# Patient Record
Sex: Female | Born: 1955 | Race: White | Hispanic: No | Smoking: Never smoker
Health system: Southern US, Community
[De-identification: ages and names within clinical notes are randomized; demographics above are authoritative.]

## PROBLEM LIST (undated history)

## (undated) DIAGNOSIS — R911 Solitary pulmonary nodule: Secondary | ICD-10-CM

## (undated) DIAGNOSIS — M48061 Spinal stenosis, lumbar region without neurogenic claudication: Secondary | ICD-10-CM

## (undated) DIAGNOSIS — F419 Anxiety disorder, unspecified: Secondary | ICD-10-CM

## (undated) DIAGNOSIS — Z8489 Family history of other specified conditions: Secondary | ICD-10-CM

## (undated) DIAGNOSIS — Z7982 Long term (current) use of aspirin: Secondary | ICD-10-CM

## (undated) DIAGNOSIS — I872 Venous insufficiency (chronic) (peripheral): Secondary | ICD-10-CM

## (undated) DIAGNOSIS — E559 Vitamin D deficiency, unspecified: Secondary | ICD-10-CM

## (undated) DIAGNOSIS — I8390 Asymptomatic varicose veins of unspecified lower extremity: Secondary | ICD-10-CM

## (undated) DIAGNOSIS — I700 Atherosclerosis of aorta: Secondary | ICD-10-CM

## (undated) DIAGNOSIS — L4050 Arthropathic psoriasis, unspecified: Secondary | ICD-10-CM

## (undated) DIAGNOSIS — K635 Polyp of colon: Secondary | ICD-10-CM

## (undated) DIAGNOSIS — K219 Gastro-esophageal reflux disease without esophagitis: Secondary | ICD-10-CM

## (undated) DIAGNOSIS — K8020 Calculus of gallbladder without cholecystitis without obstruction: Secondary | ICD-10-CM

## (undated) DIAGNOSIS — Z86018 Personal history of other benign neoplasm: Secondary | ICD-10-CM

## (undated) DIAGNOSIS — R1310 Dysphagia, unspecified: Secondary | ICD-10-CM

## (undated) DIAGNOSIS — M51369 Other intervertebral disc degeneration, lumbar region without mention of lumbar back pain or lower extremity pain: Secondary | ICD-10-CM

## (undated) DIAGNOSIS — L409 Psoriasis, unspecified: Secondary | ICD-10-CM

## (undated) DIAGNOSIS — Z7961 Long term (current) use of immunomodulator: Secondary | ICD-10-CM

## (undated) DIAGNOSIS — E785 Hyperlipidemia, unspecified: Secondary | ICD-10-CM

## (undated) DIAGNOSIS — M1990 Unspecified osteoarthritis, unspecified site: Secondary | ICD-10-CM

## (undated) DIAGNOSIS — I2510 Atherosclerotic heart disease of native coronary artery without angina pectoris: Secondary | ICD-10-CM

## (undated) DIAGNOSIS — R931 Abnormal findings on diagnostic imaging of heart and coronary circulation: Secondary | ICD-10-CM

## (undated) DIAGNOSIS — M519 Unspecified thoracic, thoracolumbar and lumbosacral intervertebral disc disorder: Secondary | ICD-10-CM

## (undated) HISTORY — DX: Psoriasis, unspecified: L40.9

## (undated) HISTORY — PX: FOOT SURGERY: SHX648

## (undated) HISTORY — DX: Personal history of other benign neoplasm: Z86.018

## (undated) HISTORY — DX: Gastro-esophageal reflux disease without esophagitis: K21.9

## (undated) HISTORY — PX: VARICOSE VEIN SURGERY: SHX832

## (undated) HISTORY — PX: COLONOSCOPY WITH ESOPHAGOGASTRODUODENOSCOPY (EGD): SHX5779

## (undated) HISTORY — DX: Polyp of colon: K63.5

## (undated) HISTORY — PX: SACROILIAC JOINT INJECTION: SHX2370

## (undated) HISTORY — PX: SIGMOIDOSCOPY: SUR1295

## (undated) HISTORY — PX: ABDOMINAL HYSTERECTOMY: SHX81

---

## 1995-04-09 HISTORY — PX: UTERINE FIBROID SURGERY: SHX826

## 1997-04-08 HISTORY — PX: BREAST SURGERY: SHX581

## 1999-06-18 ENCOUNTER — Encounter: Payer: Self-pay | Admitting: Family Medicine

## 1999-06-18 ENCOUNTER — Encounter: Admission: RE | Admit: 1999-06-18 | Discharge: 1999-06-18 | Payer: Self-pay | Admitting: Family Medicine

## 2000-09-02 ENCOUNTER — Encounter: Payer: Self-pay | Admitting: Obstetrics and Gynecology

## 2000-09-02 ENCOUNTER — Encounter: Admission: RE | Admit: 2000-09-02 | Discharge: 2000-09-02 | Payer: Self-pay | Admitting: Obstetrics and Gynecology

## 2000-09-08 ENCOUNTER — Encounter: Payer: Self-pay | Admitting: Obstetrics and Gynecology

## 2000-09-08 ENCOUNTER — Encounter: Admission: RE | Admit: 2000-09-08 | Discharge: 2000-09-08 | Payer: Self-pay | Admitting: Obstetrics and Gynecology

## 2001-09-09 ENCOUNTER — Encounter: Admission: RE | Admit: 2001-09-09 | Discharge: 2001-09-09 | Payer: Self-pay | Admitting: Obstetrics and Gynecology

## 2001-09-09 ENCOUNTER — Encounter: Payer: Self-pay | Admitting: Obstetrics and Gynecology

## 2002-10-05 ENCOUNTER — Encounter: Admission: RE | Admit: 2002-10-05 | Discharge: 2002-10-05 | Payer: Self-pay | Admitting: Obstetrics and Gynecology

## 2002-10-05 ENCOUNTER — Encounter: Payer: Self-pay | Admitting: Obstetrics and Gynecology

## 2003-04-09 HISTORY — PX: OTHER SURGICAL HISTORY: SHX169

## 2003-11-04 ENCOUNTER — Encounter: Admission: RE | Admit: 2003-11-04 | Discharge: 2003-11-04 | Payer: Self-pay | Admitting: Obstetrics and Gynecology

## 2004-10-25 ENCOUNTER — Ambulatory Visit: Payer: Self-pay | Admitting: Unknown Physician Specialty

## 2005-09-27 ENCOUNTER — Ambulatory Visit: Payer: Self-pay | Admitting: Internal Medicine

## 2010-06-18 ENCOUNTER — Ambulatory Visit: Payer: Self-pay | Admitting: Internal Medicine

## 2012-01-31 ENCOUNTER — Telehealth: Payer: Self-pay | Admitting: Internal Medicine

## 2012-01-31 NOTE — Telephone Encounter (Signed)
Dr. Scott called this into the pharmacy with 1 refill. °

## 2012-01-31 NOTE — Telephone Encounter (Signed)
Refill request for alprazolam odt 0.25 mg tab Sig: place 1 tablet under tongue every day as needed Patient has an appt on 04/28/12

## 2012-01-31 NOTE — Telephone Encounter (Signed)
Ok x 1

## 2012-02-04 ENCOUNTER — Other Ambulatory Visit: Payer: Self-pay | Admitting: *Deleted

## 2012-03-11 ENCOUNTER — Telehealth: Payer: Self-pay | Admitting: Internal Medicine

## 2012-03-11 NOTE — Telephone Encounter (Signed)
Pt is needing refill on Pantoprazole and she uses [facility] on [address]

## 2012-03-11 NOTE — Telephone Encounter (Signed)
Ok to refill.  protonix 40mg  until her appt but will need to clarify how she takes (either once or twice a day)

## 2012-03-12 MED ORDER — PANTOPRAZOLE SODIUM 40 MG PO TBEC: 40.0000 mg | DELAYED_RELEASE_TABLET | Freq: Every day | ORAL | Status: DC

## 2012-03-12 NOTE — Telephone Encounter (Signed)
Called for script verification;no answer

## 2012-03-12 NOTE — Telephone Encounter (Signed)
Called prescription in to pharmacy 

## 2012-03-12 NOTE — Addendum Note (Signed)
Addended by: Marlene Lard on: 03/12/2012 10:58 AM   Modules accepted: Orders

## 2012-04-28 ENCOUNTER — Encounter: Payer: Self-pay | Admitting: *Deleted

## 2012-04-28 ENCOUNTER — Encounter: Payer: Self-pay | Admitting: Internal Medicine

## 2012-04-28 ENCOUNTER — Ambulatory Visit (INDEPENDENT_AMBULATORY_CARE_PROVIDER_SITE_OTHER): Payer: BC Managed Care – PPO | Admitting: Internal Medicine

## 2012-04-28 VITALS — BP 118/70 | HR 66 | Temp 98.1°F | Ht 62.0 in | Wt 135.0 lb

## 2012-04-28 DIAGNOSIS — Z8619 Personal history of other infectious and parasitic diseases: Secondary | ICD-10-CM

## 2012-04-28 DIAGNOSIS — Z23 Encounter for immunization: Secondary | ICD-10-CM

## 2012-04-28 DIAGNOSIS — R5383 Other fatigue: Secondary | ICD-10-CM

## 2012-04-28 DIAGNOSIS — R5381 Other malaise: Secondary | ICD-10-CM

## 2012-04-28 DIAGNOSIS — K219 Gastro-esophageal reflux disease without esophagitis: Secondary | ICD-10-CM

## 2012-04-28 DIAGNOSIS — Z139 Encounter for screening, unspecified: Secondary | ICD-10-CM

## 2012-04-28 DIAGNOSIS — Z9109 Other allergy status, other than to drugs and biological substances: Secondary | ICD-10-CM

## 2012-04-28 MED ORDER — PANTOPRAZOLE SODIUM 40 MG PO TBEC: 40.0000 mg | DELAYED_RELEASE_TABLET | Freq: Every day | ORAL | Status: DC

## 2012-05-03 ENCOUNTER — Encounter: Payer: Self-pay | Admitting: Internal Medicine

## 2012-05-03 DIAGNOSIS — Z9109 Other allergy status, other than to drugs and biological substances: Secondary | ICD-10-CM | POA: Insufficient documentation

## 2012-05-03 DIAGNOSIS — K219 Gastro-esophageal reflux disease without esophagitis: Secondary | ICD-10-CM | POA: Insufficient documentation

## 2012-05-03 DIAGNOSIS — Z8619 Personal history of other infectious and parasitic diseases: Secondary | ICD-10-CM | POA: Insufficient documentation

## 2012-05-03 NOTE — Assessment & Plan Note (Signed)
Following with Dr Knowles- Jonas.  Up to date.  See above.    

## 2012-05-03 NOTE — Assessment & Plan Note (Signed)
On protonix.  Avoid foods that aggravate.  Keep follow up appt with Dr Andrey Campanile.

## 2012-05-03 NOTE — Progress Notes (Signed)
Subjective:    Patient ID: April Santos, female    DOB: [birth year], 57 y.o.   MRN: [id number]  HPI 57 year old female with past history of fibroid tumors s/p hysterectomy followed by Dr Toya Smothers. She also has a history of reoccurring allergy and sinus issues.  Seeing Dr  Callas.  Just had repeat patch testing in 8/13 - secondary to a flare.  Using eye drops, nasal spray and an antihistamine.  She continues to have some issues with her right knee.  Seeing Dr Zachery Dauer.  Taking Etodolac daily (1x/day).  Has a muscle relaxer which she takes as needed.  Had an injection over the summer.  Has follow up with Dr Andrey Campanile in 3/14.  She is on protonix.  Still has an occasional flare if she eats the wrong thing.  Bowels stable.    Past Medical History  Diagnosis Date  . GERD (gastroesophageal reflux disease)   . Colon polyps   . History of uterine fibroid     Current Outpatient Prescriptions on File Prior to Visit  Medication Sig Dispense Refill  . Calcium Citrate (CITRACAL PO) Take by mouth 4 (four) times daily.      Marland Kitchen estradiol (ESTRACE) 1 MG tablet Take 1 mg by mouth daily.      . fluticasone (FLONASE) 50 MCG/ACT nasal spray Place 2 sprays into the nose as needed.      Marland Kitchen levocetirizine (XYZAL) 5 MG tablet Take 5 mg by mouth every evening.      . Olopatadine HCl (PATANASE) 0.6 % SOLN Place into the nose as needed.      . pantoprazole (PROTONIX) 40 MG tablet Take 1 tablet (40 mg total) by mouth daily.  30 tablet  4    Review of Systems Patient denies any headache, lightheadedness or dizziness.  No significant sinus symptoms currently.  No chest pain, tightness or palpitations.  No increased shortness of breath, cough or congestion.  No nausea or vomiting.  Acid reflux if she eats the wrong thing.  On protonix.  No abdominal pain or cramping.  No bowel change, such as diarrhea, constipation, BRBPR or melana.  No urine change.        Objective:   Physical Exam Filed Vitals:   04/28/12 1328    BP: 118/70  Pulse: 66  Temp: 98.1 F (42.6 C)   57 year old female in no acute distress.   HEENT:  Nares- clear.  Oropharynx - without lesions. NECK:  Supple.  Nontender.  No audible bruit.  HEART:  Appears to be regular. LUNGS:  No crackles or wheezing audible.  Respirations even and unlabored.  RADIAL PULSE:  Equal bilaterally.  ABDOMEN:  Soft, nontender.  Bowel sounds present and normal.  No audible abdominal bruit.  EXTREMITIES:  No increased edema present.  DP pulses palpable and equal bilaterally.           Assessment & Plan:  GYN.  Sees Dr Toya Smothers.  Last evaluated two weeks ago.  Obtain records.    INCREASED PSYCHOSOCIAL STRESSORS. Doing well.  Follow.  Takes an occasional xanax.   GI.  Last colonoscopy 07/11/05.  Previous bowel issues.  See previous [facility] notes for details.  Due to follow up with Dr Andrey Campanile in 3/14.    MSK.  Knee pain as outlined.  Seeing Dr Zachery Dauer.    HEALTH MAINTENANCE.  Breast, pelvic and pap smears through Dr Toya Smothers office.  Obtain records.  Obtain mammo results.  Colonoscopy 07/11/05 - with  diverticulosis.  Has follow up already scheduled with Dr Andrey Campanile.

## 2012-05-03 NOTE — Assessment & Plan Note (Signed)
Seeing Dr Marietta Callas.  Continue current med regimen.

## 2012-05-07 ENCOUNTER — Other Ambulatory Visit: Payer: Self-pay | Admitting: *Deleted

## 2012-05-08 NOTE — Telephone Encounter (Signed)
Medication has already been sent in. 

## 2012-05-12 ENCOUNTER — Other Ambulatory Visit: Payer: BC Managed Care – PPO

## 2012-06-12 ENCOUNTER — Other Ambulatory Visit: Payer: BC Managed Care – PPO

## 2012-06-17 ENCOUNTER — Telehealth: Payer: Self-pay | Admitting: *Deleted

## 2012-06-17 NOTE — Telephone Encounter (Signed)
Called [phone] for prior authorization for the Pantoprazole 40 mg, form is being faxed over now

## 2012-06-25 ENCOUNTER — Telehealth: Payer: Self-pay | Admitting: *Deleted

## 2012-06-25 NOTE — Telephone Encounter (Signed)
April Santos called the office and she said her insurance needs a prior auth to cover the Protonix 40 mg

## 2012-06-26 ENCOUNTER — Other Ambulatory Visit: Payer: Self-pay | Admitting: *Deleted

## 2012-07-31 ENCOUNTER — Telehealth: Payer: Self-pay | Admitting: *Deleted

## 2012-07-31 NOTE — Telephone Encounter (Signed)
[place] for [id number]

## 2012-07-31 NOTE — Telephone Encounter (Signed)
Called [phone] for prior authorization in the Pantoprazole 40 mg, form is being faxed over now

## 2012-08-03 ENCOUNTER — Telehealth: Payer: Self-pay | Admitting: *Deleted

## 2012-08-03 NOTE — Telephone Encounter (Signed)
Prior authorization criteria question have been faxed over

## 2012-08-28 ENCOUNTER — Other Ambulatory Visit: Payer: Self-pay | Admitting: *Deleted

## 2012-08-28 ENCOUNTER — Telehealth: Payer: Self-pay | Admitting: Internal Medicine

## 2012-08-28 MED ORDER — ALPRAZOLAM 0.25 MG PO TABS: 0.2500 mg | ORAL_TABLET | Freq: Every evening | ORAL | Status: DC | PRN

## 2012-08-28 NOTE — Telephone Encounter (Signed)
Okay to refill? 

## 2012-08-28 NOTE — Telephone Encounter (Signed)
Refilled Xanax x 1 per Dr. Lorin Picket

## 2012-08-28 NOTE — Telephone Encounter (Signed)
ALPRAZolam (XANAX) 0.25 MG tablet #30

## 2012-08-28 NOTE — Telephone Encounter (Signed)
Patient wanting to know if the insurance company will allow her Pantoprazole 40 mg to be filled or is there some other medication she can take in its place.

## 2012-08-28 NOTE — Telephone Encounter (Signed)
Ok x 1

## 2012-09-16 ENCOUNTER — Telehealth: Payer: Self-pay | Admitting: *Deleted

## 2012-09-16 NOTE — Telephone Encounter (Signed)
Spoke with pt re: Pantoprazole 40mg . She has been paying for the medication out of pocket. Looks like we were working on a prior authorization for her and haven't heard anything back. I mentioned to the patient that she may have to try Omeprazole 40 mg first since she states that she has never tried it before. Please Advise.  Note: Pt now uses [facility]

## 2012-09-17 ENCOUNTER — Other Ambulatory Visit: Payer: BC Managed Care – PPO

## 2012-09-17 NOTE — Telephone Encounter (Signed)
Do you know if they are going to cover her protonix.  Have they sent back a reason - if they are denying.  Does she have to try something else?   Just let me know.  Thanks.

## 2012-10-26 ENCOUNTER — Other Ambulatory Visit: Payer: BC Managed Care – PPO

## 2012-10-27 ENCOUNTER — Other Ambulatory Visit: Payer: BC Managed Care – PPO

## 2012-10-27 ENCOUNTER — Ambulatory Visit: Payer: BC Managed Care – PPO | Admitting: Internal Medicine

## 2012-11-23 ENCOUNTER — Encounter: Payer: Self-pay | Admitting: Internal Medicine

## 2012-11-24 ENCOUNTER — Ambulatory Visit (INDEPENDENT_AMBULATORY_CARE_PROVIDER_SITE_OTHER): Payer: BC Managed Care – PPO | Admitting: Internal Medicine

## 2012-11-24 ENCOUNTER — Encounter: Payer: Self-pay | Admitting: Internal Medicine

## 2012-11-24 ENCOUNTER — Other Ambulatory Visit (INDEPENDENT_AMBULATORY_CARE_PROVIDER_SITE_OTHER): Payer: BC Managed Care – PPO

## 2012-11-24 VITALS — BP 100/60 | HR 55 | Temp 98.1°F | Ht 62.0 in | Wt 132.5 lb

## 2012-11-24 DIAGNOSIS — G5600 Carpal tunnel syndrome, unspecified upper limb: Secondary | ICD-10-CM

## 2012-11-24 DIAGNOSIS — Z8619 Personal history of other infectious and parasitic diseases: Secondary | ICD-10-CM

## 2012-11-24 DIAGNOSIS — Z139 Encounter for screening, unspecified: Secondary | ICD-10-CM

## 2012-11-24 DIAGNOSIS — K219 Gastro-esophageal reflux disease without esophagitis: Secondary | ICD-10-CM

## 2012-11-24 DIAGNOSIS — R5381 Other malaise: Secondary | ICD-10-CM

## 2012-11-24 DIAGNOSIS — Z9109 Other allergy status, other than to drugs and biological substances: Secondary | ICD-10-CM

## 2012-11-24 DIAGNOSIS — R5383 Other fatigue: Secondary | ICD-10-CM

## 2012-11-24 LAB — COMPREHENSIVE METABOLIC PANEL
ALT: 13 U/L (ref 0–35)
AST: 21 U/L (ref 0–37)
CO2: 26 mEq/L (ref 19–32)
Calcium: 9 mg/dL (ref 8.4–10.5)
Chloride: 109 mEq/L (ref 96–112)
Creatinine, Ser: 0.8 mg/dL (ref 0.4–1.2)
GFR: 78.17 mL/min (ref >60.00)
Potassium: 4.1 mEq/L (ref 3.5–5.1)
Sodium: 140 mEq/L (ref 135–145)
Total Protein: 6.7 g/dL (ref 6.0–8.3)

## 2012-11-24 LAB — CBC WITH DIFFERENTIAL/PLATELET
Basophils Absolute: 0 10*3/uL (ref 0.0–0.1)
Eosinophils Absolute: 0 10*3/uL (ref 0.0–0.7)
Hemoglobin: 11.4 g/dL — ABNORMAL LOW (ref 12.0–15.0)
Lymphocytes Relative: 42.8 % (ref 12.0–46.0)
MCHC: 33.5 g/dL (ref 30.0–36.0)
Monocytes Relative: 10.5 % (ref 3.0–12.0)
Neutrophils Relative %: 45.3 % (ref 43.0–77.0)
Platelets: 279 10*3/uL (ref 150.0–400.0)
RDW: 13.4 % (ref 11.5–14.6)

## 2012-11-24 LAB — LIPID PANEL
LDL Cholesterol: 83 mg/dL (ref 0–99)
Total CHOL/HDL Ratio: 3
Triglycerides: 55 mg/dL (ref 0.0–149.0)

## 2012-11-24 MED ORDER — CYCLOBENZAPRINE HCL 5 MG PO TABS: 5.0000 mg | ORAL_TABLET | Freq: Every evening | ORAL | Status: DC | PRN

## 2012-11-24 NOTE — Progress Notes (Signed)
Subjective:    Patient ID: April Santos, female    DOB: 1956/02/12, 57 y.o.   MRN: [id number]  HPI 57 year old female with past history of fibroid tumors s/p hysterectomy followed by Dr Toya Smothers. She also has a history of reoccurring allergy and sinus issues.  Seeing Dr Riverlea Callas.  Just had repeat patch testing in 8/13 - secondary to a flare.  Uses a nasal spray and an antihistamine.  She has also noticed some itching and dryness/rash - hands/knuckles.  Has tried multiple creams.  Planning to se Dr Kerr Callas today to discuss.  She continues to have some issues with her right knee, but is better.  Was  seeing Dr Zachery Dauer.  Taking Etodolac daily, usually only needs one every other week.  Has a muscle relaxer which she takes as needed.  Had an injection over the summer.  Again, better.  She is on protonix.  Still has an occasional flare if she eats the wrong thing.  Bowels stable.  Was due to follow up with Dr Andrey Campanile this spring.  She had to change her appt (secondary to work).  Reports would be closer for her to be evaluated in [place].  She rides her bike five days per week.  Breathing stable.  She has noticed some left hand numbness.  Has a splint.  Helps.  Has required carpal tunnel surgery on her right hand.  States was told eventually would need on her left.      Past Medical History  Diagnosis Date  . GERD (gastroesophageal reflux disease)   . Colon polyps   . History of uterine fibroid     Current Outpatient Prescriptions on File Prior to Visit  Medication Sig Dispense Refill  . ALPRAZolam (XANAX) 0.25 MG tablet Take 1 tablet (0.25 mg total) by mouth at bedtime as needed.  30 tablet  0  . Bepotastine Besilate (BEPREVE) 1.5 % SOLN as needed.      . cyclobenzaprine (FLEXERIL) 5 MG tablet Take 5 mg by mouth at bedtime as needed.       Marland Kitchen estradiol (ESTRACE) 0.1 MG/GM vaginal cream Place 2 g vaginally 2 (two) times a week.      . estradiol (ESTRACE) 1 MG tablet Take 1 mg by mouth daily.      Marland Kitchen  etodolac (LODINE) 500 MG tablet Take 500 mg by mouth 2 (two) times daily as needed.      . fluticasone (FLONASE) 50 MCG/ACT nasal spray Place 2 sprays into the nose as needed.      . hydrocortisone 2.5 % cream Apply topically as needed.      . Hydrocortisone Ace-Pramoxine 2.35-1 % KIT Place rectally.      Marland Kitchen levocetirizine (XYZAL) 5 MG tablet Take 5 mg by mouth every evening.      . Multiple Vitamins-Minerals (MULTIVITAL PO) Take by mouth daily.      . Olopatadine HCl (PATANASE) 0.6 % SOLN Place into the nose as needed.      . pantoprazole (PROTONIX) 40 MG tablet Take 1 tablet (40 mg total) by mouth daily.  30 tablet  4  . Calcium Citrate (CITRACAL PO) Take by mouth 4 (four) times daily.       No current facility-administered medications on file prior to visit.    Review of Systems Patient denies any headache, lightheadedness or dizziness.  No significant sinus symptoms currently.  No chest pain, tightness or palpitations.  No increased shortness of breath, cough or congestion.  No nausea  or vomiting.  Acid reflux if she eats the wrong thing.  On protonix.  No abdominal pain or cramping.  No bowel change, such as diarrhea, constipation, BRBPR or melana.  No urine change.  Itching and rash (knuckles) as outlined.  Knee better.       Objective:   Physical Exam  Filed Vitals:   11/24/12 0824  BP: 100/60  Pulse: 55  Temp: 98.1 F (11.11 C)   57 year old female in no acute distress.   HEENT:  Nares- clear.  Oropharynx - without lesions. NECK:  Supple.  Nontender.  No audible bruit.  HEART:  Appears to be regular. LUNGS:  No crackles or wheezing audible.  Respirations even and unlabored.  RADIAL PULSE:  Equal bilaterally.  ABDOMEN:  Soft, nontender.  Bowel sounds present and normal.  No audible abdominal bruit.  EXTREMITIES:  No increased edema present.  DP pulses palpable and equal bilaterally.   MSK:  Good rom - knee.  No increased swelling.   SKIN:  Minimal erythema and dryness -  knuckles.           Assessment & Plan:  GYN.  Sees Dr Toya Smothers.  Up to date.    INCREASED PSYCHOSOCIAL STRESSORS. Doing well.  Follow.  Takes an occasional xanax.   GI.  Last colonoscopy 07/11/05.  Previous bowel issues.  See previous [facility] notes for details.  See above. Prefers to be seen in [place].  Will schedule with GI.  Referral made.    MSK.  Knee better.  See above.     HEALTH MAINTENANCE.  Breast, pelvic and pap smears through Dr Toya Smothers office.  Obtain records.  Mammogram 04/10/12 - Birads I.  Colonoscopy 07/11/05 - with diverticulosis.  Refer to GI as outlined.

## 2012-11-24 NOTE — Assessment & Plan Note (Addendum)
On protonix.  Avoid foods that aggravate.  Refer to GI in [place].  See notes.

## 2012-11-24 NOTE — Assessment & Plan Note (Addendum)
Seeing Dr Rutland Callas.  Continue current med regimen.  Having the rash and itching as outlined.  Seeing Dr [place] Callas today.  Has already tried various creams (including steroid creams) and antihistamines.

## 2012-11-24 NOTE — Assessment & Plan Note (Signed)
Following with Dr Knowles- Jonas.  Up to date.  See above.    

## 2012-11-25 LAB — VITAMIN D 25 HYDROXY (VIT D DEFICIENCY, FRACTURES): Vit D, 25-Hydroxy: 45 ng/mL (ref 30–89)

## 2012-11-26 ENCOUNTER — Other Ambulatory Visit: Payer: Self-pay | Admitting: Internal Medicine

## 2012-11-26 ENCOUNTER — Encounter: Payer: Self-pay | Admitting: Internal Medicine

## 2012-11-26 ENCOUNTER — Encounter: Payer: Self-pay | Admitting: *Deleted

## 2012-11-26 DIAGNOSIS — D649 Anemia, unspecified: Secondary | ICD-10-CM

## 2012-11-26 DIAGNOSIS — G5600 Carpal tunnel syndrome, unspecified upper limb: Secondary | ICD-10-CM | POA: Insufficient documentation

## 2012-11-26 NOTE — Assessment & Plan Note (Signed)
Is s/p surgery on right.  Has done well.  Numbness in left hand - mostly noticed when gripping (riding her bike).  Splint helps.  Will wear on a more regular schedule.  Follow.  Will notify me if desires any further intervention.

## 2012-11-26 NOTE — Progress Notes (Signed)
Orders placed for f/u las.   

## 2012-12-14 ENCOUNTER — Encounter: Payer: Self-pay | Admitting: Internal Medicine

## 2012-12-15 ENCOUNTER — Other Ambulatory Visit: Payer: BC Managed Care – PPO

## 2013-01-06 ENCOUNTER — Other Ambulatory Visit (INDEPENDENT_AMBULATORY_CARE_PROVIDER_SITE_OTHER): Payer: BC Managed Care – PPO

## 2013-01-06 DIAGNOSIS — D649 Anemia, unspecified: Secondary | ICD-10-CM

## 2013-01-06 LAB — CBC WITH DIFFERENTIAL/PLATELET
Basophils Absolute: 0 10*3/uL (ref 0.0–0.1)
Basophils Relative: 0.4 % (ref 0.0–3.0)
Eosinophils Absolute: 0 10*3/uL (ref 0.0–0.7)
MCHC: 33.6 g/dL (ref 30.0–36.0)
MCV: 83.4 fl (ref 78.0–100.0)
Monocytes Absolute: 0.4 10*3/uL (ref 0.1–1.0)
Neutrophils Relative %: 52 % (ref 43.0–77.0)
RBC: 4.48 Mil/uL (ref 3.87–5.11)
RDW: 14.3 % (ref 11.5–14.6)

## 2013-01-07 LAB — IBC PANEL: Iron: 97 ug/dL (ref 42–145)

## 2013-01-11 ENCOUNTER — Encounter: Payer: Self-pay | Admitting: *Deleted

## 2013-01-22 ENCOUNTER — Telehealth: Payer: Self-pay | Admitting: Internal Medicine

## 2013-01-22 NOTE — Telephone Encounter (Signed)
noted 

## 2013-01-22 NOTE — Telephone Encounter (Signed)
Patient has seen Dr. Loreta Ave, GI. She states she was s/u for a colonoscopy, but they are expecting 700.00 up front. They will not offer a payment plan, pt is willing to r/s this apt if they can r/s to Jan., to give her time to gather the money. Patient is wondering if she should wait for colonoscopy in Jan., or should there be something else she should do in the meantime. Pt is wondering if maybe she should change GI doctors as well. Please advise.

## 2013-01-22 NOTE — Telephone Encounter (Signed)
Patient called back and stated she has gotten some clarification on why its costing so much. They are doing a colon and EGD as well. She is r/s with their office towards the end of Jan. No need to call her back.

## 2013-02-02 ENCOUNTER — Ambulatory Visit: Payer: BC Managed Care – PPO

## 2013-02-02 DIAGNOSIS — Z23 Encounter for immunization: Secondary | ICD-10-CM

## 2013-02-10 ENCOUNTER — Other Ambulatory Visit: Payer: Self-pay | Admitting: *Deleted

## 2013-02-10 MED ORDER — ALPRAZOLAM 0.25 MG PO TABS: 0.2500 mg | ORAL_TABLET | Freq: Every evening | ORAL | Status: DC | PRN

## 2013-02-10 NOTE — Telephone Encounter (Signed)
Refilled xanax #30 with no refills.   

## 2013-02-12 ENCOUNTER — Other Ambulatory Visit: Payer: Self-pay | Admitting: *Deleted

## 2013-02-12 MED ORDER — CYCLOBENZAPRINE HCL 5 MG PO TABS: 5.0000 mg | ORAL_TABLET | Freq: Every evening | ORAL | Status: DC | PRN

## 2013-02-12 NOTE — Telephone Encounter (Signed)
Refilled flexeril #30 with no refills.   

## 2013-02-12 NOTE — Telephone Encounter (Signed)
Refill

## 2013-04-05 ENCOUNTER — Other Ambulatory Visit: Payer: Self-pay | Admitting: *Deleted

## 2013-04-05 MED ORDER — PANTOPRAZOLE SODIUM 40 MG PO TBEC: 40.0000 mg | DELAYED_RELEASE_TABLET | Freq: Every day | ORAL | Status: DC

## 2013-04-30 ENCOUNTER — Encounter: Payer: Self-pay | Admitting: Internal Medicine

## 2013-05-04 LAB — HM DEXA SCAN

## 2013-05-11 LAB — HM MAMMOGRAPHY

## 2013-05-14 ENCOUNTER — Encounter: Payer: Self-pay | Admitting: Internal Medicine

## 2013-05-14 DIAGNOSIS — M8580 Other specified disorders of bone density and structure, unspecified site: Secondary | ICD-10-CM | POA: Insufficient documentation

## 2013-05-31 ENCOUNTER — Ambulatory Visit: Payer: BC Managed Care – PPO | Admitting: Internal Medicine

## 2013-06-08 ENCOUNTER — Encounter: Payer: Self-pay | Admitting: Internal Medicine

## 2013-06-08 ENCOUNTER — Ambulatory Visit (INDEPENDENT_AMBULATORY_CARE_PROVIDER_SITE_OTHER): Payer: BC Managed Care – PPO | Admitting: Internal Medicine

## 2013-06-08 VITALS — BP 100/66 | HR 63 | Temp 98.3°F | Resp 12 | Ht 62.0 in | Wt 136.5 lb

## 2013-06-08 DIAGNOSIS — K219 Gastro-esophageal reflux disease without esophagitis: Secondary | ICD-10-CM

## 2013-06-08 DIAGNOSIS — Z8619 Personal history of other infectious and parasitic diseases: Secondary | ICD-10-CM

## 2013-06-08 DIAGNOSIS — B351 Tinea unguium: Secondary | ICD-10-CM

## 2013-06-08 DIAGNOSIS — R928 Other abnormal and inconclusive findings on diagnostic imaging of breast: Secondary | ICD-10-CM

## 2013-06-08 DIAGNOSIS — Z9109 Other allergy status, other than to drugs and biological substances: Secondary | ICD-10-CM

## 2013-06-08 NOTE — Progress Notes (Signed)
Pre visit review using our clinic review tool, if applicable. No additional management support is needed unless otherwise documented below in the visit note. 

## 2013-06-09 ENCOUNTER — Encounter: Payer: Self-pay | Admitting: Internal Medicine

## 2013-06-12 ENCOUNTER — Encounter: Payer: Self-pay | Admitting: Internal Medicine

## 2013-06-12 DIAGNOSIS — R928 Other abnormal and inconclusive findings on diagnostic imaging of breast: Secondary | ICD-10-CM | POA: Insufficient documentation

## 2013-06-12 DIAGNOSIS — B351 Tinea unguium: Secondary | ICD-10-CM | POA: Insufficient documentation

## 2013-06-12 NOTE — Progress Notes (Signed)
Subjective:    Patient ID: April Santos, female    DOB: 08/28/1955, 58 y.o.   MRN: [id number]  HPI 58 year old female with past history of fibroid tumors s/p hysterectomy followed by Dr Georga Bora. She also has a history of reoccurring allergy and sinus issues.  Seeing Dr Donneta Romberg.  Bowels stable. Needs colonoscopy.   She rides her bike five days per week.  Breathing stable.  No chest pain or tightness with increased activity or exertion.  She recently had an abnormal mammogram.  After discussion, the radiologist recommended a 6 month f/u mammogram.  With her family history, she prefers to have another opinion.  Also, had questions about her great toe nail.  Has fungus.  dscussed treatment options.     Past Medical History  Diagnosis Date  . GERD (gastroesophageal reflux disease)   . Colon polyps   . History of uterine fibroid     Current Outpatient Prescriptions on File Prior to Visit  Medication Sig Dispense Refill  . ALPRAZolam (XANAX) 0.25 MG tablet Take 1 tablet (0.25 mg total) by mouth at bedtime as needed.  30 tablet  0  . Bepotastine Besilate (BEPREVE) 1.5 % SOLN as needed.      . Calcium Citrate (CITRACAL PO) Take by mouth 4 (four) times daily.      . cyclobenzaprine (FLEXERIL) 5 MG tablet Take 1 tablet (5 mg total) by mouth at bedtime as needed.  30 tablet  0  . estradiol (ESTRACE) 0.1 MG/GM vaginal cream Place 2 g vaginally 2 (two) times a week.      . estradiol (ESTRACE) 1 MG tablet Take 1 mg by mouth daily.      Marland Kitchen etodolac (LODINE) 500 MG tablet Take 500 mg by mouth 2 (two) times daily as needed.      . fluticasone (FLONASE) 50 MCG/ACT nasal spray Place 2 sprays into the nose as needed.      . Hydrocortisone Ace-Pramoxine 2.35-1 % KIT Place rectally.      Marland Kitchen levocetirizine (XYZAL) 5 MG tablet Take 5 mg by mouth every evening.      . Multiple Vitamins-Minerals (MULTIVITAL PO) Take by mouth daily.      . pantoprazole (PROTONIX) 40 MG tablet Take 1 tablet (40 mg total) by  mouth daily.  30 tablet  2  . hydrocortisone 2.5 % cream Apply topically as needed.      . Olopatadine HCl (PATANASE) 0.6 % SOLN Place into the nose as needed.       No current facility-administered medications on file prior to visit.    Review of Systems Patient denies any headache, lightheadedness or dizziness.  No significant sinus symptoms currently.  No chest pain, tightness or palpitations.  No increased shortness of breath, cough or congestion.  No nausea or vomiting.  Acid reflux if she eats the wrong thing.  On protonix.  Overall controlled if watches her diet.  No abdominal pain or cramping.  No bowel change, such as diarrhea, constipation, BRBPR or melana.  No urine change.       Objective:   Physical Exam  Filed Vitals:   06/08/13 1153  BP: 100/66  Pulse: 63  Temp: 98.3 F (36.8 C)  Resp: 54   58 year old female in no acute distress.   HEENT:  Nares- clear.  Oropharynx - without lesions. NECK:  Supple.  Nontender.  No audible bruit.  HEART:  Appears to be regular. LUNGS:  No crackles or wheezing audible.  Respirations even and unlabored.  RADIAL PULSE:  Equal bilaterally.  ABDOMEN:  Soft, nontender.  Bowel sounds present and normal.  No audible abdominal bruit.  EXTREMITIES:  No increased edema present.  DP pulses palpable and equal bilaterally.          Assessment & Plan:  GYN.  Sees Dr Georga Bora.  Up to date.    INCREASED PSYCHOSOCIAL STRESSORS. Doing well.  Follow.  Takes an occasional xanax.   GI.  Last colonoscopy 07/11/05.  Previous bowel issues.  See previous [facility] notes for details.  See above.  Due follow up colonoscopy.  Needs to schedule.  Will let me know if agreeable.    MSK.  Knee better.  See above.     HEALTH MAINTENANCE.  Breast, pelvic and pap smears through Dr Georga Bora office.  Obtain records.  Colonoscopy 07/11/05 - with diverticulosis.  Needs colonoscopy as outlined.  Recent mammogram recommended f/u views.  She prefers a second opinion.   Will refer to Dr Bary Castilla for review and question of need for biopsy.

## 2013-06-12 NOTE — Assessment & Plan Note (Signed)
Left great toe nail fungus.  Refer to Dr Cleda Mccreedy for evaluation and treatment.

## 2013-06-12 NOTE — Assessment & Plan Note (Signed)
Following with Dr Bayard MalesFredonia Highland.  Up to date.  See above.

## 2013-06-12 NOTE — Assessment & Plan Note (Signed)
States had abnormal mammogram.  After discussion with radiology, it was decided to send her for a f/u mammogram in 6 months.  After discussion, she preferred to be referred for a second opinion.  Will refer to Dr Bary Castilla.  Obtain mammo results.

## 2013-06-12 NOTE — Assessment & Plan Note (Signed)
Sees Dr Sharma.  Stable.    

## 2013-06-12 NOTE — Assessment & Plan Note (Signed)
On protonix.  Avoid foods that aggravate.    

## 2013-06-30 ENCOUNTER — Ambulatory Visit: Payer: Self-pay | Admitting: General Surgery

## 2013-07-14 ENCOUNTER — Other Ambulatory Visit: Payer: Self-pay | Admitting: Internal Medicine

## 2013-07-14 ENCOUNTER — Telehealth: Payer: Self-pay | Admitting: Internal Medicine

## 2013-07-14 NOTE — Telephone Encounter (Signed)
Pt came into office to make an appt with Dr. Nicki Reaper for right knee pain.  States she has seen Dr. Nicki Reaper for this before and was referred to [facility].  States the doctor is no longer practicing at [facility].  Advised pt no upcoming appt available with Dr. Nicki Reaper.  Pt prefers to see Dr. Nicki Reaper only.  Please advise if pt can be seen.  States she is having a lot of trouble with her knee.  Etodolac refill needed.  Pt states she believes Dr. Drema Dallas or Dr. Jens Som (not sure which) previously prescribed this.

## 2013-07-15 ENCOUNTER — Other Ambulatory Visit: Payer: Self-pay | Admitting: Internal Medicine

## 2013-07-15 DIAGNOSIS — M25561 Pain in right knee: Secondary | ICD-10-CM

## 2013-07-15 NOTE — Telephone Encounter (Signed)
Please advise 

## 2013-07-15 NOTE — Progress Notes (Signed)
Order placed for ortho referral.   

## 2013-07-15 NOTE — Telephone Encounter (Signed)
I am ok to refill the etodolac 500mg  bid prn #30 with no refills.  Needs to monitor for GI side effects.  Regarding the appt, if she is continuing to have knee problems - I can schedule her an appt to f/u with ortho.  I do not mind seeing her, but if persistent problems, will probably need ortho referral.  (this may save her from having two appts).  Let me know if agreeable and I will place an order for ortho referral.

## 2013-07-15 NOTE — Telephone Encounter (Signed)
Is there any way to get her in to see ortho 07/16/13.  See her phone message.  I will place the order for the referral.  This will allow her to avoid two visits.  Let me know if a problem.

## 2013-07-15 NOTE — Telephone Encounter (Signed)
Pt came in to office wanting to go ahead and make appt.  States she will see anyone tomorrow.  States the pain is now radiating towards her hip.  States the etodolac is not helping at all at this point.  She is still okay with going to orthopedist but feels that this needs to be checked out asap.  Pt can come anytime 4/10 afternoon if at all possible.  No appt available at this time with any provider.  Advised pt to call to check for cancellations on 4/10.  Please advise if Dr. Nicki Reaper is able to see pt.

## 2013-07-15 NOTE — Telephone Encounter (Signed)
OK to fill

## 2013-07-15 NOTE — Telephone Encounter (Signed)
Has seen Dr. Leanor Kail for surgery on her hand.  Thinks he was in with the same group as Dr. Drema Dallas.  Pt states she will go straight to Orthopedics and would like an appt asap.  Pt states the pain is worse today than it was yesterday and she would need to be seen soon.  Pt can go anytime except for 4/10 morning.  Can do 4/10 afternoon If possible.

## 2013-07-16 ENCOUNTER — Ambulatory Visit (INDEPENDENT_AMBULATORY_CARE_PROVIDER_SITE_OTHER): Payer: BC Managed Care – PPO | Admitting: Internal Medicine

## 2013-07-16 ENCOUNTER — Encounter: Payer: Self-pay | Admitting: Internal Medicine

## 2013-07-16 ENCOUNTER — Ambulatory Visit: Payer: BC Managed Care – PPO | Admitting: Internal Medicine

## 2013-07-16 VITALS — BP 110/64 | HR 70 | Temp 98.5°F | Wt 136.8 lb

## 2013-07-16 DIAGNOSIS — M25561 Pain in right knee: Secondary | ICD-10-CM

## 2013-07-16 DIAGNOSIS — M25569 Pain in unspecified knee: Secondary | ICD-10-CM

## 2013-07-16 MED ORDER — HYDROCODONE-ACETAMINOPHEN 5-325 MG PO TABS: ORAL_TABLET | ORAL | Status: DC

## 2013-07-16 NOTE — Telephone Encounter (Signed)
[facility] with [facility] is sending a mess back to see if they can see the patient today. Dr. Marry Guan, Jenny Reichmann and Rudene Christians are all out of the office today. They will give [place] a call back

## 2013-07-16 NOTE — Telephone Encounter (Signed)
Spoke with Aldona Bar with [facility], was told that they are unable to work the pt into their schedule today and the soonest available appt is on Tuesday 07/20/13.

## 2013-07-16 NOTE — Telephone Encounter (Signed)
Noted  

## 2013-07-16 NOTE — Telephone Encounter (Signed)
Pt scheduled to be seen today with Dr. Nicki Reaper.

## 2013-07-16 NOTE — Telephone Encounter (Signed)
Noted.  Just let me know.  Thanks.

## 2013-07-16 NOTE — Progress Notes (Signed)
Pre visit review using our clinic review tool, if applicable. No additional management support is needed unless otherwise documented below in the visit note. 

## 2013-07-17 NOTE — Telephone Encounter (Signed)
Saw pt on Friday 07/16/13.  Already has been refilled.

## 2013-07-19 ENCOUNTER — Telehealth: Payer: Self-pay | Admitting: Internal Medicine

## 2013-07-19 MED ORDER — ETODOLAC 500 MG PO TABS: 500.0000 mg | ORAL_TABLET | Freq: Two times a day (BID) | ORAL | Status: DC | PRN

## 2013-07-19 NOTE — Telephone Encounter (Signed)
Please advise 

## 2013-07-19 NOTE — Telephone Encounter (Signed)
Pt states she is returning call.  Advised of referral to [facility] appt details.  Pt also states she tried the pain medication Dr. Nicki Reaper prescribed on 4/10.  States it caused a lot of stomach upset over the weekend.  Pt states she prefers to go back on etodolac until she sees orthopedist.  Asking if etodolac can be called to [place].

## 2013-07-19 NOTE — Telephone Encounter (Signed)
Refilled etodolac #40 with no refills.  Sent rx to pharmacy.    Amber, were you able to get that appt for her with Rachelle Hora.

## 2013-07-19 NOTE — Telephone Encounter (Signed)
Pt has an apt on 07/22/13 with [facility]. I have left a message for the pt to call me.

## 2013-07-19 NOTE — Telephone Encounter (Signed)
Thanks

## 2013-07-20 ENCOUNTER — Encounter: Payer: Self-pay | Admitting: Internal Medicine

## 2013-07-20 DIAGNOSIS — M25561 Pain in right knee: Secondary | ICD-10-CM | POA: Insufficient documentation

## 2013-07-20 NOTE — Assessment & Plan Note (Signed)
Describes knee pain.  Pain appears to be more localized in the right lower buttock and then extends down the leg.  No pain with straight leg raise.  No pain with abduction/adduction.  Has lodine and flexeril.  Not helping.  Will give her Norco 5/325 to take as directed.  Refer to ortho for evaluation.

## 2013-07-20 NOTE — Progress Notes (Signed)
Subjective:    Patient ID: April Santos, female    DOB: 11-21-1955, 58 y.o.   MRN: [id number]  Knee Pain   58 year old female with past history of fibroid tumors s/p hysterectomy followed by Dr Toya Smothers. She also has a history of reoccurring allergy and sinus issues.  She comes in today as a work in with concerns regarding right knee pain. States that she noticed increased knee pain six day ago.  Then progressed up her leg.  States if she propped up her leg with leg stretched out - better.  Hurts worse if she presses her foot on the gas pedal.  Pain has now moved up to her right hip.  Increased pain.  Taking lodine and flexeril.  No relief.  Has taken hydrocodone previously and tolerated.  No known injury.  The only new recent activity was pushing her mother around in a wheelchair.     Past Medical History  Diagnosis Date  . GERD (gastroesophageal reflux disease)   . Colon polyps   . History of uterine fibroid     Current Outpatient Prescriptions on File Prior to Visit  Medication Sig Dispense Refill  . ALPRAZolam (XANAX) 0.25 MG tablet Take 1 tablet (0.25 mg total) by mouth at bedtime as needed.  30 tablet  0  . Bepotastine Besilate (BEPREVE) 1.5 % SOLN as needed.      . Calcium Citrate (CITRACAL PO) Take by mouth 4 (four) times daily.      . cyclobenzaprine (FLEXERIL) 5 MG tablet Take 1 tablet (5 mg total) by mouth at bedtime as needed.  30 tablet  0  . estradiol (ESTRACE) 0.1 MG/GM vaginal cream Place 2 g vaginally 2 (two) times a week.      . estradiol (ESTRACE) 1 MG tablet Take 1 mg by mouth daily.      . hydrocortisone 2.5 % cream Apply topically as needed.      . Hydrocortisone Ace-Pramoxine 2.35-1 % KIT Place rectally.      . hydrOXYzine (ATARAX/VISTARIL) 25 MG tablet Take 25 mg by mouth 3 (three) times daily.      Marland Kitchen levocetirizine (XYZAL) 5 MG tablet Take 5 mg by mouth every evening.      . montelukast (SINGULAIR) 10 MG tablet Take 10 mg by mouth at bedtime.       .  Multiple Vitamins-Minerals (MULTIVITAL PO) Take by mouth daily.      . Olopatadine HCl (PATANASE) 0.6 % SOLN Place into the nose as needed.      . pantoprazole (PROTONIX) 40 MG tablet Take 1 tablet (40 mg total) by mouth daily.  30 tablet  2   No current facility-administered medications on file prior to visit.    Review of Systems No increased shortness of breath, cough or congestion.  On protonix.  Knee pain as outlined.  Increased pain.  Pain extends up the leg.  No known injury.        Objective:   Physical Exam  Filed Vitals:   07/16/13 1609  BP: 110/64  Pulse: 70  Temp: 98.5 F (4.24 C)   58 year old female in no acute distress.  EXTREMITIES:  No increased edema present.  DP pulses palpable and equal bilaterally.  MSK:  Pain improves with full extension of her leg.  No increased erythema or swelling.  No pain with abduction/adduction of the hip.    No pain with straight leg raise.  Some increased pain - right lower buttock and  extends down the leg.         Assessment & Plan:  HEALTH MAINTENANCE.  Breast, pelvic and pap smears through Dr Georga Bora office.  Obtain records.  Colonoscopy 07/11/05 - with diverticulosis.  Needs colonoscopy as outlined.  Recent mammogram recommended f/u views.  She prefers a second opinion.  Was referred to Dr Bary Castilla for review and question of need for biopsy.

## 2013-08-02 ENCOUNTER — Telehealth: Payer: Self-pay | Admitting: Internal Medicine

## 2013-08-02 MED ORDER — HYDROCODONE-ACETAMINOPHEN 5-325 MG PO TABS: ORAL_TABLET | ORAL | Status: DC

## 2013-08-02 NOTE — Telephone Encounter (Signed)
Refilled hydrocodone #30 with no refills.  (I assume it is the hydrocodone she needs).  If so, refill printed.  If going to continue to f/u with ortho, they will need to start refilling.  Thanks.

## 2013-08-02 NOTE — Telephone Encounter (Signed)
Pt states she was referred to Dr. Clydell Hakim office.  She has been seen there but her next appointment is not until Friday and she is out of pain medication.  State she was told Dr. Nicki Reaper would need to write her a new script and she would have to pick it up at the office.  Please advise pt if pain medication can be prescribed to get her through until appt Friday.

## 2013-08-02 NOTE — Telephone Encounter (Signed)
Please advise 

## 2013-08-02 NOTE — Telephone Encounter (Signed)
Left message, notifying pt Rx ready for pickup. Rx left up front.

## 2013-08-09 ENCOUNTER — Other Ambulatory Visit: Payer: Self-pay | Admitting: Internal Medicine

## 2013-08-10 ENCOUNTER — Encounter: Payer: Self-pay | Admitting: Internal Medicine

## 2013-08-10 LAB — HM MAMMOGRAPHY

## 2013-08-12 ENCOUNTER — Telehealth: Payer: Self-pay | Admitting: Internal Medicine

## 2013-08-12 NOTE — Telephone Encounter (Signed)
Pt states she is scheduled for back injection 5/12.  States she was referred to Rachelle Hora, who prescribed gabapentin for pain.  Pt states it is not helping at all.  States Dr. Nicki Reaper has prescribed hydrocodone/acetaminphen in the past and she is asking if she could get a refill of it.  States she has #2 pills of the hydrocodone left.  States it helped much more than the gabapentin is.

## 2013-08-13 ENCOUNTER — Other Ambulatory Visit: Payer: Self-pay | Admitting: *Deleted

## 2013-08-13 ENCOUNTER — Encounter: Payer: Self-pay | Admitting: *Deleted

## 2013-08-13 MED ORDER — HYDROCODONE-ACETAMINOPHEN 5-325 MG PO TABS: ORAL_TABLET | ORAL | Status: DC

## 2013-08-13 NOTE — Telephone Encounter (Signed)
Pt notified that a Rx was placed up front & I also apologized for not receiving this message until today-however, it wasn't routed to me until today. Pt was also advised prior to speaking with Dr. Nicki Reaper to contact Dr. Arvella Nigh office & notify him that the Gabapentin was not helping & see if he can prescribe something else instead. Pt states that she has sent him a message after our first phone conversation today.

## 2013-08-13 NOTE — Telephone Encounter (Signed)
Pt states she took her last two pain medication pills this morning.  Is concerned that she will go through the weekend with no medication.  Pt asking for a call and prescription asap.

## 2013-08-13 NOTE — Telephone Encounter (Signed)
Spoke with Dr. Nicki Reaper & she states that she would be okay prescribing #10 until pt hears back from [facility] office. After this, she will prefer that all Rx's for this particular problem be filled by their office so that only one physician is filling it.

## 2013-08-17 DIAGNOSIS — M461 Sacroiliitis, not elsewhere classified: Secondary | ICD-10-CM | POA: Insufficient documentation

## 2013-09-15 DIAGNOSIS — M1710 Unilateral primary osteoarthritis, unspecified knee: Secondary | ICD-10-CM | POA: Insufficient documentation

## 2013-09-15 DIAGNOSIS — M179 Osteoarthritis of knee, unspecified: Secondary | ICD-10-CM | POA: Insufficient documentation

## 2013-10-13 ENCOUNTER — Encounter: Payer: Self-pay | Admitting: Internal Medicine

## 2013-10-13 ENCOUNTER — Ambulatory Visit (INDEPENDENT_AMBULATORY_CARE_PROVIDER_SITE_OTHER): Payer: BC Managed Care – PPO | Admitting: Internal Medicine

## 2013-10-13 VITALS — BP 110/60 | HR 64 | Temp 98.3°F | Ht 62.0 in | Wt 133.5 lb

## 2013-10-13 DIAGNOSIS — M8580 Other specified disorders of bone density and structure, unspecified site: Secondary | ICD-10-CM

## 2013-10-13 DIAGNOSIS — F439 Reaction to severe stress, unspecified: Secondary | ICD-10-CM

## 2013-10-13 DIAGNOSIS — M949 Disorder of cartilage, unspecified: Secondary | ICD-10-CM

## 2013-10-13 DIAGNOSIS — M899 Disorder of bone, unspecified: Secondary | ICD-10-CM

## 2013-10-13 DIAGNOSIS — Z9109 Other allergy status, other than to drugs and biological substances: Secondary | ICD-10-CM

## 2013-10-13 DIAGNOSIS — M25569 Pain in unspecified knee: Secondary | ICD-10-CM

## 2013-10-13 DIAGNOSIS — Z733 Stress, not elsewhere classified: Secondary | ICD-10-CM

## 2013-10-13 DIAGNOSIS — R928 Other abnormal and inconclusive findings on diagnostic imaging of breast: Secondary | ICD-10-CM

## 2013-10-13 DIAGNOSIS — K219 Gastro-esophageal reflux disease without esophagitis: Secondary | ICD-10-CM

## 2013-10-13 DIAGNOSIS — Z8619 Personal history of other infectious and parasitic diseases: Secondary | ICD-10-CM

## 2013-10-13 DIAGNOSIS — M25561 Pain in right knee: Secondary | ICD-10-CM

## 2013-10-13 NOTE — Progress Notes (Signed)
Pre visit review using our clinic review tool, if applicable. No additional management support is needed unless otherwise documented below in the visit note. 

## 2013-10-14 ENCOUNTER — Other Ambulatory Visit: Payer: Self-pay | Admitting: Internal Medicine

## 2013-10-14 NOTE — Telephone Encounter (Signed)
Refilled protonix #30 with 2 refills and flexeril #30 with no refills.

## 2013-10-14 NOTE — Telephone Encounter (Signed)
Ok to refill flexeril

## 2013-10-17 ENCOUNTER — Encounter: Payer: Self-pay | Admitting: Internal Medicine

## 2013-10-17 DIAGNOSIS — F439 Reaction to severe stress, unspecified: Secondary | ICD-10-CM | POA: Insufficient documentation

## 2013-10-17 NOTE — Assessment & Plan Note (Signed)
Sees Dr Donneta Romberg.  Stable.

## 2013-10-17 NOTE — Progress Notes (Signed)
Subjective:    Patient ID: April Santos, female    DOB: 01-23-1956, 58 y.o.   MRN: [id number]  HPI 58 year old female with past history of fibroid tumors s/p hysterectomy followed by Dr Georga Bora. She also has a history of reoccurring allergy and sinus issues.  Seeing Dr Donneta Romberg.  She comes in today for a scheduled follow up.  she has had trouble with her knee and back.  Seeing ortho Rachelle Hora).  Is s/p injection.  Doing better.  On Etodolac and Flexeril.  Only takes the etodolac once in the am.  She is starting back riding her bike.  Breathing stable.  No chest pain or tightness with increased activity or exertion.  She recently had an abnormal mammogram.  After discussion, the radiologist recommended a 6 month f/u mammogram.  Had mammogram 08/10/13 - Birads III.  Recommend f/u bilateral mammogram in 6 months.  States already scheduled.     Past Medical History  Diagnosis Date  . GERD (gastroesophageal reflux disease)   . Colon polyps   . History of uterine fibroid     Current Outpatient Prescriptions on File Prior to Visit  Medication Sig Dispense Refill  . ALPRAZolam (XANAX) 0.25 MG tablet Take 1 tablet (0.25 mg total) by mouth at bedtime as needed.  30 tablet  0  . Bepotastine Besilate (BEPREVE) 1.5 % SOLN as needed.      . Calcium Citrate (CITRACAL PO) Take by mouth 4 (four) times daily.      Marland Kitchen estradiol (ESTRACE) 0.1 MG/GM vaginal cream Place 2 g vaginally 2 (two) times a week.      . estradiol (ESTRACE) 1 MG tablet Take 1 mg by mouth daily.      Marland Kitchen etodolac (LODINE) 500 MG tablet Take 1 tablet (500 mg total) by mouth 2 (two) times daily as needed.  40 tablet  0  . fluticasone (CUTIVATE) 0.05 % cream Apply 1 application topically as needed.      Marland Kitchen HYDROcodone-acetaminophen (NORCO) 5-325 MG per tablet 1/2 - 1 tablet q 8 hours prn  10 tablet  0  . hydrocortisone 2.5 % cream Apply topically as needed.      . Hydrocortisone Ace-Pramoxine 2.35-1 % KIT Place rectally.      .  hydrOXYzine (ATARAX/VISTARIL) 25 MG tablet Take 25 mg by mouth 3 (three) times daily.      Marland Kitchen levocetirizine (XYZAL) 5 MG tablet Take 5 mg by mouth every evening.      . montelukast (SINGULAIR) 10 MG tablet Take 10 mg by mouth at bedtime.       . Multiple Vitamins-Minerals (MULTIVITAL PO) Take by mouth daily.      . Olopatadine HCl (PATANASE) 0.6 % SOLN Place into the nose as needed.       No current facility-administered medications on file prior to visit.    Review of Systems Patient denies any headache, lightheadedness or dizziness.  No significant sinus symptoms currently.  No chest pain, tightness or palpitations.  No increased shortness of breath, cough or congestion.  No nausea or vomiting.  On protonix.  Overall acid reflux controlled if watches her diet.  No abdominal pain or cramping.  No bowel change, such as diarrhea, constipation, BRBPR or melana.  No urine change.  Knee and back better.  S/p injection.  Continue to follow up with ortho.      Objective:   Physical Exam  Filed Vitals:   10/13/13 1136  BP: 110/60  Pulse: 64  Temp: 98.3 F (35.11 C)   58 year old female in no acute distress.   HEENT:  Nares- clear.  Oropharynx - without lesions. NECK:  Supple.  Nontender.  No audible bruit.  HEART:  Appears to be regular. LUNGS:  No crackles or wheezing audible.  Respirations even and unlabored.  RADIAL PULSE:  Equal bilaterally.  ABDOMEN:  Soft, nontender.  Bowel sounds present and normal.  No audible abdominal bruit.  EXTREMITIES:  No increased edema present.  DP pulses palpable and equal bilaterally.          Assessment & Plan:  GYN.  Sees Dr Georga Bora.  Up to date.    INCREASED PSYCHOSOCIAL STRESSORS. Doing well.  Follow.  Takes an occasional xanax.   GI.  Last colonoscopy 07/11/05.  Previous bowel issues.  See previous [facility] notes for details.  See above.  Due follow up colonoscopy.  Needs to schedule.  Will let me know when agreeable.    MSK.  Knee better.  Back  better.  See above.     HEALTH MAINTENANCE.  Breast, pelvic and pap smears through Dr Georga Bora office. Colonoscopy 07/11/05 - with diverticulosis.  Needs colonoscopy as outlined.  Recent mammogram recommended f/u views.  F/u mammogram 08/10/13 - Birads III.  Recommended f/u bilateral mammogram in 6 months.    I spent 25 minutes with the patient and more then 50% of the time was spent in consultation regarding the above.

## 2013-10-17 NOTE — Assessment & Plan Note (Signed)
Following with Dr Bayard Males[place] [place].  Up to date.

## 2013-10-17 NOTE — Assessment & Plan Note (Signed)
Increased stress with her mother's health issues.  Overall she feels she is handling things relatively well.  Follow.

## 2013-10-17 NOTE — Assessment & Plan Note (Signed)
On protonix.  Avoid foods that aggravate.

## 2013-10-17 NOTE — Assessment & Plan Note (Signed)
Weight bearing exercise.  Vitamin D supplements.  Follow.

## 2013-10-17 NOTE — Assessment & Plan Note (Signed)
Seeing April Santos.  Knee and back doing better.  S/p injection.  On etodolac in the am and flexeril at night.  We discussed risk and possible side effects of etodolac therapy.  She is trying to cut back.  Follow.

## 2013-10-17 NOTE — Assessment & Plan Note (Signed)
F/u mammogram 08/10/13 - Birads III.  Recommended bilateral mammogram in 6 months.  States already scheduled.

## 2014-01-12 ENCOUNTER — Ambulatory Visit (INDEPENDENT_AMBULATORY_CARE_PROVIDER_SITE_OTHER): Payer: BC Managed Care – PPO

## 2014-01-12 DIAGNOSIS — Z23 Encounter for immunization: Secondary | ICD-10-CM

## 2014-02-14 ENCOUNTER — Ambulatory Visit: Payer: BC Managed Care – PPO | Admitting: Internal Medicine

## 2014-03-10 ENCOUNTER — Ambulatory Visit: Payer: BC Managed Care – PPO | Admitting: Internal Medicine

## 2014-04-11 ENCOUNTER — Other Ambulatory Visit: Payer: Self-pay | Admitting: Internal Medicine

## 2014-04-11 NOTE — Telephone Encounter (Signed)
Last Ov 7.8.15, last refill 7.9.15.  Please advise refill

## 2014-04-11 NOTE — Telephone Encounter (Signed)
Refilled flexeril #30 with no refills.

## 2014-05-13 ENCOUNTER — Encounter: Payer: Self-pay | Admitting: Internal Medicine

## 2014-05-13 ENCOUNTER — Ambulatory Visit (INDEPENDENT_AMBULATORY_CARE_PROVIDER_SITE_OTHER): Payer: BC Managed Care – PPO | Admitting: Internal Medicine

## 2014-05-13 VITALS — BP 123/77 | HR 69 | Temp 97.5°F | Ht 62.0 in | Wt 138.2 lb

## 2014-05-13 DIAGNOSIS — Z0000 Encounter for general adult medical examination without abnormal findings: Secondary | ICD-10-CM

## 2014-05-13 DIAGNOSIS — R928 Other abnormal and inconclusive findings on diagnostic imaging of breast: Secondary | ICD-10-CM

## 2014-05-13 DIAGNOSIS — M79605 Pain in left leg: Secondary | ICD-10-CM

## 2014-05-13 DIAGNOSIS — Z658 Other specified problems related to psychosocial circumstances: Secondary | ICD-10-CM

## 2014-05-13 DIAGNOSIS — Z13220 Encounter for screening for lipoid disorders: Secondary | ICD-10-CM

## 2014-05-13 DIAGNOSIS — F439 Reaction to severe stress, unspecified: Secondary | ICD-10-CM

## 2014-05-13 DIAGNOSIS — K219 Gastro-esophageal reflux disease without esophagitis: Secondary | ICD-10-CM

## 2014-05-13 DIAGNOSIS — Z8619 Personal history of other infectious and parasitic diseases: Secondary | ICD-10-CM

## 2014-05-13 LAB — HM MAMMOGRAPHY

## 2014-05-13 MED ORDER — MELOXICAM 15 MG PO TABS: 15.0000 mg | ORAL_TABLET | Freq: Every day | ORAL | Status: DC

## 2014-05-13 NOTE — Progress Notes (Signed)
Pre visit review using our clinic review tool, if applicable. No additional management support is needed unless otherwise documented below in the visit note. 

## 2014-05-16 ENCOUNTER — Other Ambulatory Visit: Payer: Self-pay | Admitting: Internal Medicine

## 2014-05-16 ENCOUNTER — Other Ambulatory Visit (INDEPENDENT_AMBULATORY_CARE_PROVIDER_SITE_OTHER): Payer: BC Managed Care – PPO

## 2014-05-16 ENCOUNTER — Encounter: Payer: Self-pay | Admitting: Internal Medicine

## 2014-05-16 DIAGNOSIS — M79605 Pain in left leg: Secondary | ICD-10-CM | POA: Insufficient documentation

## 2014-05-16 DIAGNOSIS — Z13220 Encounter for screening for lipoid disorders: Secondary | ICD-10-CM

## 2014-05-16 DIAGNOSIS — F439 Reaction to severe stress, unspecified: Secondary | ICD-10-CM

## 2014-05-16 DIAGNOSIS — Z658 Other specified problems related to psychosocial circumstances: Secondary | ICD-10-CM

## 2014-05-16 DIAGNOSIS — D649 Anemia, unspecified: Secondary | ICD-10-CM

## 2014-05-16 DIAGNOSIS — Z0000 Encounter for general adult medical examination without abnormal findings: Secondary | ICD-10-CM | POA: Insufficient documentation

## 2014-05-16 DIAGNOSIS — K219 Gastro-esophageal reflux disease without esophagitis: Secondary | ICD-10-CM

## 2014-05-16 LAB — LIPID PANEL
CHOL/HDL RATIO: 3
CHOLESTEROL: 198 mg/dL (ref 0–200)
HDL: 73.1 mg/dL (ref >39.00)
LDL CALC: 114 mg/dL — AB (ref 0–99)
NonHDL: 124.9
Triglycerides: 53 mg/dL (ref 0.0–149.0)
VLDL: 10.6 mg/dL (ref 0.0–40.0)

## 2014-05-16 LAB — COMPREHENSIVE METABOLIC PANEL
ALK PHOS: 77 U/L (ref 39–117)
ALT: 13 U/L (ref 0–35)
AST: 20 U/L (ref 0–37)
Albumin: 3.9 g/dL (ref 3.5–5.2)
BUN: 17 mg/dL (ref 6–23)
CO2: 27 meq/L (ref 19–32)
Calcium: 9.5 mg/dL (ref 8.4–10.5)
Chloride: 105 mEq/L (ref 96–112)
Creatinine, Ser: 0.79 mg/dL (ref 0.40–1.20)
GFR: 78.92 mL/min (ref >60.00)
Glucose, Bld: 90 mg/dL (ref 70–99)
Potassium: 4.9 mEq/L (ref 3.5–5.1)
SODIUM: 138 meq/L (ref 135–145)
Total Bilirubin: 0.4 mg/dL (ref 0.2–1.2)
Total Protein: 6.8 g/dL (ref 6.0–8.3)

## 2014-05-16 LAB — TSH: TSH: 2.27 u[IU]/mL (ref 0.35–4.50)

## 2014-05-16 LAB — CBC WITH DIFFERENTIAL/PLATELET
Basophils Absolute: 0 10*3/uL (ref 0.0–0.1)
Basophils Relative: 0.4 % (ref 0.0–3.0)
EOS PCT: 0.6 % (ref 0.0–5.0)
Eosinophils Absolute: 0 10*3/uL (ref 0.0–0.7)
HEMATOCRIT: 36.1 % (ref 36.0–46.0)
Hemoglobin: 11.9 g/dL — ABNORMAL LOW (ref 12.0–15.0)
LYMPHS ABS: 1.6 10*3/uL (ref 0.7–4.0)
Lymphocytes Relative: 36.7 % (ref 12.0–46.0)
MCHC: 33.1 g/dL (ref 30.0–36.0)
MCV: 77 fl — AB (ref 78.0–100.0)
MONO ABS: 0.5 10*3/uL (ref 0.1–1.0)
Monocytes Relative: 11.1 % (ref 3.0–12.0)
Neutro Abs: 2.2 10*3/uL (ref 1.4–7.7)
Neutrophils Relative %: 51.2 % (ref 43.0–77.0)
PLATELETS: 332 10*3/uL (ref 150.0–400.0)
RBC: 4.68 Mil/uL (ref 3.87–5.11)
RDW: 18 % — ABNORMAL HIGH (ref 11.5–15.5)
WBC: 4.4 10*3/uL (ref 4.0–10.5)

## 2014-05-16 MED ORDER — CYCLOBENZAPRINE HCL 5 MG PO TABS: 5.0000 mg | ORAL_TABLET | Freq: Every evening | ORAL | Status: DC | PRN

## 2014-05-16 NOTE — Telephone Encounter (Signed)
Refilled flexeril #30 with one refill.

## 2014-05-16 NOTE — Assessment & Plan Note (Signed)
Feels she is handling stress relatively well.  Follow.

## 2014-05-16 NOTE — Progress Notes (Signed)
Patient ID: April Santos, female   DOB: Sep 24, 1955, 59 y.o.   MRN: [id number]   Subjective:    Patient ID: April Santos, female    DOB: March 01, 1956, 59 y.o.   MRN: [id number]  HPI  Patient here for a scheduled follow up.  Has been having knee pain previously.  Now having low back pain and leg pain.  Seeing Dr Sharlet Salina.  Received injection.  Did not help as much.  Taking lodine twice a day.  On protonix.  Stomach doing ok.  Discussed treatment options for her back and leg pain.   Pain bothers her more when she is at work.  Sitting worse.  Pain extends down to the top of her foot.  If she pulls her leg up, takes pressure off.  No numbness or tingling.    Past Medical History  Diagnosis Date  . GERD (gastroesophageal reflux disease)   . Colon polyps   . History of uterine fibroid     Outpatient Encounter Prescriptions as of 05/13/2014  Medication Sig  . ALPRAZolam (XANAX) 0.25 MG tablet Take 1 tablet (0.25 mg total) by mouth at bedtime as needed.  . Bepotastine Besilate (BEPREVE) 1.5 % SOLN as needed.  . Calcium Citrate (CITRACAL PO) Take by mouth 4 (four) times daily.  . cyclobenzaprine (FLEXERIL) 5 MG tablet TAKE ONE TABLET BY MOUTH AT BEDTIME AS NEEDED  . estradiol (ESTRACE) 0.1 MG/GM vaginal cream Place 2 g vaginally 2 (two) times a week.  . estradiol (ESTRACE) 1 MG tablet Take 1 mg by mouth daily.  . fluticasone (CUTIVATE) 0.05 % cream Apply 1 application topically as needed.  Marland Kitchen HYDROcodone-acetaminophen (NORCO) 5-325 MG per tablet 1/2 - 1 tablet q 8 hours prn  . hydrocortisone 2.5 % cream Apply topically as needed.  . Hydrocortisone Ace-Pramoxine 2.35-1 % KIT Place rectally.  . hydrOXYzine (ATARAX/VISTARIL) 25 MG tablet Take 25 mg by mouth 3 (three) times daily.  Marland Kitchen levocetirizine (XYZAL) 5 MG tablet Take 5 mg by mouth every evening.  . montelukast (SINGULAIR) 10 MG tablet Take 10 mg by mouth at bedtime.   . Multiple Vitamins-Minerals (MULTIVITAL PO) Take by mouth daily.  .  Olopatadine HCl (PATANASE) 0.6 % SOLN Place into the nose as needed.  . pantoprazole (PROTONIX) 40 MG tablet TAKE ONE TABLET EVERY DAY  . [DISCONTINUED] etodolac (LODINE) 500 MG tablet Take 1 tablet (500 mg total) by mouth 2 (two) times daily as needed.  . meloxicam (MOBIC) 15 MG tablet Take 1 tablet (15 mg total) by mouth daily.    Review of Systems  Constitutional: Negative for fatigue and unexpected weight change.  HENT: Negative for congestion and sinus pressure.   Respiratory: Negative for cough, chest tightness and shortness of breath.   Cardiovascular: Negative for chest pain, palpitations and leg swelling.  Gastrointestinal: Negative for nausea, vomiting, abdominal pain, diarrhea and constipation.  Musculoskeletal: Positive for back pain (left lower back pain and left leg pain as outlined.  ).  Skin: Negative for rash.  Neurological: Negative for dizziness, light-headedness and headaches.       Objective:    Physical Exam  HENT:  Nose: Nose normal.  Mouth/Throat: Oropharynx is clear and moist.  Neck: Neck supple. No thyromegaly present.  Cardiovascular: Normal rate and regular rhythm.   Pulmonary/Chest: Breath sounds normal. No respiratory distress. She has no wheezes.  Abdominal: Soft. Bowel sounds are normal. There is no tenderness.  Musculoskeletal: She exhibits no edema or tenderness.  Negative straight leg raise.  Motor strength appears to be equal bilateral lower extremities.    Lymphadenopathy:    She has no cervical adenopathy.    BP 123/77 mmHg  Pulse 69  Temp(Src) 97.5 F (36.4 C) (Oral)  Ht $R'5\' 2"'xw$  (1.575 m)  Wt 138 lb 4 oz (62.71 kg)  BMI 25.28 kg/m2  SpO2 99%  LMP 02/26/2002 Wt Readings from Last 3 Encounters:  05/13/14 138 lb 4 oz (62.71 kg)  10/13/13 133 lb 8 oz (60.555 kg)  07/16/13 136 lb 12 oz (62.029 kg)     Lab Results  Component Value Date   WBC 4.1* 01/06/2013   HGB 12.6 01/06/2013   HCT 37.4 01/06/2013   PLT 259.0 01/06/2013    GLUCOSE 92 11/24/2012   CHOL 154 11/24/2012   TRIG 55.0 11/24/2012   HDL 60.10 11/24/2012   LDLCALC 83 11/24/2012   ALT 13 11/24/2012   AST 21 11/24/2012   NA 140 11/24/2012   K 4.1 11/24/2012   CL 109 11/24/2012   CREATININE 0.8 11/24/2012   BUN 13 11/24/2012   CO2 26 11/24/2012   TSH 2.65 11/24/2012    Mm Digital Screening Bilateral  11/06/2003   FINDINGS:  Comparison is made to previous study dated 09/09/01.  The breast tissue is heterogeneously dense.  There is no dominant mass, architectural distortion, or calcification to suggest malignancy.  IMPRESSION: No mammographic evidence of malignancy.  Suggest screening mammography in one year.  ASSESSMENT: Negative - BI-RADS 1  Screening mammogram in 1 year.  Provider: Geanie Logan      Assessment & Plan:   Problem List Items Addressed This Visit    Abnormal mammogram    05/13/14 mammogram reported as ok.  Follow.        GERD (gastroesophageal reflux disease)    Symptoms controlled.  On protonix.        Relevant Orders   CBC with Differential/Platelet   Comprehensive metabolic panel   Health care maintenance    Sees gyn for her breast and pelvic exams.  Just had mammogram 05/13/14.  Discussed the need for colonoscopy.  She declines to be scheduled at this time.        History of condyloma acuminatum    Follows with gyn.  States up to date.        Leg pain, left - Primary    Back and left leg pain as outlined.  Seeing Dr Sharlet Salina.  Will stop lodine.  Mobic as directed.   Discussed the possible GI side effects.  Is s/p injection.  Did not help.  Plans to f/u with Dr Sharlet Salina.  May need MRI and f/u with ortho.        Stress    Feels she is handling stress relatively well.  Follow.        Relevant Orders   TSH    Other Visit Diagnoses    Screening cholesterol level        Relevant Orders    Lipid panel      I spent 25 minutes with the patient and more than 50% of the time was spent in consultation regarding the above.       Einar Pheasant, MD

## 2014-05-16 NOTE — Assessment & Plan Note (Signed)
Back and left leg pain as outlined.  Seeing Dr Sharlet Salina.  Will stop lodine.  Mobic as directed.   Discussed the possible GI side effects.  Is s/p injection.  Did not help.  Plans to f/u with Dr Sharlet Salina.  May need MRI and f/u with ortho.

## 2014-05-16 NOTE — Assessment & Plan Note (Signed)
Sees gyn for her breast and pelvic exams.  Just had mammogram 05/13/14.  Discussed the need for colonoscopy.  She declines to be scheduled at this time.

## 2014-05-16 NOTE — Assessment & Plan Note (Signed)
05/13/14 mammogram reported as ok.  Follow.

## 2014-05-16 NOTE — Telephone Encounter (Signed)
Pt needs refill on Flexeril. Please advise pt/msn

## 2014-05-16 NOTE — Telephone Encounter (Signed)
Last visit 05/13/14, ok refill?

## 2014-05-16 NOTE — Assessment & Plan Note (Signed)
Symptoms controlled.  On protonix.

## 2014-05-16 NOTE — Progress Notes (Signed)
Orders placed for f/u labs.  

## 2014-05-16 NOTE — Assessment & Plan Note (Signed)
Follows with gyn.  States up to date.

## 2014-05-17 ENCOUNTER — Telehealth: Payer: Self-pay

## 2014-05-17 NOTE — Telephone Encounter (Signed)
The patient called hoping to get information regarding her lab work.

## 2014-05-17 NOTE — Telephone Encounter (Signed)
Pt was called back, see lab results. Still no answer, left voicemail again.

## 2014-05-26 ENCOUNTER — Other Ambulatory Visit (INDEPENDENT_AMBULATORY_CARE_PROVIDER_SITE_OTHER): Payer: BC Managed Care – PPO

## 2014-05-26 ENCOUNTER — Other Ambulatory Visit: Payer: BC Managed Care – PPO

## 2014-05-26 DIAGNOSIS — D649 Anemia, unspecified: Secondary | ICD-10-CM | POA: Diagnosis not present

## 2014-05-27 LAB — IBC PANEL
IRON: 28 ug/dL — AB (ref 42–145)
SATURATION RATIOS: 5.9 % — AB (ref 20.0–50.0)
TRANSFERRIN: 341 mg/dL (ref 212.0–360.0)

## 2014-05-27 LAB — CBC WITH DIFFERENTIAL/PLATELET
Basophils Absolute: 0 10*3/uL (ref 0.0–0.1)
Basophils Relative: 0.6 % (ref 0.0–3.0)
EOS PCT: 1 % (ref 0.0–5.0)
Eosinophils Absolute: 0.1 10*3/uL (ref 0.0–0.7)
HEMATOCRIT: 36.4 % (ref 36.0–46.0)
Hemoglobin: 12 g/dL (ref 12.0–15.0)
Lymphocytes Relative: 28.4 % (ref 12.0–46.0)
Lymphs Abs: 1.7 10*3/uL (ref 0.7–4.0)
MCHC: 33.1 g/dL (ref 30.0–36.0)
MCV: 78.4 fl (ref 78.0–100.0)
MONOS PCT: 10 % (ref 3.0–12.0)
Monocytes Absolute: 0.6 10*3/uL (ref 0.1–1.0)
NEUTROS PCT: 60 % (ref 43.0–77.0)
Neutro Abs: 3.7 10*3/uL (ref 1.4–7.7)
PLATELETS: 296 10*3/uL (ref 150.0–400.0)
RBC: 4.64 Mil/uL (ref 3.87–5.11)
RDW: 18.2 % — ABNORMAL HIGH (ref 11.5–15.5)
WBC: 6.1 10*3/uL (ref 4.0–10.5)

## 2014-05-27 LAB — VITAMIN B12: Vitamin B-12: 277 pg/mL (ref 211–911)

## 2014-05-27 LAB — FERRITIN: FERRITIN: 6.7 ng/mL — AB (ref 10.0–291.0)

## 2014-05-29 ENCOUNTER — Other Ambulatory Visit: Payer: Self-pay | Admitting: Internal Medicine

## 2014-05-29 DIAGNOSIS — D509 Iron deficiency anemia, unspecified: Secondary | ICD-10-CM

## 2014-05-29 NOTE — Progress Notes (Signed)
Order placed for f/u lab.   

## 2014-05-30 ENCOUNTER — Encounter: Payer: Self-pay | Admitting: *Deleted

## 2014-06-11 ENCOUNTER — Other Ambulatory Visit: Payer: Self-pay | Admitting: Internal Medicine

## 2014-08-01 ENCOUNTER — Other Ambulatory Visit: Payer: BC Managed Care – PPO

## 2014-08-15 ENCOUNTER — Other Ambulatory Visit: Payer: Self-pay | Admitting: Internal Medicine

## 2014-08-15 NOTE — Telephone Encounter (Signed)
Last OV and refill 2.5.16.  Please advise refill

## 2014-08-16 NOTE — Telephone Encounter (Signed)
Refilled meloxicam #30 with no refills.

## 2014-09-08 ENCOUNTER — Other Ambulatory Visit: Payer: Self-pay | Admitting: Orthopedic Surgery

## 2014-09-08 DIAGNOSIS — M5416 Radiculopathy, lumbar region: Secondary | ICD-10-CM

## 2014-09-14 ENCOUNTER — Ambulatory Visit
Admission: RE | Admit: 2014-09-14 | Discharge: 2014-09-14 | Disposition: A | Discharge status: Home / Self Care | Payer: BC Managed Care – PPO | Source: Ambulatory Visit | Attending: Orthopedic Surgery | Admitting: Orthopedic Surgery

## 2014-09-14 DIAGNOSIS — M5126 Other intervertebral disc displacement, lumbar region: Secondary | ICD-10-CM | POA: Insufficient documentation

## 2014-09-14 DIAGNOSIS — M5136 Other intervertebral disc degeneration, lumbar region: Secondary | ICD-10-CM | POA: Insufficient documentation

## 2014-09-14 DIAGNOSIS — M5416 Radiculopathy, lumbar region: Secondary | ICD-10-CM | POA: Diagnosis present

## 2014-09-15 ENCOUNTER — Ambulatory Visit: Payer: BC Managed Care – PPO | Admitting: Internal Medicine

## 2014-09-20 ENCOUNTER — Other Ambulatory Visit: Payer: Self-pay | Admitting: Internal Medicine

## 2014-09-20 NOTE — Telephone Encounter (Signed)
Refilled flexeril #30 with no refills.

## 2014-09-20 NOTE — Telephone Encounter (Signed)
Last OV 2.5.16.  Please advise refill

## 2014-10-20 DIAGNOSIS — G8929 Other chronic pain: Secondary | ICD-10-CM | POA: Insufficient documentation

## 2014-10-20 DIAGNOSIS — M5416 Radiculopathy, lumbar region: Secondary | ICD-10-CM | POA: Insufficient documentation

## 2014-10-20 DIAGNOSIS — M5126 Other intervertebral disc displacement, lumbar region: Secondary | ICD-10-CM | POA: Insufficient documentation

## 2014-11-24 ENCOUNTER — Encounter: Payer: Self-pay | Admitting: Internal Medicine

## 2014-11-24 ENCOUNTER — Ambulatory Visit (INDEPENDENT_AMBULATORY_CARE_PROVIDER_SITE_OTHER): Payer: BC Managed Care – PPO | Admitting: Internal Medicine

## 2014-11-24 VITALS — BP 120/80 | HR 55 | Temp 98.0°F | Ht 62.0 in | Wt 137.0 lb

## 2014-11-24 DIAGNOSIS — G5600 Carpal tunnel syndrome, unspecified upper limb: Secondary | ICD-10-CM | POA: Diagnosis not present

## 2014-11-24 DIAGNOSIS — Z79899 Other long term (current) drug therapy: Secondary | ICD-10-CM

## 2014-11-24 DIAGNOSIS — K219 Gastro-esophageal reflux disease without esophagitis: Secondary | ICD-10-CM | POA: Diagnosis not present

## 2014-11-24 DIAGNOSIS — M8580 Other specified disorders of bone density and structure, unspecified site: Secondary | ICD-10-CM | POA: Diagnosis not present

## 2014-11-24 DIAGNOSIS — Z658 Other specified problems related to psychosocial circumstances: Secondary | ICD-10-CM

## 2014-11-24 DIAGNOSIS — Z91048 Other nonmedicinal substance allergy status: Secondary | ICD-10-CM

## 2014-11-24 DIAGNOSIS — Z8619 Personal history of other infectious and parasitic diseases: Secondary | ICD-10-CM

## 2014-11-24 DIAGNOSIS — R928 Other abnormal and inconclusive findings on diagnostic imaging of breast: Secondary | ICD-10-CM | POA: Diagnosis not present

## 2014-11-24 DIAGNOSIS — F439 Reaction to severe stress, unspecified: Secondary | ICD-10-CM

## 2014-11-24 DIAGNOSIS — M79605 Pain in left leg: Secondary | ICD-10-CM

## 2014-11-24 DIAGNOSIS — Z9109 Other allergy status, other than to drugs and biological substances: Secondary | ICD-10-CM

## 2014-11-24 MED ORDER — PANTOPRAZOLE SODIUM 40 MG PO TBEC: 40.0000 mg | DELAYED_RELEASE_TABLET | Freq: Every day | ORAL | Status: DC

## 2014-11-24 MED ORDER — ESTRADIOL 1 MG PO TABS: 1.0000 mg | ORAL_TABLET | Freq: Every day | ORAL | Status: DC

## 2014-11-24 NOTE — Progress Notes (Signed)
Pre-visit discussion using our clinic review tool. No additional management support is needed unless otherwise documented below in the visit note.  

## 2014-11-24 NOTE — Progress Notes (Signed)
Patient ID: April Santos, female   DOB: 03-29-1956, 59 y.o.   MRN: [id number]   Subjective:    Patient ID: April Santos, female    DOB: [birth year], 59 y.o.   MRN: [id number]  HPI  Patient here for a scheduled follow up.   Is being followed by Dr Sharlet Salina and orthopedics for back and leg pain.  S/p injection 10/2014.  Planning to f/u soon.  On gabapentin and vicodin.  She is riding her bike for exercise.  This helps.  Has f/u with ortho 12/30/14.  She also reports some left upper extremity problems.  Feels related to gripping her bike.  On protonix for acid reflux.  Trying to avoid increased antiinflammatories.  Taking estrogen.  Unable to get rx from gyn at this time.  Is up to date on her breast, pelvic and pap smears.  Wants to remain on estrogen.  Discussed possible side effects and risk of medication.     Past Medical History  Diagnosis Date  . GERD (gastroesophageal reflux disease)   . Colon polyps   . History of uterine fibroid    Past Surgical History  Procedure Laterality Date  . Uterine fibroid surgery  1997  . Breast surgery  1999    biopsy  . Foot surgery  02/07/1003    right foot benign tumor  . Carpal  2005    Tunnel right hand  . Sacroiliac joint injection  08/17/13 & 04/13/14    Dr. Sharlet Salina   Family History  Problem Relation Age of Onset  . Breast cancer Mother     double masectomy  . Arthritis Father   . GER disease Father   . Heart disease      paternal uncles  . Colon cancer Neg Hx    Social History   Social History  . Marital Status: Divorced    Spouse Name: N/A  . Number of Children: 0  . Years of Education: N/A   Social History Main Topics  . Smoking status: Never Smoker   . Smokeless tobacco: Never Used  . Alcohol Use: 0.0 oz/week    0 Standard drinks or equivalent per week  . Drug Use: No  . Sexual Activity: Not Asked   Other Topics Concern  . None   Social History Narrative    Outpatient Encounter Prescriptions as of 11/24/2014    Medication Sig  . ALPRAZolam (XANAX) 0.25 MG tablet Take 1 tablet (0.25 mg total) by mouth at bedtime as needed.  . Bepotastine Besilate (BEPREVE) 1.5 % SOLN as needed.  . Calcium Citrate (CITRACAL PO) Take by mouth 4 (four) times daily.  . cyclobenzaprine (FLEXERIL) 5 MG tablet TAKE ONE TABLET BY MOUTH AT BEDTIME AS NEEDED  . estradiol (ESTRACE) 0.1 MG/GM vaginal cream Place 2 g vaginally 2 (two) times a week.  . estradiol (ESTRACE) 1 MG tablet Take 1 tablet (1 mg total) by mouth daily.  . fluticasone (CUTIVATE) 0.05 % cream Apply 1 application topically as needed.  . gabapentin (NEURONTIN) 300 MG capsule Take 300 mg by mouth 2 (two) times daily.  Marland Kitchen HYDROcodone-acetaminophen (NORCO) 5-325 MG per tablet 1/2 - 1 tablet q 8 hours prn  . hydrocortisone 2.5 % cream Apply topically as needed.  . Hydrocortisone Ace-Pramoxine 2.35-1 % KIT Place rectally.  . hydrOXYzine (ATARAX/VISTARIL) 25 MG tablet Take 25 mg by mouth 3 (three) times daily.  Marland Kitchen levocetirizine (XYZAL) 5 MG tablet Take 5 mg by mouth every evening.  . meloxicam (MOBIC) 15  MG tablet Take 1 tablet (15 mg total) by mouth daily as needed for pain.  . montelukast (SINGULAIR) 10 MG tablet Take 10 mg by mouth at bedtime.   . Multiple Vitamins-Minerals (MULTIVITAL PO) Take by mouth daily.  . Olopatadine HCl (PATANASE) 0.6 % SOLN Place into the nose as needed.  . pantoprazole (PROTONIX) 40 MG tablet Take 1 tablet (40 mg total) by mouth daily.  . [DISCONTINUED] estradiol (ESTRACE) 1 MG tablet Take 1 mg by mouth daily.  . [DISCONTINUED] pantoprazole (PROTONIX) 40 MG tablet TAKE ONE TABLET BY MOUTH EVERY DAY   No facility-administered encounter medications on file as of 11/24/2014.    Review of Systems  Constitutional: Negative for appetite change and unexpected weight change.  HENT: Negative for congestion and sinus pressure.   Eyes: Negative for discharge and visual disturbance.  Respiratory: Negative for cough, chest tightness and  shortness of breath.   Cardiovascular: Negative for chest pain, palpitations and leg swelling.  Gastrointestinal: Negative for nausea, vomiting, abdominal pain and diarrhea.  Genitourinary: Negative for dysuria and difficulty urinating.  Musculoskeletal: Negative for joint swelling.       Persistent leg pain (left).  Seeing Dr Sharlet Salina and ortho.    Skin: Negative for color change and rash.  Neurological: Negative for dizziness, light-headedness and headaches.  Psychiatric/Behavioral: Negative for dysphoric mood and agitation.       Objective:    Physical Exam  Constitutional: She appears well-developed and well-nourished. No distress.  HENT:  Nose: Nose normal.  Mouth/Throat: Oropharynx is clear and moist.  Eyes: Conjunctivae are normal. Right eye exhibits no discharge. Left eye exhibits no discharge.  Neck: Neck supple. No thyromegaly present.  Cardiovascular: Normal rate and regular rhythm.   Pulmonary/Chest: Breath sounds normal. No respiratory distress. She has no wheezes.  Abdominal: Soft. Bowel sounds are normal. There is no tenderness.  Musculoskeletal: She exhibits no edema or tenderness.  Lymphadenopathy:    She has no cervical adenopathy.  Skin: No rash noted. No erythema.  Psychiatric: She has a normal mood and affect. Her behavior is normal.    BP 120/80 mmHg  Pulse 55  Temp(Src) 98 F (36.7 C) (Oral)  Ht $R'5\' 2"'QB$  (1.575 m)  Wt 137 lb (62.143 kg)  BMI 25.05 kg/m2  SpO2 96%  LMP 02/26/2002 Wt Readings from Last 3 Encounters:  11/24/14 137 lb (62.143 kg)  09/14/14 138 lb (62.596 kg)  05/13/14 138 lb 4 oz (62.71 kg)     Lab Results  Component Value Date   WBC 6.1 05/26/2014   HGB 12.0 05/26/2014   HCT 36.4 05/26/2014   PLT 296.0 05/26/2014   GLUCOSE 90 05/16/2014   CHOL 198 05/16/2014   TRIG 53.0 05/16/2014   HDL 73.10 05/16/2014   LDLCALC 114* 05/16/2014   ALT 13 05/16/2014   AST 20 05/16/2014   NA 138 05/16/2014   K 4.9 05/16/2014   CL 105  05/16/2014   CREATININE 0.79 05/16/2014   BUN 17 05/16/2014   CO2 27 05/16/2014   TSH 2.27 05/16/2014    Mr Lumbar Spine Wo Contrast  09/14/2014   CLINICAL DATA:  Lumbar radiculopathy.  Severe right hip and leg pain  EXAM: MRI LUMBAR SPINE WITHOUT CONTRAST  TECHNIQUE: Multiplanar, multisequence MR imaging of the lumbar spine was performed. No intravenous contrast was administered.  COMPARISON:  None.  FINDINGS: Negative for fracture or mass lesion. Conus medullaris is normal and terminates at L1-2.  L1-2:  Negative  L2-3: Disc bulging and spondylosis.  Mild narrowing of the canal without significant spinal stenosis. Mild foraminal narrowing bilaterally  L3-4: Right-sided disc protrusion. Extruded disc fragment extending caudally behind the L3 vertebral body on the right. Disc protrusion extends into the right foramen with impingement of the right L3 nerve root in the foramen. Bilateral facet hypertrophy and mild spinal stenosis also noted. Left foramen patent.  L4-5: 5 mm anterior slip. Disc degeneration and moderate facet degeneration. Mild spinal stenosis. Right foraminal encroachment with compression of the right L4 nerve root. Left foramen patent  L5-S1: Disc degeneration left greater than right with disc bulging and spurring. Mild facet hypertrophy bilaterally. Mild left foraminal narrowing.  IMPRESSION: L3-4 right-sided disc and protrusion with extruded disc fragment extending caudally behind the L3 vertebral body on the right. Disc protrusion extends into the right foramen with impingement of the right L3 nerve root.  Grade 1 slip L4-5 . Right foraminal encroachment with compression of the right L4 nerve root.   Electronically Signed   By: Franchot Gallo M.D.   On: 09/14/2014 08:29       Assessment & Plan:   Problem List Items Addressed This Visit    Abnormal mammogram    Per report, mammogram 05/13/14 - ok.        Carpal tunnel syndrome    Is s/p surgery on right.  With some left upper  extremity issues now.  Will start wearing her splint.  Follow.  Notify me if persistent problems.        Relevant Medications   gabapentin (NEURONTIN) 300 MG capsule   Current use of estrogen therapy    Wants to continue on estrogen therapy.  Understands risks and possible side effects of medication.  Follow.        Relevant Orders   Lipid panel   Environmental allergies    Followed by Dr Donneta Romberg.       GERD (gastroesophageal reflux disease) - Primary    On protonix.  Symptoms controlled.  Trying to avoid antiinflammatories.        Relevant Medications   pantoprazole (PROTONIX) 40 MG tablet   Other Relevant Orders   Comprehensive metabolic panel   History of condyloma acuminatum    Followed by gyn.       Leg pain, left    Seeing Dr Sharlet Salina and ortho.  MRI as outlined.  S/p injection.  Keep f/u appt.  Riding her bike - helps.        Osteopenia    Bone density as outlined in overview.  Continue vitamin D.        Stress    Feels she is handling stress relatively well.  Follow.            Einar Pheasant, MD

## 2014-11-29 ENCOUNTER — Encounter: Payer: Self-pay | Admitting: Internal Medicine

## 2014-11-29 DIAGNOSIS — Z79818 Long term (current) use of other agents affecting estrogen receptors and estrogen levels: Secondary | ICD-10-CM | POA: Insufficient documentation

## 2014-11-29 DIAGNOSIS — Z79899 Other long term (current) drug therapy: Secondary | ICD-10-CM | POA: Insufficient documentation

## 2014-11-29 NOTE — Assessment & Plan Note (Signed)
Followed by gyn

## 2014-11-29 NOTE — Assessment & Plan Note (Signed)
On protonix.  Symptoms controlled.  Trying to avoid antiinflammatories.

## 2014-11-29 NOTE — Assessment & Plan Note (Signed)
Is s/p surgery on right.  With some left upper extremity issues now.  Will start wearing her splint.  Follow.  Notify me if persistent problems.

## 2014-11-29 NOTE — Assessment & Plan Note (Signed)
Per report, mammogram 05/13/14 - ok.

## 2014-11-29 NOTE — Assessment & Plan Note (Signed)
Bone density as outlined in overview.  Continue vitamin D.

## 2014-11-29 NOTE — Assessment & Plan Note (Signed)
Wants to continue on estrogen therapy.  Understands risks and possible side effects of medication.  Follow.

## 2014-11-29 NOTE — Assessment & Plan Note (Signed)
Seeing Dr Sharlet Salina and ortho.  MRI as outlined.  S/p injection.  Keep f/u appt.  Riding her bike - helps.

## 2014-11-29 NOTE — Assessment & Plan Note (Signed)
Feels she is handling stress relatively well.  Follow.

## 2014-11-29 NOTE — Assessment & Plan Note (Signed)
Followed by Dr Donneta Romberg.

## 2014-12-19 ENCOUNTER — Other Ambulatory Visit: Payer: Self-pay | Admitting: Internal Medicine

## 2014-12-20 NOTE — Telephone Encounter (Signed)
ok'd refill mobic #30 with no refills and flexeril #30 with no refills.

## 2014-12-20 NOTE — Telephone Encounter (Signed)
Last OV 8.18.16.  Please advise refill

## 2015-01-04 ENCOUNTER — Other Ambulatory Visit (INDEPENDENT_AMBULATORY_CARE_PROVIDER_SITE_OTHER): Payer: BC Managed Care – PPO

## 2015-01-04 ENCOUNTER — Encounter: Payer: Self-pay | Admitting: Internal Medicine

## 2015-01-04 DIAGNOSIS — K219 Gastro-esophageal reflux disease without esophagitis: Secondary | ICD-10-CM

## 2015-01-04 DIAGNOSIS — Z79899 Other long term (current) drug therapy: Secondary | ICD-10-CM | POA: Diagnosis not present

## 2015-01-04 DIAGNOSIS — D509 Iron deficiency anemia, unspecified: Secondary | ICD-10-CM

## 2015-01-04 DIAGNOSIS — Z23 Encounter for immunization: Secondary | ICD-10-CM

## 2015-01-04 DIAGNOSIS — Z79818 Long term (current) use of other agents affecting estrogen receptors and estrogen levels: Secondary | ICD-10-CM

## 2015-01-04 LAB — COMPREHENSIVE METABOLIC PANEL
ALT: 11 U/L (ref 0–35)
AST: 17 U/L (ref 0–37)
Albumin: 4.2 g/dL (ref 3.5–5.2)
Alkaline Phosphatase: 82 U/L (ref 39–117)
BUN: 17 mg/dL (ref 6–23)
CALCIUM: 9.7 mg/dL (ref 8.4–10.5)
CHLORIDE: 105 meq/L (ref 96–112)
CO2: 31 meq/L (ref 19–32)
Creatinine, Ser: 0.78 mg/dL (ref 0.40–1.20)
GFR: 79.91 mL/min (ref >60.00)
Glucose, Bld: 90 mg/dL (ref 70–99)
Potassium: 5.2 mEq/L — ABNORMAL HIGH (ref 3.5–5.1)
Sodium: 142 mEq/L (ref 135–145)
Total Bilirubin: 0.5 mg/dL (ref 0.2–1.2)
Total Protein: 6.7 g/dL (ref 6.0–8.3)

## 2015-01-04 LAB — CBC WITH DIFFERENTIAL/PLATELET
BASOS ABS: 0 10*3/uL (ref 0.0–0.1)
BASOS PCT: 0.3 % (ref 0.0–3.0)
Eosinophils Absolute: 0 10*3/uL (ref 0.0–0.7)
Eosinophils Relative: 0.7 % (ref 0.0–5.0)
HCT: 40.8 % (ref 36.0–46.0)
Hemoglobin: 13.6 g/dL (ref 12.0–15.0)
LYMPHS PCT: 37.6 % (ref 12.0–46.0)
Lymphs Abs: 1.6 10*3/uL (ref 0.7–4.0)
MCHC: 33.3 g/dL (ref 30.0–36.0)
MCV: 83.9 fl (ref 78.0–100.0)
Monocytes Absolute: 0.4 10*3/uL (ref 0.1–1.0)
Monocytes Relative: 10.7 % (ref 3.0–12.0)
Neutro Abs: 2.1 10*3/uL (ref 1.4–7.7)
Neutrophils Relative %: 50.7 % (ref 43.0–77.0)
Platelets: 287 10*3/uL (ref 150.0–400.0)
RBC: 4.86 Mil/uL (ref 3.87–5.11)
RDW: 15.5 % (ref 11.5–15.5)
WBC: 4.2 10*3/uL (ref 4.0–10.5)

## 2015-01-04 LAB — LIPID PANEL
Cholesterol: 181 mg/dL (ref 0–200)
HDL: 62.7 mg/dL (ref >39.00)
LDL CALC: 99 mg/dL (ref 0–99)
NonHDL: 118.27
TRIGLYCERIDES: 96 mg/dL (ref 0.0–149.0)
Total CHOL/HDL Ratio: 3
VLDL: 19.2 mg/dL (ref 0.0–40.0)

## 2015-01-04 LAB — FERRITIN: Ferritin: 12.4 ng/mL (ref 10.0–291.0)

## 2015-01-05 ENCOUNTER — Telehealth: Payer: Self-pay | Admitting: *Deleted

## 2015-01-05 DIAGNOSIS — E875 Hyperkalemia: Secondary | ICD-10-CM

## 2015-01-05 NOTE — Telephone Encounter (Signed)
Pt coming in tomorrow what labs and dx?  

## 2015-01-05 NOTE — Telephone Encounter (Signed)
Order placed for potassium.   

## 2015-01-06 ENCOUNTER — Other Ambulatory Visit: Payer: BC Managed Care – PPO

## 2015-01-09 ENCOUNTER — Other Ambulatory Visit (INDEPENDENT_AMBULATORY_CARE_PROVIDER_SITE_OTHER): Payer: BC Managed Care – PPO

## 2015-01-09 DIAGNOSIS — E875 Hyperkalemia: Secondary | ICD-10-CM

## 2015-01-09 LAB — POTASSIUM: POTASSIUM: 3.9 meq/L (ref 3.5–5.1)

## 2015-01-10 ENCOUNTER — Encounter: Payer: Self-pay | Admitting: Internal Medicine

## 2015-03-13 ENCOUNTER — Encounter: Payer: Self-pay | Admitting: Internal Medicine

## 2015-03-15 ENCOUNTER — Encounter: Payer: Self-pay | Admitting: Internal Medicine

## 2015-05-29 ENCOUNTER — Encounter: Payer: Self-pay | Admitting: Internal Medicine

## 2015-05-29 ENCOUNTER — Ambulatory Visit (INDEPENDENT_AMBULATORY_CARE_PROVIDER_SITE_OTHER): Payer: BC Managed Care – PPO | Admitting: Internal Medicine

## 2015-05-29 VITALS — BP 110/80 | HR 69 | Temp 98.3°F | Resp 18 | Ht 62.0 in | Wt 136.5 lb

## 2015-05-29 DIAGNOSIS — D509 Iron deficiency anemia, unspecified: Secondary | ICD-10-CM | POA: Diagnosis not present

## 2015-05-29 DIAGNOSIS — K219 Gastro-esophageal reflux disease without esophagitis: Secondary | ICD-10-CM

## 2015-05-29 DIAGNOSIS — Z658 Other specified problems related to psychosocial circumstances: Secondary | ICD-10-CM

## 2015-05-29 DIAGNOSIS — M79605 Pain in left leg: Secondary | ICD-10-CM

## 2015-05-29 DIAGNOSIS — F439 Reaction to severe stress, unspecified: Secondary | ICD-10-CM

## 2015-05-29 DIAGNOSIS — L989 Disorder of the skin and subcutaneous tissue, unspecified: Secondary | ICD-10-CM

## 2015-05-29 DIAGNOSIS — E611 Iron deficiency: Secondary | ICD-10-CM

## 2015-05-29 NOTE — Progress Notes (Signed)
Pre-visit discussion using our clinic review tool. No additional management support is needed unless otherwise documented below in the visit note.  

## 2015-05-29 NOTE — Progress Notes (Signed)
Patient ID: MANDALYNN SINGH, female   DOB: 1955-10-11, 60 y.o.   MRN: [id number]   Subjective:    Patient ID: [name], female    DOB: 01/11/1956, 60 y.o.   MRN: [id number]  HPI  Patient with past history of GERD, back and leg pain and increased stress.  She comes in today to follow up on these issues.  She is due to see Dr Sharlet Salina 06/21/15.  Having increased pain left leg from her foot up to her hip.  No chest pain or tightness.  Had been trying to stay active.  Not exercising now with her leg pain.  No abdominal pain or cramping.  On protonix.  No acid reflux symptoms.  Bowels stable.     Past Medical History  Diagnosis Date  . GERD (gastroesophageal reflux disease)   . Colon polyps   . History of uterine fibroid    Past Surgical History  Procedure Laterality Date  . Uterine fibroid surgery  1997  . Breast surgery  1999    biopsy  . Foot surgery  02/07/1003    right foot benign tumor  . Carpal  2005    Tunnel right hand  . Sacroiliac joint injection  08/17/13 & 04/13/14    Dr. Sharlet Salina   Family History  Problem Relation Age of Onset  . Breast cancer Mother     double masectomy  . Arthritis Father   . GER disease Father   . Heart disease      paternal uncles  . Colon cancer Neg Hx    Social History   Social History  . Marital Status: Divorced    Spouse Name: N/A  . Number of Children: 0  . Years of Education: N/A   Social History Main Topics  . Smoking status: Never Smoker   . Smokeless tobacco: Never Used  . Alcohol Use: 0.0 oz/week    0 Standard drinks or equivalent per week  . Drug Use: No  . Sexual Activity: Not Asked   Other Topics Concern  . None   Social History Narrative     Review of Systems  Constitutional: Negative for appetite change and unexpected weight change.  HENT: Negative for congestion and sinus pressure.   Respiratory: Negative for cough, chest tightness and shortness of breath.   Cardiovascular: Negative for chest pain,  palpitations and leg swelling.  Gastrointestinal: Negative for nausea, vomiting, abdominal pain and diarrhea.  Genitourinary: Negative for dysuria and difficulty urinating.  Musculoskeletal:       Left leg pain and pain extending up from her foot to her hip.    Skin: Negative for color change and rash.  Neurological: Negative for dizziness, light-headedness and headaches.  Psychiatric/Behavioral: Negative for dysphoric mood and agitation.       Objective:    Physical Exam  Constitutional: She appears well-developed and well-nourished. No distress.  HENT:  Nose: Nose normal.  Mouth/Throat: Oropharynx is clear and moist.  Eyes: Conjunctivae are normal. Right eye exhibits no discharge. Left eye exhibits no discharge.  Neck: Neck supple. No thyromegaly present.  Cardiovascular: Normal rate and regular rhythm.   Pulmonary/Chest: Breath sounds normal. No respiratory distress. She has no wheezes.  Abdominal: Soft. Bowel sounds are normal. There is no tenderness.  Musculoskeletal: She exhibits no edema or tenderness.  Lymphadenopathy:    She has no cervical adenopathy.  Skin: No rash noted. No erythema.  Psychiatric: She has a normal mood and affect. Her behavior is normal.  BP 110/80 mmHg  Pulse 69  Temp(Src) 98.3 F (36.8 C) (Oral)  Resp 18  Ht 5\' 2"  (1.575 m)  Wt 136 lb 8 oz (61.916 kg)  BMI 24.96 kg/m2  SpO2 97%  LMP 02/26/2002 Wt Readings from Last 3 Encounters:  06/01/15 138 lb 6.4 oz (62.778 kg)  05/29/15 136 lb 8 oz (61.916 kg)  11/24/14 137 lb (62.143 kg)     Lab Results  Component Value Date   WBC 4.9 05/29/2015   HGB 12.4 05/29/2015   HCT 37.5 05/29/2015   PLT 289.0 05/29/2015   GLUCOSE 90 01/04/2015   CHOL 181 01/04/2015   TRIG 96.0 01/04/2015   HDL 62.70 01/04/2015   LDLCALC 99 01/04/2015   ALT 11 01/04/2015   AST 17 01/04/2015   NA 142 01/04/2015   K 3.9 01/09/2015   CL 105 01/04/2015   CREATININE 0.78 01/04/2015   BUN 17 01/04/2015   CO2 31  01/04/2015   TSH 2.27 05/16/2014    Mr Lumbar Spine Wo Contrast  09/14/2014  CLINICAL DATA:  Lumbar radiculopathy.  Severe right hip and leg pain EXAM: MRI LUMBAR SPINE WITHOUT CONTRAST TECHNIQUE: Multiplanar, multisequence MR imaging of the lumbar spine was performed. No intravenous contrast was administered. COMPARISON:  None. FINDINGS: Negative for fracture or mass lesion. Conus medullaris is normal and terminates at L1-2. L1-2:  Negative L2-3: Disc bulging and spondylosis. Mild narrowing of the canal without significant spinal stenosis. Mild foraminal narrowing bilaterally L3-4: Right-sided disc protrusion. Extruded disc fragment extending caudally behind the L3 vertebral body on the right. Disc protrusion extends into the right foramen with impingement of the right L3 nerve root in the foramen. Bilateral facet hypertrophy and mild spinal stenosis also noted. Left foramen patent. L4-5: 5 mm anterior slip. Disc degeneration and moderate facet degeneration. Mild spinal stenosis. Right foraminal encroachment with compression of the right L4 nerve root. Left foramen patent L5-S1: Disc degeneration left greater than right with disc bulging and spurring. Mild facet hypertrophy bilaterally. Mild left foraminal narrowing. IMPRESSION: L3-4 right-sided disc and protrusion with extruded disc fragment extending caudally behind the L3 vertebral body on the right. Disc protrusion extends into the right foramen with impingement of the right L3 nerve root. Grade 1 slip L4-5 . Right foraminal encroachment with compression of the right L4 nerve root. Electronically Signed   By: Franchot Gallo M.D.   On: 09/14/2014 08:29       Assessment & Plan:   Problem List Items Addressed This Visit    GERD (gastroesophageal reflux disease) - Primary    On protonix.  Symptoms controlled.        Leg pain, left    Has seen ortho.  Seeing Dr Sharlet Salina.  Left leg pain as outlined.  Had MRI as outlined.  Previous injection helped.   Increased pain now.  Has f/u planned 06/21/15.        Low iron    States when she goes to give blood, is told iron low.  Check cbc and ferritin today.        Relevant Orders   CBC with Differential/Platelet (Completed)   Ferritin (Completed)   Skin lesion    Request referral to Dr Nehemiah Massed.        Relevant Orders   Ambulatory referral to Dermatology   Stress    Feels she is handling stress relatively well.  Follow.            Einar Pheasant, MD

## 2015-05-30 LAB — FERRITIN: Ferritin: 8.4 ng/mL — ABNORMAL LOW (ref 10.0–291.0)

## 2015-05-30 LAB — CBC WITH DIFFERENTIAL/PLATELET
BASOS ABS: 0 10*3/uL (ref 0.0–0.1)
Basophils Relative: 0.1 % (ref 0.0–3.0)
Eosinophils Absolute: 0 10*3/uL (ref 0.0–0.7)
Eosinophils Relative: 0.9 % (ref 0.0–5.0)
HEMATOCRIT: 37.5 % (ref 36.0–46.0)
Hemoglobin: 12.4 g/dL (ref 12.0–15.0)
LYMPHS PCT: 20 % (ref 12.0–46.0)
Lymphs Abs: 1 10*3/uL (ref 0.7–4.0)
MCHC: 33.2 g/dL (ref 30.0–36.0)
MCV: 79.6 fl (ref 78.0–100.0)
MONOS PCT: 13.1 % — AB (ref 3.0–12.0)
Monocytes Absolute: 0.6 10*3/uL (ref 0.1–1.0)
Neutro Abs: 3.2 10*3/uL (ref 1.4–7.7)
Neutrophils Relative %: 65.9 % (ref 43.0–77.0)
Platelets: 289 10*3/uL (ref 150.0–400.0)
RBC: 4.71 Mil/uL (ref 3.87–5.11)
RDW: 16.2 % — ABNORMAL HIGH (ref 11.5–15.5)
WBC: 4.9 10*3/uL (ref 4.0–10.5)

## 2015-05-31 ENCOUNTER — Encounter: Payer: Self-pay | Admitting: *Deleted

## 2015-06-01 ENCOUNTER — Encounter: Payer: Self-pay | Admitting: Internal Medicine

## 2015-06-01 ENCOUNTER — Encounter: Payer: Self-pay | Admitting: Family Medicine

## 2015-06-01 ENCOUNTER — Ambulatory Visit (INDEPENDENT_AMBULATORY_CARE_PROVIDER_SITE_OTHER): Payer: BC Managed Care – PPO | Admitting: Family Medicine

## 2015-06-01 VITALS — BP 108/66 | HR 78 | Temp 102.7°F | Wt 138.4 lb

## 2015-06-01 DIAGNOSIS — J029 Acute pharyngitis, unspecified: Secondary | ICD-10-CM | POA: Diagnosis not present

## 2015-06-01 DIAGNOSIS — R52 Pain, unspecified: Secondary | ICD-10-CM

## 2015-06-01 DIAGNOSIS — J069 Acute upper respiratory infection, unspecified: Secondary | ICD-10-CM

## 2015-06-01 LAB — POCT INFLUENZA A/B
INFLUENZA B, POC: NEGATIVE
Influenza A, POC: NEGATIVE

## 2015-06-01 LAB — POCT RAPID STREP A (OFFICE): Rapid Strep A Screen: NEGATIVE

## 2015-06-01 NOTE — Assessment & Plan Note (Addendum)
Patient's symptoms most consistent with viral flulike illness. She had a negative rapid flu and rapid strep. She has no focal findings on exam to indicate bacterial illness. Benign lung exam and stable oxygenation and heart rate makes pneumonia unlikely. She appears well. She is febrile, though otherwise her vital signs are stable. I discussed that this is unlikely to be a bacterial illness given lack of focal findings. Advised that it could be a viral illness or flulike illness. Discussed treating her with Tamiflu given the constellation of symptoms seems to be consistent with flu despite the rapid flu test being negative. She declined Tamiflu at this time. Discussed supportive treatment. Over the counter Claritin. Can take Tylenol or ibuprofen for discomfort. Patient will continue to monitor. If not improved in the next 1-2 days she'll follow-up. She is given return precautions.

## 2015-06-01 NOTE — Progress Notes (Signed)
Patient ID: April Santos, female   DOB: July 08, 1955, 60 y.o.   MRN: [id number]  April Rumps, MD Phone: [phone]  April Santos is a 60 y.o. female who presents today for same-day visit.  Patient notes starting on Tuesday developed sore throat and minimal cough with minimal production. She notes some mild congestion with this. She has felt feverish but did not check her temperature. Body aches started yesterday. No sinus congestion, rhinorrhea, shortness breath, or chest pain. Notes she does feel she breathes better after taking hot shower. She has a history of strep throat in the past. She was visiting her mom in a nursing home recently. Staying well-hydrated.  PMH: nonsmoker.   ROS see history of present illness   Objective  Physical Exam Filed Vitals:   06/01/15 1003  BP: 108/66  Pulse: 78  Temp: 102.7 F (39.3 C)    BP Readings from Last 3 Encounters:  06/01/15 108/66  05/29/15 110/80  11/24/14 120/80   Wt Readings from Last 3 Encounters:  06/01/15 138 lb 6.4 oz (62.778 kg)  05/29/15 136 lb 8 oz (61.916 kg)  11/24/14 137 lb (62.143 kg)    Physical Exam  Constitutional: No distress.  HENT:  Head: Normocephalic and atraumatic.  Right Ear: External ear normal.  Left Ear: External ear normal.  Mild posterior oropharyngeal erythema, no exudates, no tonsillar swelling, normal TMs bilaterally, moist mucous membranes  Eyes: Conjunctivae are normal. Pupils are equal, round, and reactive to light.  Neck: Neck supple.  Cardiovascular: Normal rate, regular rhythm and normal heart sounds.  Exam reveals no gallop and no friction rub.   No murmur heard. Pulmonary/Chest: Effort normal and breath sounds normal. No respiratory distress. She has no wheezes. She has no rales.  Lymphadenopathy:    She has no cervical adenopathy.  Neurological: She is alert. Gait normal.  Skin: Skin is warm and dry. She is not diaphoretic.     Assessment/Plan: Please see individual  problem list.  Viral upper respiratory infection Patient's symptoms most consistent with viral flulike illness. She had a negative rapid flu and rapid strep. She has no focal findings on exam to indicate bacterial illness. Benign lung exam and stable oxygenation and heart rate makes pneumonia unlikely. She appears well. She is febrile, though otherwise her vital signs are stable. I discussed that this is unlikely to be a bacterial illness given lack of focal findings. Advised that it could be a viral illness or flulike illness. Discussed treating her with Tamiflu given the constellation of symptoms seems to be consistent with flu despite the rapid flu test being negative. She declined Tamiflu at this time. Discussed supportive treatment. Over the counter Claritin. Can take Tylenol or ibuprofen for discomfort. Patient will continue to monitor. If not improved in the next 1-2 days she'll follow-up. She is given return precautions.    Orders Placed This Encounter  Procedures  . POCT Influenza A/B  . POCT rapid strep A      April Santos

## 2015-06-01 NOTE — Patient Instructions (Addendum)
Nice to meet you. Your symptoms are likely related to a virus/flu like illness.  You can take over the counter claritin to help with this. You can also take tylenol or ibuprofen for any discomfort. Please stay well hydrated.  If you develop shortness of breath, chest pain, cough productive of blood, fever >103.48F, persistent fevers, or any new or change in symptoms please seek medical attention.

## 2015-06-01 NOTE — Progress Notes (Signed)
Pre visit review using our clinic review tool, if applicable. No additional management support is needed unless otherwise documented below in the visit note. 

## 2015-06-05 ENCOUNTER — Encounter: Payer: Self-pay | Admitting: Internal Medicine

## 2015-06-05 DIAGNOSIS — E611 Iron deficiency: Secondary | ICD-10-CM | POA: Insufficient documentation

## 2015-06-05 DIAGNOSIS — R21 Rash and other nonspecific skin eruption: Secondary | ICD-10-CM | POA: Insufficient documentation

## 2015-06-05 DIAGNOSIS — L989 Disorder of the skin and subcutaneous tissue, unspecified: Secondary | ICD-10-CM | POA: Insufficient documentation

## 2015-06-05 NOTE — Assessment & Plan Note (Signed)
On protonix.  Symptoms controlled.   

## 2015-06-05 NOTE — Assessment & Plan Note (Signed)
Request referral to Dr Nehemiah Massed.

## 2015-06-05 NOTE — Assessment & Plan Note (Signed)
Has seen ortho.  Seeing Dr Sharlet Salina.  Left leg pain as outlined.  Had MRI as outlined.  Previous injection helped.  Increased pain now.  Has f/u planned 06/21/15.

## 2015-06-05 NOTE — Assessment & Plan Note (Signed)
Feels she is handling stress relatively well.  Follow.

## 2015-06-05 NOTE — Assessment & Plan Note (Signed)
States when she goes to give blood, is told iron low.  Check cbc and ferritin today.

## 2015-06-10 NOTE — Telephone Encounter (Signed)
Resent mychart message since response was never received.

## 2015-06-14 ENCOUNTER — Encounter: Payer: Self-pay | Admitting: *Deleted

## 2015-06-19 ENCOUNTER — Encounter: Payer: Self-pay | Admitting: Internal Medicine

## 2015-06-19 DIAGNOSIS — D649 Anemia, unspecified: Secondary | ICD-10-CM

## 2015-06-20 MED ORDER — INTEGRA 62.5-62.5-40-3 MG PO CAPS: 1.0000 | ORAL_CAPSULE | Freq: Every day | ORAL | Status: DC

## 2015-06-20 NOTE — Telephone Encounter (Signed)
rx sent in for integra #30 with 2 refills.

## 2015-06-20 NOTE — Telephone Encounter (Signed)
I have placed the order for the GI referral.  Regarding her mother, please call and inform if increased pain and difficulty walking, needs evaluation.  I recommend evaluation today.  Acute care - [facility].

## 2015-06-20 NOTE — Addendum Note (Signed)
Addended by: Alisa Graff on: 06/20/2015 01:09 PM   Modules accepted: Orders

## 2015-07-07 LAB — HM MAMMOGRAPHY: HM Mammogram: NORMAL (ref 0–4)

## 2015-08-26 ENCOUNTER — Other Ambulatory Visit: Payer: Self-pay | Admitting: Internal Medicine

## 2015-09-04 ENCOUNTER — Encounter: Payer: Self-pay | Admitting: Internal Medicine

## 2015-09-04 DIAGNOSIS — Z0000 Encounter for general adult medical examination without abnormal findings: Secondary | ICD-10-CM

## 2015-09-04 NOTE — Assessment & Plan Note (Signed)
Mammogram 06/09/15 [facility]) - [place] [facility].

## 2015-10-02 ENCOUNTER — Encounter: Payer: Self-pay | Admitting: Internal Medicine

## 2015-10-02 ENCOUNTER — Ambulatory Visit (INDEPENDENT_AMBULATORY_CARE_PROVIDER_SITE_OTHER): Payer: BC Managed Care – PPO | Admitting: Internal Medicine

## 2015-10-02 VITALS — BP 120/70 | HR 62 | Temp 98.1°F | Resp 18 | Ht 62.0 in | Wt 137.2 lb

## 2015-10-02 DIAGNOSIS — R799 Abnormal finding of blood chemistry, unspecified: Secondary | ICD-10-CM | POA: Diagnosis not present

## 2015-10-02 DIAGNOSIS — K219 Gastro-esophageal reflux disease without esophagitis: Secondary | ICD-10-CM

## 2015-10-02 DIAGNOSIS — R928 Other abnormal and inconclusive findings on diagnostic imaging of breast: Secondary | ICD-10-CM

## 2015-10-02 DIAGNOSIS — R790 Abnormal level of blood mineral: Secondary | ICD-10-CM

## 2015-10-02 DIAGNOSIS — F439 Reaction to severe stress, unspecified: Secondary | ICD-10-CM

## 2015-10-02 DIAGNOSIS — E611 Iron deficiency: Secondary | ICD-10-CM

## 2015-10-02 DIAGNOSIS — Z658 Other specified problems related to psychosocial circumstances: Secondary | ICD-10-CM

## 2015-10-02 DIAGNOSIS — D509 Iron deficiency anemia, unspecified: Secondary | ICD-10-CM

## 2015-10-02 NOTE — Progress Notes (Signed)
Pre-visit discussion using our clinic review tool. No additional management support is needed unless otherwise documented below in the visit note.  

## 2015-10-02 NOTE — Progress Notes (Signed)
Patient ID: April Santos, female   DOB: [birth year], 60 y.o.   MRN: [id number]   Subjective:    Patient ID: April Santos, female    DOB: 03-13-1956, 60 y.o.   MRN: [id number]  HPI  Patient here for a scheduled follow up.  She has been having problems with persistent hip pain.  She has seen Dr Sharlet Salina and orthopedics.  Is s/p 2 more injections.  Did not help.  Was on meloxicam.  Felt not helping.  Back on etodolac.  No upsetting her stomach.  She saw ortho recently.  Recommended physical therapy.  She is unable to do this until august.  No chest pain.  No sob.  No acid reflux. No abdominal pain or cramping.  Bowels stable.  She is taking iron, but only taking 2-3x/week.     Past Medical History  Diagnosis Date  . GERD (gastroesophageal reflux disease)   . Colon polyps   . History of uterine fibroid    Past Surgical History  Procedure Laterality Date  . Uterine fibroid surgery  1997  . Breast surgery  1999    biopsy  . Foot surgery  02/07/1003    right foot benign tumor  . Carpal  2005    Tunnel right hand  . Sacroiliac joint injection  08/17/13 & 04/13/14    Dr. Sharlet Salina   Family History  Problem Relation Age of Onset  . Breast cancer Mother     double masectomy  . Arthritis Father   . GER disease Father   . Heart disease      paternal uncles  . Colon cancer Neg Hx    Social History   Social History  . Marital Status: Divorced    Spouse Name: N/A  . Number of Children: 0  . Years of Education: N/A   Social History Main Topics  . Smoking status: Never Smoker   . Smokeless tobacco: Never Used  . Alcohol Use: 0.0 oz/week    0 Standard drinks or equivalent per week  . Drug Use: No  . Sexual Activity: Not Asked   Other Topics Concern  . None   Social History Narrative    Outpatient Encounter Prescriptions as of 10/02/2015  Medication Sig  . ALPRAZolam (XANAX) 0.25 MG tablet Take 1 tablet (0.25 mg total) by mouth at bedtime as needed.  Marland Kitchen BIOTIN PO Take by mouth.    . Calcium Citrate (CITRACAL PO) Take by mouth 4 (four) times daily.  . cyclobenzaprine (FLEXERIL) 5 MG tablet TAKE ONE TABLET BY MOUTH AT BEDTIME AS NEEDED  . estradiol (ESTRACE) 0.1 MG/GM vaginal cream Place 2 g vaginally 2 (two) times a week.  . estradiol (ESTRACE) 1 MG tablet Take 1 tablet (1 mg total) by mouth daily.  . fluticasone (CUTIVATE) 0.05 % cream Apply 1 application topically as needed.  Marland Kitchen HYDROcodone-acetaminophen (NORCO) 5-325 MG per tablet 1/2 - 1 tablet q 8 hours prn  . IRON PO Take by mouth.  . meloxicam (MOBIC) 15 MG tablet TAKE ONE TABLET EVERY DAY FOR PAIN  . Multiple Vitamins-Minerals (MULTIVITAL PO) Take by mouth daily.  . pantoprazole (PROTONIX) 40 MG tablet TAKE ONE TABLET BY MOUTH EVERY DAY  . [DISCONTINUED] Bepotastine Besilate (BEPREVE) 1.5 % SOLN as needed.  . [DISCONTINUED] Fe Fum-FePoly-Vit C-Vit B3 (INTEGRA) 62.5-62.5-40-3 MG CAPS Take 1 capsule by mouth daily.  . [DISCONTINUED] gabapentin (NEURONTIN) 300 MG capsule Take 300 mg by mouth 2 (two) times daily.  . [DISCONTINUED] hydrOXYzine (ATARAX/VISTARIL) 25  MG tablet Take 25 mg by mouth 3 (three) times daily.  . [DISCONTINUED] montelukast (SINGULAIR) 10 MG tablet Take 10 mg by mouth at bedtime.   . [DISCONTINUED] Olopatadine HCl (PATANASE) 0.6 % SOLN Place into the nose as needed.  . etodolac (LODINE) 500 MG tablet    No facility-administered encounter medications on file as of 10/02/2015.    Review of Systems  Constitutional: Negative for appetite change and unexpected weight change.  HENT: Negative for congestion and sinus pressure.   Respiratory: Negative for cough, chest tightness and shortness of breath.   Cardiovascular: Negative for chest pain, palpitations and leg swelling.  Gastrointestinal: Negative for nausea, vomiting, abdominal pain and diarrhea.  Genitourinary: Negative for dysuria and difficulty urinating.  Musculoskeletal: Positive for back pain. Negative for joint swelling.  Skin:  Negative for color change and rash.  Neurological: Negative for dizziness, light-headedness and headaches.  Psychiatric/Behavioral: Negative for dysphoric mood and agitation.       Objective:    Physical Exam  Constitutional: She appears well-developed and well-nourished. No distress.  HENT:  Nose: Nose normal.  Mouth/Throat: Oropharynx is clear and moist.  Neck: Neck supple. No thyromegaly present.  Cardiovascular: Normal rate and regular rhythm.   Pulmonary/Chest: Breath sounds normal. No respiratory distress. She has no wheezes.  Abdominal: Soft. Bowel sounds are normal. There is no tenderness.  Musculoskeletal: She exhibits no edema or tenderness.  Lymphadenopathy:    She has no cervical adenopathy.  Skin: No rash noted. No erythema.  Psychiatric: She has a normal mood and affect. Her behavior is normal.    BP 120/70 mmHg  Pulse 62  Temp(Src) 98.1 F (36.7 C) (Oral)  Resp 18  Ht 5\' 2"  (1.575 m)  Wt 137 lb 4 oz (62.256 kg)  BMI 25.10 kg/m2  SpO2 97%  LMP 02/26/2002 Wt Readings from Last 3 Encounters:  10/02/15 137 lb 4 oz (62.256 kg)  06/01/15 138 lb 6.4 oz (62.778 kg)  05/29/15 136 lb 8 oz (61.916 kg)     Lab Results  Component Value Date   WBC 4.6 10/02/2015   HGB 13.2 10/02/2015   HCT 39.7 10/02/2015   PLT 278.0 10/02/2015   GLUCOSE 90 01/04/2015   CHOL 181 01/04/2015   TRIG 96.0 01/04/2015   HDL 62.70 01/04/2015   LDLCALC 99 01/04/2015   ALT 11 01/04/2015   AST 17 01/04/2015   NA 142 01/04/2015   K 3.9 01/09/2015   CL 105 01/04/2015   CREATININE 0.78 01/04/2015   BUN 17 01/04/2015   CO2 31 01/04/2015   TSH 2.27 05/16/2014    Mr Lumbar Spine Wo Contrast  09/14/2014  CLINICAL DATA:  Lumbar radiculopathy.  Severe right hip and leg pain EXAM: MRI LUMBAR SPINE WITHOUT CONTRAST TECHNIQUE: Multiplanar, multisequence MR imaging of the lumbar spine was performed. No intravenous contrast was administered. COMPARISON:  None. FINDINGS: Negative for fracture  or mass lesion. Conus medullaris is normal and terminates at L1-2. L1-2:  Negative L2-3: Disc bulging and spondylosis. Mild narrowing of the canal without significant spinal stenosis. Mild foraminal narrowing bilaterally L3-4: Right-sided disc protrusion. Extruded disc fragment extending caudally behind the L3 vertebral body on the right. Disc protrusion extends into the right foramen with impingement of the right L3 nerve root in the foramen. Bilateral facet hypertrophy and mild spinal stenosis also noted. Left foramen patent. L4-5: 5 mm anterior slip. Disc degeneration and moderate facet degeneration. Mild spinal stenosis. Right foraminal encroachment with compression of the right L4 nerve  root. Left foramen patent L5-S1: Disc degeneration left greater than right with disc bulging and spurring. Mild facet hypertrophy bilaterally. Mild left foraminal narrowing. IMPRESSION: L3-4 right-sided disc and protrusion with extruded disc fragment extending caudally behind the L3 vertebral body on the right. Disc protrusion extends into the right foramen with impingement of the right L3 nerve root. Grade 1 slip L4-5 . Right foraminal encroachment with compression of the right L4 nerve root. Electronically Signed   By: Franchot Gallo M.D.   On: 09/14/2014 08:29       Assessment & Plan:   Problem List Items Addressed This Visit    Abnormal mammogram    Mammogram 07/07/15 - [facility].        GERD (gastroesophageal reflux disease)    On protonix.  Upper symptoms controlled.        Low iron    On iron supplements 2-3x/week.  Recheck cbc and iron stores.        Stress    Increased stress with her mother's health issues.  Discussed with her today.  Feels she is handling things relatively well.  Follow closely.         Other Visit Diagnoses    Low iron stores    -  Primary    Relevant Orders    CBC with Differential/Platelet (Completed)    Ferritin (Completed)      I spent 25 minutes with the patient and  more than 50% of the time was spent in consultation regarding the above.     Einar Pheasant, MD

## 2015-10-03 LAB — CBC WITH DIFFERENTIAL/PLATELET
Basophils Absolute: 0 10*3/uL (ref 0.0–0.1)
Basophils Relative: 0.5 % (ref 0.0–3.0)
Eosinophils Absolute: 0.1 10*3/uL (ref 0.0–0.7)
Eosinophils Relative: 1.2 % (ref 0.0–5.0)
HCT: 39.7 % (ref 36.0–46.0)
Hemoglobin: 13.2 g/dL (ref 12.0–15.0)
LYMPHS ABS: 2 10*3/uL (ref 0.7–4.0)
Lymphocytes Relative: 43.6 % (ref 12.0–46.0)
MCHC: 33.2 g/dL (ref 30.0–36.0)
MCV: 84.8 fl (ref 78.0–100.0)
MONO ABS: 0.5 10*3/uL (ref 0.1–1.0)
Monocytes Relative: 10.8 % (ref 3.0–12.0)
NEUTROS PCT: 43.9 % (ref 43.0–77.0)
Neutro Abs: 2 10*3/uL (ref 1.4–7.7)
Platelets: 278 10*3/uL (ref 150.0–400.0)
RBC: 4.68 Mil/uL (ref 3.87–5.11)
RDW: 15 % (ref 11.5–15.5)
WBC: 4.6 10*3/uL (ref 4.0–10.5)

## 2015-10-03 LAB — FERRITIN: FERRITIN: 12.6 ng/mL (ref 10.0–291.0)

## 2015-10-04 ENCOUNTER — Encounter: Payer: Self-pay | Admitting: Internal Medicine

## 2015-10-06 NOTE — Telephone Encounter (Signed)
Unread mychart message mailed to patient 

## 2015-10-08 ENCOUNTER — Encounter: Payer: Self-pay | Admitting: Internal Medicine

## 2015-10-08 NOTE — Assessment & Plan Note (Signed)
On protonix.  Upper symptoms controlled.  

## 2015-10-08 NOTE — Assessment & Plan Note (Signed)
Mammogram 07/07/15 - [facility] II.

## 2015-10-08 NOTE — Assessment & Plan Note (Signed)
On iron supplements 2-3x/week.  Recheck cbc and iron stores.

## 2015-10-08 NOTE — Assessment & Plan Note (Signed)
Increased stress with her mother's health issues.  Discussed with her today.  Feels she is handling things relatively well.  Follow closely.

## 2015-12-07 ENCOUNTER — Other Ambulatory Visit: Payer: Self-pay | Admitting: Internal Medicine

## 2015-12-07 MED ORDER — ALPRAZOLAM 0.25 MG PO TABS: 0.2500 mg | ORAL_TABLET | Freq: Every evening | ORAL | 0 refills | Status: DC | PRN

## 2015-12-07 NOTE — Telephone Encounter (Signed)
Faxed to [facility]. Renaldo Fiddler, CMA

## 2015-12-07 NOTE — Telephone Encounter (Signed)
ok'd rx for alprazolam #30 with no refills.   

## 2015-12-07 NOTE — Telephone Encounter (Signed)
Last fill was sent in 02/10/2013. See pt note attached with request. Last seen 10/02/15.

## 2015-12-18 ENCOUNTER — Encounter: Payer: Self-pay | Admitting: Internal Medicine

## 2015-12-18 DIAGNOSIS — R21 Rash and other nonspecific skin eruption: Secondary | ICD-10-CM

## 2015-12-18 NOTE — Telephone Encounter (Signed)
See my chart message.  She has dermatology appt.  States needs referral. Order placed.

## 2016-01-02 ENCOUNTER — Other Ambulatory Visit: Payer: Self-pay | Admitting: Internal Medicine

## 2016-01-02 ENCOUNTER — Encounter: Payer: Self-pay | Admitting: Internal Medicine

## 2016-01-02 NOTE — Telephone Encounter (Signed)
Last seen 10/02/15

## 2016-01-03 MED ORDER — ALPRAZOLAM 0.25 MG PO TABS: 0.2500 mg | ORAL_TABLET | Freq: Every evening | ORAL | 0 refills | Status: DC | PRN

## 2016-01-03 NOTE — Telephone Encounter (Signed)
faxed

## 2016-02-05 ENCOUNTER — Ambulatory Visit (INDEPENDENT_AMBULATORY_CARE_PROVIDER_SITE_OTHER): Payer: BC Managed Care – PPO

## 2016-02-05 ENCOUNTER — Encounter: Payer: Self-pay | Admitting: Internal Medicine

## 2016-02-05 ENCOUNTER — Ambulatory Visit (INDEPENDENT_AMBULATORY_CARE_PROVIDER_SITE_OTHER): Payer: BC Managed Care – PPO | Admitting: Internal Medicine

## 2016-02-05 DIAGNOSIS — M25512 Pain in left shoulder: Secondary | ICD-10-CM

## 2016-02-05 DIAGNOSIS — L409 Psoriasis, unspecified: Secondary | ICD-10-CM

## 2016-02-05 DIAGNOSIS — Z23 Encounter for immunization: Secondary | ICD-10-CM

## 2016-02-05 DIAGNOSIS — F439 Reaction to severe stress, unspecified: Secondary | ICD-10-CM

## 2016-02-05 DIAGNOSIS — K219 Gastro-esophageal reflux disease without esophagitis: Secondary | ICD-10-CM

## 2016-02-05 DIAGNOSIS — E611 Iron deficiency: Secondary | ICD-10-CM
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
## 2016-02-05 NOTE — Progress Notes (Signed)
Pre visit review using our clinic review tool, if applicable. No additional management support is needed unless otherwise documented below in the visit note. 

## 2016-02-09 NOTE — Progress Notes (Deleted)
Order placed for ortho referral.   

## 2016-02-11 ENCOUNTER — Encounter: Payer: Self-pay | Admitting: Internal Medicine

## 2016-02-11 DIAGNOSIS — L4050 Arthropathic psoriasis, unspecified: Secondary | ICD-10-CM | POA: Insufficient documentation
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
## 2016-02-11 NOTE — Assessment & Plan Note (Signed)
Saw GI.  Planning for EGD and colonoscopy.

## 2016-02-11 NOTE — Assessment & Plan Note (Signed)
On protonix.  Symptoms controlled.   

## 2016-02-11 NOTE — Assessment & Plan Note (Signed)
Increased stress with her mother's health issues.  She feels she is handling things relatively well.  Discussed with her today.  Follow.

## 2016-02-11 NOTE — Assessment & Plan Note (Signed)
Being followed by dermatology.  

## 2016-03-06 ENCOUNTER — Encounter: Payer: Self-pay | Admitting: Internal Medicine

## 2016-03-06 NOTE — Telephone Encounter (Signed)
Reviewed message.  She does need to be seen.  Can see if anyone has opening for evaluation.  Would like for her to be seen to confirm etiology.

## 2016-03-07 ENCOUNTER — Encounter: Payer: Self-pay | Admitting: Internal Medicine

## 2016-03-07 ENCOUNTER — Telehealth: Payer: Self-pay | Admitting: *Deleted

## 2016-03-07 NOTE — Telephone Encounter (Signed)
Pt stated that she much rather wait until Monday to be seen by Dr. Caryl Bis on Monday, due to her schedule being booked tomorrow.  Pt contact [phone]

## 2016-03-07 NOTE — Telephone Encounter (Signed)
noted 

## 2016-03-07 NOTE — Telephone Encounter (Signed)
If her hand is swelling and acutely just started swelling, etc, I would prefer her to be seen before next week.  If no one has opening here, then acute care or urgent care and then I can f/u with her.  If the etodolac is not bothering her stomach, she can take this medication, but I still want her seen.  Let her know I just don't have anything this week.

## 2016-03-11 ENCOUNTER — Ambulatory Visit (INDEPENDENT_AMBULATORY_CARE_PROVIDER_SITE_OTHER): Payer: BC Managed Care – PPO

## 2016-03-11 ENCOUNTER — Encounter: Payer: Self-pay | Admitting: Family Medicine

## 2016-03-11 ENCOUNTER — Ambulatory Visit (INDEPENDENT_AMBULATORY_CARE_PROVIDER_SITE_OTHER): Payer: BC Managed Care – PPO | Admitting: Family Medicine

## 2016-03-11 VITALS — BP 124/76 | HR 67 | Temp 98.4°F | Wt 139.2 lb

## 2016-03-11 DIAGNOSIS — M79641 Pain in right hand: Secondary | ICD-10-CM | POA: Diagnosis not present

## 2016-03-11 DIAGNOSIS — L409 Psoriasis, unspecified: Secondary | ICD-10-CM | POA: Diagnosis not present

## 2016-03-11 NOTE — Progress Notes (Signed)
Tommi Rumps, MD Phone: [phone]  April Santos is a 60 y.o. female who presents today for same-day visit.  Patient notes 2 weeks ago she had onset of right index finger and thumb pain and swelling. Notes it was erythematous from the PIP to the tip of her index finger and part of her thumb. Notes she had some difficulty flexing it at that time. Some warmth. She could feel it pulsating. She notes no fevers. No spreading redness with it. She took etodolac and this was beneficial. She notes no injury. No bug bite or sting. She notes no causative factor. She notes it has improved quite a bit since the first 3 days. She is able to flex it though does feel some mild tightness. She notes the swelling has gone down. The redness has improved. No numbness or weakness. Does have a history of psoriasis recently diagnosed. She's been on triamcinolone for rash on her right knee and foot. This has helped some though not completely.  ROS see history of present illness  Objective  Physical Exam Vitals:   03/11/16 1033  BP: 124/76  Pulse: 67  Temp: 98.4 F (36.9 C)    BP Readings from Last 3 Encounters:  03/11/16 124/76  02/05/16 112/80  10/02/15 120/70   Wt Readings from Last 3 Encounters:  03/11/16 139 lb 3.2 oz (63.1 kg)  02/05/16 136 lb (61.7 kg)  10/02/15 137 lb 4 oz (62.3 kg)    Physical Exam  Constitutional: No distress.  Cardiovascular: Normal rate, regular rhythm and normal heart sounds.   Pulmonary/Chest: Effort normal and breath sounds normal.  Musculoskeletal: She exhibits no edema.  Right index finger with minimal swelling, minimal erythema just distal to the DIP joint dorsally, mild tenderness of this area, no fluctuance or induration, no warmth, full active and passive range of motion right index finger and right thumb, no swelling of the thumb, good capillary refill index finger and thumb, sensation light touch intact in right hand, grip strength intact in right hand,  left hand with no swelling, warmth, or erythema, full range of motion  Neurological: She is alert. Gait normal.  Skin: Skin is warm and dry. She is not diaphoretic.  Erythematous plaque noted on anterior right knee and right dorsum of foot, nontender, no warmth     Assessment/Plan: Please see individual problem list.  Right hand pain Patient with several weeks of discomfort in her right index finger and right thumb that has been improving over the last week and a half or so. Some mild swelling and tenderness today though otherwise relatively benign exam. She is neurovascularly intact. Good range of motion on exam today. Doubt infectious cause given improvement with no intervention. She does have a history of psoriasis making me wonder whether or not this could be underlying psoriatic arthritis. We will obtain an x-ray of her right hand to evaluate further. She will continue to monitor. Given return precautions.  Psoriasis Has been on triamcinolone. She wonders if this is effective enough given persistent rash. I advised that she should contact her dermatologist to see what the next step is.   Orders Placed This Encounter  Procedures  . DG Hand Complete Right    Standing Status:   Future    Number of Occurrences:   1    Standing Expiration Date:   05/12/2017    Order Specific Question:   Reason for Exam (SYMPTOM  OR DIAGNOSIS REQUIRED)    Answer:   2 weeks of  pain and swelling in right index finger and thumb, improving, though still some discomfort    Order Specific Question:   Preferred imaging location?    Answer:   [facility]    Tommi Rumps, MD [place]

## 2016-03-11 NOTE — Assessment & Plan Note (Signed)
Has been on triamcinolone. She wonders if this is effective enough given persistent rash. I advised that she should contact her dermatologist to see what the next step is.

## 2016-03-11 NOTE — Progress Notes (Signed)
Pre visit review using our clinic review tool, if applicable. No additional management support is needed unless otherwise documented below in the visit note. 

## 2016-03-11 NOTE — Patient Instructions (Signed)
Nice to see you. We are going to obtain an x-ray of your right hand to evaluate for a cause of the swelling and pain you had. Please continue to monitor this area and if you have recurrent pain, swelling, warmth, redness, inability to flex her index finger, or any new or changing symptoms please seek medical attention immediately. Please contact your dermatologist to check on the next step for management of your psoriasis.

## 2016-03-11 NOTE — Assessment & Plan Note (Signed)
Patient with several weeks of discomfort in her right index finger and right thumb that has been improving over the last week and a half or so. Some mild swelling and tenderness today though otherwise relatively benign exam. She is neurovascularly intact. Good range of motion on exam today. Doubt infectious cause given improvement with no intervention. She does have a history of psoriasis making me wonder whether or not this could be underlying psoriatic arthritis. We will obtain an x-ray of her right hand to evaluate further. She will continue to monitor. Given return precautions.

## 2016-04-03 ENCOUNTER — Other Ambulatory Visit: Payer: Self-pay | Admitting: Internal Medicine

## 2016-04-04 NOTE — Telephone Encounter (Signed)
Please clarify with pt if she needs this medication.

## 2016-04-04 NOTE — Telephone Encounter (Signed)
Pt last refill on Etodolac was on 09/07/15 and was last seen 03/11/16 for right hand pain. Pt has scheduled appt for February 05/10/16. Ok to refill?

## 2016-04-15 ENCOUNTER — Encounter: Payer: Self-pay | Admitting: Internal Medicine

## 2016-05-10 ENCOUNTER — Encounter: Payer: BC Managed Care – PPO | Admitting: Internal Medicine

## 2016-06-03 ENCOUNTER — Ambulatory Visit (INDEPENDENT_AMBULATORY_CARE_PROVIDER_SITE_OTHER): Payer: BC Managed Care – PPO

## 2016-06-03 ENCOUNTER — Encounter: Payer: Self-pay | Admitting: Internal Medicine

## 2016-06-03 ENCOUNTER — Ambulatory Visit (INDEPENDENT_AMBULATORY_CARE_PROVIDER_SITE_OTHER): Payer: BC Managed Care – PPO | Admitting: Internal Medicine

## 2016-06-03 VITALS — BP 110/62 | HR 72 | Temp 98.6°F | Ht 62.0 in | Wt 141.8 lb

## 2016-06-03 DIAGNOSIS — K219 Gastro-esophageal reflux disease without esophagitis: Secondary | ICD-10-CM

## 2016-06-03 DIAGNOSIS — E611 Iron deficiency: Secondary | ICD-10-CM

## 2016-06-03 DIAGNOSIS — F439 Reaction to severe stress, unspecified: Secondary | ICD-10-CM

## 2016-06-03 DIAGNOSIS — M2550 Pain in unspecified joint: Secondary | ICD-10-CM

## 2016-06-03 DIAGNOSIS — L4050 Arthropathic psoriasis, unspecified: Secondary | ICD-10-CM | POA: Diagnosis not present

## 2016-06-03 DIAGNOSIS — M79605 Pain in left leg: Secondary | ICD-10-CM

## 2016-06-03 DIAGNOSIS — M25512 Pain in left shoulder: Secondary | ICD-10-CM

## 2016-06-03 NOTE — Progress Notes (Signed)
Pre-visit discussion using our clinic review tool. No additional management support is needed unless otherwise documented below in the visit note.  

## 2016-06-03 NOTE — Progress Notes (Signed)
Patient ID: April Santos, female   DOB: [birth year], 61 y.o.   MRN: [id number]   Subjective:    Patient ID: April Santos, female    DOB: Mar 27, 1956, 61 y.o.   MRN: [id number]  HPI  Patient here for a scheduled follow up.  Seeing Dr Nehemiah Massed.  Diagnosed with psoriatic arthritis.  On otezla.  Has f/u in 3 weeks.  She also reports that starting 3 weeks ago, she began having increased left shoulder pain.  Couldn't raise her shoulder.  Hurts from her shoulder to her elbow.  Having left hip flare.  Some pain - outer leg to left hip.  Aggravated by sitting at a computer for long periods.  Sees ortho.  No chest pain.  Breathing stable.  Increased stress with her mother.  Overall handling things relatively well.  Taking lodine.  No GI upset currently.     Past Medical History:  Diagnosis Date  . Arthritis   . Colon polyps   . Disc disorder   . Dysphagia   . GERD (gastroesophageal reflux disease)   . History of uterine fibroid    Past Surgical History:  Procedure Laterality Date  . ABDOMINAL HYSTERECTOMY    . BREAST SURGERY  1999   biopsy  . carpal  2005   Tunnel right hand  . COLONOSCOPY WITH ESOPHAGOGASTRODUODENOSCOPY (EGD)    . COLONOSCOPY WITH PROPOFOL N/A 06/10/2016   Procedure: COLONOSCOPY WITH PROPOFOL;  Surgeon: Manya Silvas, MD;  Location: [facility] ENDOSCOPY;  Service: Endoscopy;  Laterality: N/A;  . ESOPHAGOGASTRODUODENOSCOPY (EGD) WITH PROPOFOL N/A 06/10/2016   Procedure: ESOPHAGOGASTRODUODENOSCOPY (EGD) WITH PROPOFOL;  Surgeon: Manya Silvas, MD;  Location: [facility] ENDOSCOPY;  Service: Endoscopy;  Laterality: N/A;  . FOOT SURGERY  02/07/1003   right foot benign tumor  . SACROILIAC JOINT INJECTION  08/17/13 & 04/13/14   Dr. Sharlet Salina  . SIGMOIDOSCOPY    . UTERINE FIBROID SURGERY  1997   Family History  Problem Relation Age of Onset  . Breast cancer Mother     double masectomy  . Arthritis Father   . GER disease Father   . Heart disease      paternal uncles  . Colon cancer Neg  Hx    Social History   Social History  . Marital status: Divorced    Spouse name: N/A  . Number of children: 0  . Years of education: N/A   Social History Main Topics  . Smoking status: Never Smoker  . Smokeless tobacco: Never Used  . Alcohol use 0.6 oz/week    1 Glasses of wine per week     Comment: bimonthly  . Drug use: No  . Sexual activity: Not Asked   Other Topics Concern  . None   Social History Narrative  . None    Outpatient Encounter Prescriptions as of 06/03/2016  Medication Sig  . ALPRAZolam (XANAX) 0.25 MG tablet Take 1 tablet (0.25 mg total) by mouth at bedtime as needed.  Marland Kitchen Apremilast (OTEZLA) 30 MG TABS Take 1 tablet by mouth daily.  Marland Kitchen BIOTIN PO Take by mouth.  . Calcium Citrate (CITRACAL PO) Take by mouth 4 (four) times daily.  . cyclobenzaprine (FLEXERIL) 5 MG tablet TAKE ONE TABLET BY MOUTH AT BEDTIME AS NEEDED  . estradiol (ESTRACE) 0.1 MG/GM vaginal cream Place 2 g vaginally 2 (two) times a week.  . estradiol (ESTRACE) 1 MG tablet Take 1 tablet (1 mg total) by mouth daily.  Marland Kitchen etodolac (LODINE) 500 MG tablet   .  fluticasone (CUTIVATE) 0.05 % cream Apply 1 application topically as needed.  Marland Kitchen HYDROcodone-acetaminophen (NORCO) 5-325 MG per tablet 1/2 - 1 tablet q 8 hours prn  . IRON PO Take by mouth.  . meloxicam (MOBIC) 15 MG tablet TAKE ONE TABLET EVERY DAY FOR PAIN (Patient not taking: Reported on 06/10/2016)  . Multiple Vitamins-Minerals (MULTIVITAL PO) Take by mouth daily.  . pantoprazole (PROTONIX) 40 MG tablet TAKE ONE TABLET BY MOUTH EVERY DAY  . triamcinolone cream (KENALOG) 0.1 % Apply 1 application topically 2 (two) times daily.   No facility-administered encounter medications on file as of 06/03/2016.     Review of Systems  Constitutional: Negative for appetite change and unexpected weight change.  HENT: Negative for congestion and sinus pressure.   Respiratory: Negative for cough, chest tightness and shortness of breath.   Cardiovascular:  Negative for chest pain, palpitations and leg swelling.  Gastrointestinal: Negative for abdominal pain, diarrhea, nausea and vomiting.  Genitourinary: Negative for difficulty urinating and dysuria.  Musculoskeletal: Negative for back pain and joint swelling.       Left shoulder pain as outlined.   Skin: Negative for color change and rash.  Neurological: Negative for dizziness, light-headedness and headaches.  Psychiatric/Behavioral: Negative for agitation and dysphoric mood.       Objective:    Physical Exam  Constitutional: She appears well-developed and well-nourished. No distress.  HENT:  Nose: Nose normal.  Mouth/Throat: Oropharynx is clear and moist.  Neck: Neck supple. No thyromegaly present.  Cardiovascular: Normal rate and regular rhythm.   Pulmonary/Chest: Breath sounds normal. No respiratory distress. She has no wheezes.  Abdominal: Soft. Bowel sounds are normal. There is no tenderness.  Musculoskeletal: She exhibits no edema or tenderness.  Lymphadenopathy:    She has no cervical adenopathy.  Skin: No rash noted. No erythema.  Psychiatric: She has a normal mood and affect. Her behavior is normal.    BP 110/62 (BP Location: Left Arm, Patient Position: Sitting, Cuff Size: Large)   Pulse 72   Temp 98.6 F (37 C) (Oral)   Ht 5\' 2"  (1.575 m)   Wt 141 lb 12.8 oz (64.3 kg)   LMP 02/26/2002   SpO2 98%   BMI 25.94 kg/m  Wt Readings from Last 3 Encounters:  06/10/16 141 lb (64 kg)  06/03/16 141 lb 12.8 oz (64.3 kg)  03/11/16 139 lb 3.2 oz (63.1 kg)     Lab Results  Component Value Date   WBC 4.5 06/03/2016   HGB 11.8 (L) 06/03/2016   HCT 35.8 (L) 06/03/2016   PLT 389.0 06/03/2016   GLUCOSE 97 06/03/2016   CHOL 181 01/04/2015   TRIG 96.0 01/04/2015   HDL 62.70 01/04/2015   LDLCALC 99 01/04/2015   ALT 12 06/03/2016   AST 18 06/03/2016   NA 141 06/03/2016   K 4.6 06/03/2016   CL 105 06/03/2016   CREATININE 0.87 06/03/2016   BUN 11 06/03/2016   CO2 28  06/03/2016   TSH 2.27 05/16/2014    Mr Lumbar Spine Wo Contrast  Result Date: 09/14/2014 CLINICAL DATA:  Lumbar radiculopathy.  Severe right hip and leg pain EXAM: MRI LUMBAR SPINE WITHOUT CONTRAST TECHNIQUE: Multiplanar, multisequence MR imaging of the lumbar spine was performed. No intravenous contrast was administered. COMPARISON:  None. FINDINGS: Negative for fracture or mass lesion. Conus medullaris is normal and terminates at L1-2. L1-2:  Negative L2-3: Disc bulging and spondylosis. Mild narrowing of the canal without significant spinal stenosis. Mild foraminal narrowing bilaterally L3-4:  Right-sided disc protrusion. Extruded disc fragment extending caudally behind the L3 vertebral body on the right. Disc protrusion extends into the right foramen with impingement of the right L3 nerve root in the foramen. Bilateral facet hypertrophy and mild spinal stenosis also noted. Left foramen patent. L4-5: 5 mm anterior slip. Disc degeneration and moderate facet degeneration. Mild spinal stenosis. Right foraminal encroachment with compression of the right L4 nerve root. Left foramen patent L5-S1: Disc degeneration left greater than right with disc bulging and spurring. Mild facet hypertrophy bilaterally. Mild left foraminal narrowing. IMPRESSION: L3-4 right-sided disc and protrusion with extruded disc fragment extending caudally behind the L3 vertebral body on the right. Disc protrusion extends into the right foramen with impingement of the right L3 nerve root. Grade 1 slip L4-5 . Right foraminal encroachment with compression of the right L4 nerve root. Electronically Signed   By: Franchot Gallo M.D.   On: 09/14/2014 08:29       Assessment & Plan:   Problem List Items Addressed This Visit    GERD (gastroesophageal reflux disease)    On protonix.  Symptoms controlled.  Discussed caution with antiinflammatories.        Left shoulder pain - Primary    Previous injury.   Now with increased pain left  shoulder.  Check xray.  On lodine.  Consider referral to ortho.        Relevant Orders   DG Shoulder Left (Completed)   Comprehensive metabolic panel (Completed)   Sedimentation rate (Completed)   Rheumatoid factor (Completed)   ANA (Completed)   C-reactive protein (Completed)   Leg pain, left    Has seen ortho.  Has seen Dr Sharlet Salina.  Still having some issues as outlined.  Plans f/u with ortho.        Low iron   Relevant Orders   CBC with Differential/Platelet (Completed)   Ferritin (Completed)   Psoriatic arthritis ([place])    Being followed by Dr Nehemiah Massed.  On Otezla.        Stress    Increased stress.  Discussed with her today.  Feels handling things relatively well.  Follow.         Other Visit Diagnoses    Arthralgia, unspecified joint       Relevant Orders   Sedimentation rate (Completed)   Rheumatoid factor (Completed)   ANA (Completed)   C-reactive protein (Completed)       Einar Pheasant, MD

## 2016-06-04 LAB — CBC WITH DIFFERENTIAL/PLATELET
Basophils Absolute: 0 10*3/uL (ref 0.0–0.1)
Basophils Relative: 0.9 % (ref 0.0–3.0)
EOS PCT: 1.3 % (ref 0.0–5.0)
Eosinophils Absolute: 0.1 10*3/uL (ref 0.0–0.7)
HEMATOCRIT: 35.8 % — AB (ref 36.0–46.0)
Hemoglobin: 11.8 g/dL — ABNORMAL LOW (ref 12.0–15.0)
LYMPHS ABS: 1.9 10*3/uL (ref 0.7–4.0)
Lymphocytes Relative: 42.8 % (ref 12.0–46.0)
MCHC: 32.9 g/dL (ref 30.0–36.0)
MCV: 82.7 fl (ref 78.0–100.0)
MONOS PCT: 10 % (ref 3.0–12.0)
Monocytes Absolute: 0.5 10*3/uL (ref 0.1–1.0)
NEUTROS PCT: 45 % (ref 43.0–77.0)
Neutro Abs: 2 10*3/uL (ref 1.4–7.7)
Platelets: 389 10*3/uL (ref 150.0–400.0)
RBC: 4.33 Mil/uL (ref 3.87–5.11)
RDW: 14.8 % (ref 11.5–15.5)
WBC: 4.5 10*3/uL (ref 4.0–10.5)

## 2016-06-04 LAB — COMPREHENSIVE METABOLIC PANEL WITH GFR
ALT: 12 U/L (ref 0–35)
AST: 18 U/L (ref 0–37)
Albumin: 4.2 g/dL (ref 3.5–5.2)
Alkaline Phosphatase: 87 U/L (ref 39–117)
BUN: 11 mg/dL (ref 6–23)
CO2: 28 meq/L (ref 19–32)
Calcium: 9.3 mg/dL (ref 8.4–10.5)
Chloride: 105 meq/L (ref 96–112)
Creatinine, Ser: 0.87 mg/dL (ref 0.40–1.20)
GFR: 70.12 mL/min (ref >60.00)
Glucose, Bld: 97 mg/dL (ref 70–99)
Potassium: 4.6 meq/L (ref 3.5–5.1)
Sodium: 141 meq/L (ref 135–145)
Total Bilirubin: 0.3 mg/dL (ref 0.2–1.2)
Total Protein: 6.8 g/dL (ref 6.0–8.3)

## 2016-06-04 LAB — ANA: ANA: NEGATIVE

## 2016-06-04 LAB — C-REACTIVE PROTEIN: CRP: 0.2 mg/dL — ABNORMAL LOW (ref 0.5–20.0)

## 2016-06-04 LAB — RHEUMATOID FACTOR

## 2016-06-04 LAB — SEDIMENTATION RATE: Sed Rate: 3 mm/h (ref 0–30)

## 2016-06-04 LAB — FERRITIN: Ferritin: 7.5 ng/mL — ABNORMAL LOW (ref 10.0–291.0)

## 2016-06-05 ENCOUNTER — Telehealth: Payer: Self-pay

## 2016-06-05 DIAGNOSIS — M25512 Pain in left shoulder: Secondary | ICD-10-CM

## 2016-06-05 NOTE — Telephone Encounter (Signed)
-----   Message from Einar Pheasant, MD sent at 06/04/2016  5:03 AM EST ----- Notify pt that her xray of her shoulder reveals arthritis changes.  Given increased pain and limitation with movement, I would like to refer her to ortho for further evaluation and treatment.  If agreeable, let me know and I will place the order.

## 2016-06-05 NOTE — Telephone Encounter (Signed)
Pt would like to be sent to [facility]	[place] at [facility]

## 2016-06-05 NOTE — Telephone Encounter (Signed)
lmrtc

## 2016-06-06 ENCOUNTER — Telehealth: Payer: Self-pay

## 2016-06-06 NOTE — Telephone Encounter (Signed)
lmtrc

## 2016-06-06 NOTE — Telephone Encounter (Signed)
-----   Message from Einar Pheasant, MD sent at 06/06/2016  5:39 AM EST ----- Please call and notify pt that her hgb has decreased and her iron stores are low.  Is she taking any iron now.  If not, needs to start ferrous sulfate 325mg  q day (is otc).  She has already seen GI and is planning for EGD and colonoscopy.  Inflammatory markers are ok (negative).  Kidney function tests and liver function tests are wnl.  Will recheck hgb and iron at next f/u appt.  (please make note on appt - needs labs).  Thanks

## 2016-06-06 NOTE — Telephone Encounter (Signed)
Order placed for referral to ortho 

## 2016-06-07 ENCOUNTER — Encounter: Payer: Self-pay | Admitting: *Deleted

## 2016-06-07 NOTE — Telephone Encounter (Signed)
lmtrc

## 2016-06-07 NOTE — Telephone Encounter (Signed)
Pt called back returning your call. Thank you!  Call pt @ [phone]

## 2016-06-07 NOTE — Telephone Encounter (Signed)
Spoke to pt she I staking the otc iron most every day now pls advise if she needs to do anything further.

## 2016-06-07 NOTE — Telephone Encounter (Signed)
Have her continue the iron she is doing and make sure takes daily.  I would like to recheck her cbc and ferritin in 2 months.

## 2016-06-10 ENCOUNTER — Ambulatory Visit
Admission: RE | Admit: 2016-06-10 | Discharge: 2016-06-10 | Disposition: A | Discharge status: Home / Self Care | Payer: BC Managed Care – PPO | Source: Ambulatory Visit | Attending: Unknown Physician Specialty | Admitting: Unknown Physician Specialty

## 2016-06-10 ENCOUNTER — Ambulatory Visit: Payer: BC Managed Care – PPO | Admitting: Anesthesiology

## 2016-06-10 ENCOUNTER — Encounter
Admission: RE | Disposition: A | Discharge status: Home / Self Care | Payer: Self-pay | Source: Ambulatory Visit | Attending: Unknown Physician Specialty

## 2016-06-10 ENCOUNTER — Encounter: Payer: Self-pay | Admitting: Internal Medicine

## 2016-06-10 DIAGNOSIS — K648 Other hemorrhoids: Secondary | ICD-10-CM | POA: Diagnosis not present

## 2016-06-10 DIAGNOSIS — Z1211 Encounter for screening for malignant neoplasm of colon: Secondary | ICD-10-CM | POA: Diagnosis not present

## 2016-06-10 DIAGNOSIS — K2950 Unspecified chronic gastritis without bleeding: Secondary | ICD-10-CM | POA: Diagnosis not present

## 2016-06-10 DIAGNOSIS — K219 Gastro-esophageal reflux disease without esophagitis: Secondary | ICD-10-CM | POA: Diagnosis not present

## 2016-06-10 DIAGNOSIS — K222 Esophageal obstruction: Secondary | ICD-10-CM | POA: Insufficient documentation

## 2016-06-10 DIAGNOSIS — K5730 Diverticulosis of large intestine without perforation or abscess without bleeding: Secondary | ICD-10-CM | POA: Diagnosis not present

## 2016-06-10 DIAGNOSIS — Z86010 Personal history of colonic polyps: Secondary | ICD-10-CM | POA: Diagnosis not present

## 2016-06-10 HISTORY — DX: Unspecified thoracic, thoracolumbar and lumbosacral intervertebral disc disorder: M51.9

## 2016-06-10 HISTORY — DX: Dysphagia, unspecified: R13.10

## 2016-06-10 HISTORY — DX: Unspecified osteoarthritis, unspecified site: M19.90

## 2016-06-10 HISTORY — PX: COLONOSCOPY WITH PROPOFOL: SHX5780

## 2016-06-10 HISTORY — PX: ESOPHAGOGASTRODUODENOSCOPY (EGD) WITH PROPOFOL: SHX5813

## 2016-06-10 SURGERY — COLONOSCOPY WITH PROPOFOL
Anesthesia: General

## 2016-06-10 SURGERY — EGD (ESOPHAGOGASTRODUODENOSCOPY)
Anesthesia: General

## 2016-06-10 MED ORDER — PROPOFOL 10 MG/ML IV BOLUS
INTRAVENOUS | Status: AC
  Filled 2016-06-10: qty 20

## 2016-06-10 MED ORDER — LIDOCAINE HCL (CARDIAC) 20 MG/ML IV SOLN
INTRAVENOUS | Status: DC | PRN
  Administered 2016-06-10: 2 mL via INTRAVENOUS

## 2016-06-10 MED ORDER — LIDOCAINE HCL (PF) 2 % IJ SOLN
INTRAMUSCULAR | Status: AC
  Filled 2016-06-10: qty 2

## 2016-06-10 MED ORDER — PROPOFOL 500 MG/50ML IV EMUL
INTRAVENOUS | Status: DC | PRN
  Administered 2016-06-10: 140 ug/kg/min via INTRAVENOUS

## 2016-06-10 MED ORDER — SODIUM CHLORIDE 0.9 % IV SOLN
INTRAVENOUS | Status: DC
  Administered 2016-06-10: 11:00:00 via INTRAVENOUS

## 2016-06-10 MED ORDER — SODIUM CHLORIDE 0.9 % IV SOLN: INTRAVENOUS | Status: DC

## 2016-06-10 MED ORDER — PROPOFOL 10 MG/ML IV BOLUS
INTRAVENOUS | Status: DC | PRN
  Administered 2016-06-10 (×2): 20 mg via INTRAVENOUS
  Administered 2016-06-10: 40 mg via INTRAVENOUS

## 2016-06-10 NOTE — Anesthesia Preprocedure Evaluation (Signed)
Anesthesia Evaluation  Patient identified by MRN, date of birth, ID band Patient awake    Reviewed: Allergy & Precautions, NPO status , Patient's Chart, lab work & pertinent test results  Airway Mallampati: I       Dental  (+) Teeth Intact   Pulmonary neg pulmonary ROS,    breath sounds clear to auscultation       Cardiovascular negative cardio ROS   Rhythm:Regular     Neuro/Psych negative neurological ROS     GI/Hepatic Neg liver ROS, GERD  Medicated,  Endo/Other  negative endocrine ROS  Renal/GU negative Renal ROS     Musculoskeletal   Abdominal Normal abdominal exam  (+)   Peds negative pediatric ROS (+)  Hematology negative hematology ROS (+)   Anesthesia Other Findings   Reproductive/Obstetrics                             Anesthesia Physical Anesthesia Plan  ASA: I  Anesthesia Plan: General   Post-op Pain Management:    Induction: Intravenous  Airway Management Planned: Natural Airway and Nasal Cannula  Additional Equipment:   Intra-op Plan:   Post-operative Plan:   Informed Consent: I have reviewed the patients History and Physical, chart, labs and discussed the procedure including the risks, benefits and alternatives for the proposed anesthesia with the patient or authorized representative who has indicated his/her understanding and acceptance.     Plan Discussed with: Surgeon  Anesthesia Plan Comments:         Anesthesia Quick Evaluation

## 2016-06-10 NOTE — H&P (Signed)
Primary Care Physician:  Einar Pheasant, MD Primary Gastroenterologist:  Dr. Vira Agar  Pre-Procedure History & Physical: HPI:  April Santos is a 61 y.o. female is here for an endoscopy and colonoscopy.   Past Medical History:  Diagnosis Date  . Arthritis   . Colon polyps   . Disc disorder   . Dysphagia   . GERD (gastroesophageal reflux disease)   . History of uterine fibroid     Past Surgical History:  Procedure Laterality Date  . ABDOMINAL HYSTERECTOMY    . BREAST SURGERY  1999   biopsy  . carpal  2005   Tunnel right hand  . COLONOSCOPY WITH ESOPHAGOGASTRODUODENOSCOPY (EGD)    . FOOT SURGERY  02/07/1003   right foot benign tumor  . SACROILIAC JOINT INJECTION  08/17/13 & 04/13/14   Dr. Sharlet Salina  . SIGMOIDOSCOPY    . [place]    Prior to Admission medications   Medication Sig Start Date End Date Taking? Authorizing Provider  ALPRAZolam (XANAX) 0.25 MG tablet Take 1 tablet (0.25 mg total) by mouth at bedtime as needed. 01/03/16  Yes Einar Pheasant, MD  Apremilast (OTEZLA) 30 MG TABS Take 1 tablet by mouth daily.    Historical Provider, MD  BIOTIN PO Take by mouth.    Historical Provider, MD  Calcium Citrate (CITRACAL PO) Take by mouth 4 (four) times daily.    Historical Provider, MD  cyclobenzaprine (FLEXERIL) 5 MG tablet TAKE ONE TABLET BY MOUTH AT BEDTIME AS NEEDED 12/20/14   Einar Pheasant, MD  estradiol (ESTRACE) 0.1 MG/GM vaginal cream Place 2 g vaginally 2 (two) times a week.    Historical Provider, MD  estradiol (ESTRACE) 1 MG tablet Take 1 tablet (1 mg total) by mouth daily. 11/24/14   Einar Pheasant, MD  etodolac (LODINE) 500 MG tablet  09/07/15   Historical Provider, MD  fluticasone (CUTIVATE) 0.05 % cream Apply 1 application topically as needed.    Historical Provider, MD  HYDROcodone-acetaminophen (NORCO) 5-325 MG per tablet 1/2 - 1 tablet q 8 hours prn 08/13/13   Raquel M Rey, NP  IRON PO Take by mouth.    Historical Provider, MD  meloxicam  (MOBIC) 15 MG tablet TAKE ONE TABLET EVERY DAY FOR PAIN Patient not taking: Reported on 06/10/2016 12/20/14   Einar Pheasant, MD  Multiple Vitamins-Minerals (MULTIVITAL PO) Take by mouth daily.    Historical Provider, MD  pantoprazole (PROTONIX) 40 MG tablet TAKE ONE TABLET BY MOUTH EVERY DAY 08/28/15   Einar Pheasant, MD  triamcinolone cream (KENALOG) 0.1 % Apply 1 application topically 2 (two) times daily.    Historical Provider, MD    Allergies as of 03/20/2016 - Review Complete 03/11/2016  Allergen Reaction Noted  . Erythromycin ethylsuccinate [erythromycin]  04/28/2012  . Omnicef [cefdinir]  04/28/2012    Family History  Problem Relation Age of Onset  . Breast cancer Mother     double masectomy  . Arthritis Father   . GER disease Father   . Heart disease      paternal uncles  . Colon cancer Neg Hx     Social History   Social History  . Marital status: Divorced    Spouse name: N/A  . Number of children: 0  . Years of education: N/A   Occupational History  . Not on file.   Social History Main Topics  . Smoking status: Never Smoker  . Smokeless tobacco: Never Used  . Alcohol use 0.6 oz/week  1 Glasses of wine per week     Comment: bimonthly  . Drug use: No  . Sexual activity: Not on file   Other Topics Concern  . Not on file   Social History Narrative  . No narrative on file    Review of Systems: See HPI, otherwise negative ROS  Physical Exam: BP (!) 141/93   Pulse 80   Temp (!) 96.8 F (36 C) (Tympanic)   Resp (!) 22   Ht 5\' 2"  (1.575 m)   Wt 64 kg (141 lb)   LMP 02/26/2002   SpO2 100%   BMI 25.79 kg/m  General:   Alert,  pleasant and cooperative in NAD Head:  Normocephalic and atraumatic. Neck:  Supple; no masses or thyromegaly. Lungs:  Clear throughout to auscultation.    Heart:  Regular rate and rhythm. Abdomen:  Soft, nontender and nondistended. Normal bowel sounds, without guarding, and without rebound.   Neurologic:  Alert and  oriented  x4;  grossly normal neurologically.  Impression/Plan: April Santos is here for an endoscopy and colonoscopy to be performed for dysphagia with food for 3 years and screening for colon cancer  Risks, benefits, limitations, and alternatives regarding  endoscopy and colonoscopy have been reviewed with the patient.  Questions have been answered.  All parties agreeable.   Gaylyn Cheers, MD  06/10/2016, 12:05 PM

## 2016-06-10 NOTE — Op Note (Signed)
[facility] Gastroenterology Patient Name: Nyashia Hirschy Procedure Date: 06/10/2016 12:03 PM MRN: [id number] Account #: [id number] Date of Birth: September 21, 1955 Admit Type: Outpatient Age: 61 Room: [facility] ENDO ROOM 3 Gender: Female Note Status: Finalized Procedure:            Colonoscopy Indications:          Screening for colorectal malignant neoplasm Providers:            Manya Silvas, MD Medicines:            Propofol per Anesthesia Complications:        No immediate complications. Procedure:            Pre-Anesthesia Assessment:                       - After reviewing the risks and benefits, the patient                        was deemed in satisfactory condition to undergo the                        procedure.                       After obtaining informed consent, the colonoscope was                        passed under direct vision. Throughout the procedure,                        the patient's blood pressure, pulse, and oxygen                        saturations were monitored continuously. The                        Colonoscope was introduced through the anus and                        advanced to the the cecum, identified by appendiceal                        orifice and ileocecal valve. The colonoscopy was                        performed without difficulty. The patient tolerated the                        procedure well. The quality of the bowel preparation                        was excellent. Findings:      A single medium-mouthed diverticulum was found in the sigmoid colon.      Internal hemorrhoids were found during endoscopy. The hemorrhoids were       small and Grade I (internal hemorrhoids that do not prolapse).      The exam was otherwise without abnormality. Impression:           - Diverticulosis in the sigmoid colon.                       - Internal  hemorrhoids.                       - The examination was otherwise normal.   - No specimens collected. Recommendation:       - Repeat colonoscopy in 10 years for screening purposes. Manya Silvas, MD 06/10/2016 12:48:04 PM This report has been signed electronically. Number of Addenda: 0 Note Initiated On: 06/10/2016 12:03 PM Scope Withdrawal Time: 0 hours 6 minutes 16 seconds  Total Procedure Duration: 0 hours 21 minutes 5 seconds       [facility]

## 2016-06-10 NOTE — Transfer of Care (Signed)
Immediate Anesthesia Transfer of Care Note  Patient: April Santos  Procedure(s) Performed: Procedure(s): COLONOSCOPY WITH PROPOFOL (N/A) ESOPHAGOGASTRODUODENOSCOPY (EGD) WITH PROPOFOL (N/A)  Patient Location: PACU  Anesthesia Type:General  Level of Consciousness: awake  Airway & Oxygen Therapy: Patient Spontanous Breathing and Patient connected to nasal cannula oxygen  Post-op Assessment: Report given to RN and Post -op Vital signs reviewed and stable  Post vital signs: Reviewed  Last Vitals:  Vitals:   06/10/16 1036  BP: (!) 141/93  Pulse: 80  Resp: (!) 22  Temp: (!) 36 C    Last Pain:  Vitals:   06/10/16 1036  TempSrc: Tympanic         Complications: No apparent anesthesia complications

## 2016-06-10 NOTE — Anesthesia Post-op Follow-up Note (Signed)
Anesthesia QCDR form completed.        

## 2016-06-10 NOTE — Telephone Encounter (Signed)
lmtrc

## 2016-06-10 NOTE — Op Note (Signed)
[facility] Gastroenterology Patient Name: April Santos Procedure Date: 06/10/2016 12:03 PM MRN: [id number] Account #: [id number] Date of Birth: 1955/11/13 Admit Type: Outpatient Age: 61 Room: [facility] ENDO ROOM 3 Gender: Female Note Status: Finalized Procedure:            Upper GI endoscopy Indications:          Dysphagia Providers:            Manya Silvas, MD Referring MD:         Einar Pheasant, MD (Referring MD) Medicines:            Propofol per Anesthesia Complications:        No immediate complications. Procedure:            Pre-Anesthesia Assessment:                       - After reviewing the risks and benefits, the patient                        was deemed in satisfactory condition to undergo the                        procedure.                       After obtaining informed consent, the endoscope was                        passed under direct vision. Throughout the procedure,                        the patient's blood pressure, pulse, and oxygen                        saturations were monitored continuously. The Endoscope                        was introduced through the mouth, and advanced to the                        second part of duodenum. The upper GI endoscopy was                        accomplished without difficulty. The patient tolerated                        the procedure well. Findings:      A mild Schatzki ring (acquired) was found at the gastroesophageal       junction. A guidewire was placed and the scope was withdrawn. Dilation       was performed with a Savary dilator with mild resistance at 16 mm and 17       mm.      Localized mild inflammation characterized by erythema and granularity       was found in the gastric antrum. Biopsies were taken with a cold forceps       for histology. Biopsies were taken with a cold forceps for Helicobacter       pylori testing.      The examined duodenum was normal. Impression:           - Mild  Schatzki  ring. Dilated.                       - Gastritis. Biopsied.                       - Normal examined duodenum. Recommendation:       - Await pathology results.                       - Perform a colonoscopy today. Manya Silvas, MD 06/10/2016 12:22:10 PM This report has been signed electronically. Number of Addenda: 0 Note Initiated On: 06/10/2016 12:03 PM      [facility]

## 2016-06-11 ENCOUNTER — Encounter: Payer: Self-pay | Admitting: Unknown Physician Specialty

## 2016-06-11 LAB — SURGICAL PATHOLOGY

## 2016-06-12 NOTE — Telephone Encounter (Signed)
Number busy

## 2016-06-13 NOTE — Telephone Encounter (Signed)
April Santos, I had sent you a message earlier about this pt.  She messaged me this am and informed me that she has an appt scheduled for 06/26/16.  She is having increased pain and is going to call and see if can get an earlier appt.    Dr Nicki Reaper

## 2016-06-14 NOTE — Telephone Encounter (Signed)
See other my chart message.  Pt was called with 06/19/16 appt.

## 2016-06-15 ENCOUNTER — Encounter: Payer: Self-pay | Admitting: Internal Medicine

## 2016-06-15 NOTE — Assessment & Plan Note (Signed)
Being followed by Dr Nehemiah Massed.  On Otezla.

## 2016-06-15 NOTE — Assessment & Plan Note (Signed)
Increased stress.  Discussed with her today.  Feels handling things relatively well.  Follow.

## 2016-06-15 NOTE — Assessment & Plan Note (Signed)
Has seen ortho.  Has seen Dr Sharlet Salina.  Still having some issues as outlined.  Plans f/u with ortho.

## 2016-06-15 NOTE — Assessment & Plan Note (Signed)
Previous injury.   Now with increased pain left shoulder.  Check xray.  On lodine.  Consider referral to ortho.

## 2016-06-15 NOTE — Assessment & Plan Note (Signed)
On protonix.  Symptoms controlled.  Discussed caution with antiinflammatories.

## 2016-06-17 NOTE — Telephone Encounter (Signed)
Left message to return call to our office.  I have also mailed letter with information

## 2016-06-19 NOTE — Anesthesia Postprocedure Evaluation (Signed)
Anesthesia Post Note  Patient: April Santos  Procedure(s) Performed: Procedure(s) (LRB): COLONOSCOPY WITH PROPOFOL (N/A) ESOPHAGOGASTRODUODENOSCOPY (EGD) WITH PROPOFOL (N/A)  Patient location during evaluation: PACU Anesthesia Type: General Level of consciousness: awake Pain management: pain level controlled Vital Signs Assessment: post-procedure vital signs reviewed and stable Respiratory status: spontaneous breathing Cardiovascular status: stable Anesthetic complications: no     Last Vitals:  Vitals:   06/10/16 1258 06/10/16 1318  BP: 117/80 126/87  Pulse: 76 64  Resp: 14 12  Temp:      Last Pain:  Vitals:   06/10/16 1248  TempSrc: Tympanic                 VAN STAVEREN,[name]

## 2016-06-24 NOTE — Telephone Encounter (Signed)
Left message to call.

## 2016-07-01 ENCOUNTER — Other Ambulatory Visit: Payer: Self-pay | Admitting: Internal Medicine

## 2016-08-05 ENCOUNTER — Telehealth: Payer: Self-pay | Admitting: *Deleted

## 2016-08-05 NOTE — Telephone Encounter (Signed)
Patient will need a rescheduled appt from 05/03. She requested the lasted appt possible -thanks

## 2016-08-06 NOTE — Telephone Encounter (Signed)
Lm on vm to reschedule appt

## 2016-08-08 ENCOUNTER — Ambulatory Visit: Payer: BC Managed Care – PPO | Admitting: Internal Medicine

## 2016-08-21 ENCOUNTER — Encounter: Payer: Self-pay | Admitting: Family Medicine

## 2016-08-21 ENCOUNTER — Encounter: Payer: Self-pay | Admitting: Internal Medicine

## 2016-08-21 ENCOUNTER — Ambulatory Visit (INDEPENDENT_AMBULATORY_CARE_PROVIDER_SITE_OTHER): Payer: BC Managed Care – PPO | Admitting: Family Medicine

## 2016-08-21 DIAGNOSIS — J069 Acute upper respiratory infection, unspecified: Secondary | ICD-10-CM

## 2016-08-21 MED ORDER — FLUTICASONE PROPIONATE 50 MCG/ACT NA SUSP: 2.0000 | Freq: Every day | NASAL | 6 refills | Status: DC

## 2016-08-21 MED ORDER — HYDROCODONE-HOMATROPINE 5-1.5 MG/5ML PO SYRP: 5.0000 mL | ORAL_SOLUTION | Freq: Three times a day (TID) | ORAL | 0 refills | Status: DC | PRN

## 2016-08-21 NOTE — Assessment & Plan Note (Signed)
Symptoms most consistent with viral upper respiratory illness. No focal findings to indicate bacterial illness. Biggest issue is cough for the patient and we will treat with Hycodan. Advised this could make her drowsy. She can add Flonase. She'll continue Zyrtec. If not improving by early next week she'll follow-up. Given return precautions.

## 2016-08-21 NOTE — Patient Instructions (Signed)
Nice to see you. You likely have a viral illness. We'll treat your cough with Hycodan. You can start on Flonase as well. Please continue the Zyrtec. If you develop cough productive of blood, fevers, shortness of breath, or any new or changing symptoms please seek medical attention immediately.

## 2016-08-21 NOTE — Progress Notes (Signed)
  Tommi Rumps, MD Phone: [phone]  April Santos is a 61 y.o. female who presents today for same-day visit.  Patient notes about 5 days of symptoms. Started with sore throat and postnasal drip and then developed cough. Cough is nonproductive. No sinus or nasal congestion. Sneezing a lot. No ear pain. Felt some chills and maybe a slight fever over the weekend. No shortness of breath. Tried over-the-counter medications with Robitussin and TheraFlu which were not beneficial. Possible sick contacts.  ROS see history of present illness  Objective  Physical Exam Vitals:   08/21/16 1043  BP: 118/82  Pulse: 72  Temp: 98.4 F (36.9 C)    BP Readings from Last 3 Encounters:  08/21/16 118/82  06/10/16 126/87  06/03/16 110/62   Wt Readings from Last 3 Encounters:  08/21/16 141 lb 12.8 oz (64.3 kg)  06/10/16 141 lb (64 kg)  06/03/16 141 lb 12.8 oz (64.3 kg)    Physical Exam  Constitutional: No distress.  HENT:  Head: Normocephalic and atraumatic.  Mouth/Throat: Oropharynx is clear and moist. No oropharyngeal exudate.  Normal TMs bilaterally, no sinus tenderness to percussion  Eyes: Conjunctivae are normal. Pupils are equal, round, and reactive to light.  Neck: Neck supple.  Cardiovascular: Normal rate, regular rhythm and normal heart sounds.   Pulmonary/Chest: Effort normal and breath sounds normal.  Lymphadenopathy:    She has no cervical adenopathy.  Neurological: She is alert. Gait normal.  Skin: Skin is warm and dry. She is not diaphoretic.     Assessment/Plan: Please see individual problem list.  Viral upper respiratory infection Symptoms most consistent with viral upper respiratory illness. No focal findings to indicate bacterial illness. Biggest issue is cough for the patient and we will treat with Hycodan. Advised this could make her drowsy. She can add Flonase. She'll continue Zyrtec. If not improving by early next week she'll follow-up. Given return  precautions.   No orders of the defined types were placed in this encounter.   Meds ordered this encounter  Medications  . HYDROcodone-homatropine (HYCODAN) 5-1.5 MG/5ML syrup    Sig: Take 5 mLs by mouth every 8 (eight) hours as needed for cough.    Dispense:  120 mL    Refill:  0  . fluticasone (FLONASE) 50 MCG/ACT nasal spray    Sig: Place 2 sprays into both nostrils daily.    Dispense:  16 g    Refill:  [place], [place]

## 2016-09-24 LAB — HM MAMMOGRAPHY

## 2016-10-02 ENCOUNTER — Encounter: Payer: Self-pay | Admitting: Internal Medicine

## 2016-10-16 ENCOUNTER — Ambulatory Visit: Payer: BC Managed Care – PPO | Admitting: Internal Medicine

## 2016-11-19 ENCOUNTER — Other Ambulatory Visit: Payer: Self-pay | Admitting: Internal Medicine

## 2016-11-19 ENCOUNTER — Encounter: Payer: Self-pay | Admitting: Internal Medicine

## 2016-11-19 MED ORDER — ALPRAZOLAM 0.25 MG PO TABS: 0.2500 mg | ORAL_TABLET | Freq: Every evening | ORAL | 0 refills | Status: DC | PRN

## 2016-11-19 NOTE — Telephone Encounter (Signed)
ok'd rx for alprazolam. Keep scheduled appt.

## 2016-11-21 NOTE — Telephone Encounter (Signed)
Should be done.  Need to see if rx sent in.

## 2016-11-22 NOTE — Telephone Encounter (Signed)
Was faxed 8/14

## 2016-12-19 ENCOUNTER — Encounter: Payer: Self-pay | Admitting: Internal Medicine

## 2016-12-19 ENCOUNTER — Ambulatory Visit (INDEPENDENT_AMBULATORY_CARE_PROVIDER_SITE_OTHER): Payer: BC Managed Care – PPO | Admitting: Internal Medicine

## 2016-12-19 DIAGNOSIS — F439 Reaction to severe stress, unspecified: Secondary | ICD-10-CM | POA: Diagnosis not present

## 2016-12-19 DIAGNOSIS — L4050 Arthropathic psoriasis, unspecified: Secondary | ICD-10-CM

## 2016-12-19 DIAGNOSIS — E611 Iron deficiency: Secondary | ICD-10-CM | POA: Diagnosis not present

## 2016-12-19 MED ORDER — ZOSTER VAC RECOMB ADJUVANTED 50 MCG/0.5ML IM SUSR: 0.5000 mL | Freq: Once | INTRAMUSCULAR | 0 refills | Status: AC

## 2016-12-19 NOTE — Progress Notes (Signed)
Patient ID: April Santos, female   DOB: [birth year], 61 y.o.   MRN: [id number]   Subjective:    Patient ID: April Santos, female    DOB: [birth year], 61 y.o.   MRN: [id number]  HPI  Patient here for a scheduled follow up.  Increased stress with her mother's health issues.  Discussed with her today.  Overall she feels she is handling things relatively well.  Does not feel needs any further intervention at this time.  Takes xanax prn.  Tries to stay active.  No chest pain.  No sob.  No acid reflux.  No abdominal pain or cramping.  Bowels stable.  Taking otezla.  Upsets her stomach.     Past Medical History:  Diagnosis Date  . Arthritis   . Colon polyps   . Disc disorder   . Dysphagia   . GERD (gastroesophageal reflux disease)   . History of uterine fibroid    Past Surgical History:  Procedure Laterality Date  . ABDOMINAL HYSTERECTOMY    . BREAST SURGERY  1999   biopsy  . carpal  2005   Tunnel right hand  . COLONOSCOPY WITH ESOPHAGOGASTRODUODENOSCOPY (EGD)    . COLONOSCOPY WITH PROPOFOL N/A 06/10/2016   Procedure: COLONOSCOPY WITH PROPOFOL;  Surgeon: Manya Silvas, MD;  Location[facility] ENDOSCOPY;  Service: Endoscopy;  Laterality: N/A;  . ESOPHAGOGASTRODUODENOSCOPY (EGD) WITH PROPOFOL N/A 06/10/2016   Procedure: ESOPHAGOGASTRODUODENOSCOPY (EGD) WITH PROPOFOL;  Surgeon: Manya Silvas, MD;  Location: [facility] ENDOSCOPY;  Service: Endoscopy;  Laterality: N/A;  . FOOT SURGERY  02/07/1003   right foot benign tumor  . SACROILIAC JOINT INJECTION  08/17/13 & 04/13/14   Dr. Sharlet Salina  . SIGMOIDOSCOPY    . UTERINE FIBROID SURGERY  1997   Family History  Problem Relation Age of Onset  . Breast cancer Mother        double masectomy  . Arthritis Father   . GER disease Father   . Heart disease Unknown        paternal uncles  . Colon cancer Neg Hx    Social History   Social History  . Marital status: Divorced    Spouse name: N/A  . Number of children: 0  . Years of education: N/A    Social History Main Topics  . Smoking status: Never Smoker  . Smokeless tobacco: Never Used  . Alcohol use 0.6 oz/week    1 Glasses of wine per week     Comment: bimonthly  . Drug use: No  . Sexual activity: Not Asked   Other Topics Concern  . None   Social History Narrative  . None    Outpatient Encounter Prescriptions as of 12/19/2016  Medication Sig  . ALPRAZolam (XANAX) 0.25 MG tablet Take 1 tablet (0.25 mg total) by mouth at bedtime as needed.  Marland Kitchen Apremilast (OTEZLA) 30 MG TABS Take 1 tablet by mouth daily.  Marland Kitchen BIOTIN PO Take by mouth.  . Calcium Citrate (CITRACAL PO) Take by mouth 4 (four) times daily.  . cyclobenzaprine (FLEXERIL) 5 MG tablet TAKE ONE TABLET BY MOUTH AT BEDTIME AS NEEDED  . estradiol (ESTRACE) 0.1 MG/GM vaginal cream Place 2 g vaginally 2 (two) times a week.  . estradiol (ESTRACE) 1 MG tablet Take 1 tablet (1 mg total) by mouth daily.  Marland Kitchen etodolac (LODINE) 500 MG tablet   . fluticasone (CUTIVATE) 0.05 % cream Apply 1 application topically as needed.  . fluticasone (FLONASE) 50 MCG/ACT nasal spray Place 2 sprays into  both nostrils daily.  Marland Kitchen HYDROcodone-acetaminophen (NORCO) 5-325 MG per tablet 1/2 - 1 tablet q 8 hours prn  . HYDROcodone-homatropine (HYCODAN) 5-1.5 MG/5ML syrup Take 5 mLs by mouth every 8 (eight) hours as needed for cough.  . IRON PO Take by mouth.  . Multiple Vitamins-Minerals (MULTIVITAL PO) Take by mouth daily.  . pantoprazole (PROTONIX) 40 MG tablet TAKE ONE TABLET BY MOUTH EVERY DAY  . triamcinolone cream (KENALOG) 0.1 % Apply 1 application topically 2 (two) times daily.  . [EXPIRED] Zoster Vac Recomb Adjuvanted (SHINGRIX) injection Inject 0.5 mLs into the muscle once.   No facility-administered encounter medications on file as of 12/19/2016.     Review of Systems  Constitutional: Negative for appetite change and unexpected weight change.  HENT: Negative for congestion and sinus pressure.   Respiratory: Negative for cough, chest  tightness and shortness of breath.   Cardiovascular: Negative for chest pain, palpitations and leg swelling.  Gastrointestinal: Negative for abdominal pain, diarrhea, nausea and vomiting.  Genitourinary: Negative for difficulty urinating and dysuria.  Musculoskeletal: Negative for joint swelling and myalgias.  Skin: Negative for color change and rash.  Neurological: Negative for dizziness and headaches.  Psychiatric/Behavioral: Negative for agitation and dysphoric mood.       Increased stress as outlined.         Objective:    Physical Exam  Constitutional: She appears well-developed and well-nourished. No distress.  HENT:  Nose: Nose normal.  Mouth/Throat: Oropharynx is clear and moist.  Neck: Neck supple. No thyromegaly present.  Cardiovascular: Normal rate and regular rhythm.   Pulmonary/Chest: Breath sounds normal. No respiratory distress. She has no wheezes.  Abdominal: Soft. Bowel sounds are normal. There is no tenderness.  Musculoskeletal: She exhibits no edema or tenderness.  Lymphadenopathy:    She has no cervical adenopathy.  Skin: No rash noted. No erythema.  Psychiatric: She has a normal mood and affect. Her behavior is normal.    BP 130/80 (BP Location: Left Arm, Patient Position: Sitting, Cuff Size: Normal)   Pulse 69   Temp 98.5 F (36.9 C) (Oral)   Resp 12   Ht 5\' 2"  (1.575 m)   Wt 139 lb 6.4 oz (63.2 kg)   LMP 02/26/2002   SpO2 96%   BMI 25.50 kg/m  Wt Readings from Last 3 Encounters:  12/19/16 139 lb 6.4 oz (63.2 kg)  08/21/16 141 lb 12.8 oz (64.3 kg)  06/10/16 141 lb (64 kg)     Lab Results  Component Value Date   WBC 4.5 06/03/2016   HGB 11.8 (L) 06/03/2016   HCT 35.8 (L) 06/03/2016   PLT 389.0 06/03/2016   GLUCOSE 97 06/03/2016   CHOL 181 01/04/2015   TRIG 96.0 01/04/2015   HDL 62.70 01/04/2015   LDLCALC 99 01/04/2015   ALT 12 06/03/2016   AST 18 06/03/2016   NA 141 06/03/2016   K 4.6 06/03/2016   CL 105 06/03/2016   CREATININE 0.87  06/03/2016   BUN 11 06/03/2016   CO2 28 06/03/2016   TSH 2.27 05/16/2014       Assessment & Plan:   Problem List Items Addressed This Visit    Low iron    Had EGD and colonoscopy 06/2016.  Gives blood.  Follow cbc and iron.        Psoriatic arthritis ([place])    On otezla.  Followed by Dr Nehemiah Massed.        Stress    Increased stress as outlined.  Discussed with  her today.  Does not feel needs anything more at this time.  Takes xanax prn.  Follow.           I spent 25 minutes with the patient and more than 50% of the time was spent in consultation regarding the above.  Time spent discussing her current issues and stress.  Discussed evaluation and further treatment options.     Einar Pheasant, MD

## 2016-12-21 ENCOUNTER — Encounter: Payer: Self-pay | Admitting: Internal Medicine

## 2016-12-21 NOTE — Assessment & Plan Note (Signed)
Increased stress as outlined.  Discussed with her today.  Does not feel needs anything more at this time.  Takes xanax prn.  Follow.

## 2016-12-21 NOTE — Assessment & Plan Note (Signed)
Had EGD and colonoscopy 06/2016.  Gives blood.  Follow cbc and iron.

## 2016-12-21 NOTE — Assessment & Plan Note (Signed)
On otezla.  Followed by Dr Kowalski.  

## 2017-01-06 ENCOUNTER — Ambulatory Visit (INDEPENDENT_AMBULATORY_CARE_PROVIDER_SITE_OTHER): Payer: BC Managed Care – PPO

## 2017-01-06 VITALS — Resp 20

## 2017-01-06 DIAGNOSIS — Z23 Encounter for immunization: Secondary | ICD-10-CM | POA: Diagnosis not present

## 2017-01-06 NOTE — Progress Notes (Addendum)
Reviewed.    Dr Scott 

## 2017-03-24 ENCOUNTER — Encounter: Payer: Self-pay | Admitting: Internal Medicine

## 2017-03-28 ENCOUNTER — Ambulatory Visit (INDEPENDENT_AMBULATORY_CARE_PROVIDER_SITE_OTHER): Payer: BC Managed Care – PPO

## 2017-03-28 ENCOUNTER — Ambulatory Visit: Payer: BC Managed Care – PPO | Admitting: Internal Medicine

## 2017-03-28 ENCOUNTER — Encounter: Payer: Self-pay | Admitting: Internal Medicine

## 2017-03-28 VITALS — BP 110/78 | HR 72 | Temp 97.9°F | Wt 140.0 lb

## 2017-03-28 DIAGNOSIS — R05 Cough: Secondary | ICD-10-CM | POA: Diagnosis not present

## 2017-03-28 DIAGNOSIS — L4050 Arthropathic psoriasis, unspecified: Secondary | ICD-10-CM

## 2017-03-28 DIAGNOSIS — K219 Gastro-esophageal reflux disease without esophagitis: Secondary | ICD-10-CM

## 2017-03-28 DIAGNOSIS — F439 Reaction to severe stress, unspecified: Secondary | ICD-10-CM

## 2017-03-28 DIAGNOSIS — R059 Cough, unspecified: Secondary | ICD-10-CM

## 2017-03-28 DIAGNOSIS — J069 Acute upper respiratory infection, unspecified: Secondary | ICD-10-CM

## 2017-03-28 MED ORDER — HYDROCODONE-HOMATROPINE 5-1.5 MG/5ML PO SYRP: 5.0000 mL | ORAL_SOLUTION | Freq: Three times a day (TID) | ORAL | 0 refills | Status: DC | PRN

## 2017-03-28 MED ORDER — PREDNISONE 10 MG PO TABS: ORAL_TABLET | ORAL | 0 refills | Status: DC

## 2017-03-28 MED ORDER — PANTOPRAZOLE SODIUM 40 MG PO TBEC: 40.0000 mg | DELAYED_RELEASE_TABLET | Freq: Every day | ORAL | 1 refills | Status: DC

## 2017-03-28 NOTE — Progress Notes (Signed)
Patient ID: April Santos, female   DOB: 1956-01-02, 61 y.o.   MRN: [id number]   Subjective:    Patient ID: April Santos, female    DOB: October 22, 1955, 61 y.o.   MRN: [id number]  HPI  Patient here for a scheduled follow up.  She reports some increased stress with her mother's health issues.  Overall she feels she is handling things relatively well.  Also reports increased cough.  Persistent.  Started two weeks ago.  Initially productive of green mucus.  Now persistent cough with coughing fits.  No significant production now.  No fever.  No sinus pressure.  Some decreased appetite.  Has been taking 3 ibuprofen bid.  Also taking theraflu.  Increased acid reflux.  Feels like "stuff " in her throat.  Taking protonix.  No bowel change.     Past Medical History:  Diagnosis Date  . Arthritis   . Colon polyps   . Disc disorder   . Dysphagia   . GERD (gastroesophageal reflux disease)   . History of uterine fibroid    Past Surgical History:  Procedure Laterality Date  . ABDOMINAL HYSTERECTOMY    . BREAST SURGERY  1999   biopsy  . carpal  2005   Tunnel right hand  . COLONOSCOPY WITH ESOPHAGOGASTRODUODENOSCOPY (EGD)    . COLONOSCOPY WITH PROPOFOL N/A 06/10/2016   Procedure: COLONOSCOPY WITH PROPOFOL;  Surgeon: Manya Silvas, MD;  Location: [facility] ENDOSCOPY;  Service: Endoscopy;  Laterality: N/A;  . ESOPHAGOGASTRODUODENOSCOPY (EGD) WITH PROPOFOL N/A 06/10/2016   Procedure: ESOPHAGOGASTRODUODENOSCOPY (EGD) WITH PROPOFOL;  Surgeon: Manya Silvas, MD;  Location: [facility] ENDOSCOPY;  Service: Endoscopy;  Laterality: N/A;  . FOOT SURGERY  02/07/1003   right foot benign tumor  . SACROILIAC JOINT INJECTION  08/17/13 & 04/13/14   Dr. Sharlet Salina  . SIGMOIDOSCOPY    . UTERINE FIBROID SURGERY  1997   Family History  Problem Relation Age of Onset  . Breast cancer Mother        double masectomy  . Arthritis Father   . GER disease Father   . Heart disease Unknown        paternal uncles  . Colon cancer Neg  Hx    Social History   Socioeconomic History  . Marital status: Divorced    Spouse name: None  . Number of children: 0  . Years of education: None  . Highest education level: None  Social Needs  . Financial resource strain: None  . Food insecurity - worry: None  . Food insecurity - inability: None  . Transportation needs - medical: None  . Transportation needs - non-medical: None  Occupational History  . None  Tobacco Use  . Smoking status: Never Smoker  . Smokeless tobacco: Never Used  Substance and Sexual Activity  . Alcohol use: Yes    Alcohol/week: 0.6 oz    Types: 1 Glasses of wine per week    Comment: bimonthly  . Drug use: No  . Sexual activity: None  Other Topics Concern  . None  Social History Narrative  . None    Outpatient Encounter Medications as of 03/28/2017  Medication Sig  . ALPRAZolam (XANAX) 0.25 MG tablet Take 1 tablet (0.25 mg total) by mouth at bedtime as needed.  Marland Kitchen Apremilast (OTEZLA) 30 MG TABS Take 1 tablet by mouth daily.  Marland Kitchen BIOTIN PO Take by mouth.  . Calcium Citrate (CITRACAL PO) Take by mouth 4 (four) times daily.  . cyclobenzaprine (FLEXERIL) 5 MG tablet  TAKE ONE TABLET BY MOUTH AT BEDTIME AS NEEDED  . estradiol (ESTRACE) 0.1 MG/GM vaginal cream Place 2 g vaginally 2 (two) times a week.  . estradiol (ESTRACE) 1 MG tablet Take 1 tablet (1 mg total) by mouth daily.  Marland Kitchen etodolac (LODINE) 500 MG tablet   . fluticasone (CUTIVATE) 0.05 % cream Apply 1 application topically as needed.  . fluticasone (FLONASE) 50 MCG/ACT nasal spray Place 2 sprays into both nostrils daily.  Marland Kitchen HYDROcodone-acetaminophen (NORCO) 5-325 MG per tablet 1/2 - 1 tablet q 8 hours prn  . HYDROcodone-homatropine (HYCODAN) 5-1.5 MG/5ML syrup Take 5 mLs by mouth every 8 (eight) hours as needed for cough.  . IRON PO Take by mouth.  . Multiple Vitamins-Minerals (MULTIVITAL PO) Take by mouth daily.  . pantoprazole (PROTONIX) 40 MG tablet Take 1 tablet (40 mg total) by mouth daily.  Take one tablet twice a day (30 minutes before breakfast and 30 minutes before your evening meal)  . triamcinolone cream (KENALOG) 0.1 % Apply 1 application topically 2 (two) times daily.  . [DISCONTINUED] HYDROcodone-homatropine (HYCODAN) 5-1.5 MG/5ML syrup Take 5 mLs by mouth every 8 (eight) hours as needed for cough.  . [DISCONTINUED] pantoprazole (PROTONIX) 40 MG tablet TAKE ONE TABLET BY MOUTH EVERY DAY  . predniSONE (DELTASONE) 10 MG tablet Take 6 tablets x 1 day and then decrease by 1/2 tablet per day until down to zero mg.   No facility-administered encounter medications on file as of 03/28/2017.     Review of Systems  Constitutional: Negative for fever.       Decreased appetite.    HENT: Positive for congestion and postnasal drip. Negative for sinus pressure.   Respiratory: Positive for cough. Negative for chest tightness and shortness of breath.   Cardiovascular: Negative for chest pain, palpitations and leg swelling.  Gastrointestinal: Negative for abdominal pain, diarrhea, nausea and vomiting.  Genitourinary: Negative for difficulty urinating and dysuria.  Musculoskeletal: Negative for joint swelling and myalgias.  Skin: Negative for color change and rash.  Neurological: Negative for dizziness, light-headedness and headaches.  Psychiatric/Behavioral: Negative for agitation and dysphoric mood.       Objective:    Physical Exam  Constitutional: She appears well-developed and well-nourished. No distress.  HENT:  Nose: Nose normal.  Mouth/Throat: Oropharynx is clear and moist.  Neck: Neck supple. No thyromegaly present.  Cardiovascular: Normal rate and regular rhythm.  Pulmonary/Chest: Breath sounds normal. No respiratory distress. She has no wheezes.  Increased cough with forced expiration.  No congestion.   Abdominal: Soft. Bowel sounds are normal. There is no tenderness.  Musculoskeletal: She exhibits no edema or tenderness.  Lymphadenopathy:    She has no cervical  adenopathy.  Skin: No rash noted. No erythema.  Psychiatric: She has a normal mood and affect. Her behavior is normal.    BP 110/78 (BP Location: Left Arm, Patient Position: Sitting, Cuff Size: Normal)   Pulse 72   Temp 97.9 F (36.6 C) (Oral)   Wt 140 lb (63.5 kg)   LMP 02/26/2002   SpO2 96%   BMI 25.61 kg/m  Wt Readings from Last 3 Encounters:  03/28/17 140 lb (63.5 kg)  12/19/16 139 lb 6.4 oz (63.2 kg)  08/21/16 141 lb 12.8 oz (64.3 kg)     Lab Results  Component Value Date   WBC 4.5 06/03/2016   HGB 11.8 (L) 06/03/2016   HCT 35.8 (L) 06/03/2016   PLT 389.0 06/03/2016   GLUCOSE 97 06/03/2016   CHOL 181 01/04/2015  TRIG 96.0 01/04/2015   HDL 62.70 01/04/2015   LDLCALC 99 01/04/2015   ALT 12 06/03/2016   AST 18 06/03/2016   NA 141 06/03/2016   K 4.6 06/03/2016   CL 105 06/03/2016   CREATININE 0.87 06/03/2016   BUN 11 06/03/2016   CO2 28 06/03/2016   TSH 2.27 05/16/2014       Assessment & Plan:   Problem List Items Addressed This Visit    GERD (gastroesophageal reflux disease)    On protonix daily.  With increased acid reflux.  Stop ibuprofen.  Increase protonix to bid.  Follow.  May help cough.        Relevant Medications   pantoprazole (PROTONIX) 40 MG tablet   Psoriatic arthritis ([place])    On otezla.  Followed by Dr Nehemiah Massed.        Stress    Increased stress as outlined.  Overall she feels she is handling things relatively well. Follow.        Viral upper respiratory infection    With persistent increased cough.  Do not feel abx warranted.  Will check cxr to confirm clear.  Prednisone taper as directed.  Refilled hycodan to have if needed.  Treat acid reflux.  Follow.         Other Visit Diagnoses    Cough    -  Primary   Relevant Orders   DG Chest 2 View (Completed)       Einar Pheasant, MD

## 2017-03-30 ENCOUNTER — Encounter: Payer: Self-pay | Admitting: Internal Medicine

## 2017-03-31 ENCOUNTER — Encounter: Payer: Self-pay | Admitting: Internal Medicine

## 2017-03-31 NOTE — Assessment & Plan Note (Signed)
Increased stress as outlined.  Overall she feels she is handling things relatively well. Follow.

## 2017-03-31 NOTE — Assessment & Plan Note (Signed)
On protonix daily.  With increased acid reflux.  Stop ibuprofen.  Increase protonix to bid.  Follow.  May help cough.

## 2017-03-31 NOTE — Assessment & Plan Note (Signed)
With persistent increased cough.  Do not feel abx warranted.  Will check cxr to confirm clear.  Prednisone taper as directed.  Refilled hycodan to have if needed.  Treat acid reflux.  Follow.

## 2017-03-31 NOTE — Assessment & Plan Note (Signed)
On otezla.  Followed by Dr Kowalski.  

## 2017-04-18 ENCOUNTER — Ambulatory Visit: Payer: Self-pay

## 2017-04-18 ENCOUNTER — Telehealth: Payer: Self-pay | Admitting: *Deleted

## 2017-04-18 NOTE — Telephone Encounter (Signed)
Patient wanted earlier time to be seen patient went to [facility] walk in.

## 2017-04-18 NOTE — Telephone Encounter (Signed)
Pt with persistent cough and congestion.  Instructed, if persistent symptoms needs evaluation.  If unable to be seen, can work her in during lunch.

## 2017-04-18 NOTE — Telephone Encounter (Signed)
Attempted to call patient back to triage.  No answer.

## 2017-06-19 ENCOUNTER — Ambulatory Visit: Payer: BC Managed Care – PPO | Admitting: Internal Medicine

## 2017-06-19 ENCOUNTER — Encounter: Payer: Self-pay | Admitting: Internal Medicine

## 2017-06-19 DIAGNOSIS — E611 Iron deficiency: Secondary | ICD-10-CM

## 2017-06-19 DIAGNOSIS — G5600 Carpal tunnel syndrome, unspecified upper limb: Secondary | ICD-10-CM | POA: Diagnosis not present

## 2017-06-19 DIAGNOSIS — K219 Gastro-esophageal reflux disease without esophagitis: Secondary | ICD-10-CM | POA: Diagnosis not present

## 2017-06-19 DIAGNOSIS — L4050 Arthropathic psoriasis, unspecified: Secondary | ICD-10-CM | POA: Diagnosis not present

## 2017-06-19 DIAGNOSIS — F439 Reaction to severe stress, unspecified: Secondary | ICD-10-CM | POA: Diagnosis not present

## 2017-06-19 MED ORDER — GABAPENTIN 100 MG PO CAPS: 100.0000 mg | ORAL_CAPSULE | Freq: Three times a day (TID) | ORAL | 1 refills | Status: DC | PRN

## 2017-06-19 NOTE — Progress Notes (Signed)
Patient ID: April Santos, female   DOB: 09-30-1955, 62 y.o.   MRN: [id number]   Subjective:    Patient ID: April Santos, female    DOB: [birth year], 62 y.o.   MRN: [id number]  HPI  Patient here for a scheduled follow up.  She reports increased pain in her right elbow down to her hand.  Saw ortho.  Recommended an elbow strap.  Also reports left shoulder discomfort/numbness.  Appears to be positional.  When moves, symptoms improve.  No neck pain or stiffness.  Uses her hands a lot.  Taking protonix bid.  Acid - improved.  No chest pain.  No sob.  No acid reflux.  No abdominal pain.  Bowels moving.     Past Medical History:  Diagnosis Date  . Arthritis   . Colon polyps   . Disc disorder   . Dysphagia   . GERD (gastroesophageal reflux disease)   . History of uterine fibroid    Past Surgical History:  Procedure Laterality Date  . ABDOMINAL HYSTERECTOMY    . BREAST SURGERY  1999   biopsy  . carpal  2005   Tunnel right hand  . COLONOSCOPY WITH ESOPHAGOGASTRODUODENOSCOPY (EGD)    . COLONOSCOPY WITH PROPOFOL N/A 06/10/2016   Procedure: COLONOSCOPY WITH PROPOFOL;  Surgeon: Manya Silvas, MD;  Location: [facility] ENDOSCOPY;  Service: Endoscopy;  Laterality: N/A;  . ESOPHAGOGASTRODUODENOSCOPY (EGD) WITH PROPOFOL N/A 06/10/2016   Procedure: ESOPHAGOGASTRODUODENOSCOPY (EGD) WITH PROPOFOL;  Surgeon: Manya Silvas, MD;  Location: [facility] ENDOSCOPY;  Service: Endoscopy;  Laterality: N/A;  . FOOT SURGERY  02/07/1003   right foot benign tumor  . SACROILIAC JOINT INJECTION  08/17/13 & 04/13/14   Dr. Sharlet Salina  . SIGMOIDOSCOPY    . UTERINE FIBROID SURGERY  1997   Family History  Problem Relation Age of Onset  . Breast cancer Mother        double masectomy  . Arthritis Father   . GER disease Father   . Heart disease Unknown        paternal uncles  . Colon cancer Neg Hx    Social History   Socioeconomic History  . Marital status: Divorced    Spouse name: None  . Number of children: 0  . Years  of education: None  . Highest education level: None  Social Needs  . Financial resource strain: None  . Food insecurity - worry: None  . Food insecurity - inability: None  . Transportation needs - medical: None  . Transportation needs - non-medical: None  Occupational History  . None  Tobacco Use  . Smoking status: Never Smoker  . Smokeless tobacco: Never Used  Substance and Sexual Activity  . Alcohol use: Yes    Alcohol/week: 0.6 oz    Types: 1 Glasses of wine per week    Comment: bimonthly  . Drug use: No  . Sexual activity: None  Other Topics Concern  . None  Social History Narrative  . None    Outpatient Encounter Medications as of 06/19/2017  Medication Sig  . ALPRAZolam (XANAX) 0.25 MG tablet Take 1 tablet (0.25 mg total) by mouth at bedtime as needed.  Marland Kitchen Apremilast (OTEZLA) 30 MG TABS Take 1 tablet by mouth daily.  Marland Kitchen BIOTIN PO Take by mouth.  . Calcium Citrate (CITRACAL PO) Take by mouth 4 (four) times daily.  . cyclobenzaprine (FLEXERIL) 5 MG tablet TAKE ONE TABLET BY MOUTH AT BEDTIME AS NEEDED  . estradiol (ESTRACE) 0.1 MG/GM vaginal cream  Place 2 g vaginally 2 (two) times a week.  . estradiol (ESTRACE) 1 MG tablet Take 1 tablet (1 mg total) by mouth daily.  Marland Kitchen etodolac (LODINE) 500 MG tablet   . fluticasone (CUTIVATE) 0.05 % cream Apply 1 application topically as needed.  . fluticasone (FLONASE) 50 MCG/ACT nasal spray Place 2 sprays into both nostrils daily.  Marland Kitchen HYDROcodone-acetaminophen (NORCO) 5-325 MG per tablet 1/2 - 1 tablet q 8 hours prn  . ibuprofen (ADVIL,MOTRIN) 200 MG tablet Take 200 mg by mouth every 6 (six) hours as needed.  . IRON PO Take by mouth.  . Multiple Vitamins-Minerals (MULTIVITAL PO) Take by mouth daily.  . pantoprazole (PROTONIX) 40 MG tablet Take 1 tablet (40 mg total) by mouth daily. Take one tablet twice a day (30 minutes before breakfast and 30 minutes before your evening meal)  . triamcinolone cream (KENALOG) 0.1 % Apply 1 application  topically 2 (two) times daily.  . [DISCONTINUED] HYDROcodone-homatropine (HYCODAN) 5-1.5 MG/5ML syrup Take 5 mLs by mouth every 8 (eight) hours as needed for cough.  . [DISCONTINUED] predniSONE (DELTASONE) 10 MG tablet Take 6 tablets x 1 day and then decrease by 1/2 tablet per day until down to zero mg.  . gabapentin (NEURONTIN) 100 MG capsule Take 1 capsule (100 mg total) by mouth 3 (three) times daily as needed.   No facility-administered encounter medications on file as of 06/19/2017.     Review of Systems  Constitutional: Negative for appetite change and unexpected weight change.  HENT: Negative for congestion and sinus pressure.   Respiratory: Negative for cough, chest tightness and shortness of breath.   Cardiovascular: Negative for chest pain, palpitations and leg swelling.  Gastrointestinal: Negative for abdominal pain, diarrhea, nausea and vomiting.  Genitourinary: Negative for difficulty urinating and dysuria.  Musculoskeletal: Negative for joint swelling and myalgias.       Increased pain - right elbow.  Left arm numbness.    Skin: Negative for color change and rash.  Neurological: Negative for dizziness, light-headedness and headaches.  Psychiatric/Behavioral: Negative for agitation and dysphoric mood.       Objective:    Physical Exam  Constitutional: She appears well-developed and well-nourished. No distress.  HENT:  Nose: Nose normal.  Mouth/Throat: Oropharynx is clear and moist.  Neck: Neck supple. No thyromegaly present.  Cardiovascular: Normal rate and regular rhythm.  Pulmonary/Chest: Breath sounds normal. No respiratory distress. She has no wheezes.  Abdominal: Soft. Bowel sounds are normal. There is no tenderness.  Musculoskeletal: She exhibits no edema.  Some tenderness to palpation - right lateral elbow.  Grip strength - normal.    Lymphadenopathy:    She has no cervical adenopathy.  Skin: No rash noted. No erythema.  Psychiatric: She has a normal mood and  affect. Her behavior is normal.    BP 120/72 (BP Location: Left Arm, Cuff Size: Normal)   Pulse 67   Temp 98.5 F (36.9 C) (Oral)   Resp 20   Wt 135 lb (61.2 kg)   LMP 02/26/2002   SpO2 98%   BMI 24.69 kg/m  Wt Readings from Last 3 Encounters:  06/19/17 135 lb (61.2 kg)  03/28/17 140 lb (63.5 kg)  12/19/16 139 lb 6.4 oz (63.2 kg)     Lab Results  Component Value Date   WBC 4.5 06/03/2016   HGB 11.8 (L) 06/03/2016   HCT 35.8 (L) 06/03/2016   PLT 389.0 06/03/2016   GLUCOSE 97 06/03/2016   CHOL 181 01/04/2015   TRIG  96.0 01/04/2015   HDL 62.70 01/04/2015   LDLCALC 99 01/04/2015   ALT 12 06/03/2016   AST 18 06/03/2016   NA 141 06/03/2016   K 4.6 06/03/2016   CL 105 06/03/2016   CREATININE 0.87 06/03/2016   BUN 11 06/03/2016   CO2 28 06/03/2016   TSH 2.27 05/16/2014       Assessment & Plan:   Problem List Items Addressed This Visit    Carpal tunnel syndrome    Is s/p surgery on the right.  With increased pain - right elbow.  Concern regarding medial epicondylitis.  Discussed elbow strap.  With numbness - left arm - discussed NCS.  She wants to hold.  Follow.  Wrist splints.        Relevant Medications   gabapentin (NEURONTIN) 100 MG capsule   GERD (gastroesophageal reflux disease)    Controlled on protonix.  Taking bid for now.  Follow.        Low iron    Had EGD and colonoscopy.  Gives blood.  Follow cbc and iron.       Psoriatic arthritis ([place])    On otezla.  Followed by Dr Nehemiah Massed.        Stress    Increased stress.  Discussed with her today.  Does not feel she needs anything more at this time.  Follow.            Einar Pheasant, MD

## 2017-06-20 ENCOUNTER — Other Ambulatory Visit: Payer: Self-pay | Admitting: Internal Medicine

## 2017-06-20 ENCOUNTER — Telehealth: Payer: Self-pay | Admitting: Radiology

## 2017-06-20 DIAGNOSIS — K219 Gastro-esophageal reflux disease without esophagitis: Secondary | ICD-10-CM

## 2017-06-20 DIAGNOSIS — F439 Reaction to severe stress, unspecified: Secondary | ICD-10-CM

## 2017-06-20 DIAGNOSIS — L4050 Arthropathic psoriasis, unspecified: Secondary | ICD-10-CM

## 2017-06-20 DIAGNOSIS — Z13220 Encounter for screening for lipoid disorders: Secondary | ICD-10-CM

## 2017-06-20 DIAGNOSIS — D649 Anemia, unspecified: Secondary | ICD-10-CM

## 2017-06-20 NOTE — Telephone Encounter (Signed)
Pt coming in for labs Monday, please place future orders. Thank you 

## 2017-06-20 NOTE — Progress Notes (Signed)
Orders placed for f/u labs.  

## 2017-06-20 NOTE — Telephone Encounter (Signed)
Orders placed for f/u labs.  

## 2017-06-22 ENCOUNTER — Encounter: Payer: Self-pay | Admitting: Internal Medicine

## 2017-06-22 NOTE — Assessment & Plan Note (Signed)
Controlled on protonix.  Taking bid for now.  Follow.

## 2017-06-22 NOTE — Assessment & Plan Note (Signed)
Had EGD and colonoscopy.  Gives blood.  Follow cbc and iron.

## 2017-06-22 NOTE — Assessment & Plan Note (Signed)
On otezla.  Followed by Dr Kowalski.  

## 2017-06-22 NOTE — Assessment & Plan Note (Signed)
Increased stress.  Discussed with her today.  Does not feel she needs anything more at this time.  Follow.

## 2017-06-22 NOTE — Assessment & Plan Note (Signed)
Is s/p surgery on the right.  With increased pain - right elbow.  Concern regarding medial epicondylitis.  Discussed elbow strap.  With numbness - left arm - discussed NCS.  She wants to hold.  Follow.  Wrist splints.

## 2017-06-23 ENCOUNTER — Other Ambulatory Visit (INDEPENDENT_AMBULATORY_CARE_PROVIDER_SITE_OTHER): Payer: BC Managed Care – PPO

## 2017-06-23 DIAGNOSIS — D649 Anemia, unspecified: Secondary | ICD-10-CM | POA: Diagnosis not present

## 2017-06-23 DIAGNOSIS — L4050 Arthropathic psoriasis, unspecified: Secondary | ICD-10-CM | POA: Diagnosis not present

## 2017-06-23 DIAGNOSIS — Z13220 Encounter for screening for lipoid disorders: Secondary | ICD-10-CM

## 2017-06-23 LAB — BASIC METABOLIC PANEL
BUN: 12 mg/dL (ref 6–23)
CALCIUM: 9.4 mg/dL (ref 8.4–10.5)
CHLORIDE: 103 meq/L (ref 96–112)
CO2: 30 meq/L (ref 19–32)
CREATININE: 0.73 mg/dL (ref 0.40–1.20)
GFR: 85.57 mL/min (ref >60.00)
Glucose, Bld: 93 mg/dL (ref 70–99)
Potassium: 4.1 mEq/L (ref 3.5–5.1)
Sodium: 140 mEq/L (ref 135–145)

## 2017-06-23 LAB — CBC WITH DIFFERENTIAL/PLATELET
BASOS ABS: 0 10*3/uL (ref 0.0–0.1)
Basophils Relative: 0.4 % (ref 0.0–3.0)
EOS ABS: 0.1 10*3/uL (ref 0.0–0.7)
Eosinophils Relative: 1.1 % (ref 0.0–5.0)
HEMATOCRIT: 35.6 % — AB (ref 36.0–46.0)
Hemoglobin: 11.9 g/dL — ABNORMAL LOW (ref 12.0–15.0)
LYMPHS PCT: 41.6 % (ref 12.0–46.0)
Lymphs Abs: 1.8 10*3/uL (ref 0.7–4.0)
MCHC: 33.5 g/dL (ref 30.0–36.0)
MCV: 78.1 fl (ref 78.0–100.0)
Monocytes Absolute: 0.5 10*3/uL (ref 0.1–1.0)
Monocytes Relative: 11.2 % (ref 3.0–12.0)
Neutro Abs: 2 10*3/uL (ref 1.4–7.7)
Neutrophils Relative %: 45.7 % (ref 43.0–77.0)
Platelets: 323 10*3/uL (ref 150.0–400.0)
RBC: 4.55 Mil/uL (ref 3.87–5.11)
RDW: 16.6 % — ABNORMAL HIGH (ref 11.5–15.5)
WBC: 4.4 10*3/uL (ref 4.0–10.5)

## 2017-06-23 LAB — LIPID PANEL
CHOL/HDL RATIO: 3
Cholesterol: 156 mg/dL (ref 0–200)
HDL: 62.1 mg/dL (ref >39.00)
LDL CALC: 71 mg/dL (ref 0–99)
NONHDL: 93.96
TRIGLYCERIDES: 113 mg/dL (ref 0.0–149.0)
VLDL: 22.6 mg/dL (ref 0.0–40.0)

## 2017-06-23 LAB — HEPATIC FUNCTION PANEL
ALBUMIN: 3.9 g/dL (ref 3.5–5.2)
ALT: 10 U/L (ref 0–35)
AST: 14 U/L (ref 0–37)
Alkaline Phosphatase: 76 U/L (ref 39–117)
Bilirubin, Direct: 0.1 mg/dL (ref 0.0–0.3)
TOTAL PROTEIN: 6.6 g/dL (ref 6.0–8.3)
Total Bilirubin: 0.6 mg/dL (ref 0.2–1.2)

## 2017-06-23 LAB — TSH: TSH: 2.74 u[IU]/mL (ref 0.35–4.50)

## 2017-06-23 LAB — VITAMIN D 25 HYDROXY (VIT D DEFICIENCY, FRACTURES): VITD: 17.91 ng/mL — ABNORMAL LOW (ref 30.00–100.00)

## 2017-06-23 LAB — VITAMIN B12: VITAMIN B 12: 324 pg/mL (ref 211–911)

## 2017-06-23 LAB — FERRITIN: Ferritin: 10.3 ng/mL (ref 10.0–291.0)

## 2017-08-03 ENCOUNTER — Other Ambulatory Visit: Payer: Self-pay | Admitting: Internal Medicine

## 2017-08-05 MED ORDER — ALPRAZOLAM 0.25 MG PO TABS: 0.2500 mg | ORAL_TABLET | Freq: Every evening | ORAL | 0 refills | Status: DC | PRN

## 2017-08-05 NOTE — Telephone Encounter (Signed)
Last OV 06/19/17 Next OV 08/15/17 Last refill 11/19/16

## 2017-08-14 ENCOUNTER — Other Ambulatory Visit: Payer: Self-pay | Admitting: Internal Medicine

## 2017-08-15 ENCOUNTER — Ambulatory Visit: Payer: BC Managed Care – PPO | Admitting: Internal Medicine

## 2017-08-15 DIAGNOSIS — M79641 Pain in right hand: Secondary | ICD-10-CM

## 2017-08-15 DIAGNOSIS — Z9109 Other allergy status, other than to drugs and biological substances: Secondary | ICD-10-CM

## 2017-08-15 DIAGNOSIS — K219 Gastro-esophageal reflux disease without esophagitis: Secondary | ICD-10-CM | POA: Diagnosis not present

## 2017-08-15 DIAGNOSIS — L4050 Arthropathic psoriasis, unspecified: Secondary | ICD-10-CM | POA: Diagnosis not present

## 2017-08-15 DIAGNOSIS — F439 Reaction to severe stress, unspecified: Secondary | ICD-10-CM | POA: Diagnosis not present

## 2017-08-15 NOTE — Progress Notes (Signed)
Patient ID: April Santos, female   DOB: October 26, 1955, 62 y.o.   MRN: [id number]   Subjective:    Patient ID: April Santos, female    DOB: 03-06-1956, 62 y.o.   MRN: [id number]  HPI  Patient here for a scheduled follow up.  Still with increased stress with her mother's health issues.  Discussed with her today.  Overall handling things relatively well.  Previously have pain in her right elbow extending to her hand.  Tried splint.  Also on gabapentin.  Feels gabapentin is helping her arm.  Still with pain - right thumb/hand.  Seeing Dr Nehemiah Massed for psoriatic arthritis.  On Otezla.  Requesting referral to rheumatology.  Feel having some joint involvement.  Tries to stay active.  No chest pain. No sob.  No acid reflux.  No abdominal pain.  Bowels moving.     Past Medical History:  Diagnosis Date  . Arthritis   . Colon polyps   . Disc disorder   . Dysphagia   . GERD (gastroesophageal reflux disease)   . History of uterine fibroid    Past Surgical History:  Procedure Laterality Date  . ABDOMINAL HYSTERECTOMY    . BREAST SURGERY  1999   biopsy  . carpal  2005   Tunnel right hand  . COLONOSCOPY WITH ESOPHAGOGASTRODUODENOSCOPY (EGD)    . COLONOSCOPY WITH PROPOFOL N/A 06/10/2016   Procedure: COLONOSCOPY WITH PROPOFOL;  Surgeon: Manya Silvas, MD;  Location: [facility] ENDOSCOPY;  Service: Endoscopy;  Laterality: N/A;  . ESOPHAGOGASTRODUODENOSCOPY (EGD) WITH PROPOFOL N/A 06/10/2016   Procedure: ESOPHAGOGASTRODUODENOSCOPY (EGD) WITH PROPOFOL;  Surgeon: Manya Silvas, MD;  Location: [facility] ENDOSCOPY;  Service: Endoscopy;  Laterality: N/A;  . FOOT SURGERY  02/07/1003   right foot benign tumor  . SACROILIAC JOINT INJECTION  08/17/13 & 04/13/14   Dr. Sharlet Salina  . SIGMOIDOSCOPY    . UTERINE FIBROID SURGERY  1997   Family History  Problem Relation Age of Onset  . Breast cancer Mother        double masectomy  . Arthritis Father   . GER disease Father   . Heart disease Unknown        paternal uncles    . Colon cancer Neg Hx    Social History   Socioeconomic History  . Marital status: Divorced    Spouse name: Not on file  . Number of children: 0  . Years of education: Not on file  . Highest education level: Not on file  Occupational History  . Not on file  Social Needs  . Financial resource strain: Not on file  . Food insecurity:    Worry: Not on file    Inability: Not on file  . Transportation needs:    Medical: Not on file    Non-medical: Not on file  Tobacco Use  . Smoking status: Never Smoker  . Smokeless tobacco: Never Used  Substance and Sexual Activity  . Alcohol use: Yes    Alcohol/week: 0.6 oz    Types: 1 Glasses of wine per week    Comment: bimonthly  . Drug use: No  . Sexual activity: Not on file  Lifestyle  . Physical activity:    Days per week: Not on file    Minutes per session: Not on file  . Stress: Not on file  Relationships  . Social connections:    Talks on phone: Not on file    Gets together: Not on file    Attends religious service:  Not on file    Active member of club or organization: Not on file    Attends meetings of clubs or organizations: Not on file    Relationship status: Not on file  Other Topics Concern  . Not on file  Social History Narrative  . Not on file    Outpatient Encounter Medications as of 08/15/2017  Medication Sig  . ALPRAZolam (XANAX) 0.25 MG tablet Take 1 tablet (0.25 mg total) by mouth at bedtime as needed.  Marland Kitchen Apremilast (OTEZLA) 30 MG TABS Take 1 tablet by mouth daily.  Marland Kitchen BIOTIN PO Take by mouth.  . Calcium Citrate (CITRACAL PO) Take by mouth 4 (four) times daily.  . cyclobenzaprine (FLEXERIL) 5 MG tablet TAKE ONE TABLET BY MOUTH AT BEDTIME AS NEEDED  . diazepam (VALIUM) 10 MG tablet   . estradiol (ESTRACE) 0.1 MG/GM vaginal cream Place 2 g vaginally 2 (two) times a week.  . estradiol (ESTRACE) 1 MG tablet Take 1 tablet (1 mg total) by mouth daily.  Marland Kitchen etodolac (LODINE) 500 MG tablet   . fluticasone (CUTIVATE)  0.05 % cream Apply 1 application topically as needed.  . fluticasone (FLONASE) 50 MCG/ACT nasal spray Place 2 sprays into both nostrils daily.  Marland Kitchen gabapentin (NEURONTIN) 100 MG capsule TAKE 1 CAPSULE BY MOUTH THREE TIMES DAILY  . HYDROcodone-acetaminophen (NORCO) 5-325 MG per tablet 1/2 - 1 tablet q 8 hours prn  . ibuprofen (ADVIL,MOTRIN) 200 MG tablet Take 200 mg by mouth every 6 (six) hours as needed.  . IRON PO Take by mouth.  . Multiple Vitamins-Minerals (MULTIVITAL PO) Take by mouth daily.  . pantoprazole (PROTONIX) 40 MG tablet Take 1 tablet (40 mg total) by mouth daily. Take one tablet twice a day (30 minutes before breakfast and 30 minutes before your evening meal)  . triamcinolone cream (KENALOG) 0.1 % Apply 1 application topically 2 (two) times daily.  . [DISCONTINUED] gabapentin (NEURONTIN) 100 MG capsule Take 1 capsule (100 mg total) by mouth 3 (three) times daily as needed.   No facility-administered encounter medications on file as of 08/15/2017.     Review of Systems  Constitutional: Negative for appetite change and unexpected weight change.  HENT: Negative for congestion and sinus pressure.   Respiratory: Negative for cough, chest tightness and shortness of breath.   Cardiovascular: Negative for chest pain, palpitations and leg swelling.  Gastrointestinal: Negative for abdominal pain, diarrhea, nausea and vomiting.  Genitourinary: Negative for difficulty urinating and dysuria.  Musculoskeletal: Negative for myalgias.       Increased pain - right thumb /hand.    Skin: Negative for color change and wound.  Neurological: Negative for dizziness, light-headedness and headaches.  Psychiatric/Behavioral: Negative for agitation and dysphoric mood.       Increased stress as outlined.         Objective:    Physical Exam  Constitutional: She appears well-developed and well-nourished. No distress.  HENT:  Nose: Nose normal.  Mouth/Throat: Oropharynx is clear and moist.  Neck:  Neck supple. No thyromegaly present.  Cardiovascular: Normal rate and regular rhythm.  Pulmonary/Chest: Breath sounds normal. No respiratory distress. She has no wheezes.  Abdominal: Soft. Bowel sounds are normal. There is no tenderness.  Musculoskeletal: She exhibits no edema or tenderness.  Lymphadenopathy:    She has no cervical adenopathy.  Skin: No rash noted. No erythema.  Psychiatric: She has a normal mood and affect. Her behavior is normal.    BP 116/74 (BP Location: Left Arm, Patient Position:  Sitting, Cuff Size: Normal)   Pulse 68   Temp (!) 97.4 F (36.3 C) (Oral)   Resp 16   Wt 137 lb 3.2 oz (62.2 kg)   LMP 02/26/2002   SpO2 98%   BMI 25.09 kg/m  Wt Readings from Last 3 Encounters:  08/15/17 137 lb 3.2 oz (62.2 kg)  06/19/17 135 lb (61.2 kg)  03/28/17 140 lb (63.5 kg)     Lab Results  Component Value Date   WBC 4.4 06/23/2017   HGB 11.9 (L) 06/23/2017   HCT 35.6 (L) 06/23/2017   PLT 323.0 06/23/2017   GLUCOSE 93 06/23/2017   CHOL 156 06/23/2017   TRIG 113.0 06/23/2017   HDL 62.10 06/23/2017   LDLCALC 71 06/23/2017   ALT 10 06/23/2017   AST 14 06/23/2017   NA 140 06/23/2017   K 4.1 06/23/2017   CL 103 06/23/2017   CREATININE 0.73 06/23/2017   BUN 12 06/23/2017   CO2 30 06/23/2017   TSH 2.74 06/23/2017       Assessment & Plan:   Problem List Items Addressed This Visit    Environmental allergies    Followed by Dr Donneta Romberg.  Stable.       GERD (gastroesophageal reflux disease)    Stable on current regimen.  Follow.       Psoriatic arthritis ([place])    Followed by Dr Nehemiah Massed.  On Otezla.  Request referral to rheumatology.        Relevant Orders   Ambulatory referral to Rheumatology   Right hand pain    Persistent.  Has tried splint.  Gabapentin helping arm pain some.  Refer to rheumatology for further evaluation and question of need for injection.        Relevant Orders   Ambulatory referral to Rheumatology   Stress    Increased stress as  outlined.  Discussed with her today.  Does not feel needs any further intervention at this time.  Follow.            Einar Pheasant, MD

## 2017-08-15 NOTE — Telephone Encounter (Signed)
Patient has appointment today, last filled 06/19/17 90 [id number]

## 2017-08-18 ENCOUNTER — Encounter: Payer: Self-pay | Admitting: Internal Medicine

## 2017-08-18 NOTE — Assessment & Plan Note (Signed)
Stable on current regimen.  Follow.   

## 2017-08-18 NOTE — Assessment & Plan Note (Signed)
Followed by Dr Nehemiah Massed.  On Otezla.  Request referral to rheumatology.

## 2017-08-18 NOTE — Assessment & Plan Note (Signed)
Followed by Dr Donneta Romberg.  Stable.

## 2017-08-18 NOTE — Assessment & Plan Note (Signed)
Persistent.  Has tried splint.  Gabapentin helping arm pain some.  Refer to rheumatology for further evaluation and question of need for injection.

## 2017-08-18 NOTE — Assessment & Plan Note (Signed)
Increased stress as outlined.  Discussed with her today.  Does not feel needs any further intervention at this time.  Follow.   °

## 2017-09-04 DIAGNOSIS — M159 Polyosteoarthritis, unspecified: Secondary | ICD-10-CM | POA: Insufficient documentation

## 2017-09-26 LAB — HM MAMMOGRAPHY

## 2017-10-27 LAB — HM MAMMOGRAPHY

## 2017-12-16 ENCOUNTER — Encounter: Payer: BC Managed Care – PPO | Admitting: Internal Medicine

## 2017-12-29 ENCOUNTER — Encounter: Payer: Self-pay | Admitting: Internal Medicine

## 2018-01-06 ENCOUNTER — Other Ambulatory Visit: Payer: Self-pay | Admitting: Internal Medicine

## 2018-01-19 ENCOUNTER — Ambulatory Visit: Payer: BC Managed Care – PPO | Admitting: Internal Medicine

## 2018-01-19 ENCOUNTER — Encounter: Payer: Self-pay | Admitting: Internal Medicine

## 2018-01-19 DIAGNOSIS — F439 Reaction to severe stress, unspecified: Secondary | ICD-10-CM

## 2018-01-19 DIAGNOSIS — Z23 Encounter for immunization: Secondary | ICD-10-CM | POA: Diagnosis not present

## 2018-01-19 DIAGNOSIS — K219 Gastro-esophageal reflux disease without esophagitis: Secondary | ICD-10-CM | POA: Diagnosis not present

## 2018-01-19 DIAGNOSIS — L4050 Arthropathic psoriasis, unspecified: Secondary | ICD-10-CM | POA: Diagnosis not present

## 2018-01-19 DIAGNOSIS — E611 Iron deficiency: Secondary | ICD-10-CM | POA: Diagnosis not present

## 2018-01-19 DIAGNOSIS — Z8619 Personal history of other infectious and parasitic diseases: Secondary | ICD-10-CM | POA: Diagnosis not present

## 2018-01-19 NOTE — Progress Notes (Signed)
Pre visit review using our clinic review tool, if applicable. No additional management support is needed unless otherwise documented below in the visit note. 

## 2018-01-19 NOTE — Progress Notes (Signed)
Patient ID: April Santos, female   DOB: Jul 07, 1955, 62 y.o.   MRN: [id number]   Subjective:    Patient ID: April Santos, female    DOB: Feb 18, 1956, 62 y.o.   MRN: [id number]  HPI  Patient here for a scheduled follow up.  She reports she is doing relatively well.  Has been having left foot pain.  Saw podiatry.  Diagnosed with plantar fasciitis.  Given prednisone taper.  Helped.  Also seeing rheumatology for psoriatic arthritis.  Note reviewed.  Having some pain at the base of her thumb.  Discussed thumb spica.  States she is up to date with her gyn exams.  Obtain records from Dr Georga Bora.  No chest pain.  No sob.  No acid reflux.  No abdominal pain.  Bowels moving.    Past Medical History:  Diagnosis Date  . Arthritis   . Colon polyps   . Disc disorder   . Dysphagia   . GERD (gastroesophageal reflux disease)   . History of uterine fibroid    Past Surgical History:  Procedure Laterality Date  . ABDOMINAL HYSTERECTOMY    . BREAST SURGERY  1999   biopsy  . carpal  2005   Tunnel right hand  . COLONOSCOPY WITH ESOPHAGOGASTRODUODENOSCOPY (EGD)    . COLONOSCOPY WITH PROPOFOL N/A 06/10/2016   Procedure: COLONOSCOPY WITH PROPOFOL;  Surgeon: Manya Silvas, MD;  Location: [facility] ENDOSCOPY;  Service: Endoscopy;  Laterality: N/A;  . ESOPHAGOGASTRODUODENOSCOPY (EGD) WITH PROPOFOL N/A 06/10/2016   Procedure: ESOPHAGOGASTRODUODENOSCOPY (EGD) WITH PROPOFOL;  Surgeon: Manya Silvas, MD;  Location: [facility] ENDOSCOPY;  Service: Endoscopy;  Laterality: N/A;  . FOOT SURGERY  02/07/1003   right foot benign tumor  . SACROILIAC JOINT INJECTION  08/17/13 & 04/13/14   Dr. Sharlet Salina  . SIGMOIDOSCOPY    . UTERINE FIBROID SURGERY  1997   Family History  Problem Relation Age of Onset  . Breast cancer Mother        double masectomy  . Arthritis Father   . GER disease Father   . Heart disease Unknown        paternal uncles  . Colon cancer Neg Hx    Social History   Socioeconomic History  . Marital  status: Divorced    Spouse name: Not on file  . Number of children: 0  . Years of education: Not on file  . Highest education level: Not on file  Occupational History  . Not on file  Social Needs  . Financial resource strain: Not on file  . Food insecurity:    Worry: Not on file    Inability: Not on file  . Transportation needs:    Medical: Not on file    Non-medical: Not on file  Tobacco Use  . Smoking status: Never Smoker  . Smokeless tobacco: Never Used  Substance and Sexual Activity  . Alcohol use: Yes    Alcohol/week: 1.0 standard drinks    Types: 1 Glasses of wine per week    Comment: bimonthly  . Drug use: No  . Sexual activity: Not on file  Lifestyle  . Physical activity:    Days per week: Not on file    Minutes per session: Not on file  . Stress: Not on file  Relationships  . Social connections:    Talks on phone: Not on file    Gets together: Not on file    Attends religious service: Not on file    Active member of club  or organization: Not on file    Attends meetings of clubs or organizations: Not on file    Relationship status: Not on file  Other Topics Concern  . Not on file  Social History Narrative  . Not on file    Outpatient Encounter Medications as of 01/19/2018  Medication Sig  . ALPRAZolam (XANAX) 0.25 MG tablet Take 1 tablet (0.25 mg total) by mouth at bedtime as needed.  Marland Kitchen Apremilast (OTEZLA) 30 MG TABS Take 1 tablet by mouth daily.  Marland Kitchen BIOTIN PO Take by mouth.  . Calcium Citrate (CITRACAL PO) Take by mouth 4 (four) times daily.  . diazepam (VALIUM) 10 MG tablet   . estradiol (ESTRACE) 0.1 MG/GM vaginal cream Place 2 g vaginally 2 (two) times a week.  . estradiol (ESTRACE) 1 MG tablet Take 1 tablet (1 mg total) by mouth daily.  Marland Kitchen etodolac (LODINE) 500 MG tablet   . fluticasone (CUTIVATE) 0.05 % cream Apply 1 application topically as needed.  Marland Kitchen HYDROcodone-acetaminophen (NORCO) 5-325 MG per tablet 1/2 - 1 tablet q 8 hours prn  . ibuprofen  (ADVIL,MOTRIN) 200 MG tablet Take 200 mg by mouth every 6 (six) hours as needed.  . IRON PO Take by mouth.  . Multiple Vitamins-Minerals (MULTIVITAL PO) Take by mouth daily.  . pantoprazole (PROTONIX) 40 MG tablet Take 1 tablet (40 mg total) by mouth daily. Take one tablet twice a day (30 minutes before breakfast and 30 minutes before your evening meal)  . triamcinolone cream (KENALOG) 0.1 % Apply 1 application topically 2 (two) times daily.  . [DISCONTINUED] cyclobenzaprine (FLEXERIL) 5 MG tablet TAKE ONE TABLET BY MOUTH AT BEDTIME AS NEEDED  . [DISCONTINUED] fluticasone (FLONASE) 50 MCG/ACT nasal spray Place 2 sprays into both nostrils daily.  . [DISCONTINUED] gabapentin (NEURONTIN) 100 MG capsule TAKE 1 CAPSULE BY MOUTH 3 TIMES DAILY   No facility-administered encounter medications on file as of 01/19/2018.     Review of Systems  Constitutional: Negative for appetite change and unexpected weight change.  HENT: Negative for congestion and sinus pressure.   Respiratory: Negative for cough, chest tightness and shortness of breath.   Cardiovascular: Negative for chest pain, palpitations and leg swelling.  Gastrointestinal: Negative for abdominal pain, diarrhea, nausea and vomiting.  Genitourinary: Negative for difficulty urinating and dysuria.  Musculoskeletal:       Foot pain as outlined.  Better after prednisone taper.  Following with rheumatology.  Pain - base of thumb.    Skin: Negative for color change and rash.  Neurological: Negative for dizziness, light-headedness and headaches.  Psychiatric/Behavioral: Negative for agitation and dysphoric mood.       Objective:    Physical Exam  Constitutional: She appears well-developed and well-nourished. No distress.  HENT:  Nose: Nose normal.  Mouth/Throat: Oropharynx is clear and moist.  Neck: Neck supple. No thyromegaly present.  Cardiovascular: Normal rate and regular rhythm.  Pulmonary/Chest: Breath sounds normal. No respiratory  distress. She has no wheezes.  Abdominal: Soft. Bowel sounds are normal. There is no tenderness.  Musculoskeletal: She exhibits no edema or tenderness.  Lymphadenopathy:    She has no cervical adenopathy.  Skin: No rash noted. No erythema.  Psychiatric: She has a normal mood and affect. Her behavior is normal.    BP 110/60   Pulse 70   Temp (!) 97.5 F (36.4 C) (Oral)   Ht 5\' 2"  (1.575 m)   Wt 138 lb (62.6 kg)   LMP 02/26/2002   SpO2 96%  BMI 25.24 kg/m  Wt Readings from Last 3 Encounters:  01/19/18 138 lb (62.6 kg)  08/15/17 137 lb 3.2 oz (62.2 kg)  06/19/17 135 lb (61.2 kg)     Lab Results  Component Value Date   WBC 4.4 06/23/2017   HGB 11.9 (L) 06/23/2017   HCT 35.6 (L) 06/23/2017   PLT 323.0 06/23/2017   GLUCOSE 93 06/23/2017   CHOL 156 06/23/2017   TRIG 113.0 06/23/2017   HDL 62.10 06/23/2017   LDLCALC 71 06/23/2017   ALT 10 06/23/2017   AST 14 06/23/2017   NA 140 06/23/2017   K 4.1 06/23/2017   CL 103 06/23/2017   CREATININE 0.73 06/23/2017   BUN 12 06/23/2017   CO2 30 06/23/2017   TSH 2.74 06/23/2017       Assessment & Plan:   Problem List Items Addressed This Visit    GERD (gastroesophageal reflux disease)    Controlled on current regimen.  Follow.        History of condyloma acuminatum    Followed by gyn.  Obtain records.        Low iron    Follow cbc and ferritin.        Psoriatic arthritis ([place])    Followed by Dr Nehemiah Massed.  On Otezla.  Seeing rheumatology.        Stress    Increased stress.  Discussed with her today.  Overall she feels she is handling things relatively well.  Does not feel needs any further intervention.         Other Visit Diagnoses    Need for immunization against influenza       Relevant Orders   Flu Vaccine QUAD 36+ mos IM (Completed)       Einar Pheasant, MD

## 2018-01-26 ENCOUNTER — Encounter: Payer: Self-pay | Admitting: Internal Medicine

## 2018-01-26 NOTE — Assessment & Plan Note (Signed)
Followed by gyn.  Obtain records.

## 2018-01-26 NOTE — Assessment & Plan Note (Signed)
Controlled on current regimen.  Follow.  

## 2018-01-26 NOTE — Assessment & Plan Note (Signed)
Followed by Dr Nehemiah Massed.  On Otezla.  Seeing rheumatology.

## 2018-01-26 NOTE — Assessment & Plan Note (Signed)
Increased stress.  Discussed with her today.  Overall she feels she is handling things relatively well.  Does not feel needs any further intervention.

## 2018-01-26 NOTE — Assessment & Plan Note (Signed)
Follow cbc and ferritin.  

## 2018-03-16 ENCOUNTER — Other Ambulatory Visit: Payer: Self-pay | Admitting: Internal Medicine

## 2018-03-16 ENCOUNTER — Encounter: Payer: Self-pay | Admitting: Internal Medicine

## 2018-03-16 MED ORDER — ALPRAZOLAM 0.25 MG PO TABS: 0.2500 mg | ORAL_TABLET | Freq: Every evening | ORAL | 0 refills | Status: DC | PRN

## 2018-03-16 NOTE — Telephone Encounter (Signed)
Refill request sent to provider.

## 2018-03-16 NOTE — Telephone Encounter (Signed)
Patients mother passed away last 07-21-22 and the funeral is tomorrow.

## 2018-03-16 NOTE — Telephone Encounter (Signed)
rx ok'd for alprazolam #30 with no refills.   

## 2018-03-31 ENCOUNTER — Encounter: Payer: BC Managed Care – PPO | Admitting: Internal Medicine

## 2018-03-31 ENCOUNTER — Encounter

## 2018-04-14 ENCOUNTER — Other Ambulatory Visit: Payer: Self-pay | Admitting: Internal Medicine

## 2018-04-15 ENCOUNTER — Other Ambulatory Visit: Payer: Self-pay | Admitting: Internal Medicine

## 2018-04-15 ENCOUNTER — Other Ambulatory Visit: Payer: Self-pay

## 2018-04-15 ENCOUNTER — Encounter: Payer: Self-pay | Admitting: Internal Medicine

## 2018-04-15 MED ORDER — PANTOPRAZOLE SODIUM 40 MG PO TBEC: 40.0000 mg | DELAYED_RELEASE_TABLET | Freq: Two times a day (BID) | ORAL | 1 refills | Status: DC

## 2018-04-15 MED ORDER — ALPRAZOLAM 0.25 MG PO TABS: 0.2500 mg | ORAL_TABLET | Freq: Every evening | ORAL | 0 refills | Status: DC | PRN

## 2018-04-15 NOTE — Telephone Encounter (Signed)
This is a duplicate request

## 2018-04-15 NOTE — Telephone Encounter (Signed)
Last OV 01/19/2018  Next OV 07/30/2018  Last refill 03/16/18

## 2018-07-27 ENCOUNTER — Telehealth: Payer: Self-pay

## 2018-07-27 NOTE — Telephone Encounter (Signed)
Pt was returning your call. Is there something I need to do with this or was she just needing to be scheduled?

## 2018-07-27 NOTE — Telephone Encounter (Signed)
Copied from [place] [id number]. Topic: General - Other >> Jul 27, 2018  2:42 PM [name], NT wrote: Reason for CRM: Patient returning call to [facility]. Attempted office, no answer. Please advise.

## 2018-07-28 NOTE — Telephone Encounter (Signed)
No I spoke with pt. I just needed to ask her about Doxy.

## 2018-07-28 NOTE — Telephone Encounter (Signed)
noted 

## 2018-07-30 ENCOUNTER — Other Ambulatory Visit: Payer: Self-pay

## 2018-07-30 ENCOUNTER — Encounter: Payer: Self-pay | Admitting: Internal Medicine

## 2018-07-30 ENCOUNTER — Ambulatory Visit (INDEPENDENT_AMBULATORY_CARE_PROVIDER_SITE_OTHER): Payer: BC Managed Care – PPO | Admitting: Internal Medicine

## 2018-07-30 DIAGNOSIS — L4050 Arthropathic psoriasis, unspecified: Secondary | ICD-10-CM

## 2018-07-30 DIAGNOSIS — E559 Vitamin D deficiency, unspecified: Secondary | ICD-10-CM

## 2018-07-30 DIAGNOSIS — F439 Reaction to severe stress, unspecified: Secondary | ICD-10-CM

## 2018-07-30 DIAGNOSIS — Z9109 Other allergy status, other than to drugs and biological substances: Secondary | ICD-10-CM | POA: Diagnosis not present

## 2018-07-30 DIAGNOSIS — E611 Iron deficiency: Secondary | ICD-10-CM

## 2018-07-30 DIAGNOSIS — K219 Gastro-esophageal reflux disease without esophagitis: Secondary | ICD-10-CM | POA: Diagnosis not present

## 2018-07-30 NOTE — Progress Notes (Addendum)
Patient ID: April Santos, female   DOB: 1955/07/16, 63 y.o.   MRN: [id number] Virtual Visit via video Note  This visit type was conducted due to national recommendations for restrictions regarding the COVID-19 pandemic (e.g. social distancing).  This format is felt to be most appropriate for this patient at this time.  All issues noted in this document were discussed and addressed.  No physical exam was performed (except for noted visual exam findings with Video Visits).   I connected with Volney [employer] by a video enabled telemedicine application and verified that I am speaking with the correct person using two identifiers. Location patient: home Location provider: work  Persons participating in the virtual visit: patient, provider  I discussed the limitations, risks, security and privacy concerns of performing an evaluation and management service by video and the availability of in person appointments. The patient expressed understanding and agreed to proceed.   Reason for visit: scheduled follow up.   HPI: She reports she is doing relatively well.  Trying to stay in.  No known COVID exposure.  No fever.  No cough, congestion or sob. No chest pain.  Tries to stay active.  No acid reflux reported.  On protonix.  No abdominal pain.  Bowels moving.  Does report increased itching - palms.  Using steroid cream.  Still on otezla.  Followed by Dr Nehemiah Massed - for psoriatic arthritis.     ROS: See pertinent positives and negatives per HPI.  Past Medical History:  Diagnosis Date  . Arthritis   . Colon polyps   . Disc disorder   . Dysphagia   . GERD (gastroesophageal reflux disease)   . History of uterine fibroid     Past Surgical History:  Procedure Laterality Date  . ABDOMINAL HYSTERECTOMY    . BREAST SURGERY  1999   biopsy  . carpal  2005   Tunnel right hand  . COLONOSCOPY WITH ESOPHAGOGASTRODUODENOSCOPY (EGD)    . COLONOSCOPY WITH PROPOFOL N/A 06/10/2016   Procedure: COLONOSCOPY WITH  PROPOFOL;  Surgeon: Manya Silvas, MD;  Location: [facility] ENDOSCOPY;  Service: Endoscopy;  Laterality: N/A;  . ESOPHAGOGASTRODUODENOSCOPY (EGD) WITH PROPOFOL N/A 06/10/2016   Procedure: ESOPHAGOGASTRODUODENOSCOPY (EGD) WITH PROPOFOL;  Surgeon: Manya Silvas, MD;  Location: [facility] ENDOSCOPY;  Service: Endoscopy;  Laterality: N/A;  . FOOT SURGERY  02/07/1003   right foot benign tumor  . SACROILIAC JOINT INJECTION  08/17/13 & 04/13/14   Dr. Sharlet Salina  . SIGMOIDOSCOPY    . UTERINE FIBROID SURGERY  1997    Family History  Problem Relation Age of Onset  . Breast cancer Mother        double masectomy  . Arthritis Father   . GER disease Father   . Heart disease Other        paternal uncles  . Colon cancer Neg Hx     SOCIAL HX: reviewed.    Current Outpatient Medications:  .  ALPRAZolam (XANAX) 0.25 MG tablet, Take 1 tablet (0.25 mg total) by mouth at bedtime as needed., Disp: 30 tablet, Rfl: 0 .  Apremilast (OTEZLA) 30 MG TABS, Take 1 tablet by mouth daily., Disp: , Rfl:  .  BIOTIN PO, Take by mouth., Disp: , Rfl:  .  Calcium Citrate (CITRACAL PO), Take by mouth 4 (four) times daily., Disp: , Rfl:  .  diazepam (VALIUM) 10 MG tablet, , Disp: , Rfl:  .  estradiol (ESTRACE) 0.1 MG/GM vaginal cream, Place 2 g vaginally 2 (two) times a week.,  Disp: , Rfl:  .  estradiol (ESTRACE) 1 MG tablet, Take 1 tablet (1 mg total) by mouth daily., Disp: 30 tablet, Rfl: 2 .  etodolac (LODINE) 500 MG tablet, , Disp: , Rfl:  .  fluticasone (CUTIVATE) 0.05 % cream, Apply 1 application topically as needed., Disp: , Rfl:  .  HYDROcodone-acetaminophen (NORCO) 5-325 MG per tablet, 1/2 - 1 tablet q 8 hours prn, Disp: 10 tablet, Rfl: 0 .  ibuprofen (ADVIL,MOTRIN) 200 MG tablet, Take 200 mg by mouth every 6 (six) hours as needed., Disp: , Rfl:  .  IRON PO, Take by mouth., Disp: , Rfl:  .  Multiple Vitamins-Minerals (MULTIVITAL PO), Take by mouth daily., Disp: , Rfl:  .  pantoprazole (PROTONIX) 40 MG tablet, Take 1  tablet (40 mg total) by mouth 2 (two) times daily., Disp: 180 tablet, Rfl: 1 .  triamcinolone cream (KENALOG) 0.1 %, Apply 1 application topically 2 (two) times daily., Disp: , Rfl:   EXAM:  GENERAL: alert, oriented, appears well and in no acute distress  HEENT: atraumatic, conjunttiva clear, no obvious abnormalities on inspection of external nose and ears  NECK: normal movements of the head and neck  LUNGS: on inspection no signs of respiratory distress, breathing rate appears normal, no obvious gross SOB, gasping or wheezing  CV: no obvious cyanosis  PSYCH/NEURO: pleasant and cooperative, no obvious depression or anxiety, speech and thought processing grossly intact  ASSESSMENT AND PLAN:  Discussed the following assessment and plan:  Environmental allergies  Gastroesophageal reflux disease without esophagitis  Low iron  Psoriatic arthritis (H[facility])  Stress  Vitamin D deficiency  Environmental allergies Has been followed by Dr Donneta Romberg.    GERD (gastroesophageal reflux disease) Controlled on current regimen.  Follow.   Low iron Follow cbc and ferritin.    Psoriatic arthritis ([place]) Followed by Dr Nehemiah Massed.  Has been on otezla.  Having increased itching - palms.  Has been using steroid cream.  Discussed need to contact dermatology.    Stress Increased stress.  Discussed with her today.  Overall she feels she is handling things relatively well.  Follow.    Vitamin D deficiency Taking vitamin D 2000 units per day.  Follow vitamin D level.      I discussed the assessment and treatment plan with the patient. The patient was provided an opportunity to ask questions and all were answered. The patient agreed with the plan and demonstrated an understanding of the instructions.   The patient was advised to call back or seek an in-person evaluation if the symptoms worsen or if the condition fails to improve as anticipated.    Einar Pheasant, MD

## 2018-08-02 ENCOUNTER — Encounter: Payer: Self-pay | Admitting: Internal Medicine

## 2018-08-02 DIAGNOSIS — E559 Vitamin D deficiency, unspecified: Secondary | ICD-10-CM | POA: Insufficient documentation

## 2018-08-02 NOTE — Assessment & Plan Note (Signed)
Followed by Dr Nehemiah Massed.  Has been on otezla.  Having increased itching - palms.  Has been using steroid cream.  Discussed need to contact dermatology.

## 2018-08-02 NOTE — Assessment & Plan Note (Signed)
Follow cbc and ferritin.  

## 2018-08-02 NOTE — Assessment & Plan Note (Signed)
Increased stress.  Discussed with her today.  Overall she feels she is handling things relatively well.  Follow.   

## 2018-08-02 NOTE — Assessment & Plan Note (Signed)
Has been followed by Dr Donneta Romberg.

## 2018-08-02 NOTE — Assessment & Plan Note (Signed)
Taking vitamin D 2000 units per day.  Follow vitamin D level.

## 2018-08-02 NOTE — Assessment & Plan Note (Signed)
Controlled on current regimen.  Follow.  

## 2018-09-10 ENCOUNTER — Other Ambulatory Visit: Payer: Self-pay | Admitting: Internal Medicine

## 2018-12-01 ENCOUNTER — Encounter: Payer: Self-pay | Admitting: Internal Medicine

## 2018-12-18 ENCOUNTER — Ambulatory Visit (INDEPENDENT_AMBULATORY_CARE_PROVIDER_SITE_OTHER): Payer: BC Managed Care – PPO | Admitting: Internal Medicine

## 2018-12-18 ENCOUNTER — Other Ambulatory Visit: Payer: Self-pay

## 2018-12-18 VITALS — BP 126/70 | HR 71 | Temp 96.7°F | Resp 16 | Wt 138.0 lb

## 2018-12-18 DIAGNOSIS — Z13220 Encounter for screening for lipoid disorders: Secondary | ICD-10-CM

## 2018-12-18 DIAGNOSIS — L4050 Arthropathic psoriasis, unspecified: Secondary | ICD-10-CM

## 2018-12-18 DIAGNOSIS — Z23 Encounter for immunization: Secondary | ICD-10-CM | POA: Diagnosis not present

## 2018-12-18 DIAGNOSIS — Z9109 Other allergy status, other than to drugs and biological substances: Secondary | ICD-10-CM | POA: Diagnosis not present

## 2018-12-18 DIAGNOSIS — K219 Gastro-esophageal reflux disease without esophagitis: Secondary | ICD-10-CM | POA: Diagnosis not present

## 2018-12-18 DIAGNOSIS — Z8619 Personal history of other infectious and parasitic diseases: Secondary | ICD-10-CM

## 2018-12-18 DIAGNOSIS — Z0000 Encounter for general adult medical examination without abnormal findings: Secondary | ICD-10-CM

## 2018-12-18 DIAGNOSIS — F439 Reaction to severe stress, unspecified: Secondary | ICD-10-CM

## 2018-12-18 DIAGNOSIS — E559 Vitamin D deficiency, unspecified: Secondary | ICD-10-CM

## 2018-12-18 NOTE — Progress Notes (Signed)
Patient ID: April Santos, female   DOB: January 02, 1956, 63 y.o.   MRN: [id number]   Subjective:    Patient ID: April Santos, female    DOB: 03-12-1956, 63 y.o.   MRN: [id number]  HPI  Patient here for her physical exam.  Sees gyn.  Allergies controlled.  Sees Dr Donneta Romberg.  Trying to stay in due to covid restrictions.  No chest pain. No sob. No acid reflux.  No abdominal pain.  Bowels moving.  Handling stress.  Followed by dermatology for psoriatic arthritis.  Skin overall is better.  Hands itch. Plans to discuss with dermatology.     Past Medical History:  Diagnosis Date  . Arthritis   . Colon polyps   . Disc disorder   . Dysphagia   . GERD (gastroesophageal reflux disease)   . History of uterine fibroid    Past Surgical History:  Procedure Laterality Date  . ABDOMINAL HYSTERECTOMY    . BREAST SURGERY  1999   biopsy  . carpal  2005   Tunnel right hand  . COLONOSCOPY WITH ESOPHAGOGASTRODUODENOSCOPY (EGD)    . COLONOSCOPY WITH PROPOFOL N/A 06/10/2016   Procedure: COLONOSCOPY WITH PROPOFOL;  Surgeon: Manya Silvas, MD;  Location: [facility] ENDOSCOPY;  Service: Endoscopy;  Laterality: N/A;  . ESOPHAGOGASTRODUODENOSCOPY (EGD) WITH PROPOFOL N/A 06/10/2016   Procedure: ESOPHAGOGASTRODUODENOSCOPY (EGD) WITH PROPOFOL;  Surgeon: Manya Silvas, MD;  Location: [facility] ENDOSCOPY;  Service: Endoscopy;  Laterality: N/A;  . FOOT SURGERY  02/07/1003   right foot benign tumor  . SACROILIAC JOINT INJECTION  08/17/13 & 04/13/14   Dr. Sharlet Salina  . SIGMOIDOSCOPY    . UTERINE FIBROID SURGERY  1997   Family History  Problem Relation Age of Onset  . Breast cancer Mother        double masectomy  . Arthritis Father   . GER disease Father   . Heart disease Other        paternal uncles  . Colon cancer Neg Hx    Social History   Socioeconomic History  . Marital status: Divorced    Spouse name: Not on file  . Number of children: 0  . Years of education: Not on file  . Highest education level: Not on file   Occupational History  . Not on file  Social Needs  . Financial resource strain: Not on file  . Food insecurity    Worry: Not on file    Inability: Not on file  . Transportation needs    Medical: Not on file    Non-medical: Not on file  Tobacco Use  . Smoking status: Never Smoker  . Smokeless tobacco: Never Used  Substance and Sexual Activity  . Alcohol use: Yes    Alcohol/week: 1.0 standard drinks    Types: 1 Glasses of wine per week    Comment: bimonthly  . Drug use: No  . Sexual activity: Not on file  Lifestyle  . Physical activity    Days per week: Not on file    Minutes per session: Not on file  . Stress: Not on file  Relationships  . Social Herbalist on phone: Not on file    Gets together: Not on file    Attends religious service: Not on file    Active member of club or organization: Not on file    Attends meetings of clubs or organizations: Not on file    Relationship status: Not on file  Other Topics Concern  .  Not on file  Social History Narrative  . Not on file    Outpatient Encounter Medications as of 12/18/2018  Medication Sig  . ALPRAZolam (XANAX) 0.25 MG tablet Take 1 tablet (0.25 mg total) by mouth at bedtime as needed.  Marland Kitchen Apremilast (OTEZLA) 30 MG TABS Take 1 tablet by mouth daily.  Marland Kitchen BIOTIN PO Take by mouth.  . Calcium Citrate (CITRACAL PO) Take by mouth 4 (four) times daily.  . diazepam (VALIUM) 10 MG tablet   . estradiol (ESTRACE) 0.1 MG/GM vaginal cream Place 2 g vaginally 2 (two) times a week.  . estradiol (ESTRACE) 1 MG tablet Take 1 tablet (1 mg total) by mouth daily.  Marland Kitchen etodolac (LODINE) 500 MG tablet   . fluticasone (CUTIVATE) 0.05 % cream Apply 1 application topically as needed.  Marland Kitchen HYDROcodone-acetaminophen (NORCO) 5-325 MG per tablet 1/2 - 1 tablet q 8 hours prn  . ibuprofen (ADVIL,MOTRIN) 200 MG tablet Take 200 mg by mouth every 6 (six) hours as needed.  . IRON PO Take by mouth.  . Multiple Vitamins-Minerals (MULTIVITAL PO)  Take by mouth daily.  . pantoprazole (PROTONIX) 40 MG tablet TAKE 1 TABLET BY MOUTH TWICE DAILY  . triamcinolone cream (KENALOG) 0.1 % Apply 1 application topically 2 (two) times daily.   No facility-administered encounter medications on file as of 12/18/2018.     Review of Systems  Constitutional: Negative for appetite change and unexpected weight change.  HENT: Negative for congestion and sinus pressure.   Eyes: Negative for pain and visual disturbance.  Respiratory: Negative for cough, chest tightness and shortness of breath.   Cardiovascular: Negative for chest pain, palpitations and leg swelling.  Gastrointestinal: Negative for abdominal pain, diarrhea, nausea and vomiting.  Genitourinary: Negative for difficulty urinating and dysuria.  Musculoskeletal: Negative for joint swelling and myalgias.  Skin: Negative for color change and rash.  Neurological: Negative for dizziness, light-headedness and headaches.  Hematological: Negative for adenopathy. Does not bruise/bleed easily.  Psychiatric/Behavioral: Negative for agitation and dysphoric mood.       Objective:    Physical Exam Constitutional:      General: She is not in acute distress.    Appearance: Normal appearance. She is well-developed.  HENT:     Right Ear: External ear normal.     Left Ear: External ear normal.  Eyes:     General: No scleral icterus.       Right eye: No discharge.        Left eye: No discharge.     Conjunctiva/sclera: Conjunctivae normal.  Neck:     Musculoskeletal: Neck supple. No muscular tenderness.     Thyroid: No thyromegaly.  Cardiovascular:     Rate and Rhythm: Normal rate and regular rhythm.  Pulmonary:     Effort: No tachypnea, accessory muscle usage or respiratory distress.     Breath sounds: Normal breath sounds. No decreased breath sounds or wheezing.  Chest:     Breasts:        Right: No inverted nipple, mass, nipple discharge or tenderness (no axillary adenopathy).        Left:  No inverted nipple, mass, nipple discharge or tenderness (no axilarry adenopathy).  Abdominal:     General: Bowel sounds are normal.     Palpations: Abdomen is soft.     Tenderness: There is no abdominal tenderness.  Musculoskeletal:        General: No swelling or tenderness.  Lymphadenopathy:     Cervical: No cervical adenopathy.  Skin:    Findings: No erythema or rash.  Neurological:     Mental Status: She is alert and oriented to person, place, and time.  Psychiatric:        Mood and Affect: Mood normal.        Behavior: Behavior normal.     BP 126/70   Pulse 71   Temp (!) 96.7 F (35.9 C)   Resp 16   Wt 138 lb (62.6 kg)   LMP 02/26/2002   SpO2 98%   BMI 25.24 kg/m  Wt Readings from Last 3 Encounters:  12/18/18 138 lb (62.6 kg)  01/19/18 138 lb (62.6 kg)  08/15/17 137 lb 3.2 oz (62.2 kg)     Lab Results  Component Value Date   WBC 4.4 06/23/2017   HGB 11.9 (L) 06/23/2017   HCT 35.6 (L) 06/23/2017   PLT 323.0 06/23/2017   GLUCOSE 93 06/23/2017   CHOL 156 06/23/2017   TRIG 113.0 06/23/2017   HDL 62.10 06/23/2017   LDLCALC 71 06/23/2017   ALT 10 06/23/2017   AST 14 06/23/2017   NA 140 06/23/2017   K 4.1 06/23/2017   CL 103 06/23/2017   CREATININE 0.73 06/23/2017   BUN 12 06/23/2017   CO2 30 06/23/2017   TSH 2.74 06/23/2017       Assessment & Plan:   Problem List Items Addressed This Visit    Environmental allergies    Followed by Dr Donneta Romberg.       GERD (gastroesophageal reflux disease)    Controlled on current regimen.  Follow.        Health care maintenance    Physical today 12/18/18.  Mammogram 10/27/17 - birads I.  Schedule f/u mammogram.  06/10/16 - colonoscopy - diverticulosis, internal hemorrhoids.   Recommended f/u colonoscopy in 10 years.        History of condyloma acuminatum    Followed by gyn.        Psoriatic arthritis ([place])    On otezla.  Followed by Dr Nehemiah Massed.        Relevant Orders   CBC with Differential/Platelet    Hepatic function panel   TSH   Basic metabolic panel   Stress    Increased stress as outlined.  Discussed with her today.  Overall she feels she is doing well.        Vitamin D deficiency    Follow vitamin D level.        Relevant Orders   VITAMIN D 25 Hydroxy (Vit-D Deficiency, Fractures)    Other Visit Diagnoses    Routine general medical examination at a health care facility    -  Primary   Need for immunization against influenza       Relevant Orders   Flu Vaccine QUAD 36+ mos IM (Completed)   Screening cholesterol level       Relevant Orders   Lipid panel       Einar Pheasant, MD

## 2018-12-20 ENCOUNTER — Encounter: Payer: Self-pay | Admitting: Internal Medicine

## 2018-12-20 NOTE — Assessment & Plan Note (Signed)
Increased stress as outlined.  Discussed with her today.  Overall she feels she is doing well.

## 2018-12-20 NOTE — Assessment & Plan Note (Signed)
Follow vitamin D level.  

## 2018-12-20 NOTE — Assessment & Plan Note (Signed)
Controlled on current regimen.  Follow.  

## 2018-12-20 NOTE — Assessment & Plan Note (Signed)
On otezla.  Followed by Dr Kowalski.  

## 2018-12-20 NOTE — Assessment & Plan Note (Signed)
Followed by gyn

## 2018-12-20 NOTE — Assessment & Plan Note (Signed)
Physical today 12/18/18.  Mammogram 10/27/17 - birads I.  Schedule f/u mammogram.  06/10/16 - colonoscopy - diverticulosis, internal hemorrhoids.   Recommended f/u colonoscopy in 10 years.

## 2018-12-20 NOTE — Assessment & Plan Note (Signed)
Followed by Dr Donneta Romberg.

## 2019-01-08 ENCOUNTER — Encounter: Payer: Self-pay | Admitting: Internal Medicine

## 2019-01-08 ENCOUNTER — Other Ambulatory Visit: Payer: Self-pay

## 2019-01-08 ENCOUNTER — Other Ambulatory Visit (INDEPENDENT_AMBULATORY_CARE_PROVIDER_SITE_OTHER): Payer: BC Managed Care – PPO

## 2019-01-08 DIAGNOSIS — E559 Vitamin D deficiency, unspecified: Secondary | ICD-10-CM | POA: Diagnosis not present

## 2019-01-08 DIAGNOSIS — L4050 Arthropathic psoriasis, unspecified: Secondary | ICD-10-CM | POA: Diagnosis not present

## 2019-01-08 DIAGNOSIS — Z13220 Encounter for screening for lipoid disorders: Secondary | ICD-10-CM

## 2019-01-08 LAB — CBC WITH DIFFERENTIAL/PLATELET
Basophils Absolute: 0 10*3/uL (ref 0.0–0.1)
Basophils Relative: 0.2 % (ref 0.0–3.0)
Eosinophils Absolute: 0 10*3/uL (ref 0.0–0.7)
Eosinophils Relative: 0.8 % (ref 0.0–5.0)
HCT: 37.8 % (ref 36.0–46.0)
Hemoglobin: 12.5 g/dL (ref 12.0–15.0)
Lymphocytes Relative: 31.1 % (ref 12.0–46.0)
Lymphs Abs: 1.6 10*3/uL (ref 0.7–4.0)
MCHC: 33 g/dL (ref 30.0–36.0)
MCV: 83.6 fl (ref 78.0–100.0)
Monocytes Absolute: 0.5 10*3/uL (ref 0.1–1.0)
Monocytes Relative: 10.8 % (ref 3.0–12.0)
Neutro Abs: 2.9 10*3/uL (ref 1.4–7.7)
Neutrophils Relative %: 57.1 % (ref 43.0–77.0)
Platelets: 297 10*3/uL (ref 150.0–400.0)
RBC: 4.53 Mil/uL (ref 3.87–5.11)
RDW: 14.5 % (ref 11.5–15.5)
WBC: 5 10*3/uL (ref 4.0–10.5)

## 2019-01-08 LAB — LIPID PANEL
Cholesterol: 185 mg/dL (ref 0–200)
HDL: 70.6 mg/dL (ref >39.00)
LDL Cholesterol: 95 mg/dL (ref 0–99)
NonHDL: 114.79
Total CHOL/HDL Ratio: 3
Triglycerides: 99 mg/dL (ref 0.0–149.0)
VLDL: 19.8 mg/dL (ref 0.0–40.0)

## 2019-01-08 LAB — BASIC METABOLIC PANEL
BUN: 15 mg/dL (ref 6–23)
CO2: 30 mEq/L (ref 19–32)
Calcium: 9.2 mg/dL (ref 8.4–10.5)
Chloride: 102 mEq/L (ref 96–112)
Creatinine, Ser: 0.82 mg/dL (ref 0.40–1.20)
GFR: 70.05 mL/min (ref >60.00)
Glucose, Bld: 93 mg/dL (ref 70–99)
Potassium: 3.7 mEq/L (ref 3.5–5.1)
Sodium: 140 mEq/L (ref 135–145)

## 2019-01-08 LAB — HEPATIC FUNCTION PANEL
ALT: 13 U/L (ref 0–35)
AST: 16 U/L (ref 0–37)
Albumin: 4.1 g/dL (ref 3.5–5.2)
Alkaline Phosphatase: 78 U/L (ref 39–117)
Bilirubin, Direct: 0.1 mg/dL (ref 0.0–0.3)
Total Bilirubin: 0.5 mg/dL (ref 0.2–1.2)
Total Protein: 7.1 g/dL (ref 6.0–8.3)

## 2019-01-08 LAB — VITAMIN D 25 HYDROXY (VIT D DEFICIENCY, FRACTURES): VITD: 42.61 ng/mL (ref 30.00–100.00)

## 2019-01-08 LAB — TSH: TSH: 2.01 u[IU]/mL (ref 0.35–4.50)

## 2019-01-09 ENCOUNTER — Encounter: Payer: Self-pay | Admitting: Internal Medicine

## 2019-06-13 ENCOUNTER — Encounter: Payer: Self-pay | Admitting: Internal Medicine

## 2019-06-14 NOTE — Telephone Encounter (Signed)
I am ok with refilling x 1.  Just need a little more information.  Please confirm pt doing ok.  Does she need earlier appt?

## 2019-06-14 NOTE — Telephone Encounter (Signed)
Pt confirmed doing ok. Screened to come in office. Moved appt up per pt request. Would like to go ahead and have xanax refilled. Appt has been moved up to 3/9 at 8:00

## 2019-06-14 NOTE — Telephone Encounter (Signed)
LMTCB

## 2019-06-15 ENCOUNTER — Encounter: Payer: Self-pay | Admitting: Internal Medicine

## 2019-06-15 ENCOUNTER — Ambulatory Visit: Payer: Medicare PPO | Admitting: Internal Medicine

## 2019-06-15 ENCOUNTER — Other Ambulatory Visit: Payer: Self-pay

## 2019-06-15 DIAGNOSIS — F439 Reaction to severe stress, unspecified: Secondary | ICD-10-CM

## 2019-06-15 DIAGNOSIS — K219 Gastro-esophageal reflux disease without esophagitis: Secondary | ICD-10-CM | POA: Diagnosis not present

## 2019-06-15 DIAGNOSIS — L4050 Arthropathic psoriasis, unspecified: Secondary | ICD-10-CM

## 2019-06-15 MED ORDER — ALPRAZOLAM 0.25 MG PO TABS: 0.2500 mg | ORAL_TABLET | Freq: Every evening | ORAL | 0 refills | Status: DC | PRN

## 2019-06-15 NOTE — Progress Notes (Signed)
Patient ID: April Santos, female   DOB: 1955-10-18, 64 y.o.   MRN: [id number]   Subjective:    Patient ID: April Santos, female    DOB: 1955/10/30, 64 y.o.   MRN: [id number]  HPI  Patient here for a scheduled follow up.  Was worked in for increased stress.  Brother recently passed away.   increased stress related to this.  Question of suspicious circumstances surrounding his death.  Autopsy being performed.  He was in [place].  Discussed with her.  She is agreeable for psych referral. Has good support from her partner and her family.   Previous right thumb pain.  Saw ortho. S/p injection.  Followed by Dr Donneta Romberg for allergies.  Also followed by dermatology for psoriatic arthritis.  On otezla.  Has been taking an increased amount of ibuprofen.  Has noticed increased acid reflux.  Has noticed acid - up in her mouth.  Taking protonix.  Noticed despite taking this medication.  Discussed referral to GI.  No abdominal pain reported.  Bowels stable.  No chest pain or increased sob reported. Request refill xanax to have if needed.     Past Medical History:  Diagnosis Date  . Arthritis   . Colon polyps   . Disc disorder   . Dysphagia   . GERD (gastroesophageal reflux disease)   . History of uterine fibroid    Past Surgical History:  Procedure Laterality Date  . ABDOMINAL HYSTERECTOMY    . BREAST SURGERY  1999   biopsy  . carpal  2005   Tunnel right hand  . COLONOSCOPY WITH ESOPHAGOGASTRODUODENOSCOPY (EGD)    . COLONOSCOPY WITH PROPOFOL N/A 06/10/2016   Procedure: COLONOSCOPY WITH PROPOFOL;  Surgeon: Manya Silvas, MD;  Location: [facility] ENDOSCOPY;  Service: Endoscopy;  Laterality: N/A;  . ESOPHAGOGASTRODUODENOSCOPY (EGD) WITH PROPOFOL N/A 06/10/2016   Procedure: ESOPHAGOGASTRODUODENOSCOPY (EGD) WITH PROPOFOL;  Surgeon: Manya Silvas, MD;  Location: [facility] ENDOSCOPY;  Service: Endoscopy;  Laterality: N/A;  . FOOT SURGERY  02/07/1003   right foot benign tumor  . SACROILIAC JOINT INJECTION   08/17/13 & 04/13/14   Dr. Sharlet Salina  . SIGMOIDOSCOPY    . UTERINE FIBROID SURGERY  1997   Family History  Problem Relation Age of Onset  . Breast cancer Mother        double masectomy  . Arthritis Father   . GER disease Father   . Heart disease Other        paternal uncles  . Colon cancer Neg Hx    Social History   Socioeconomic History  . Marital status: Divorced    Spouse name: Not on file  . Number of children: 0  . Years of education: Not on file  . Highest education level: Not on file  Occupational History  . Not on file  Tobacco Use  . Smoking status: Never Smoker  . Smokeless tobacco: Never Used  Substance and Sexual Activity  . Alcohol use: Yes    Alcohol/week: 1.0 standard drinks    Types: 1 Glasses of wine per week    Comment: bimonthly  . Drug use: No  . Sexual activity: Not on file  Other Topics Concern  . Not on file  Social History Narrative  . Not on file   Social Determinants of Health   Financial Resource Strain:   . Difficulty of Paying Living Expenses:   Food Insecurity:   . Worried About Charity fundraiser in the Last Year:   .  Ran Out of Food in the Last Year:   Transportation Needs:   . Film/video editor (Medical):   Marland Kitchen Lack of Transportation (Non-Medical):   Physical Activity:   . Days of Exercise per Week:   . Minutes of Exercise per Session:   Stress:   . Feeling of Stress :   Social Connections:   . Frequency of Communication with Friends and Family:   . Frequency of Social Gatherings with Friends and Family:   . Attends Religious Services:   . Active Member of Clubs or Organizations:   . Attends Archivist Meetings:   Marland Kitchen Marital Status:     Outpatient Encounter Medications as of 06/15/2019  Medication Sig  . ALPRAZolam (XANAX) 0.25 MG tablet Take 1 tablet (0.25 mg total) by mouth at bedtime as needed.  Marland Kitchen Apremilast (OTEZLA) 30 MG TABS Take 1 tablet by mouth daily.  Marland Kitchen BIOTIN PO Take by mouth.  . Calcium Citrate  (CITRACAL PO) Take by mouth 4 (four) times daily.  . diazepam (VALIUM) 10 MG tablet   . estradiol (ESTRACE) 0.1 MG/GM vaginal cream Place 2 g vaginally 2 (two) times a week.  . estradiol (ESTRACE) 1 MG tablet Take 1 tablet (1 mg total) by mouth daily.  Marland Kitchen etodolac (LODINE) 500 MG tablet   . fluticasone (CUTIVATE) 0.05 % cream Apply 1 application topically as needed.  Marland Kitchen HYDROcodone-acetaminophen (NORCO) 5-325 MG per tablet 1/2 - 1 tablet q 8 hours prn  . ibuprofen (ADVIL,MOTRIN) 200 MG tablet Take 200 mg by mouth every 6 (six) hours as needed.  . IRON PO Take by mouth.  . Multiple Vitamins-Minerals (MULTIVITAL PO) Take by mouth daily.  . pantoprazole (PROTONIX) 40 MG tablet TAKE 1 TABLET BY MOUTH TWICE DAILY  . triamcinolone cream (KENALOG) 0.1 % Apply 1 application topically 2 (two) times daily.   No facility-administered encounter medications on file as of 06/15/2019.   Review of Systems  Constitutional: Negative for appetite change and unexpected weight change.  HENT: Negative for congestion and sinus pressure.   Respiratory: Negative for cough, chest tightness and shortness of breath.   Cardiovascular: Negative for chest pain, palpitations and leg swelling.  Gastrointestinal: Negative for abdominal pain, diarrhea, nausea and vomiting.       Acid reflux and regurgitation as outlined.    Genitourinary: Negative for difficulty urinating and dysuria.  Musculoskeletal: Negative for joint swelling and myalgias.  Skin: Negative for color change and rash.  Neurological: Negative for dizziness, light-headedness and headaches.  Psychiatric/Behavioral: Negative for agitation and dysphoric mood.       Increased stress as outlined.         Objective:    Physical Exam Constitutional:      General: She is not in acute distress.    Appearance: Normal appearance.  HENT:     Head: Normocephalic and atraumatic.     Right Ear: External ear normal.     Left Ear: External ear normal.  Eyes:      General: No scleral icterus.       Right eye: No discharge.        Left eye: No discharge.     Conjunctiva/sclera: Conjunctivae normal.  Neck:     Thyroid: No thyromegaly.  Cardiovascular:     Rate and Rhythm: Normal rate and regular rhythm.  Pulmonary:     Effort: No respiratory distress.     Breath sounds: Normal breath sounds. No wheezing.  Abdominal:     General: Bowel  sounds are normal.     Palpations: Abdomen is soft.     Tenderness: There is no abdominal tenderness.  Musculoskeletal:        General: No swelling or tenderness.     Cervical back: Neck supple. No tenderness.  Lymphadenopathy:     Cervical: No cervical adenopathy.  Skin:    Findings: No erythema or rash.  Neurological:     Mental Status: She is alert and oriented to person, place, and time.  Psychiatric:        Mood and Affect: Mood normal.        Behavior: Behavior normal.     BP 126/70   Pulse 76   Temp (!) 97 F (36.1 C)   Resp 16   Wt 145 lb 9.6 oz (66 kg)   LMP 02/26/2002   SpO2 99%   BMI 26.63 kg/m  Wt Readings from Last 3 Encounters:  06/15/19 145 lb 9.6 oz (66 kg)  12/18/18 138 lb (62.6 kg)  01/19/18 138 lb (62.6 kg)     Lab Results  Component Value Date   WBC 5.0 01/08/2019   HGB 12.5 01/08/2019   HCT 37.8 01/08/2019   PLT 297.0 01/08/2019   GLUCOSE 93 01/08/2019   CHOL 185 01/08/2019   TRIG 99.0 01/08/2019   HDL 70.60 01/08/2019   LDLCALC 95 01/08/2019   ALT 13 01/08/2019   AST 16 01/08/2019   NA 140 01/08/2019   K 3.7 01/08/2019   CL 102 01/08/2019   CREATININE 0.82 01/08/2019   BUN 15 01/08/2019   CO2 30 01/08/2019   TSH 2.01 01/08/2019       Assessment & Plan:   Problem List Items Addressed This Visit    GERD (gastroesophageal reflux disease)    On protonix.  Increased acid reflux despite taking q day.  Increased to bid.  Refer to GI for further evaluation given persistent symptoms despite being on medication.  Stop ibuprofen.        Relevant Orders    Ambulatory referral to Gastroenterology   Psoriatic arthritis ([place])    On otezla.  Followed by Dr Nehemiah Massed.  Stable.       Stress    Increased stress as outlined.  Discussed with her today.  Has good support.  Discussed psychiatry referral.  She is in agreement.  Request refill for xanax.  Rarely uses.        Relevant Orders   Ambulatory referral to Psychiatry       Einar Pheasant, MD

## 2019-06-15 NOTE — Telephone Encounter (Signed)
rx sent in for xanax #30 with no refills.   

## 2019-06-15 NOTE — Patient Instructions (Signed)
Add pepcid 20mg  - before bed

## 2019-06-20 ENCOUNTER — Encounter: Payer: Self-pay | Admitting: Internal Medicine

## 2019-06-20 NOTE — Assessment & Plan Note (Signed)
On otezla.  Followed by Dr Nehemiah Massed.  Stable.

## 2019-06-20 NOTE — Assessment & Plan Note (Signed)
Increased stress as outlined.  Discussed with her today.  Has good support.  Discussed psychiatry referral.  She is in agreement.  Request refill for xanax.  Rarely uses.

## 2019-06-20 NOTE — Assessment & Plan Note (Addendum)
On protonix.  Increased acid reflux despite taking q day.  Increased to bid.  Refer to GI for further evaluation given persistent symptoms despite being on medication.  Stop ibuprofen.

## 2019-06-24 ENCOUNTER — Telehealth: Payer: Self-pay | Admitting: Internal Medicine

## 2019-06-24 NOTE — Telephone Encounter (Signed)
I called pt and left vm to call ofc regarding referral.

## 2019-06-25 ENCOUNTER — Ambulatory Visit: Payer: BC Managed Care – PPO | Admitting: Internal Medicine

## 2019-07-07 ENCOUNTER — Telehealth: Payer: Self-pay | Admitting: Internal Medicine

## 2019-07-07 NOTE — Telephone Encounter (Signed)
I called pt and left vm to call ofc.

## 2019-07-15 ENCOUNTER — Ambulatory Visit: Payer: BC Managed Care – PPO | Admitting: Dermatology

## 2019-07-16 ENCOUNTER — Ambulatory Visit: Payer: Medicare PPO | Admitting: Internal Medicine

## 2019-07-20 ENCOUNTER — Other Ambulatory Visit: Payer: Self-pay | Admitting: Dermatology

## 2019-07-26 ENCOUNTER — Ambulatory Visit: Payer: Medicare PPO | Admitting: Internal Medicine

## 2019-07-26 ENCOUNTER — Encounter: Payer: Self-pay | Admitting: Internal Medicine

## 2019-07-26 ENCOUNTER — Other Ambulatory Visit: Payer: Self-pay

## 2019-07-26 DIAGNOSIS — Z9109 Other allergy status, other than to drugs and biological substances: Secondary | ICD-10-CM

## 2019-07-26 DIAGNOSIS — K219 Gastro-esophageal reflux disease without esophagitis: Secondary | ICD-10-CM

## 2019-07-26 DIAGNOSIS — L4050 Arthropathic psoriasis, unspecified: Secondary | ICD-10-CM

## 2019-07-26 DIAGNOSIS — F439 Reaction to severe stress, unspecified: Secondary | ICD-10-CM

## 2019-07-26 DIAGNOSIS — E559 Vitamin D deficiency, unspecified: Secondary | ICD-10-CM

## 2019-07-26 DIAGNOSIS — R1010 Upper abdominal pain, unspecified: Secondary | ICD-10-CM

## 2019-07-26 MED ORDER — FAMOTIDINE 40 MG PO TABS: ORAL_TABLET | ORAL | 2 refills | Status: DC

## 2019-07-26 MED ORDER — PANTOPRAZOLE SODIUM 40 MG PO TBEC: 40.0000 mg | DELAYED_RELEASE_TABLET | Freq: Every day | ORAL | 0 refills | Status: DC

## 2019-07-26 NOTE — Progress Notes (Signed)
Patient ID: April Santos, female   DOB: Aug 06, 1955, 64 y.o.   MRN: [id number]   Subjective:    Patient ID: April Santos, female    DOB: [birth year], 64 y.o.   MRN: [id number]  HPI This visit occurred during the SARS-CoV-2 public health emergency.  Safety protocols were in place, including screening questions prior to the visit, additional usage of staff PPE, and extensive cleaning of exam room while observing appropriate contact time as indicated for disinfecting solutions.  Patient here for a scheduled follow up.  Increased stress recently as outlined in last note.  Brother recently passed.  Increased stress regarding his death.  Tries to stay active.  Has noticed some episodes of abdominal/chest discomfort.  Feels like upper abdomen/chest - constricting.  May last 30 minutes.  Will notice some burning/liquid - 1x/week.  Taking protonix.  Last visit discussed increasing to bid.  Has appt planned with GI - Dr Marius Ditch in May.  Trying to stick with a bland diet.  Avoiding tomatoes.  No chest pain with increased activity or exertion.  No sob reported.    Past Medical History:  Diagnosis Date  . Arthritis   . Colon polyps   . Disc disorder   . Dysphagia   . GERD (gastroesophageal reflux disease)   . History of uterine fibroid   . Psoriasis    Past Surgical History:  Procedure Laterality Date  . ABDOMINAL HYSTERECTOMY    . BREAST SURGERY  1999   biopsy  . carpal  2005   Tunnel right hand  . COLONOSCOPY WITH ESOPHAGOGASTRODUODENOSCOPY (EGD)    . COLONOSCOPY WITH PROPOFOL N/A 06/10/2016   Procedure: COLONOSCOPY WITH PROPOFOL;  Surgeon: Manya Silvas, MD;  Location: [facility] ENDOSCOPY;  Service: Endoscopy;  Laterality: N/A;  . ESOPHAGOGASTRODUODENOSCOPY (EGD) WITH PROPOFOL N/A 06/10/2016   Procedure: ESOPHAGOGASTRODUODENOSCOPY (EGD) WITH PROPOFOL;  Surgeon: Manya Silvas, MD;  Location: [facility] ENDOSCOPY;  Service: Endoscopy;  Laterality: N/A;  . FOOT SURGERY  02/07/1003   right foot benign tumor   . SACROILIAC JOINT INJECTION  08/17/13 & 04/13/14   Dr. Sharlet Salina  . SIGMOIDOSCOPY    . UTERINE FIBROID SURGERY  1997   Family History  Problem Relation Age of Onset  . Breast cancer Mother        double masectomy  . Arthritis Father   . GER disease Father   . Heart disease Other        paternal uncles  . Colon cancer Neg Hx    Social History   Socioeconomic History  . Marital status: Divorced    Spouse name: Not on file  . Number of children: 0  . Years of education: Not on file  . Highest education level: Not on file  Occupational History  . Not on file  Tobacco Use  . Smoking status: Never Smoker  . Smokeless tobacco: Never Used  Substance and Sexual Activity  . Alcohol use: Yes    Alcohol/week: 1.0 standard drinks    Types: 1 Glasses of wine per week    Comment: bimonthly  . Drug use: No  . Sexual activity: Not on file  Other Topics Concern  . Not on file  Social History Narrative  . Not on file   Social Determinants of Health   Financial Resource Strain:   . Difficulty of Paying Living Expenses:   Food Insecurity:   . Worried About Charity fundraiser in the Last Year:   . [employer]  in the Last Year:   Transportation Needs:   . Film/video editor (Medical):   Marland Kitchen Lack of Transportation (Non-Medical):   Physical Activity:   . Days of Exercise per Week:   . Minutes of Exercise per Session:   Stress:   . Feeling of Stress :   Social Connections:   . Frequency of Communication with Friends and Family:   . Frequency of Social Gatherings with Friends and Family:   . Attends Religious Services:   . Active Member of Clubs or Organizations:   . Attends Archivist Meetings:   Marland Kitchen Marital Status:     Outpatient Encounter Medications as of 07/26/2019  Medication Sig  . ALPRAZolam (XANAX) 0.25 MG tablet Take 1 tablet (0.25 mg total) by mouth at bedtime as needed.  Marland Kitchen Apremilast (OTEZLA) 30 MG TABS Take 1 tablet by mouth daily.  Marland Kitchen BIOTIN PO Take  by mouth.  . Calcium Citrate (CITRACAL PO) Take by mouth 4 (four) times daily.  [name] (EUCRISA) 2 % OINT   . diazepam (VALIUM) 10 MG tablet USED FOR DENTAL PROCEDURES  . estradiol (ESTRACE) 0.1 MG/GM vaginal cream Place 2 g vaginally 2 (two) times a week.  . estradiol (ESTRACE) 1 MG tablet Take 1 tablet (1 mg total) by mouth daily.  Marland Kitchen etodolac (LODINE) 500 MG tablet   . famotidine (PEPCID) 40 MG tablet Take one tablet 30 minutes before evening meal  . fluticasone (CUTIVATE) 0.05 % cream Apply 1 application topically as needed.  Marland Kitchen HYDROcodone-acetaminophen (NORCO) 5-325 MG per tablet 1/2 - 1 tablet q 8 hours prn  . ibuprofen (ADVIL,MOTRIN) 200 MG tablet Take 200 mg by mouth every 6 (six) hours as needed.  . IRON PO Take by mouth.  . montelukast (SINGULAIR) 10 MG tablet TAKE ONE TABLET BY MOUTH EVERY DAY  . Multiple Vitamins-Minerals (MULTIVITAL PO) Take by mouth daily.  . pantoprazole (PROTONIX) 40 MG tablet Take 1 tablet (40 mg total) by mouth daily.  Marland Kitchen triamcinolone cream (KENALOG) 0.1 % Apply 1 application topically 2 (two) times daily.  . [DISCONTINUED] pantoprazole (PROTONIX) 40 MG tablet TAKE 1 TABLET BY MOUTH TWICE DAILY   No facility-administered encounter medications on file as of 07/26/2019.    Review of Systems  Constitutional: Negative for appetite change and unexpected weight change.  HENT: Negative for congestion and sinus pressure.   Respiratory: Negative for cough, chest tightness and shortness of breath.   Cardiovascular: Negative for chest pain, palpitations and leg swelling.  Gastrointestinal: Negative for diarrhea and vomiting.       Increased acid reflux as outlined.  Chest/abdominal discomfort as outlined.    Genitourinary: Negative for difficulty urinating and dysuria.  Musculoskeletal: Negative for joint swelling and myalgias.  Skin: Negative for color change and rash.  Neurological: Negative for dizziness, light-headedness and headaches.   Psychiatric/Behavioral: Negative for agitation and dysphoric mood.       Increased stress as outlined.         Objective:    Physical Exam Constitutional:      General: She is not in acute distress.    Appearance: Normal appearance.  HENT:     Head: Normocephalic and atraumatic.     Right Ear: External ear normal.     Left Ear: External ear normal.  Eyes:     General:        Right eye: No discharge.        Left eye: No discharge.     Conjunctiva/sclera: Conjunctivae  normal.  Neck:     Thyroid: No thyromegaly.  Cardiovascular:     Rate and Rhythm: Normal rate and regular rhythm.  Pulmonary:     Effort: No respiratory distress.     Breath sounds: Normal breath sounds. No wheezing.  Abdominal:     General: Bowel sounds are normal.     Palpations: Abdomen is soft.     Tenderness: There is no abdominal tenderness.  Musculoskeletal:        General: No swelling or tenderness.     Cervical back: Neck supple. No tenderness.  Lymphadenopathy:     Cervical: No cervical adenopathy.  Skin:    Findings: No erythema or rash.  Neurological:     Mental Status: She is alert.  Psychiatric:        Mood and Affect: Mood normal.        Behavior: Behavior normal.     BP 116/70   Pulse 74   Temp (!) 97.1 F (36.2 C)   Resp 16   Wt 144 lb 9.6 oz (65.6 kg)   LMP 02/26/2002   SpO2 98%   BMI 26.45 kg/m  Wt Readings from Last 3 Encounters:  07/26/19 144 lb 9.6 oz (65.6 kg)  06/15/19 145 lb 9.6 oz (66 kg)  12/18/18 138 lb (62.6 kg)     Lab Results  Component Value Date   WBC 5.0 01/08/2019   HGB 12.5 01/08/2019   HCT 37.8 01/08/2019   PLT 297.0 01/08/2019   GLUCOSE 93 01/08/2019   CHOL 185 01/08/2019   TRIG 99.0 01/08/2019   HDL 70.60 01/08/2019   LDLCALC 95 01/08/2019   ALT 13 01/08/2019   AST 16 01/08/2019   NA 140 01/08/2019   K 3.7 01/08/2019   CL 102 01/08/2019   CREATININE 0.82 01/08/2019   BUN 15 01/08/2019   CO2 30 01/08/2019   TSH 2.01 01/08/2019     No results found.     Assessment & Plan:   Problem List Items Addressed This Visit    Abdominal pain    Chest/abdominal discomfort as outlined.  Acid reflux as outlined.  Check abdominal ultrasound.  Continue protonix. Keep f/u planned with GI.       Relevant Orders   US Abdomen Complete   Environmental allergies    Followed by Dr Donneta Romberg.       GERD (gastroesophageal reflux disease)    Last visit protonix was increased to bid secondary to increased acid reflux.  Still has some issues - maybe one time per week. Discomfort as outlined.  Check abdominal ultrasound.  Has appt with GI in May.  Trying to eat bland diet and avoid tomatoes, etc.        Relevant Medications   pantoprazole (PROTONIX) 40 MG tablet   famotidine (PEPCID) 40 MG tablet   Psoriatic arthritis ([place])    On otezla.  Followed by Dr Nehemiah Massed.        Stress    Increased stress as outlined.  Has good support.  Follow.        Vitamin D deficiency    Follow vitamin D.            Einar Pheasant, MD

## 2019-08-01 ENCOUNTER — Encounter: Payer: Self-pay | Admitting: Internal Medicine

## 2019-08-01 DIAGNOSIS — R109 Unspecified abdominal pain: Secondary | ICD-10-CM | POA: Insufficient documentation

## 2019-08-01 NOTE — Assessment & Plan Note (Signed)
Follow vitamin D.

## 2019-08-01 NOTE — Assessment & Plan Note (Signed)
On otezla.  Followed by Dr Kowalski.  

## 2019-08-01 NOTE — Assessment & Plan Note (Signed)
Last visit protonix was increased to bid secondary to increased acid reflux.  Still has some issues - maybe one time per week. Discomfort as outlined.  Check abdominal ultrasound.  Has appt with GI in May.  Trying to eat bland diet and avoid tomatoes, etc.

## 2019-08-01 NOTE — Assessment & Plan Note (Signed)
Chest/abdominal discomfort as outlined.  Acid reflux as outlined.  Check abdominal ultrasound.  Continue protonix. Keep f/u planned with GI.

## 2019-08-01 NOTE — Assessment & Plan Note (Signed)
Followed by Dr Donneta Romberg.

## 2019-08-01 NOTE — Assessment & Plan Note (Signed)
Increased stress as outlined.  Has good support.  Follow.

## 2019-08-03 ENCOUNTER — Telehealth: Payer: Self-pay | Admitting: Internal Medicine

## 2019-08-03 NOTE — Telephone Encounter (Signed)
I left pt a vm to call ofc to sch US abdomen.

## 2019-08-04 ENCOUNTER — Telehealth: Payer: Self-pay | Admitting: Internal Medicine

## 2019-08-04 NOTE — Telephone Encounter (Signed)
Pt returned your call.  

## 2019-08-04 NOTE — Telephone Encounter (Signed)
I left pt vm to call ofc to sch [place].

## 2019-08-04 NOTE — Telephone Encounter (Signed)
k

## 2019-08-11 ENCOUNTER — Other Ambulatory Visit: Payer: Self-pay

## 2019-08-11 ENCOUNTER — Ambulatory Visit
Admission: RE | Admit: 2019-08-11 | Discharge: 2019-08-11 | Disposition: A | Discharge status: Home / Self Care | Payer: Medicare PPO | Source: Ambulatory Visit | Attending: Internal Medicine | Admitting: Internal Medicine

## 2019-08-11 DIAGNOSIS — K8020 Calculus of gallbladder without cholecystitis without obstruction: Secondary | ICD-10-CM | POA: Diagnosis not present

## 2019-08-11 DIAGNOSIS — R1010 Upper abdominal pain, unspecified: Secondary | ICD-10-CM | POA: Diagnosis not present

## 2019-08-23 ENCOUNTER — Other Ambulatory Visit: Payer: Self-pay

## 2019-08-23 ENCOUNTER — Encounter: Payer: Self-pay | Admitting: Gastroenterology

## 2019-08-23 ENCOUNTER — Telehealth: Payer: Self-pay

## 2019-08-23 ENCOUNTER — Ambulatory Visit: Payer: Medicare PPO | Admitting: Gastroenterology

## 2019-08-23 VITALS — BP 139/86 | HR 76 | Temp 97.9°F | Ht 62.0 in | Wt 144.1 lb

## 2019-08-23 DIAGNOSIS — R1013 Epigastric pain: Secondary | ICD-10-CM

## 2019-08-23 DIAGNOSIS — K219 Gastro-esophageal reflux disease without esophagitis: Secondary | ICD-10-CM

## 2019-08-23 MED ORDER — OMEPRAZOLE 40 MG PO CPDR: 40.0000 mg | DELAYED_RELEASE_CAPSULE | Freq: Every day | ORAL | 2 refills | Status: DC

## 2019-08-23 NOTE — Progress Notes (Signed)
April Darby, MD [address]  [place]  [place], [place] [address]  Main: [phone]  Fax: [phone]    Gastroenterology Consultation  Referring Provider:     Einar Pheasant, MD Primary Care Physician:  Einar Pheasant, MD Primary Gastroenterologist:  Dr. Cephas Santos Reason for Consultation:     Indigestion        HPI:   April Santos is a 64 y.o. female referred by Dr. Einar Pheasant, MD  for consultation & management of indigestion.  Patient reports that the last 6 months, she has been experiencing waterbrash, nausea associated with nonbloody emesis, chest discomfort which she describes it as indigestion.  She is on pantoprazole 40 mg daily prescribed by Dr. Nicki Reaper.  She was on pantoprazole in the past which helped.  Currently, it is not providing any relief of her symptoms.  She has gained about 20 pounds in last 2 years.  She also reports that she lost her family members within last 1 year and she has been stressful.  She denies any nocturnal symptoms.  She underwent right upper ultrasound which revealed cholelithiasis.  She used to drink soft drinks regularly, which she cut back and noticed some improvement in her symptoms.  She does eat chocolate about 2-3 times a week.  She does not eat any red meat.  She does eat outside about 1-2 times a week.  Due to the GI symptoms, she is not able to eat out.  She denies any constipation  She does not smoke or drink alcohol  NSAIDs: None  Antiplts/Anticoagulants/Anti thrombotics: None  GI Procedures: EGD and colonoscopy by Dr. Vira Agar 06/10/2016 - Diverticulosis in the sigmoid colon. - Internal hemorrhoids. - The examination was otherwise normal. - No specimens collected.  - Mild Schatzki ring. Dilated to 16 mm. - Gastritis. Biopsied. - Normal examined duodenum.  DIAGNOSIS:  A. 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  - NEGATIVE FOR H. PYLORI, DYSPLASIA, AND MALIGNANCY.     Past Medical History:  Diagnosis Date  . Arthritis   . Colon polyps   . Disc disorder   . Dysphagia   . GERD (gastroesophageal reflux disease)   . History of uterine fibroid   . Psoriasis     Past Surgical History:  Procedure Laterality Date  . ABDOMINAL HYSTERECTOMY    . BREAST SURGERY  1999   biopsy  . carpal  2005   Tunnel right hand  . COLONOSCOPY WITH ESOPHAGOGASTRODUODENOSCOPY (EGD)    . COLONOSCOPY WITH PROPOFOL N/A 06/10/2016   Procedure: COLONOSCOPY WITH PROPOFOL;  Surgeon: Manya Silvas, MD;  Location: [facility] ENDOSCOPY;  Service: Endoscopy;  Laterality: N/A;  . ESOPHAGOGASTRODUODENOSCOPY (EGD) WITH PROPOFOL N/A 06/10/2016   Procedure: ESOPHAGOGASTRODUODENOSCOPY (EGD) WITH PROPOFOL;  Surgeon: Manya Silvas, MD;  Location: [facility] ENDOSCOPY;  Service: Endoscopy;  Laterality: N/A;  . FOOT SURGERY  02/07/1003   right foot benign tumor  . SACROILIAC JOINT INJECTION  08/17/13 & 04/13/14   Dr. Sharlet Salina  . SIGMOIDOSCOPY    . UTERINE FIBROID SURGERY  1997   Current Outpatient Medications:  .  ALPRAZolam (XANAX) 0.25 MG tablet, Take 1 tablet (0.25 mg total) by mouth at bedtime as needed., Disp: 30 tablet, Rfl: 0 .  Apremilast (OTEZLA) 30 MG TABS, Take 1 tablet by mouth daily., Disp: , Rfl:  .  Calcium Citrate (CITRACAL PO), Take by mouth 4 (four) times daily., Disp: , Rfl:  .  Crisaborole (EUCRISA) 2 %  OINT, , Disp: , Rfl:  .  estradiol (ESTRACE) 0.1 MG/GM vaginal cream, Place 2 g vaginally 2 (two) times a week., Disp: , Rfl:  .  estradiol (ESTRACE) 1 MG tablet, Take 1 tablet (1 mg total) by mouth daily., Disp: 30 tablet, Rfl: 2 .  famotidine (PEPCID) 40 MG tablet, Take one tablet 30 minutes before evening meal, Disp: 30 tablet, Rfl: 2 .  fluticasone (CUTIVATE) 0.05 % cream, Apply 1 application topically as needed., Disp: , Rfl:  .  HYDROcodone-acetaminophen (NORCO) 5-325 MG per tablet, 1/2 - 1 tablet q 8 hours prn, Disp: 10 tablet, Rfl: 0 .  ibuprofen (ADVIL,MOTRIN) 200 MG  tablet, Take 200 mg by mouth every 6 (six) hours as needed., Disp: , Rfl:  .  IRON PO, Take by mouth., Disp: , Rfl:  .  montelukast (SINGULAIR) 10 MG tablet, TAKE ONE TABLET BY MOUTH EVERY DAY, Disp: 30 tablet, Rfl: 0 .  Multiple Vitamins-Minerals (MULTIVITAL PO), Take by mouth daily., Disp: , Rfl:  .  omeprazole (PRILOSEC) 40 MG capsule, Take 1 capsule (40 mg total) by mouth daily before breakfast., Disp: 30 capsule, Rfl: 2    Family History  Problem Relation Age of Onset  . Breast cancer Mother        double masectomy  . Arthritis Father   . GER disease Father   . Heart disease Other        paternal uncles  . Colon cancer Neg Hx      Social History   Tobacco Use  . Smoking status: Never Smoker  . Smokeless tobacco: Never Used  Substance Use Topics  . Alcohol use: Yes    Alcohol/week: 1.0 standard drinks    Types: 1 Glasses of wine per week    Comment: bimonthly  . Drug use: No    Allergies as of 08/23/2019 - Review Complete 08/23/2019  Allergen Reaction Noted  . Erythromycin ethylsuccinate [erythromycin]  04/28/2012  . Omnicef [cefdinir]  04/28/2012    Review of Systems:    All systems reviewed and negative except where noted in HPI.   Physical Exam:  BP 139/86 (BP Location: Left Arm, Patient Position: Sitting, Cuff Size: Normal)   Pulse 76   Temp 97.9 F (36.6 C) (Oral)   Ht 5\' 2"  (1.575 m)   Wt 144 lb 2 oz (65.4 kg)   LMP 02/26/2002   BMI 26.36 kg/m  Patient's last menstrual period was 02/26/2002.  General:   Alert,  Well-developed, well-nourished, pleasant and cooperative in NAD Head:  Normocephalic and atraumatic. Eyes:  Sclera clear, no icterus.   Conjunctiva pink. Ears:  Normal auditory acuity. Nose:  No deformity, discharge, or lesions. Mouth:  No deformity or lesions,oropharynx pink & moist. Neck:  Supple; no masses or thyromegaly. Lungs:  Respirations even and unlabored.  Clear throughout to auscultation.   No wheezes, crackles, or rhonchi. No  acute distress. Heart:  Regular rate and rhythm; no murmurs, clicks, rubs, or gallops. Abdomen:  Normal bowel sounds. Soft, mild epigastric tenderness and non-distended without masses, hepatosplenomegaly or hernias noted.  No guarding or rebound tenderness.   Rectal: Not performed Msk:  Symmetrical without gross deformities. Good, equal movement & strength bilaterally. Pulses:  Normal pulses noted. Extremities:  No clubbing or edema.  No cyanosis. Neurologic:  Alert and oriented x3;  grossly normal neurologically. Skin:  Intact without significant lesions or rashes. No jaundice. Psych:  Alert and cooperative. Normal mood and affect.  Imaging Studies: Reviewed  Assessment and Plan:  KACHE SMYER is a 64 y.o. female with history of psoriatic arthritis is seen in consultation for 6 months history of dyspepsia/GERD  Chronic dyspepsia/GERD Switch from Protonix to omeprazole 40 mg once a day before meals Discussed about antireflux lifestyle Recommend H. pylori breath test  If above work-up negative and symptoms not relieved in next 1-2 months, will refer to general surgery to evaluate for cholecystectomy  Follow up in 2 months   April Darby, MD

## 2019-08-23 NOTE — Telephone Encounter (Signed)
Pt called requesting a refill of Otezla, I left 1 sample of Otezla for pt to come by and pick up discussed with pt,   Pt also would like to know if April Santos has an update on her [facility]

## 2019-08-23 NOTE — Patient Instructions (Signed)

## 2019-08-24 ENCOUNTER — Ambulatory Visit: Payer: Medicare PPO | Admitting: Dermatology

## 2019-08-24 LAB — H. PYLORI BREATH TEST: H pylori Breath Test: NEGATIVE

## 2019-08-24 NOTE — Telephone Encounter (Signed)
I called [facility] this morning and they needed patients updated [employer] card. Faxed to [facility].

## 2019-08-27 ENCOUNTER — Telehealth: Payer: Self-pay | Admitting: Internal Medicine

## 2019-08-27 DIAGNOSIS — R1010 Upper abdominal pain, unspecified: Secondary | ICD-10-CM

## 2019-08-27 NOTE — Telephone Encounter (Signed)
Patient called about her Ultra sound results.

## 2019-08-30 NOTE — Telephone Encounter (Signed)
Lars Masson, LPN  [id number] PM EDT    Letter mailed.   Lars Masson, LPN  [id number] AM EDT    LMTCB   Lars Masson, LPN  [id number] AM EDT    April Santos    Lars Masson, LPN  [id number] PM EDT    April Santos   Einar Pheasant, MD  08/12/2019 11:17 PM EDT    Notify pt that her ultrasound reveals a gallstone. No acute inflammation. Some changes c/w minimal fatty liver. Given her intermittent episodes of abdominal/chest pain and given presence of gallstone, I would like to refer her to surgery for evaluation and question of need for any further intervention . If agreeable, let me know and I will place the order for the referral. Also, let me know if has preference of which surgeon she wants to see.

## 2019-08-30 NOTE — Telephone Encounter (Signed)
Patient informed and verbalized understanding.   States she saw Cephas Darby, MD and that provider was not going to refer her and changed medications. She was taken off Protonix and Pepcid then put on Omeprazole. Patient states this did not help and made her constipated. She has since switched back to her old medication. Med list has been updated.   Patient states she does not have a preference of who to see. She also no longer wants to see Dr Marius Ditch and will look into getting another GI referral after seeing surgery.

## 2019-08-31 NOTE — Telephone Encounter (Signed)
Order placed for referral to surgery.  

## 2019-09-02 ENCOUNTER — Other Ambulatory Visit: Payer: Self-pay | Admitting: Dermatology

## 2019-09-07 DIAGNOSIS — K8020 Calculus of gallbladder without cholecystitis without obstruction: Secondary | ICD-10-CM | POA: Diagnosis not present

## 2019-09-08 ENCOUNTER — Telehealth: Payer: Self-pay

## 2019-09-08 DIAGNOSIS — L409 Psoriasis, unspecified: Secondary | ICD-10-CM

## 2019-09-08 DIAGNOSIS — L4050 Arthropathic psoriasis, unspecified: Secondary | ICD-10-CM

## 2019-09-08 MED ORDER — EUCRISA 2 % EX OINT: TOPICAL_OINTMENT | CUTANEOUS | 3 refills | Status: DC

## 2019-09-08 NOTE — Telephone Encounter (Signed)
Received faxed refill request from [facility] ([facility]) for patient's Eucrisa 2% ointment #[id number]

## 2019-10-05 ENCOUNTER — Other Ambulatory Visit: Payer: Self-pay

## 2019-10-05 ENCOUNTER — Ambulatory Visit (INDEPENDENT_AMBULATORY_CARE_PROVIDER_SITE_OTHER): Payer: Self-pay | Admitting: Dermatology

## 2019-10-05 DIAGNOSIS — L988 Other specified disorders of the skin and subcutaneous tissue: Secondary | ICD-10-CM

## 2019-10-05 NOTE — Progress Notes (Signed)
   Follow-Up Visit   Subjective  April Santos is a 64 y.o. female who presents for the following: Facial Elastosis.  The following portions of the chart were reviewed this encounter and updated as appropriate:  Tobacco  Allergies  Meds  Problems  Med Hx  Surg Hx  Fam Hx      Review of Systems:  No other skin or systemic complaints except as noted in HPI or Assessment and Plan.  Objective  Well appearing patient in no apparent distress; mood and affect are within normal limits.  A focused examination was performed including face. Relevant physical exam findings are noted in the Assessment and Plan.  Objective  Face: Rhytides and volume loss.   Images     Assessment & Plan    Elastosis of skin Face  Botox Injection - Face Location: See attached image  Informed consent: Discussed risks (infection, pain, bleeding, bruising, swelling, allergic reaction, paralysis of nearby muscles, eyelid droop, double vision, neck weakness, difficulty breathing, headache, undesirable cosmetic result, and need for additional treatment) and benefits of the procedure, as well as the alternatives.  Informed consent was obtained.  Preparation: The area was cleansed with alcohol.  Procedure Details:  Botox was injected into the dermis with a 30-gauge needle. Pressure applied to any bleeding. Ice packs offered for swelling.  Lot Number:  [id number] Expiration:  12/2021  Total Units Injected:  37.5  Plan: Patient was instructed to remain upright for 4 hours. Patient was instructed to avoid massaging the face and avoid vigorous exercise for the rest of the day. Tylenol may be used for headache.  Allow 2 weeks before returning to clinic for additional dosing as needed. Patient will call for any problems.   Return in about 4 weeks (around 11/02/2019) for Botox follow up and discuss fillers.  I, Ashok Cordia, CMA, am acting as scribe for Sarina Ser, MD .  Documentation: I have reviewed  the above documentation for accuracy and completeness, and I agree with the above.  Sarina Ser, MD

## 2019-10-18 ENCOUNTER — Encounter: Payer: Self-pay | Admitting: Dermatology

## 2019-10-18 ENCOUNTER — Encounter: Payer: Self-pay | Admitting: Gastroenterology

## 2019-10-20 ENCOUNTER — Ambulatory Visit: Payer: Medicare PPO | Admitting: Gastroenterology

## 2019-10-20 DIAGNOSIS — M1811 Unilateral primary osteoarthritis of first carpometacarpal joint, right hand: Secondary | ICD-10-CM | POA: Diagnosis not present

## 2019-11-08 ENCOUNTER — Encounter: Payer: Self-pay | Admitting: Dermatology

## 2019-11-09 ENCOUNTER — Ambulatory Visit: Payer: Medicare PPO | Admitting: Dermatology

## 2019-11-30 ENCOUNTER — Ambulatory Visit (INDEPENDENT_AMBULATORY_CARE_PROVIDER_SITE_OTHER): Payer: Self-pay | Admitting: Dermatology

## 2019-11-30 ENCOUNTER — Other Ambulatory Visit: Payer: Self-pay

## 2019-11-30 DIAGNOSIS — L988 Other specified disorders of the skin and subcutaneous tissue: Secondary | ICD-10-CM

## 2019-11-30 DIAGNOSIS — D692 Other nonthrombocytopenic purpura: Secondary | ICD-10-CM

## 2019-11-30 NOTE — Progress Notes (Signed)
   Follow-Up Visit   Subjective  April Santos is a 64 y.o. female who presents for the following: Facial Elastosis (Botox recheck, last treatment 10/05/2019) and check spot (L lower leg, hx of injury ~7m ago).  The following portions of the chart were reviewed this encounter and updated as appropriate:  Tobacco  Allergies  Meds  Problems  Med Hx  Surg Hx  Fam Hx     Review of Systems:  No other skin or systemic complaints except as noted in HPI or Assessment and Plan.  Objective  Well appearing patient in no apparent distress; mood and affect are within normal limits.  A focused examination was performed including face, L lower leg. Relevant physical exam findings are noted in the Assessment and Plan.  Objective  face: Rhytides and volume loss.   Images           Assessment & Plan    Purpura - Violaceous macules and patches - Benign - Related to age, sun damage and/or use of blood thinners - Observe - Can use OTC arnica containing moisturizer such as Dermend Bruise Formula if desired - Call for worsening or other concerns  Elastosis of skin face  Follow up/recheck from Botox treatmen 10/05/2019 Recommend adding 5 units to frown complex, and 2.5 units for L and R brow lift  Botox today 10 units as marked below: - Frown complex 5 units - Brow lift 2.5 units each side, total of 5 units  Intralesional injection - face Location: Frown complex, brow lift  Informed consent: Discussed risks (infection, pain, bleeding, bruising, swelling, allergic reaction, paralysis of nearby muscles, eyelid droop, double vision, neck weakness, difficulty breathing, headache, undesirable cosmetic result, and need for additional treatment) and benefits of the procedure, as well as the alternatives.  Informed consent was obtained.  Preparation: The area was cleansed with alcohol.  Procedure Details:  Botox was injected into the dermis with a 30-gauge needle. Pressure applied  to any bleeding. Ice packs offered for swelling.  Lot Number:  [id number] Expiration:  12/2021  Total Units Injected:  10  Plan: Patient was instructed to remain upright for 4 hours. Patient was instructed to avoid massaging the face and avoid vigorous exercise for the rest of the day. Tylenol may be used for headache.  Allow 2 weeks before returning to clinic for additional dosing as needed. Patient will call for any problems.   Return in about 2 months (around 01/30/2020) for Botox.   I, Othelia Pulling, RMA, am acting as scribe for Sarina Ser, MD .  Documentation: I have reviewed the above documentation for accuracy and completeness, and I agree with the above.  Sarina Ser, MD

## 2019-12-07 ENCOUNTER — Encounter: Payer: Self-pay | Admitting: Dermatology

## 2019-12-08 ENCOUNTER — Other Ambulatory Visit: Payer: Self-pay | Admitting: Dermatology

## 2019-12-10 DIAGNOSIS — Z1231 Encounter for screening mammogram for malignant neoplasm of breast: Secondary | ICD-10-CM | POA: Diagnosis not present

## 2019-12-10 LAB — HM MAMMOGRAPHY

## 2019-12-15 ENCOUNTER — Ambulatory Visit: Payer: BC Managed Care – PPO | Admitting: Dermatology

## 2020-01-06 ENCOUNTER — Encounter: Payer: Self-pay | Admitting: Internal Medicine

## 2020-01-06 ENCOUNTER — Other Ambulatory Visit: Payer: Self-pay | Admitting: Internal Medicine

## 2020-01-06 ENCOUNTER — Other Ambulatory Visit: Payer: Self-pay

## 2020-01-06 MED ORDER — PANTOPRAZOLE SODIUM 40 MG PO TBEC: 40.0000 mg | DELAYED_RELEASE_TABLET | Freq: Two times a day (BID) | ORAL | 1 refills | Status: DC

## 2020-01-07 ENCOUNTER — Other Ambulatory Visit: Payer: Self-pay

## 2020-01-07 ENCOUNTER — Ambulatory Visit (INDEPENDENT_AMBULATORY_CARE_PROVIDER_SITE_OTHER): Payer: Medicare PPO

## 2020-01-07 DIAGNOSIS — Z23 Encounter for immunization: Secondary | ICD-10-CM

## 2020-01-25 ENCOUNTER — Other Ambulatory Visit: Payer: Self-pay

## 2020-01-25 ENCOUNTER — Ambulatory Visit (INDEPENDENT_AMBULATORY_CARE_PROVIDER_SITE_OTHER): Payer: Self-pay | Admitting: Dermatology

## 2020-01-25 ENCOUNTER — Encounter: Payer: Self-pay | Admitting: Dermatology

## 2020-01-25 DIAGNOSIS — L988 Other specified disorders of the skin and subcutaneous tissue: Secondary | ICD-10-CM

## 2020-01-25 DIAGNOSIS — D229 Melanocytic nevi, unspecified: Secondary | ICD-10-CM

## 2020-01-25 NOTE — Progress Notes (Signed)
   Follow-Up Visit   Subjective  April Santos is a 64 y.o. female who presents for the following: Facial Elastosis (Patient is here today for Botox injections ).  The following portions of the chart were reviewed this encounter and updated as appropriate:  Tobacco  Allergies  Meds  Problems  Med Hx  Surg Hx  Fam Hx     Review of Systems:  No other skin or systemic complaints except as noted in HPI or Assessment and Plan.  Objective  Well appearing patient in no apparent distress; mood and affect are within normal limits.  A focused examination was performed including the face and chest. Relevant physical exam findings are noted in the Assessment and Plan.  Objective  R chest: 3 papules on the right chest   Objective  Face: Rhytides and volume loss.   Images     Assessment & Plan  Nevus R chest  Benign appearing, observe. May biopsy at follow up appointment if irritating.   Elastosis of skin Face  Botox Injection - Face Location: See attached image  Informed consent: Discussed risks (infection, pain, bleeding, bruising, swelling, allergic reaction, paralysis of nearby muscles, eyelid droop, double vision, neck weakness, difficulty breathing, headache, undesirable cosmetic result, and need for additional treatment) and benefits of the procedure, as well as the alternatives.  Informed consent was obtained.  Preparation: The area was cleansed with alcohol.  Procedure Details:  Botox was injected into the dermis with a 30-gauge needle. Pressure applied to any bleeding. Ice packs offered for swelling.  Lot Number:  [id number] Expiration:  04/2022  Total Units Injected:  47.5  Plan: Patient was instructed to remain upright for 4 hours. Patient was instructed to avoid massaging the face and avoid vigorous exercise for the rest of the day. Tylenol may be used for headache.  Allow 2 weeks before returning to clinic for additional dosing as needed. Patient will call  for any problems.   Return in about 3 months (around 04/26/2020) for cosmetic - Botox; follow up as scheduled will also perform shave biopsies at that time.  Luther Redo, CMA, am acting as scribe for Sarina Ser, MD .  Documentation: I have reviewed the above documentation for accuracy and completeness, and I agree with the above.  Sarina Ser, MD

## 2020-01-27 DIAGNOSIS — M5432 Sciatica, left side: Secondary | ICD-10-CM | POA: Diagnosis not present

## 2020-01-27 DIAGNOSIS — M65332 Trigger finger, left middle finger: Secondary | ICD-10-CM | POA: Diagnosis not present

## 2020-01-28 ENCOUNTER — Encounter: Payer: Self-pay | Admitting: Internal Medicine

## 2020-01-28 ENCOUNTER — Ambulatory Visit: Payer: Medicare PPO | Admitting: Internal Medicine

## 2020-01-28 ENCOUNTER — Other Ambulatory Visit: Payer: Self-pay

## 2020-01-28 VITALS — BP 110/76 | HR 67 | Temp 98.2°F | Resp 16 | Ht 62.01 in | Wt 137.2 lb

## 2020-01-28 DIAGNOSIS — L4050 Arthropathic psoriasis, unspecified: Secondary | ICD-10-CM

## 2020-01-28 DIAGNOSIS — E559 Vitamin D deficiency, unspecified: Secondary | ICD-10-CM | POA: Diagnosis not present

## 2020-01-28 DIAGNOSIS — Z13220 Encounter for screening for lipoid disorders: Secondary | ICD-10-CM

## 2020-01-28 DIAGNOSIS — M79605 Pain in left leg: Secondary | ICD-10-CM | POA: Diagnosis not present

## 2020-01-28 DIAGNOSIS — Z1231 Encounter for screening mammogram for malignant neoplasm of breast: Secondary | ICD-10-CM

## 2020-01-28 DIAGNOSIS — F439 Reaction to severe stress, unspecified: Secondary | ICD-10-CM

## 2020-01-28 DIAGNOSIS — K219 Gastro-esophageal reflux disease without esophagitis: Secondary | ICD-10-CM

## 2020-01-28 LAB — LIPID PANEL
Cholesterol: 195 mg/dL (ref 0–200)
HDL: 80.6 mg/dL (ref >39.00)
LDL Cholesterol: 104 mg/dL — ABNORMAL HIGH (ref 0–99)
NonHDL: 114.62
Total CHOL/HDL Ratio: 2
Triglycerides: 54 mg/dL (ref 0.0–149.0)
VLDL: 10.8 mg/dL (ref 0.0–40.0)

## 2020-01-28 LAB — CBC WITH DIFFERENTIAL/PLATELET
Basophils Absolute: 0 10*3/uL (ref 0.0–0.1)
Basophils Relative: 0.3 % (ref 0.0–3.0)
Eosinophils Absolute: 0 10*3/uL (ref 0.0–0.7)
Eosinophils Relative: 0 % (ref 0.0–5.0)
HCT: 38.9 % (ref 36.0–46.0)
Hemoglobin: 13 g/dL (ref 12.0–15.0)
Lymphocytes Relative: 11.4 % — ABNORMAL LOW (ref 12.0–46.0)
Lymphs Abs: 0.8 10*3/uL (ref 0.7–4.0)
MCHC: 33.5 g/dL (ref 30.0–36.0)
MCV: 83.3 fl (ref 78.0–100.0)
Monocytes Absolute: 0.2 10*3/uL (ref 0.1–1.0)
Monocytes Relative: 2.2 % — ABNORMAL LOW (ref 3.0–12.0)
Neutro Abs: 6.4 10*3/uL (ref 1.4–7.7)
Neutrophils Relative %: 86.1 % — ABNORMAL HIGH (ref 43.0–77.0)
Platelets: 321 10*3/uL (ref 150.0–400.0)
RBC: 4.67 Mil/uL (ref 3.87–5.11)
RDW: 14.5 % (ref 11.5–15.5)
WBC: 7.4 10*3/uL (ref 4.0–10.5)

## 2020-01-28 LAB — COMPREHENSIVE METABOLIC PANEL
ALT: 16 U/L (ref 0–35)
AST: 18 U/L (ref 0–37)
Albumin: 4.2 g/dL (ref 3.5–5.2)
Alkaline Phosphatase: 73 U/L (ref 39–117)
BUN: 22 mg/dL (ref 6–23)
CO2: 26 mEq/L (ref 19–32)
Calcium: 9.6 mg/dL (ref 8.4–10.5)
Chloride: 105 mEq/L (ref 96–112)
Creatinine, Ser: 0.73 mg/dL (ref 0.40–1.20)
GFR: 86.16 mL/min (ref >60.00)
Glucose, Bld: 124 mg/dL — ABNORMAL HIGH (ref 70–99)
Potassium: 4.3 mEq/L (ref 3.5–5.1)
Sodium: 139 mEq/L (ref 135–145)
Total Bilirubin: 0.4 mg/dL (ref 0.2–1.2)
Total Protein: 6.6 g/dL (ref 6.0–8.3)

## 2020-01-28 LAB — TSH: TSH: 0.39 u[IU]/mL (ref 0.35–4.50)

## 2020-01-28 LAB — VITAMIN D 25 HYDROXY (VIT D DEFICIENCY, FRACTURES): VITD: 47.48 ng/mL (ref 30.00–100.00)

## 2020-01-28 NOTE — Progress Notes (Signed)
Patient ID: KEMIAH BOOZ, female   DOB: [birth year], 64 y.o.   MRN: [id number]   Subjective:    Patient ID: [name], female    DOB: October 31, 1955, 64 y.o.   MRN: [id number]  HPI This visit occurred during the SARS-CoV-2 public health emergency.  Safety protocols were in place, including screening questions prior to the visit, additional usage of staff PPE, and extensive cleaning of exam room while observing appropriate contact time as indicated for disinfecting solutions.  Patient here for a scheduled follow up. She reports she is doing relatively well.  She reports increased stress with cleaning out her mother's house.  Just developed some left leg pain.  Also left hand/finger discomfort.  Saw ortho yesterday. Placed on prednisone.  S/p injection for trigger finger.  Already feeling better.  Prednisone did keep her awake last night.  Noticed some dizziness after taking, but this has resolved.  No chest pain or sob.  Acid reflux improved with decreasing portion sizes and stopping soft drinks.  She has lost weight.  No abdominal pain.  Had constipation with omeprazole.  Doing better on protonix.     Past Medical History:  Diagnosis Date  . Arthritis   . Colon polyps   . Disc disorder   . Dysphagia   . GERD (gastroesophageal reflux disease)   . History of uterine fibroid   . Psoriasis    Past Surgical History:  Procedure Laterality Date  . ABDOMINAL HYSTERECTOMY    . BREAST SURGERY  1999   biopsy  . carpal  2005   Tunnel right hand  . COLONOSCOPY WITH ESOPHAGOGASTRODUODENOSCOPY (EGD)    . COLONOSCOPY WITH PROPOFOL N/A 06/10/2016   Procedure: COLONOSCOPY WITH PROPOFOL;  Surgeon: Manya Silvas, MD;  Location: [facility] ENDOSCOPY;  Service: Endoscopy;  Laterality: N/A;  . ESOPHAGOGASTRODUODENOSCOPY (EGD) WITH PROPOFOL N/A 06/10/2016   Procedure: ESOPHAGOGASTRODUODENOSCOPY (EGD) WITH PROPOFOL;  Surgeon: Manya Silvas, MD;  Location: [facility] ENDOSCOPY;  Service: Endoscopy;  Laterality: N/A;   . FOOT SURGERY  02/07/1003   right foot benign tumor  . SACROILIAC JOINT INJECTION  08/17/13 & 04/13/14   Dr. Sharlet Salina  . SIGMOIDOSCOPY    . UTERINE FIBROID SURGERY  1997   Family History  Problem Relation Age of Onset  . Breast cancer Mother        double masectomy  . Arthritis Father   . GER disease Father   . Heart disease Other        paternal uncles  . Colon cancer Neg Hx    Social History   Socioeconomic History  . Marital status: Divorced    Spouse name: Not on file  . Number of children: 0  . Years of education: Not on file  . Highest education level: Not on file  Occupational History  . Not on file  Tobacco Use  . Smoking status: Never Smoker  . Smokeless tobacco: Never Used  Substance and Sexual Activity  . Alcohol use: Yes    Alcohol/week: 1.0 standard drink    Types: 1 Glasses of wine per week    Comment: bimonthly  . Drug use: No  . Sexual activity: Not on file  Other Topics Concern  . Not on file  Social History Narrative  . Not on file   Social Determinants of Health   Financial Resource Strain:   . Difficulty of Paying Living Expenses: Not on file  Food Insecurity:   . Worried About Charity fundraiser in the  Last Year: Not on file  . Ran Out of Food in the Last Year: Not on file  Transportation Needs:   . Lack of Transportation (Medical): Not on file  . Lack of Transportation (Non-Medical): Not on file  Physical Activity:   . Days of Exercise per Week: Not on file  . Minutes of Exercise per Session: Not on file  Stress:   . Feeling of Stress : Not on file  Social Connections:   . Frequency of Communication with Friends and Family: Not on file  . Frequency of Social Gatherings with Friends and Family: Not on file  . Attends Religious Services: Not on file  . Active Member of Clubs or Organizations: Not on file  . Attends Archivist Meetings: Not on file  . Marital Status: Not on file    Outpatient Encounter Medications as of  01/28/2020  Medication Sig  . ALPRAZolam (XANAX) 0.25 MG tablet Take 1 tablet (0.25 mg total) by mouth at bedtime as needed.  Marland Kitchen Apremilast (OTEZLA) 30 MG TABS Take 1 tablet by mouth daily.  . Calcium Citrate (CITRACAL PO) Take by mouth 4 (four) times daily.  [name] (EUCRISA) 2 % OINT Apply once to twice daily as needed  . estradiol (ESTRACE) 0.1 MG/GM vaginal cream Place 2 g vaginally 2 (two) times a week.  . estradiol (ESTRACE) 1 MG tablet Take 1 tablet (1 mg total) by mouth daily.  . famotidine (PEPCID) 40 MG tablet Take one tablet 30 minutes before evening meal  . fluticasone (CUTIVATE) 0.05 % cream Apply 1 application topically as needed.  Marland Kitchen HYDROcodone-acetaminophen (NORCO) 5-325 MG per tablet 1/2 - 1 tablet q 8 hours prn  . ibuprofen (ADVIL,MOTRIN) 200 MG tablet Take 200 mg by mouth every 6 (six) hours as needed.  . IRON PO Take by mouth.  . montelukast (SINGULAIR) 10 MG tablet TAKE ONE TABLET BY MOUTH EVERY DAY  . Multiple Vitamins-Minerals (MULTIVITAL PO) Take by mouth daily.  . pantoprazole (PROTONIX) 40 MG tablet Take 1 tablet (40 mg total) by mouth 2 (two) times daily.   No facility-administered encounter medications on file as of 01/28/2020.    Review of Systems  Constitutional: Negative for appetite change and unexpected weight change.  HENT: Negative for congestion and sinus pressure.   Respiratory: Negative for cough, chest tightness and shortness of breath.   Cardiovascular: Negative for chest pain, palpitations and leg swelling.  Gastrointestinal: Negative for abdominal pain, diarrhea, nausea and vomiting.  Genitourinary: Negative for difficulty urinating and dysuria.  Musculoskeletal: Negative for joint swelling and myalgias.       Leg pain. Pain from trigger finger as outlined.   Skin: Negative for color change and rash.  Neurological: Negative for dizziness, light-headedness and headaches.  Psychiatric/Behavioral: Negative for agitation and dysphoric mood.        Objective:    Physical Exam Constitutional:      General: She is not in acute distress.    Appearance: Normal appearance.  HENT:     Head: Normocephalic and atraumatic.     Right Ear: External ear normal.     Left Ear: External ear normal.  Eyes:     General: No scleral icterus.       Right eye: No discharge.        Left eye: No discharge.     Conjunctiva/sclera: Conjunctivae normal.  Neck:     Thyroid: No thyromegaly.  Cardiovascular:     Rate and Rhythm: Normal rate and  regular rhythm.  Pulmonary:     Effort: No respiratory distress.     Breath sounds: Normal breath sounds. No wheezing.  Abdominal:     General: Bowel sounds are normal.     Palpations: Abdomen is soft.     Tenderness: There is no abdominal tenderness.  Musculoskeletal:        General: No swelling or tenderness.     Cervical back: Neck supple. No tenderness.  Lymphadenopathy:     Cervical: No cervical adenopathy.  Skin:    Findings: No erythema or rash.  Neurological:     Mental Status: She is alert.  Psychiatric:        Mood and Affect: Mood normal.        Behavior: Behavior normal.     BP 110/76 (BP Location: Left Arm, Patient Position: Sitting)   Pulse 67   Temp 98.2 F (36.8 C)   Resp 16   Ht 5' 2.01" (1.575 m)   Wt 137 lb 3.2 oz (62.2 kg)   LMP 02/26/2002   SpO2 97%   BMI 25.09 kg/m  Wt Readings from Last 3 Encounters:  01/28/20 137 lb 3.2 oz (62.2 kg)  08/23/19 144 lb 2 oz (65.4 kg)  07/26/19 144 lb 9.6 oz (65.6 kg)     Lab Results  Component Value Date   WBC 7.4 01/28/2020   HGB 13.0 01/28/2020   HCT 38.9 01/28/2020   PLT 321.0 01/28/2020   GLUCOSE 124 (H) 01/28/2020   CHOL 195 01/28/2020   TRIG 54.0 01/28/2020   HDL 80.60 01/28/2020   LDLCALC 104 (H) 01/28/2020   ALT 16 01/28/2020   AST 18 01/28/2020   NA 139 01/28/2020   K 4.3 01/28/2020   CL 105 01/28/2020   CREATININE 0.73 01/28/2020   BUN 22 01/28/2020   CO2 26 01/28/2020   TSH 0.39 01/28/2020     US Abdomen Complete  Result Date: 08/11/2019 CLINICAL DATA:  Chest and abdominal pain. EXAM: ABDOMEN ULTRASOUND COMPLETE COMPARISON:  None. FINDINGS: Gallbladder: Cholelithiasis with 2.4 cm stone present over the fundus. Negative sonographic Murphy sign. No significant gallbladder wall thickening or adjacent free fluid. Common bile duct: Diameter: 3.3 mm. Liver: Minimal increased parenchymal echogenicity compatible with mild steatosis without focal mass. Portal vein is patent on color Doppler imaging with normal direction of blood flow towards the liver. IVC: No abnormality visualized. Pancreas: Visualized portion unremarkable. Spleen: Size and appearance within normal limits. Right Kidney: Length: 10.3 cm. Echogenicity within normal limits. No mass or hydronephrosis visualized. Left Kidney: Length: 10.4 cm. Echogenicity within normal limits. No mass or hydronephrosis visualized. Abdominal aorta: No aneurysm visualized. Other findings: None. IMPRESSION: 1. Mild cholelithiasis without additional sonographic evidence to suggest cholecystitis. 2.  Minimal hepatic steatosis without focal mass. Electronically Signed   By: Marin Olp M.D.   On: 08/11/2019 13:59       Assessment & Plan:   Problem List Items Addressed This Visit    Vitamin D deficiency - Primary    Check vitamin D level.       Relevant Orders   VITAMIN D 25 Hydroxy (Vit-D Deficiency, Fractures) (Completed)   Stress    Increased stress.  Discussed with her today.  Does not feel she needs any further intervention.  Follow.       Psoriatic arthritis ([place])    Has been on otezla.  Followed by Dr Nehemiah Massed.       Leg pain, left    Just saw ortho.  On prednisone.  Already feeling better.  Follow.       GERD (gastroesophageal reflux disease)    Saw GI. Did not tolerated omeprazole.  On protonix.  Doing better.  Adjusted portion sizes.  Has lost weight.  Follow.       Relevant Orders   Comprehensive metabolic panel (Completed)    CBC with Differential/Platelet (Completed)   TSH (Completed)   Breast cancer screening    Order placed for mammogram.  Needs to schedule.       Relevant Orders   MM 3D SCREEN BREAST BILATERAL    Other Visit Diagnoses    Screening cholesterol level       Relevant Orders   Lipid panel (Completed)       Einar Pheasant, MD

## 2020-01-30 ENCOUNTER — Encounter: Payer: Self-pay | Admitting: Internal Medicine

## 2020-01-30 ENCOUNTER — Telehealth: Payer: Self-pay | Admitting: Internal Medicine

## 2020-01-30 DIAGNOSIS — R739 Hyperglycemia, unspecified: Secondary | ICD-10-CM

## 2020-01-30 DIAGNOSIS — Z1239 Encounter for other screening for malignant neoplasm of breast: Secondary | ICD-10-CM | POA: Insufficient documentation

## 2020-01-30 NOTE — Assessment & Plan Note (Signed)
Check vitamin D level 

## 2020-01-30 NOTE — Assessment & Plan Note (Signed)
Order placed for mammogram.  Needs to schedule.

## 2020-01-30 NOTE — Assessment & Plan Note (Signed)
Increased stress.  Discussed with her today.  Does not feel she needs any further intervention.  Follow.

## 2020-01-30 NOTE — Assessment & Plan Note (Signed)
Has been on otezla.  Followed by Dr Nehemiah Massed.

## 2020-01-30 NOTE — Telephone Encounter (Signed)
Please schedule pt for a fasting lab appt in 6 weeks and contact her with appt date and time.   Orders placed for labs.    Thanks.

## 2020-01-30 NOTE — Assessment & Plan Note (Signed)
Saw GI. Did not tolerated omeprazole.  On protonix.  Doing better.  Adjusted portion sizes.  Has lost weight.  Follow.

## 2020-01-30 NOTE — Assessment & Plan Note (Signed)
Just saw ortho.  On prednisone.  Already feeling better.  Follow.

## 2020-01-31 NOTE — Telephone Encounter (Signed)
Scheduled

## 2020-02-09 ENCOUNTER — Ambulatory Visit: Payer: Medicare PPO | Admitting: Dermatology

## 2020-02-23 ENCOUNTER — Encounter: Payer: Self-pay | Admitting: Internal Medicine

## 2020-02-23 MED ORDER — ESTRADIOL 1 MG PO TABS
1.0000 mg | ORAL_TABLET | Freq: Every day | ORAL | 1 refills | Status: DC
Start: 2020-02-23 — End: 2020-03-20

## 2020-02-23 NOTE — Telephone Encounter (Signed)
rx sent in for estradiol.

## 2020-02-28 ENCOUNTER — Ambulatory Visit: Payer: Medicare PPO | Admitting: Dermatology

## 2020-03-08 ENCOUNTER — Telehealth: Payer: Self-pay | Admitting: Internal Medicine

## 2020-03-08 NOTE — Telephone Encounter (Signed)
lft vm for pt to call ofc regarding mammo at [place].

## 2020-03-15 ENCOUNTER — Other Ambulatory Visit: Payer: Self-pay

## 2020-03-15 ENCOUNTER — Other Ambulatory Visit (INDEPENDENT_AMBULATORY_CARE_PROVIDER_SITE_OTHER): Payer: Medicare PPO

## 2020-03-15 DIAGNOSIS — R739 Hyperglycemia, unspecified: Secondary | ICD-10-CM

## 2020-03-15 LAB — HEMOGLOBIN A1C: Hgb A1c MFr Bld: 5.7 % (ref 4.6–6.5)

## 2020-03-16 LAB — GLUCOSE, FASTING: Glucose, Bld: 88 mg/dL (ref 65–99)

## 2020-03-16 LAB — EXTRA SPECIMEN

## 2020-03-20 ENCOUNTER — Other Ambulatory Visit (HOSPITAL_COMMUNITY)
Admission: RE | Admit: 2020-03-20 | Discharge: 2020-03-20 | Disposition: A | Discharge status: Home / Self Care | Payer: Medicare PPO | Source: Ambulatory Visit | Attending: Advanced Practice Midwife | Admitting: Advanced Practice Midwife

## 2020-03-20 ENCOUNTER — Other Ambulatory Visit: Payer: Self-pay

## 2020-03-20 ENCOUNTER — Ambulatory Visit: Payer: Medicare PPO | Admitting: Advanced Practice Midwife

## 2020-03-20 VITALS — BP 114/70 | Ht 61.0 in | Wt 139.8 lb

## 2020-03-20 DIAGNOSIS — Z9189 Other specified personal risk factors, not elsewhere classified: Secondary | ICD-10-CM | POA: Diagnosis not present

## 2020-03-20 DIAGNOSIS — N952 Postmenopausal atrophic vaginitis: Secondary | ICD-10-CM

## 2020-03-20 DIAGNOSIS — Z1272 Encounter for screening for malignant neoplasm of vagina: Secondary | ICD-10-CM

## 2020-03-20 DIAGNOSIS — Z01419 Encounter for gynecological examination (general) (routine) without abnormal findings: Secondary | ICD-10-CM | POA: Insufficient documentation

## 2020-03-20 MED ORDER — ESTRADIOL 0.1 MG/GM VA CREA: 1.0000 | TOPICAL_CREAM | VAGINAL | 2 refills | Status: DC

## 2020-03-20 MED ORDER — ESTRADIOL 1 MG PO TABS: 1.0000 mg | ORAL_TABLET | Freq: Every day | ORAL | 11 refills | Status: DC

## 2020-03-20 NOTE — Progress Notes (Signed)
Annual Exam

## 2020-03-20 NOTE — Patient Instructions (Signed)
Menopause and Hormone Replacement Therapy Menopause is a normal time of life when menstrual periods stop completely and the ovaries stop producing the female hormones estrogen and progesterone. This lack of hormones can affect your health and cause undesirable symptoms. Hormone replacement therapy (HRT) can relieve some of those symptoms. What is hormone replacement therapy? HRT is the use of artificial (synthetic) hormones to replace hormones that your body has stopped producing because you have reached menopause. What are my options for HRT?  HRT may consist of the synthetic hormones estrogen and progestin, or it may consist of only estrogen (estrogen-only therapy). You and your health care provider will decide which form of HRT is best for you. If you choose to be on HRT and you have a uterus, estrogen and progestin are usually prescribed. Estrogen-only therapy is used for women who do not have a uterus. Possible options for taking HRT include:  Pills.  Patches.  Gels.  Sprays.  Vaginal cream.  Vaginal rings.  Vaginal inserts. The amount of hormone(s) that you take and how long you take the hormone(s) varies according to your health. It is important to:  Begin HRT with the lowest possible dosage.  Stop HRT as soon as your health care provider tells you to stop.  Work with your health care provider so that you feel informed and comfortable with your decisions. What are the benefits of HRT? HRT can reduce the frequency and severity of menopausal symptoms. Benefits of HRT vary according to the kind of symptoms that you have, how severe they are, and your overall health. HRT may help to improve the following symptoms of menopause:  Hot flashes and night sweats. These are sudden feelings of heat that spread over the face and body. The skin may turn red, like a blush. Night sweats are hot flashes that happen while you are sleeping or trying to sleep.  Bone loss (osteoporosis). The  body loses calcium more quickly after menopause, causing the bones to become weaker. This can increase the risk for bone breaks (fractures).  Vaginal dryness. The lining of the vagina can become thin and dry, which can cause pain during sex or cause infection, burning, or itching.  Urinary tract infections.  Urinary incontinence. This is the inability to control when you pass urine.  Irritability.  Short-term memory problems. What are the risks of HRT? Risks of HRT vary depending on your individual health and medical history. Risks of HRT also depend on whether you receive both estrogen and progestin or you receive estrogen only. HRT may increase the risk of:  Spotting. This is when a small amount of blood leaks from the vagina unexpectedly.  Endometrial cancer. This cancer is in the lining of the uterus (endometrium).  Breast cancer.  Increased density of breast tissue. This can make it harder to find breast cancer on a breast X-ray (mammogram).  Stroke.  Heart disease.  Blood clots.  Gallbladder disease.  Liver disease. Risks of HRT can increase if you have any of the following conditions:  Endometrial cancer.  Liver disease.  Heart disease.  Breast cancer.  History of blood clots.  History of stroke. Follow these instructions at home:  Take over-the-counter and prescription medicines only as told by your health care provider.  Get mammograms, pelvic exams, and medical checkups as often as told by your health care provider.  Have Pap tests done as often as told by your health care provider. A Pap test is sometimes called a Pap smear. It   is a screening test that is used to check for signs of cancer of the cervix and vagina. A Pap test can also identify the presence of infection or precancerous changes. Pap tests may be done: ? Every 3 years, starting at age 65. ? Every 5 years, starting after age 65, in combination with testing for human papillomavirus  (HPV). ? More often or less often depending on other medical conditions you have, your age, and other risk factors.  It is up to you to get the results of your Pap test. Ask your health care provider, or the department that is doing the test, when your results will be ready.  Keep all follow-up visits as told by your health care provider. This is important. Contact a health care provider if you have:  Pain or swelling in your legs.  Shortness of breath.  Chest pain.  Lumps or changes in your breasts or armpits.  Slurred speech.  Pain, burning, or bleeding when you urinate.  Unusual vaginal bleeding.  Dizziness or headaches.  Weakness or numbness in any part of your arms or legs.  Pain in your abdomen. Summary  Menopause is a normal time of life when menstrual periods stop completely and the ovaries stop producing the female hormones estrogen and progesterone.  Hormone replacement therapy (HRT) can relieve some of the symptoms of menopause.  HRT can reduce the frequency and severity of menopausal symptoms.  Risks of HRT vary depending on your individual health and medical history. This information is not intended to replace advice given to you by your health care provider. Make sure you discuss any questions you have with your health care provider. Document Revised: 11/25/2017 Document Reviewed: 11/25/2017 Elsevier Patient Education  2020 [place] Maintenance After Age 70 After age 32, you are at a higher risk for certain long-term diseases and infections as well as injuries from falls. Falls are a major cause of broken bones and head injuries in people who are older than age 60. Getting regular preventive care can help to keep you healthy and well. Preventive care includes getting regular testing and making lifestyle changes as recommended by your health care provider. Talk with your health care provider about:  Which screenings and tests you should have. A  screening is a test that checks for a disease when you have no symptoms.  A diet and exercise plan that is right for you. What should I know about screenings and tests to prevent falls? Screening and testing are the best ways to find a health problem early. Early diagnosis and treatment give you the best chance of managing medical conditions that are common after age 11. Certain conditions and lifestyle choices may make you more likely to have a fall. Your health care provider may recommend:  Regular vision checks. Poor vision and conditions such as cataracts can make you more likely to have a fall. If you wear glasses, make sure to get your prescription updated if your vision changes.  Medicine review. Work with your health care provider to regularly review all of the medicines you are taking, including over-the-counter medicines. Ask your health care provider about any side effects that may make you more likely to have a fall. Tell your health care provider if any medicines that you take make you feel dizzy or sleepy.  Osteoporosis screening. Osteoporosis is a condition that causes the bones to get weaker. This can make the bones weak and cause them to break more easily.  Blood  pressure screening. Blood pressure changes and medicines to control blood pressure can make you feel dizzy.  Strength and balance checks. Your health care provider may recommend certain tests to check your strength and balance while standing, walking, or changing positions.  Foot health exam. Foot pain and numbness, as well as not wearing proper footwear, can make you more likely to have a fall.  Depression screening. You may be more likely to have a fall if you have a fear of falling, feel emotionally low, or feel unable to do activities that you used to do.  Alcohol use screening. Using too much alcohol can affect your balance and may make you more likely to have a fall. What actions can I take to lower my risk of  falls? General instructions  Talk with your health care provider about your risks for falling. Tell your health care provider if: ? You fall. Be sure to tell your health care provider about all falls, even ones that seem minor. ? You feel dizzy, sleepy, or off-balance.  Take over-the-counter and prescription medicines only as told by your health care provider. These include any supplements.  Eat a healthy diet and maintain a healthy weight. A healthy diet includes low-fat dairy products, low-fat (lean) meats, and fiber from whole grains, beans, and lots of fruits and vegetables. Home safety  Remove any tripping hazards, such as rugs, cords, and clutter.  Install safety equipment such as grab bars in bathrooms and safety rails on stairs.  Keep rooms and walkways well-lit. Activity   Follow a regular exercise program to stay fit. This will help you maintain your balance. Ask your health care provider what types of exercise are appropriate for you.  If you need a cane or walker, use it as recommended by your health care provider.  Wear supportive shoes that have nonskid soles. Lifestyle  Do not drink alcohol if your health care provider tells you not to drink.  If you drink alcohol, limit how much you have: ? 0-1 drink a day for women. ? 0-2 drinks a day for men.  Be aware of how much alcohol is in your drink. In the [place] one drink equals one typical bottle of beer (12 oz), one-half glass of wine (5 oz), or one shot of hard liquor (1 oz).  Do not use any products that contain nicotine or tobacco, such as cigarettes and e-cigarettes. If you need help quitting, ask your health care provider. Summary  Having a healthy lifestyle and getting preventive care can help to protect your health and wellness after age 59.  Screening and testing are the best way to find a health problem early and help you avoid having a fall. Early diagnosis and treatment give you the best chance for  managing medical conditions that are more common for people who are older than age 77.  Falls are a major cause of broken bones and head injuries in people who are older than age 34. Take precautions to prevent a fall at home.  Work with your health care provider to learn what changes you can make to improve your health and wellness and to prevent falls. This information is not intended to replace advice given to you by your health care provider. Make sure you discuss any questions you have with your health care provider. Document Revised: 07/16/2018 Document Reviewed: 02/05/2017 Elsevier Patient Education  2020 [employer].

## 2020-03-21 DIAGNOSIS — R87612 Low grade squamous intraepithelial lesion on cytologic smear of cervix (LGSIL): Secondary | ICD-10-CM | POA: Diagnosis not present

## 2020-03-22 ENCOUNTER — Encounter: Payer: Self-pay | Admitting: Advanced Practice Midwife

## 2020-03-22 NOTE — Progress Notes (Addendum)
Gynecology Annual Exam  Date of Service: 03/20/2020  PCP: Einar Pheasant, MD  Chief Complaint:  Chief Complaint  Patient presents with  . Gynecologic Exam    Annual exam    History of Present Illness:Patient is a 64 y.o. No obstetric history on file. presents for annual exam. The patient has no complaints today. She is here for PAP smear and refills of her HRT. Her mammograms are ordered through her PCP. She does request a referral for a Dexa scan. Her colonoscopy is up to date. She had a total hysterectomy due to fibroids approximately 20 years ago and thinks her ovaries were removed at that time. That is when she started using HRT. She is currently using both Estrace cream weekly and Estrace 1 mg tablets daily. She had worsening vaginal dryness with an attempted weaning off HRT some time ago. She denies hot flashes. We discussed usual recommendation of lowest dose for shortest time necessary. Also discussed just using cream for more localized treatment.  She is not ready to wean off HRT at this time.  LMP: Patient's last menstrual period was 02/26/2002.  Postcoital Bleeding: no Dysmenorrhea: not applicable  The patient is sexually active. She denies dyspareunia.  The patient does perform self breast exams.  There is notable family history of breast or ovarian cancer in her family. Her mother was diagnosed with breast cancer at age 64. She is interested in [facility], however, she would like to contact [facility] to check on insurance coverage.   The patient wears seatbelts: yes.   The patient has regular exercise: she walks and bikes when her hips are feeling ok. She admits healthy diet and admits she could drink more water. She has adequate sleep.    The patient denies current symptoms of depression.     Review of Systems: Review of Systems  Constitutional: Negative for chills and fever.  HENT: Negative for congestion, ear discharge, ear pain, hearing loss, sinus pain and sore  throat.   Eyes: Negative for blurred vision and double vision.  Respiratory: Negative for cough, shortness of breath and wheezing.   Cardiovascular: Negative for chest pain, palpitations and leg swelling.  Gastrointestinal: Negative for abdominal pain, blood in stool, constipation, diarrhea, heartburn, melena, nausea and vomiting.  Genitourinary: Negative for dysuria, flank pain, frequency, hematuria and urgency.  Musculoskeletal: Positive for joint pain. Negative for back pain and myalgias.  Skin: Negative for itching and rash.  Neurological: Negative for dizziness, tingling, tremors, sensory change, speech change, focal weakness, seizures, loss of consciousness, weakness and headaches.  Endo/Heme/Allergies: Negative for environmental allergies. Does not bruise/bleed easily.  Psychiatric/Behavioral: Negative for depression, hallucinations, memory loss, substance abuse and suicidal ideas. The patient is not nervous/anxious and does not have insomnia.     Past Medical History:  Patient Active Problem List   Diagnosis Date Noted  . Breast cancer screening 01/30/2020  . Vitamin D deficiency 08/02/2018  . Generalized osteoarthritis of hand 09/04/2017  . Right hand pain 03/11/2016  . Psoriatic arthritis ([place]) 02/11/2016  . Left shoulder pain 02/05/2016  . Low iron 06/05/2015  . Skin lesion 06/05/2015  . Current use of estrogen therapy 11/29/2014  . HNP (herniated nucleus pulposus), lumbar 10/20/2014  . Lumbar radiculitis 10/20/2014  . Leg pain, left 05/16/2014  . Health care maintenance 05/16/2014    Mammogram 10/02/17 - Birads 0.  F/u left breast mammogram 10/27/17 - Birads 1.  Recommended mammogram in one year.   Mammogram 12/10/19 - Birads II.     Marland Kitchen  Stress 10/17/2013  . Osteoarthritis of knee 09/15/2013  . Sacroiliitis ([place]) 08/17/2013  . Right knee pain 07/20/2013  . Toenail fungus 06/12/2013  . Osteopenia 05/14/2013    Bone density (Dr Georga Bora) - T score (-1.3), femoral neck  (-1.6).    Marland Kitchen Carpal tunnel syndrome 11/26/2012  . Environmental allergies 05/03/2012  . History of condyloma acuminatum 05/03/2012  . GERD (gastroesophageal reflux disease) 05/03/2012    Past Surgical History:  Past Surgical History:  Procedure Laterality Date  . ABDOMINAL HYSTERECTOMY    . BREAST SURGERY  1999   biopsy  . carpal  2005   Tunnel right hand  . COLONOSCOPY WITH ESOPHAGOGASTRODUODENOSCOPY (EGD)    . COLONOSCOPY WITH PROPOFOL N/A 06/10/2016   Procedure: COLONOSCOPY WITH PROPOFOL;  Surgeon: Manya Silvas, MD;  Location[facility] END[facility]COPY;  Service: Endoscopy;  Laterality: N/A;  . ESOPHAGOGASTRODUODENOSCOPY (EGD) WITH PROPOFOL N/A 06/10/2016   Procedure: ESOPHAGOGASTRODUODENOSCOPY (EGD) WITH PROPOFOL;  Surgeon: Manya Silvas, MD;  Location[facility] END[facility]COPY;  Service: Endoscopy;  Laterality: N/A;  . FOOT SURGERY  02/07/1003   right foot benign tumor  . SACROILIAC JOINT INJECTION  08/17/13 & 04/13/14   Dr. Sharlet Salina  . SIGMOIDOSCOPY    . UTERINE FIBROID SURGERY  1997    Gynecologic History:  Patient's last menstrual period was 02/26/2002. Last Pap: 2012 Results were:  no abnormalities  Last mammogram: 3 months ago Results were: BI-RAD II  Obstetric History: No obstetric history on file.  Family History:  Family History  Problem Relation Age of Onset  . Breast cancer Mother        double masectomy  . Arthritis Father   . GER disease Father   . Heart disease Other        paternal uncles  . Colon cancer Neg Hx     Social History:  Social History   Socioeconomic History  . Marital status: Divorced    Spouse name: Not on file  . Number of children: 0  . Years of education: Not on file  . Highest education level: Not on file  Occupational History  . Not on file  Tobacco Use  . Smoking status: Never Smoker  . Smokeless tobacco: Never Used  Substance and Sexual Activity  . Alcohol use: Yes    Alcohol/week: 1.0 standard drink    Types: 1 Glasses of wine per  week    Comment: bimonthly  . Drug use: No  . Sexual activity: Not on file  Other Topics Concern  . Not on file  Social History Narrative  . Not on file   Social Determinants of Health   Financial Resource Strain: Not on file  Food Insecurity: Not on file  Transportation Needs: Not on file  Physical Activity: Not on file  Stress: Not on file  Social Connections: Not on file  Intimate Partner Violence: Not on file    Allergies:  Allergies  Allergen Reactions  . Erythromycin Ethylsuccinate [Erythromycin]   . Omnicef [Cefdinir]     Medications: Prior to Admission medications   Medication Sig Start Date End Date Taking? Authorizing Provider  ALPRAZolam (XANAX) 0.25 MG tablet Take 1 tablet (0.25 mg total) by mouth at bedtime as needed. 06/15/19  Yes Einar Pheasant, MD  Apremilast 30 MG TABS Take 1 tablet by mouth daily.   Yes [provider]  Calcium Citrate (CITRACAL PO) Take by mouth 4 (four) times daily.   Yes [provider]  Crisaborole (EUCRISA) 2 % OINT Apply once  to twice daily as needed 09/08/19  Yes Ralene Bathe, MD  famotidine (PEPCID) 40 MG tablet Take one tablet 30 minutes before evening meal 07/26/19  Yes Einar Pheasant, MD  fluticasone (CUTIVATE) 0.05 % cream Apply 1 application topically as needed.   Yes [provider]  HYDROcodone-acetaminophen (NORCO) 5-325 MG per tablet 1/2 - 1 tablet q 8 hours prn 08/13/13  Yes Rey, Raquel M, NP  ibuprofen (ADVIL,MOTRIN) 200 MG tablet Take 200 mg by mouth every 6 (six) hours as needed.   Yes [provider]  IRON PO Take by mouth.   Yes [provider]  montelukast (SINGULAIR) 10 MG tablet TAKE ONE TABLET BY MOUTH EVERY DAY 12/08/19  Yes Ralene Bathe, MD  Multiple Vitamins-Minerals (MULTIVITAL PO) Take by mouth daily.   Yes [provider]  pantoprazole (PROTONIX) 40 MG tablet Take 1 tablet (40 mg total) by mouth 2 (two) times daily. 01/06/20  Yes Einar Pheasant, MD   estradiol (ESTRACE) 0.1 MG/GM vaginal cream Place 1 Applicatorful vaginally once a week. 03/20/20   Rod Can, CNM  estradiol (ESTRACE) 1 MG tablet Take 1 tablet (1 mg total) by mouth daily. 03/20/20   Rod Can, CNM    Physical Exam Vitals: Blood pressure 114/70, height _0  (1.549 m), weight 139 lb 12.8 oz (63.4 kg), last menstrual period 02/26/2002.  General: NAD HEENT: normocephalic, anicteric Thyroid: no enlargement, no palpable nodules Pulmonary: No increased work of breathing, CTAB Cardiovascular: RRR, distal pulses 2+ Breast: Breast symmetrical, no tenderness, no palpable nodules or masses, no skin or nipple retraction present, no nipple discharge.  No axillary or supraclavicular lymphadenopathy. Abdomen: NABS, soft, non-tender, non-distended.  Umbilicus without lesions.  No hepatomegaly, splenomegaly or masses palpable. No evidence of hernia  Genitourinary:  External: Normal external female genitalia.  Normal urethral meatus, normal Bartholin's and Skene's glands.    Vagina: Normal vaginal mucosa, no evidence of prolapse.    Cervix: Grossly normal in appearance, no bleeding  Uterus: Non-enlarged, mobile, normal contour.  No CMT  Adnexa: ovaries non-enlarged, no adnexal masses  Rectal: deferred  Lymphatic: no evidence of inguinal lymphadenopathy Extremities: no edema, erythema, or tenderness Neurologic: Grossly intact Psychiatric: mood appropriate, affect full     Assessment: 64 y.o. No obstetric history on file. routine annual exam  Plan: Problem List Items Addressed This Visit   None   Visit Diagnoses    Well woman exam with routine gynecological exam    -  Primary   Relevant Medications   estradiol (ESTRACE) 1 MG tablet   Other Relevant Orders   DG Bone Density   Cytology - PAP   At risk for decreased bone density       Relevant Medications   estradiol (ESTRACE) 0.1 MG/GM vaginal cream   Other Relevant Orders   DG Bone Density   Screening for  vaginal cancer       Relevant Orders   Cytology - PAP   Post-menopausal atrophic vaginitis       Relevant Medications   estradiol (ESTRACE) 1 MG tablet   estradiol (ESTRACE) 0.1 MG/GM vaginal cream      1) Mammogram - recommend yearly screening mammogram.  Mammogram Is up to date  2) STI screening  was offered and declined  3) ASCCP guidelines and rationale discussed.  Patient has some hesitation regarding discontinuing PAP smears as she is more comfortable with previous recommendation for annual screening.   4) Osteoporosis: referral for screening sent - per USPTF routine  screening DEXA at age 46  Consider FDA-approved medical therapies in postmenopausal women and men aged 67 years and older, based on the following: a) A hip or vertebral (clinical or morphometric) fracture b) T-score ? -2.5 at the femoral neck or spine after appropriate evaluation to exclude secondary causes C) Low bone mass (T-score between -1.0 and -2.5 at the femoral neck or spine) and a 10-year probability of a hip fracture ? 3% or a 10-year probability of a major osteoporosis-related fracture ? 20% based on the [place]-adapted WHO algorithm   5) Routine healthcare maintenance including cholesterol, diabetes screening discussed managed by PCP  6) Colonoscopy is up to date.  Screening recommended starting at age 63 for average risk individuals, age 68 for individuals deemed at increased risk (including African Americans) and recommended to continue until age 58.  For patient age 52-85 individualized approach is recommended.  Gold standard screening is via colonoscopy, Cologuard screening is an acceptable alternative for patient unwilling or unable to undergo colonoscopy.  "Colorectal cancer screening for average?risk adults: 2018 guideline update from the American Cancer Society"CA: A Cancer Journal for Clinicians: Sep 04, 2016   7) HRT: consider weaning off PO estrace and continue using estrace cream as needed  8)  Return in about 1 year (around 03/20/2021) for annual established gyn.   Christean Leaf, CNM [facility] 03/22/20, 4:13 PM

## 2020-03-24 ENCOUNTER — Encounter: Payer: Self-pay | Admitting: Advanced Practice Midwife

## 2020-03-24 LAB — CYTOLOGY - PAP
Comment: NEGATIVE
Comment: NEGATIVE
HPV 16: NEGATIVE
HPV 18 / 45: NEGATIVE
High risk HPV: POSITIVE — AB

## 2020-04-06 DIAGNOSIS — M1811 Unilateral primary osteoarthritis of first carpometacarpal joint, right hand: Secondary | ICD-10-CM | POA: Diagnosis not present

## 2020-04-08 DIAGNOSIS — U071 COVID-19: Secondary | ICD-10-CM

## 2020-04-08 HISTORY — DX: COVID-19: U07.1

## 2020-04-17 ENCOUNTER — Other Ambulatory Visit: Payer: Self-pay

## 2020-04-17 ENCOUNTER — Ambulatory Visit (INDEPENDENT_AMBULATORY_CARE_PROVIDER_SITE_OTHER): Payer: Medicare PPO | Admitting: Obstetrics and Gynecology

## 2020-04-17 ENCOUNTER — Encounter: Payer: Self-pay | Admitting: Obstetrics and Gynecology

## 2020-04-17 VITALS — BP 128/70 | Ht 61.0 in | Wt 139.4 lb

## 2020-04-17 DIAGNOSIS — R87612 Low grade squamous intraepithelial lesion on cytologic smear of cervix (LGSIL): Secondary | ICD-10-CM | POA: Diagnosis not present

## 2020-04-17 DIAGNOSIS — Z79890 Hormone replacement therapy (postmenopausal): Secondary | ICD-10-CM

## 2020-04-17 DIAGNOSIS — N951 Menopausal and female climacteric states: Secondary | ICD-10-CM

## 2020-04-17 MED ORDER — ESTRADIOL 0.05 MG/24HR TD PTTW: 1.0000 | MEDICATED_PATCH | TRANSDERMAL | 3 refills | Status: DC

## 2020-04-17 NOTE — Patient Instructions (Signed)
Colposcopy, Care After This sheet gives you information about how to care for yourself after your procedure. Your doctor may also give you more specific instructions. If you have problems or questions, contact your doctor. What can I expect after the procedure? If you did not have a sample of your tissue taken out (did not have a biopsy), you may only have some spotting of blood for a few days. You can go back to your normal activities. If you had a sample of your tissue taken out, it is common to have:  Soreness and mild pain. These may last for a few days.  A light-headed feeling.  Mild bleeding or fluid (discharge) coming from your vagina. The fluid will look dark and grainy. You may have this for a few days. The fluid may be caused by a liquid that was used during your procedure. You may need to wear a sanitary pad.  Spotting of blood for at least 48 hours after the procedure. Follow these instructions at home: Medicines  Take over-the-counter and prescription medicines only as told by your doctor.  Ask your doctor what medicines you can start taking again. This is very important if you take blood thinners. Activity  Limit your activity for the first day after your procedure as told by your doctor.  For at least 3 days, or for as long as told by your doctor, avoid: ? Douching. ? Using tampons. ? Having sex.  Return to your normal activities as told by your doctor. Ask your doctor what activities are safe for you. General instructions  Drink enough fluid to keep your pee (urine) pale yellow.  Ask your doctor if you may take baths, swim, or use a hot tub. You may take showers.  If you use birth control (contraception), keep using it.  Keep all follow-up visits as told by your doctor. This is important.   Contact a doctor if:  You get a skin rash. Get help right away if:  You bleed a lot from your vagina. A lot of bleeding means you use more than one pad an hour for 2  hours in a row.  You have clumps of blood (blood clots) coming from your vagina.  You have a fever or chills.  You have signs of infection. This may be fluid coming from your vagina that is: ? Different than normal. ? Yellow. ? Bad-smelling.  You have very bad pain or cramps in your lower belly that do not get better with medicine.  You faint. Summary  If you did not have a sample of your tissue taken out, you may only have some spotting of blood for a few days. You can go back to your normal activities.  If you had a sample of your tissue taken out, it is common to have mild pain for a few days and spotting for 48 hours.  Avoid douching, using tampons, and having sex for at least 3 days after the procedure or for as long as told.  Get help right away if you have a lot of bleeding, very bad pain, or signs of infection. This information is not intended to replace advice given to you by your health care provider. Make sure you discuss any questions you have with your health care provider. Document Revised: 01/25/2020 Document Reviewed: 03/24/2019 Elsevier Patient Education  2021 [employer].

## 2020-04-17 NOTE — Progress Notes (Signed)
Colposcopy

## 2020-04-17 NOTE — Progress Notes (Signed)
   GYNECOLOGY CLINIC COLPOSCOPY PROCEDURE NOTE  65 y.o. No obstetric history on file. here for colposcopy for low-grade squamous intraepithelial neoplasia (LGSIL - encompassing HPV,mild dysplasia,CIN I)  pap smear on 03/20/2020. Discussed underlying role for HPV infection in the development of cervical dysplasia, its natural history and progression/regression, need for surveillance.  Is the patient  pregnant: No LMP: Patient's last menstrual period was 02/26/2002. Smoking status:  reports that she has never smoked. She has never used smokeless tobacco. Contraception: none Future fertility desired:  No  Patient given informed consent, signed copy in the chart, time out was performed.  The patient was position in dorsal lithotomy position. Speculum was placed the cervix was visualized.   After application of acetic acid colposcopic inspection of the cervix was undertaken.   Colposcopy adequate, cervix is surgically absent, although sides of the vaginal cuff do have a cervix-like appearance.  no visible lesions; No biopsies obtained.   ECC specimen obtained:  No  All specimens were labeled and sent to pathology.   Patient was given post procedure instructions.  Will follow up pathology and manage accordingly.  Routine preventative health maintenance measures emphasized.  Repeat pap smear of vaginal cuff in 1-3 years   Discussed HRT. Patient on oral but desires to transition to patch. Rx sent. Will follow up in 4 weeks to see how she is doing with new medication.    Adrian Prows MD [facility] 04/17/2020 1:38 PM

## 2020-04-20 DIAGNOSIS — H52223 Regular astigmatism, bilateral: Secondary | ICD-10-CM | POA: Diagnosis not present

## 2020-04-20 DIAGNOSIS — Z135 Encounter for screening for eye and ear disorders: Secondary | ICD-10-CM | POA: Diagnosis not present

## 2020-04-20 DIAGNOSIS — H2513 Age-related nuclear cataract, bilateral: Secondary | ICD-10-CM | POA: Diagnosis not present

## 2020-04-20 DIAGNOSIS — H5213 Myopia, bilateral: Secondary | ICD-10-CM | POA: Diagnosis not present

## 2020-05-02 ENCOUNTER — Ambulatory Visit: Payer: Medicare PPO | Admitting: Dermatology

## 2020-05-15 ENCOUNTER — Encounter: Payer: Self-pay | Admitting: Obstetrics and Gynecology

## 2020-05-15 ENCOUNTER — Other Ambulatory Visit: Payer: Self-pay

## 2020-05-15 ENCOUNTER — Ambulatory Visit: Payer: Medicare PPO | Admitting: Obstetrics and Gynecology

## 2020-05-15 VITALS — BP 116/70 | Ht 61.0 in | Wt 142.4 lb

## 2020-05-15 DIAGNOSIS — R875 Abnormal microbiological findings in specimens from female genital organs: Secondary | ICD-10-CM

## 2020-05-15 DIAGNOSIS — N898 Other specified noninflammatory disorders of vagina: Secondary | ICD-10-CM | POA: Diagnosis not present

## 2020-05-15 DIAGNOSIS — N760 Acute vaginitis: Secondary | ICD-10-CM

## 2020-05-15 MED ORDER — DESONIDE 0.05 % EX CREA: TOPICAL_CREAM | Freq: Two times a day (BID) | CUTANEOUS | 0 refills | Status: DC

## 2020-05-15 NOTE — Patient Instructions (Addendum)
Hormone replacement therapy is for healthy, peri/postmenopausal women with moderate to severe vasomotor symptoms impacting sleep, quality of life, or ability to function, and who are within 10 years of menopause (or <66 years of age).  Exceptions include women with a history of breast cancer, coronary heart disease (CHD), a previous venous thromboembolic (VTE) event or stroke, active liver disease, or those at high risk for these complications.  Estimated VTE Incidence   54/100,000 per year in women in their 72s  62-122/100,000 per year in women in their 64s  300-400/100,000 per year in women aged 61-80 years.   700/100,000 per year in women in their 44s   Combination estrogen plus progestin hormone therapy (HT) or estrogen therapy (ET) for the management of menopausal symptoms and related disorders is associated with an increased risk of venous thromboembolism. Commonly, a relative increase in risk of twofold to fivefold is cited for HT users.   The use of estrogen alone has been associated with a 1.2-1.5-fold relative risk compared with that of nonusers  Standard recommendations for duration of use are three to five years. However, extended use is sometimes necessary for women with persistent, severe hot flashes.  We no longer use MHT for the prevention of chronic disease (osteoporosis, CHD, or dementia). However, there are some data to suggest that use of estrogen within the first 10 years after clinical menopause may reduce the risks of CHD and mortality.       It seems to [place] that you have your JavaScript disabled on your browser. JavaScript is required in order for our site to behave correctly. Please enable JavaScript to use our site.   This site uses cookies. By continuing to browse this site you are agreeing to our use of cookies. Accept or find out more     Skip to Content Skip to Help        April Santos  ? My Account ? History ? Most Viewed ? Bookmarks ? CME /  MOC / State ? Language ? Help ? Log Out  CME / MOC / State  Log Out     Contents   ? What's New  Practice Changing UpDates  Drug Information  Patient Education  Topics by Specialty  Authors and Editors   Calculators  Drug Interactions  UpToDate Pathways    ? ? ? Menu     Patient education: Menopausal hormone therapy (Beyond the Basics)    Official reprint from UpToDate  www.uptodate.com  2022 UpToDate, Inc. and/or its affiliates. All Rights Reserved.    The content on the UpToDate website is not intended nor recommended as a substitute for medical advice, diagnosis, or treatment. Always seek the advice of your own physician or other qualified health care professional regarding any medical questions or conditions. The use of UpToDate content is governed by the UpToDate Terms of Use. 2022 UpToDate, [place] rights reserved. Patient education: Menopausal hormone therapy (Beyond the Basics) Author: Bonnita Levan, MD Section Editors: Carroll Sage, MD Malva Cogan, MD Deputy Editor: Jarrett Ables, MD All topics are updated as new evidence becomes available and our peer review process is complete. Literature review current through:Jan 2022.This topic last updated:Aug 16, 2019.  INTRODUCTION--Menopause is defined as the time in a woman's life, usually between age 30 and 52 years, when the ovaries stop producing eggs (ovulating) and menstrual periods end. After menopause, a woman can no longer get pregnant. Menopause does not happen suddenly; most women experience several  years of changes in their menstrual periods before they stop completely. During this time (also called the menopausal transition or "perimenopause"), many women also start to have menopausal symptoms. These result from declining levels of estrogen in the body and can include hot flashes, night sweats, mood changes, sleep problems, and vaginal dryness. A woman is said to have  completed menopause once she has gone a full year without having a period. The average age for a woman to stop having periods is 51 years. (See "Patient education: Menopause (Beyond the Basics)".) During the transition to menopause, the ovarian production of estrogen decreases by more than 90 percent. The decrease in ovarian estrogen production is what leads to the typical symptoms of hot flashes, night sweats, and eventually vaginal dryness. Some women also notice dry eyes. Some women experience an increase in anxiety and depression during this transition, especially those who have previously experienced these symptoms. Sleep problems are also common at this time. There are a number of options available to ease the symptoms of menopause, including estrogen and nonhormonal options. This article explains how estrogen works and discusses the risks and benefits of menopausal hormone therapy. Information about non-estrogen treatment options is available separately as well. (See "Patient education: Non-estrogen treatments for menopausal symptoms (Beyond the Basics)".) WHAT IS MENOPAUSAL HORMONE THERAPY?--Menopausal hormone therapy is the term used to describe the two hormones, estrogen and progestin, that are given to relieve bothersome symptoms of menopause. Estrogen is the hormone that relieves the symptoms. Women with a uterus must also take progestin (a progesterone-like hormone) to prevent uterine cancer. This is because estrogen alone can cause the lining of the uterus to overgrow (potentially leading to uterine cancer). Women who have had a hysterectomy do not have a uterus and cannot develop uterine cancer. These women are treated with estrogen alone. Types of estrogen--Estrogen is available in many different forms. For hot flashes, it can be taken as a transdermal patch (worn on the skin), an oral pill, or a "ring" or tablet that is inserted into the vagina. There are also creams and sprays that can be  put on the skin. The preferred estrogen is estradiol. The standard dose of oral estradiol is 0.5 or 1 mg daily by mouth. Lower doses seem to have fewer side effects. Estradiol is the estrogen that is identical to the one the ovary makes throughout reproductive life. Estradiol can be given by mouth, skin patch, or vaginal ring. Estrogen patch--Many experts now prefer treating women with the estradiol patch rather than estrogen pills (because it is associated with a lower risk of blood clots than estrogen pills). A combination estrogen and progestin patch is also available. Some patches need to be replaced every few days, while others are only replaced once a week. Estrogen patches work as well as estrogen pills to increase bone density and treat menopausal symptoms. Women with a uterus who use an estrogen patch must also take a progestin to decrease the risk of uterine cancer. (See 'Types of progestin' below.) Estrogen pill--There are many types of estrogen pills. Estradiol derived from plant sources and is the same estrogen that the ovary makes before menopause. All types of estrogen can help to relieve menopausal symptoms. Combination pills that include both estrogen and progestin are available. (See 'Types of progestin' below.) Low-dose birth control pill--Very low-dose birth control pills are a good option for women in their 35s who have bothersome hot flashes, irregular bleeding, and who still need a reliable form of birth control. Caution  should be used for women over 40 years who are also obese because of the higher risk of blood clots. Birth control pills are generally not recommended for postmenopausal women, because the dose of estrogen is higher than needed to relieve hot flashes. (See "Patient education: Hormonal methods of birth control (Beyond the Basics)" and "Patient education: Menopause (Beyond the Basics)", section on 'Menopause and birth control'.) Vaginal estrogen--Women with  vaginal dryness can also be treated with very low doses of estrogen that treat the dryness but not hot flashes (because the dose is too low to get into the bloodstream). Vaginal estrogen comes in a cream, vaginal ring, or vaginal estrogen tablets. The low-dose vaginal estrogens do not usually require the use of a progestin pill. Vaginal estrogen used to treat dryness is discussed in a separate article. (See "Patient education: Vaginal dryness (Beyond the Basics)".) Types of progestin--Postmenopausal women with a uterus who are treated with estrogen alone have an increased risk of developing uterine cancer and hyperplasia (a precursor to uterine cancer). Taking a second hormone, progestin, minimizes this risk. (See "Patient education: Endometrial cancer diagnosis and staging (Beyond the Basics)".) ?Oral progestins - One commonly prescribed progestin pill is micronized natural progesterone. Other types of synthetic progestin pills (medroxyprogesterone acetate norethindrone, norgestrel) are also available. Natural progesterone has no negative effect on lipids and is a good choice for women with high cholesterol levels. In addition, natural progesterone might have other advantages when compared with medroxyprogesterone acetate. ?Intrauterine progestin - An intrauterine device (IUD) is a form of birth control; one type, the levonorgestrel IUD (brand names: Mirena, Jasper Riling), releases progestin to prevent pregnancy. In some countries, these types of IUDs (using a lower dose of levonorgestrel) are used in menopausal women taking estrogen to minimize the risk of developing uterine cancer. The IUD is not currently approved in the [place] for use in menopausal women; however, if you already have one when you enter perimenopause, your doctor may suggest that you keep it in until after menopause is complete. "Bioidentical" products--Many women have turned to compounded "bioidentical" hormone  therapy as an alternative to conventional hormones for treating symptoms of menopause. "Bioidentical" means that the hormones used for therapy are identical in molecular structure to the hormones produced by the ovaries. "Compounded" means the preparation is mixed in a special compounding pharmacy in order to create a customized dose of hormones in the form of pills, creams, or vaginal suppositories. The quality of these custom compounded products is not regulated by the [place] Food and Drug Administration (FDA), and the dose of hormones can vary from batch to batch. For these reasons, expert groups caution against using them. However, in 2019, an estrogen-progestin pill that is also bioidentical became available; this preparation is not compounded and is approved by the FDA, meaning that it has documented safety and efficacy. This might be a good option for women who prefer not to use more conventional hormone therapies. It has also not been found to cause undesirable side effects such as weight gain or high blood pressure. RISKS AND BENEFITS OF HORMONE THERAPY--The [facility]) was a large study designed to find out if hormone therapy would reduce the risk of heart attacks (coronary heart disease [CHD]) after menopause. The study found that taking estrogen-progestin in combination actually increases the risk of heart attacks, breast cancer, blood clots, and strokes in older postmenopausal women but not in younger postmenopausal women. The results of the estrogen-only study were different. Women who took  estrogen alone had a small increase in the risk of stroke and blood clots, but there was no increased risk of heart attacks. There was a decreased risk of breast cancer with estrogen alone. Heart attacks--The risk of having a heart attack related to use of hormone therapy appears to depend on your age. There is no increased risk of heart attacks related to hormone therapy in women  who: ?Became menopausal less than 10 years before starting hormones or ?Were age 28 to 49 years when they took hormone therapy Other studies since the [facility] also report that hormone therapy does not increase heart attack risk in younger women; some suggest it might even lower the risk slightly. In the [facility], women who become menopausal more than 10 years ago or over age 3 years were at increased risk of having a heart attack related to hormone therapy. Breast cancer--There is a small increased risk of breast cancer in women who took combined estrogen-progestin therapy but not in women who took estrogen alone. The risk of breast cancer in women taking menopause hormone therapy is discussed in detail separately. (See "Menopausal hormone therapy and the risk of breast cancer".) Osteoporotic fracture--The risk of breaking a bone at the hip or spine because of osteoporosis is lower in women who take estrogen-progestin or estrogen alone. However, hormone therapy is not recommended to prevent or treat osteoporosis, because there are bone medicines (called bisphosphonates or denosumab) that are very effective and have fewer serious risks. (See "Patient education: Osteoporosis prevention and treatment (Beyond the Basics)".) Dementia--Among the older women studied in the [facility], there was no improvement in memory or thinking with either estrogen alone or with combined estrogen-progestin but there was an increase in the risk of developing dementia. No increase in dementia risk was seen in the younger menopausal women in the [facility] or in other studies. Some experts think that estrogen treatment might be helpful for preventing dementia if you take it in the earliest years after menopause (although this is not proven); taking it many years after menopause seems to be harmful. Depression--Many women experience anxiety and/or depression during the transition to the menopause. Some studies show that estrogen treatment helps  improve mood and decrease depression. However, some women need to be treated with both estrogen and an antidepressant to feel completely better. Once women reach their postmenopausal years and their hormones are stable, they usually begin to feel better. (See "Patient education: Depression in adults (Beyond the Basics)".) Sleep problems--Many perimenopausal and postmenopausal women have sleep problems. Sometimes this is because they have hot flashes at night that interfere with sleep (night sweats). However, women can have trouble sleeping even if they don't have hot flashes. This can be due to disorders like restless leg syndrome and sleep apnea. Estrogen treatment is very effective for improving sleep in women with night sweats. WHO SHOULD TAKE HORMONE THERAPY?--The most common reason for taking hormone therapy is to treat bothersome menopausal symptoms, such as hot flashes or vaginal dryness. Most experts agree that hormone therapy is safe for healthy women who have menopausal symptoms and are within the first 10 years of the onset of menopause. Some experts recommend that you taper and stop your hormone therapy after four or five years to avoid any increased risk of breast cancer. However, this can be a challenge for many women because the average duration of hot flashes is approximately seven to eight years. If you are using a patch, your doctor or nurse can give you a lower-dose  patch to help you taper the dose. If you are taking pills, one way to do this is to skip one pill per week at first, then continue to gradually decrease the number of pills per week until you are no longer taking any. If menopausal symptoms return as you lower your dose of hormones, you can try hormone therapy alternatives. Some women have to go back on hormone therapy for a while. (See "Patient education: Non-estrogen treatments for menopausal symptoms (Beyond the Basics)".) Who should avoid hormones?--Hormone therapy is  not recommended for women with the following: ?Current or past history of breast cancer ?Coronary heart disease ?A previous blood clot, heart attack, or stroke ?Women at high risk for these complications Women whose symptoms can be treated by nonmedication methods (cool bedroom, light and layered clothing, removing layers if hot flashes occur) or with mild symptoms may not need hormone therapy. Women with breast cancer--Women with breast cancer often experience early menopause due to breast cancer treatments. In these women, estrogen or hormone therapy (by mouth or patch) is not recommended. The hormones could increase the chance of the cancer coming back. Alternatives to hormone therapy are available and are often effective in relieving bothersome menopausal symptoms. These alternatives are discussed in detail in a separate article. (See "Patient education: Non-estrogen treatments for menopausal symptoms (Beyond the Basics)".) WHERE TO GET MORE INFORMATION--Your health care provider is the best source of information for questions and concerns related to your medical problem. This article will be updated as needed on our website ([email or web address]). Related topics for patients, as well as selected articles written for health care professionals, are also available. Some of the most relevant are listed below. Patient level information--UpToDate offers two types of patient education materials. The Basics--The Basics patient education pieces answer the four or five key questions a patient might have about a given condition. These articles are best for patients who want a general overview and who prefer short, easy-to-read materials. Patient education: Menopause (The Basics) Patient education: Sex problems in women (The Basics) Patient education: Atrophic vaginitis (The Basics) Beyond the [facility] the Basics patient education pieces are longer, more sophisticated, and more detailed.  These articles are best for patients who want in-depth information and are comfortable with some medical jargon. Patient education: Menopause (Beyond the Basics) Patient education: Non-estrogen treatments for menopausal symptoms (Beyond the Basics) Patient education: Hormonal methods of birth control (Beyond the Basics) Patient education: Vaginal dryness (Beyond the Basics) Patient education: Endometrial cancer diagnosis and staging (Beyond the Basics) Patient education: Gallstones (Beyond the Basics) Patient education: Osteoporosis prevention and treatment (Beyond the Basics) Patient education: Screening for colorectal cancer (Beyond the Basics) Patient education: Depression in adults (Beyond the Basics)  Professional level information--Professional level articles are designed to keep doctors and other health professionals up-to-date on the latest medical findings. These articles are thorough, long, and complex, and they contain multiple references to the research on which they are based. Professional level articles are best for people who are comfortable with a lot of medical terminology and who want to read the same materials their doctors are reading. Overview of androgen deficiency and therapy in women Genitourinary syndrome of menopause (vulvovaginal atrophy): Clinical manifestations and diagnosis Estrogen and cognitive function Menopausal hot flashes Menopausal hormone therapy and cardiovascular risk Menopausal hormone therapy and the risk of breast cancer Menopausal hormone therapy in the prevention and treatment of osteoporosis Menopausal hormone therapy: Benefits and risks Preparations for menopausal hormone therapy Treatment of menopausal symptoms with hormone  therapy Genitourinary syndrome of menopause (vulvovaginal atrophy): Treatment  The following organization also provides reliable health information. ?[employer]) Meno[employer] mobile  app ([email or web address]) Use of UpToDate is subject to the Subscription and License Agreement. Topic 2158 Version 23.0 Contributor Disclosures Bonnita Levan, MDNo relevant financial relationship(s) with ineligible companies to disclose.Carroll Sage, MDGrant/Research/Clinical Trial Support: [employer] [Hypogonadism]; [employer] [Cushing's]; [employer] [Cushing's]; [employer] [Acromegaly]. Consultant/Advisory Boards: [employer] [Hypogonadism]; [employer] [Cushing's]; [employer] [Acromegaly]; [employer] [Cushing's]. All of the relevant financial relationships listed have been mitigated.Malva Cogan, MDEquity Ownership/Stock Options: Janeal Holmes [Endocrinology]. Consultant/Advisory Boards: Civil engineer, contracting [Endocrinology]. All of the relevant financial relationships listed have been mitigated.Jarrett Ables, MDNo relevant financial relationship(s) with ineligible companies to disclose. Contributor disclosures are reviewed for conflicts of interest by the editorial group. When found, these are addressed by vetting through a multi-level review process, and through requirements for references to be provided to support the content. Appropriately referenced content is required of all authors and must conform to UpToDate standards of evidence. Conflict of interest policy Print Options Print   Back []  Text []  Contributor Disclosures  Close      2022 UpToDate, Inc. and/or its affiliates. All Rights Reserved.  Language  Haematologist and License Agreement  Policies  Support Tag   Contact us  About [place]  UpToDate News  Mobile Access  Help & Training  Hurt  Emmi  Facts & Comparisons  Lexicomp  Medi-Span   Loading Please wait  Please wait                 Fast Features

## 2020-05-15 NOTE — Progress Notes (Signed)
Patient ID: April Santos, female   DOB: 1955/08/14, 65 y.o.   MRN: [id number]  Reason for Consult: Gynecologic Exam   Referred by Einar Pheasant, MD  Subjective:     HPI:  April Santos is a 65 y.o. female.  She presents today for a follow-up visit regarding hormone replacement therapy.  She had previously spoken to me during her colposcopy regarding her hormone replacement therapy.  At that time the patient was taking an oral estrogen medication.  And I sent a prescription to transition to her to a transdermal patch.  We discussed at that time risks associated with prolonged estrogen administration including risks of dementia and thrombosis which are thought to increase as the as patient's age.  Patient reports that when she went home she read online more concerning facts regarding extended use of hormone replacement therapy and decided that it would best to stop the medication without tapering.  She reports that she did this and began having immediate issues with sleep disturbances and hot flashes.  Patient reports one hot flash which lasted nearly 2 days.  She reports that after 2 weeks of suffering with sleep disturbances and barely sleeping she decided to restart the Vivelle dot transdermal patch.  She reports that since starting this she has been feeling better.  Although she has noticed increased labial sensitivity and reports that her labia hurt even with touching and rash rubbing against her underwear.  She has been using a topical estrogen ointment.  It was recommended to use this once a week but she has been using it 3 times a week.  She also reports applying vagasil to the labia and that this might of irritated her vulva.  She currently feels undecided if she desires to continue hormone replacement therapy or try a taper.  Past Medical History:  Diagnosis Date  . Arthritis   . Colon polyps   . Disc disorder   . Dysphagia   . GERD (gastroesophageal reflux disease)   . History  of uterine fibroid   . Psoriasis    Family History  Problem Relation Age of Onset  . Breast cancer Mother        double masectomy  . Arthritis Father   . GER disease Father   . Heart disease Other        paternal uncles  . Colon cancer Neg Hx    Past Surgical History:  Procedure Laterality Date  . ABDOMINAL HYSTERECTOMY    . BREAST SURGERY  1999   biopsy  . carpal  2005   Tunnel right hand  . COLONOSCOPY WITH ESOPHAGOGASTRODUODENOSCOPY (EGD)    . COLONOSCOPY WITH PROPOFOL N/A 06/10/2016   Procedure: COLONOSCOPY WITH PROPOFOL;  Surgeon: Manya Silvas, MD;  Location: [facility] ENDOSCOPY;  Service: Endoscopy;  Laterality: N/A;  . ESOPHAGOGASTRODUODENOSCOPY (EGD) WITH PROPOFOL N/A 06/10/2016   Procedure: ESOPHAGOGASTRODUODENOSCOPY (EGD) WITH PROPOFOL;  Surgeon: Manya Silvas, MD;  Location: [facility] ENDOSCOPY;  Service: Endoscopy;  Laterality: N/A;  . FOOT SURGERY  02/07/1003   right foot benign tumor  . SACROILIAC JOINT INJECTION  08/17/13 & 04/13/14   Dr. Sharlet Salina  . SIGMOIDOSCOPY    . UTERINE FIBROID SURGERY  1997    Short Social History:  Social History   Tobacco Use  . Smoking status: Never Smoker  . Smokeless tobacco: Never Used  Substance Use Topics  . Alcohol use: Yes    Alcohol/week: 1.0 standard drink    Types: 1 Glasses of wine per  week    Comment: bimonthly    Allergies  Allergen Reactions  . Erythromycin Ethylsuccinate [Erythromycin]   . Omnicef [Cefdinir]     Current Outpatient Medications  Medication Sig Dispense Refill  . ALPRAZolam (XANAX) 0.25 MG tablet Take 1 tablet (0.25 mg total) by mouth at bedtime as needed. 30 tablet 0  . Apremilast 30 MG TABS Take 1 tablet by mouth daily.    . Calcium Citrate (CITRACAL PO) Take by mouth 4 (four) times daily.    [name] (EUCRISA) 2 % OINT Apply once to twice daily as needed 60 g 3  . estradiol (ESTRACE) 0.1 MG/GM vaginal cream Place 1 Applicatorful vaginally once a week. 42.5 g 2  . HYDROcodone-acetaminophen  (NORCO) 5-325 MG per tablet 1/2 - 1 tablet q 8 hours prn 10 tablet 0  . ibuprofen (ADVIL,MOTRIN) 200 MG tablet Take 200 mg by mouth every 6 (six) hours as needed.    . meloxicam (MOBIC) 15 MG tablet Take by mouth.    . Multiple Vitamins-Minerals (MULTIVITAL PO) Take by mouth daily.    . pantoprazole (PROTONIX) 40 MG tablet Take 1 tablet (40 mg total) by mouth 2 (two) times daily. 180 tablet 1  . estradiol (VIVELLE-DOT) 0.05 MG/24HR patch Place 1 patch (0.05 mg total) onto the skin 2 (two) times a week. (Patient not taking: Reported on 05/15/2020) 12 patch 3  . famotidine (PEPCID) 40 MG tablet Take one tablet 30 minutes before evening meal (Patient not taking: Reported on 05/15/2020) 30 tablet 2  . fluticasone (CUTIVATE) 0.05 % cream Apply 1 application topically as needed. (Patient not taking: Reported on 05/15/2020)    . IRON PO Take by mouth. (Patient not taking: Reported on 05/15/2020)    . montelukast (SINGULAIR) 10 MG tablet TAKE ONE TABLET BY MOUTH EVERY DAY (Patient not taking: Reported on 05/15/2020) 30 tablet 1   No current facility-administered medications for this visit.    Review of Systems  Constitutional: Negative for chills, fatigue, fever and unexpected weight change.  HENT: Negative for trouble swallowing.  Eyes: Negative for loss of vision.  Respiratory: Negative for cough, shortness of breath and wheezing.  Cardiovascular: Negative for chest pain, leg swelling, palpitations and syncope.  GI: Negative for abdominal pain, blood in stool, diarrhea, nausea and vomiting.  GU: Negative for difficulty urinating, dysuria, frequency and hematuria.  Musculoskeletal: Negative for back pain, leg pain and joint pain.  Skin: Negative for rash.  Neurological: Negative for dizziness, headaches, light-headedness, numbness and seizures.  Psychiatric: Negative for behavioral problem, confusion, depressed mood and sleep disturbance.        Objective:  Objective   Vitals:   05/15/20 1505  BP:  116/70  Weight: 142 lb 6.4 oz (64.6 kg)  Height: 5\' 1"  (1.549 m)   Body mass index is 26.91 kg/m.  Physical Exam Vitals and nursing note reviewed.  Constitutional:      Appearance: She is well-developed and well-nourished.  HENT:     Head: Normocephalic and atraumatic.  Eyes:     Extraocular Movements: EOM normal.     Pupils: Pupils are equal, round, and reactive to light.  Cardiovascular:     Rate and Rhythm: Normal rate and regular rhythm.  Pulmonary:     Effort: Pulmonary effort is normal. No respiratory distress.  Genitourinary:    Comments: External: Normal appearing vulva. No lesions noted.  Speculum examination: Normal appearing cervix. No blood in the vaginal vault. Scant white discharge.   Skin:  General: Skin is warm and dry.  Neurological:     Mental Status: She is alert and oriented to person, place, and time.  Psychiatric:        Mood and Affect: Mood and affect normal.        Behavior: Behavior normal.        Thought Content: Thought content normal.        Judgment: Judgment normal.    Wet Prep: Clue Cells: Negative Fungal elements: Negative Trichomonas: Negative   Assessment/Plan:     65 year old on hormone replacement therapy  We had a discussion regarding the risk benefits of hormone replacement therapy and decisions regarding tapering.  Would recommend the patient taper her transdermal patch over a 3 to 71-month time.  And discuss changing the dose of the transdermal patch every 3 months if she does desire to take with this medication.  Provide the patient with handouts from a cog and up-to-date regarding hormone replacement therapy is present times.  Patient will review and consider.  Acute vaginitis and labial sensitivity.  Microscopic examination of vaginal discharge is normal.  Will send bacterial vaginosis and yeast new swab for confirmation of lack of infection.  Would start patient on topical desonide ointment 1-2 times a day for 1 to 2 weeks.   Could taper to once a week if her symptoms improve.  Recommended following up if symptoms of vulvar pain have not resolved within 2 to 4 weeks. Discussed soaking in warm water for 10-15 minutes to help with vulvar pain.   More than 30 minutes were spent face to face with the patient in the room, reviewing the medical record, labs and images, and coordinating care for the patient. The plan of management was discussed in detail and counseling was provided.     Adrian Prows MD [facility], [facility] 05/15/2020 3:14 PM

## 2020-05-17 LAB — NUSWAB BV AND CANDIDA, NAA
Candida albicans, NAA: NEGATIVE
Candida glabrata, NAA: NEGATIVE

## 2020-05-18 IMAGING — US US ABDOMEN COMPLETE
1 series · 14 of 25 positions shown · non-contrast
Comparison: None.

CLINICAL DATA: Chest and abdominal pain.

EXAM:
ABDOMEN ULTRASOUND COMPLETE

[Series 1: us abdomen complete · 0.22mm/px · 14 of 86 slices shown]
[im 1/86]
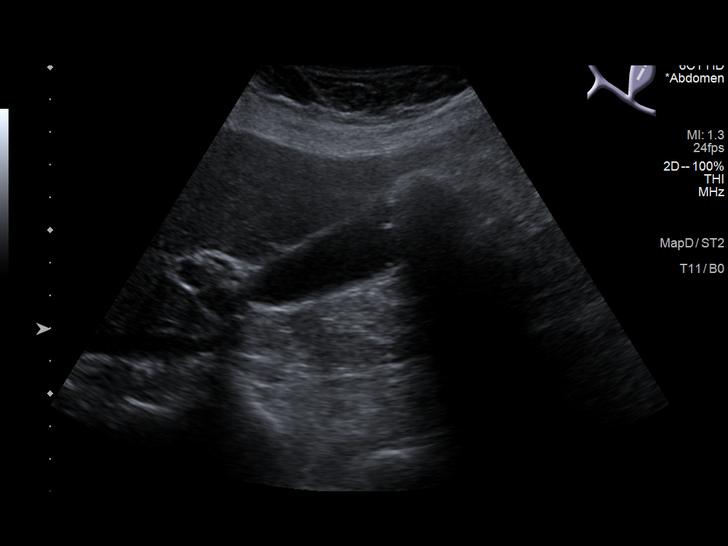
[im 8/86]
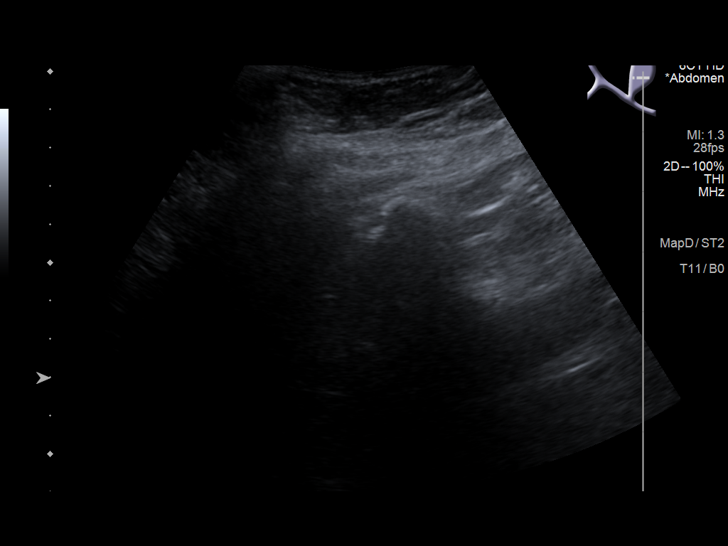
[im 15/86]
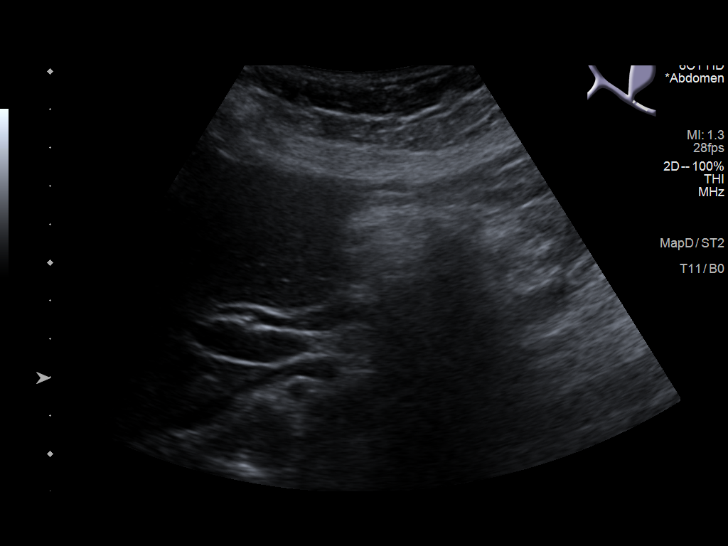
[im 22/86]
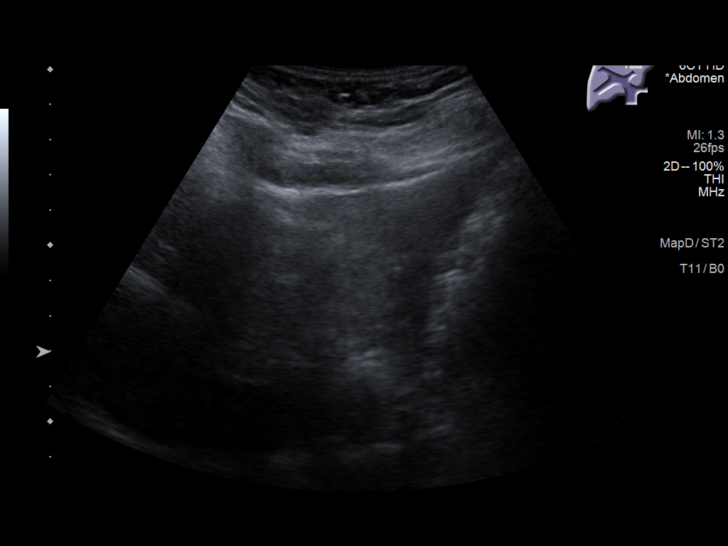
[im 29/86]
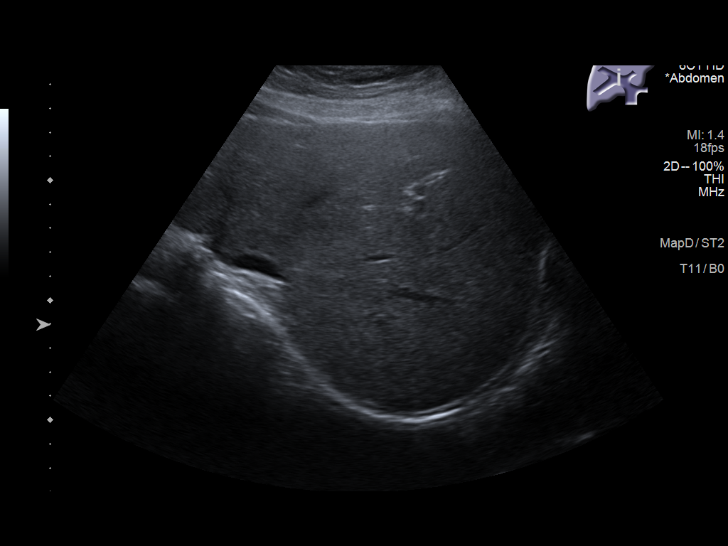
[im 32/86]
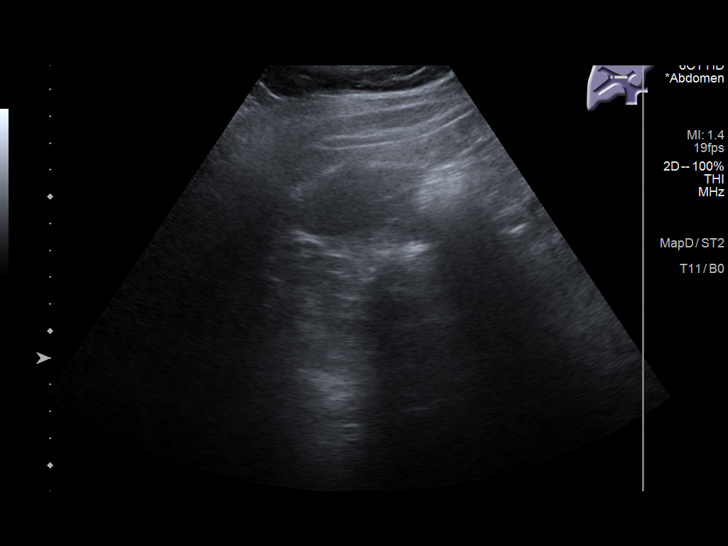
[im 39/86]
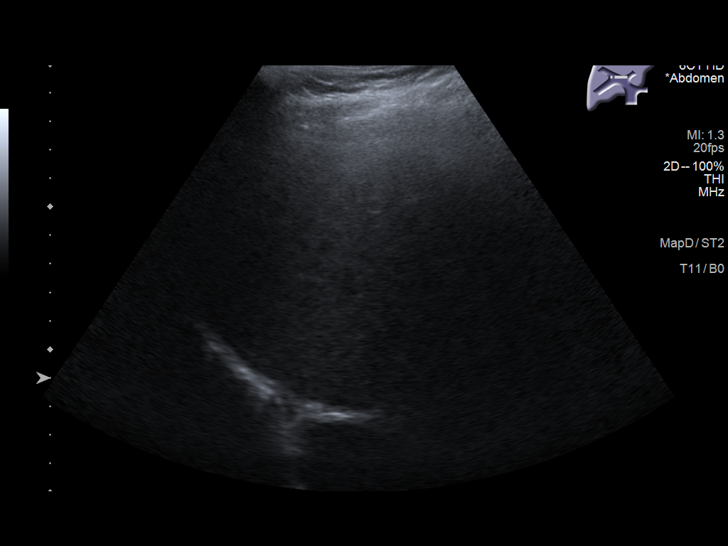
[im 47/86]
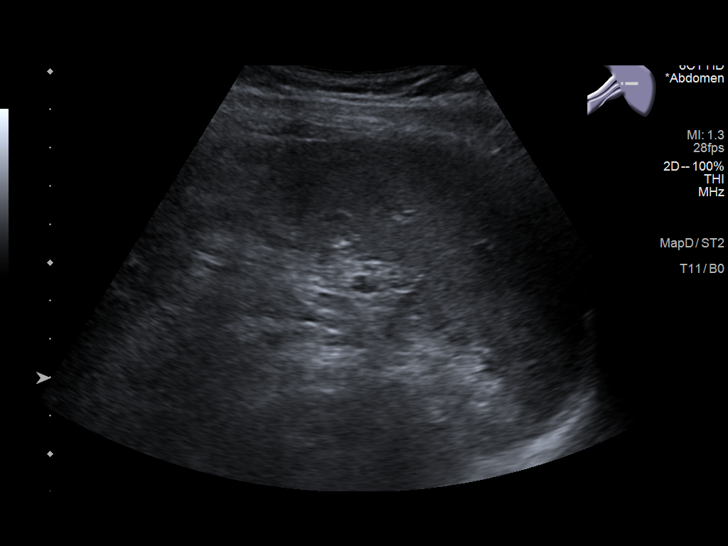
[im 54/86]
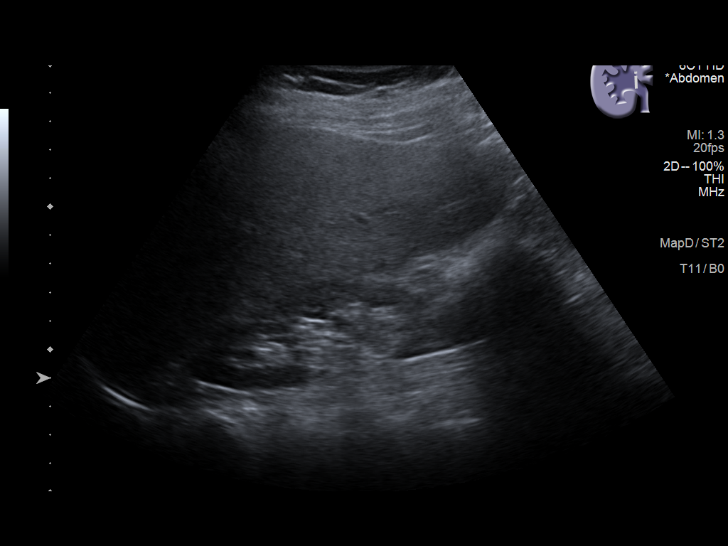
[im 57/86]
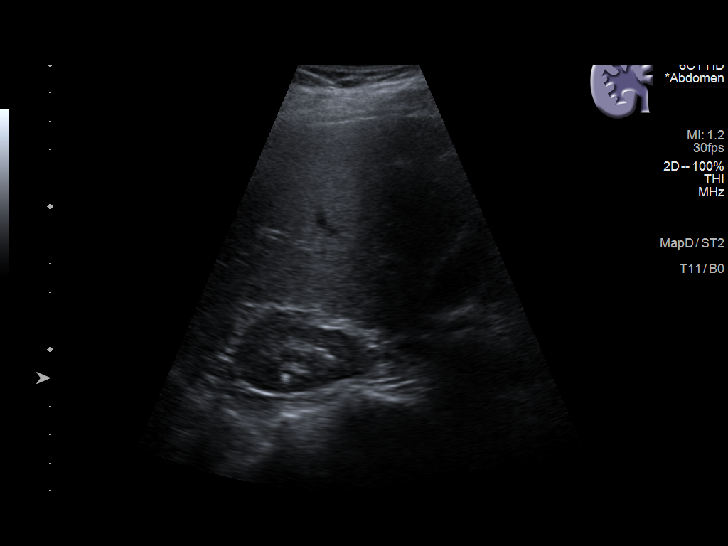
[im 64/86]
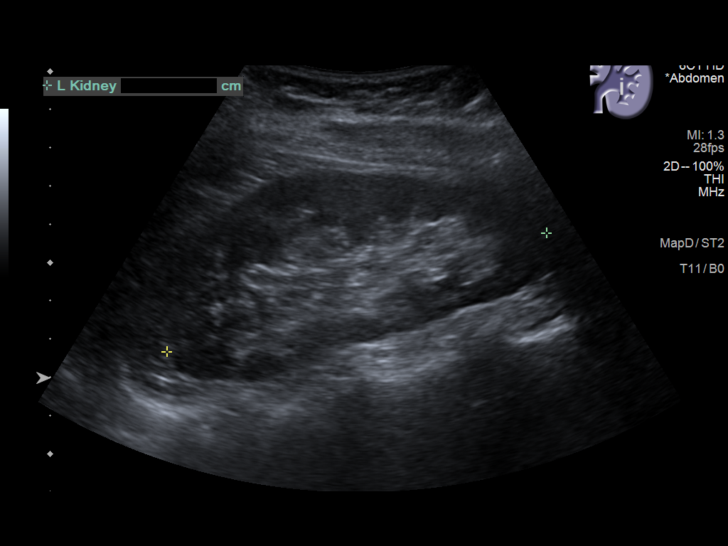
[im 71/86]
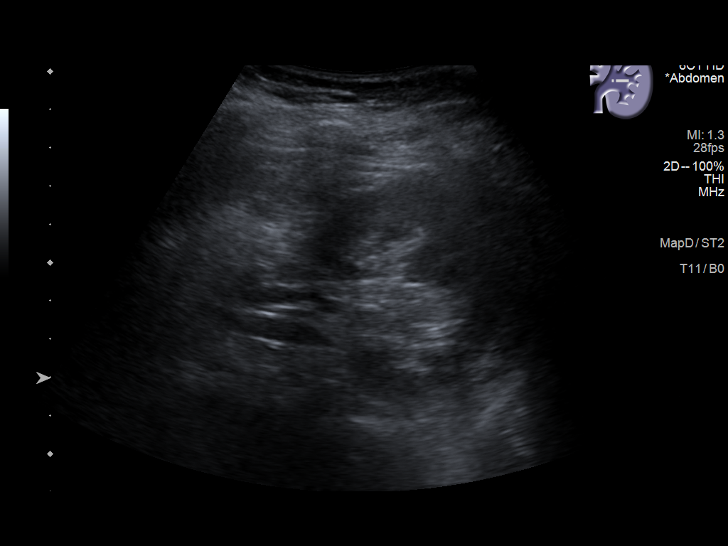
[im 78/86]
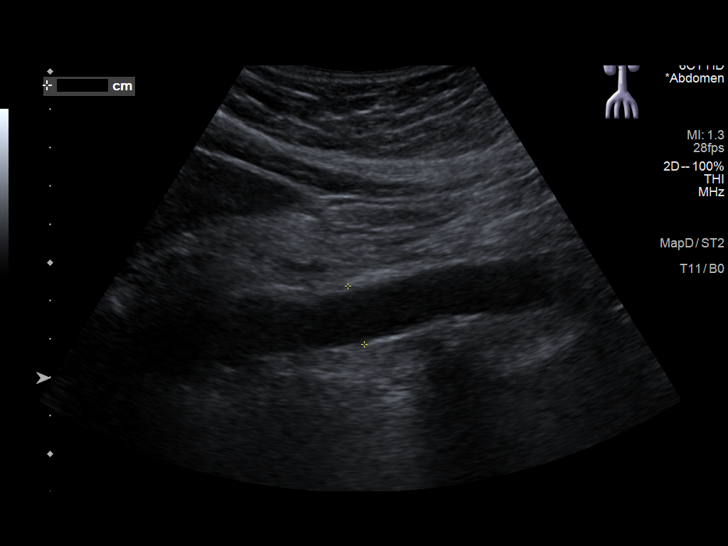
[im 86/86]
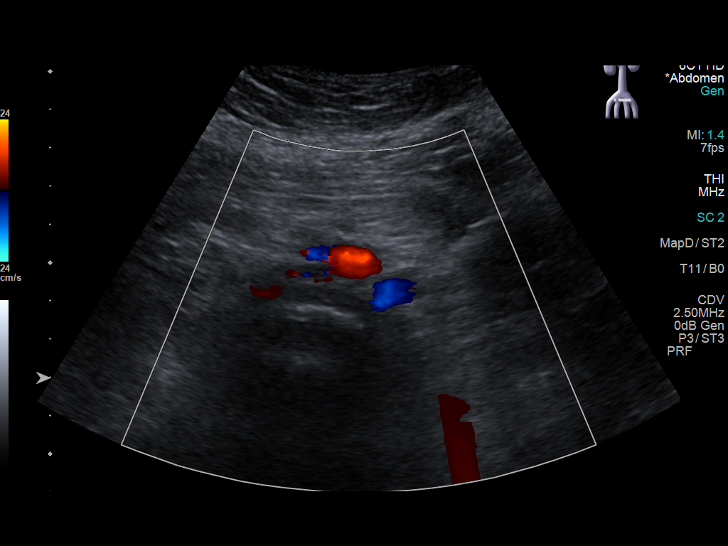

[14 of 25 positions shown; findings below may reference images not displayed]

FINDINGS: Gallbladder: Cholelithiasis with 2.4 cm stone present over the
fundus. Negative sonographic Murphy sign. No significant gallbladder
wall thickening or adjacent free fluid.

Common bile duct: Diameter: 3.3 mm.

Liver: Minimal increased parenchymal echogenicity compatible with
mild steatosis without focal mass. Portal vein is patent on color
Doppler imaging with normal direction of blood flow towards the
liver.

IVC: No abnormality visualized.

Pancreas: Visualized portion unremarkable.

Spleen: Size and appearance within normal limits.

Right Kidney: Length: 10.3 cm. Echogenicity within normal limits. No
mass or hydronephrosis visualized.

Left Kidney: Length: 10.4 cm. Echogenicity within normal limits. No
mass or hydronephrosis visualized.

Abdominal aorta: No aneurysm visualized.

Other findings: None.
IMPRESSION: 1. Mild cholelithiasis without additional sonographic evidence to
suggest cholecystitis.

2.  Minimal hepatic steatosis without focal mass.

## 2020-05-22 ENCOUNTER — Other Ambulatory Visit: Payer: Self-pay

## 2020-05-22 ENCOUNTER — Ambulatory Visit: Payer: Medicare PPO | Admitting: Dermatology

## 2020-05-22 DIAGNOSIS — L821 Other seborrheic keratosis: Secondary | ICD-10-CM | POA: Diagnosis not present

## 2020-05-22 DIAGNOSIS — D489 Neoplasm of uncertain behavior, unspecified: Secondary | ICD-10-CM

## 2020-05-22 DIAGNOSIS — D485 Neoplasm of uncertain behavior of skin: Secondary | ICD-10-CM

## 2020-05-22 DIAGNOSIS — L409 Psoriasis, unspecified: Secondary | ICD-10-CM | POA: Diagnosis not present

## 2020-05-22 DIAGNOSIS — D225 Melanocytic nevi of trunk: Secondary | ICD-10-CM

## 2020-05-22 DIAGNOSIS — D3617 Benign neoplasm of peripheral nerves and autonomic nervous system of trunk, unspecified: Secondary | ICD-10-CM | POA: Diagnosis not present

## 2020-05-22 NOTE — Progress Notes (Signed)
Follow-Up Visit   Subjective  April Santos is a 65 y.o. female who presents for the following: 6 month follow up  (Patient states she is here to have area on right side of chest. She states she would like to have removed. ) and Psoriasis (Patient with psoriasis at arms and legs. She is taking otezla 30 mg twice daily and using eucrisa ointment . Patient tolerating otezla well. ).  The following portions of the chart were reviewed this encounter and updated as appropriate:  Tobacco   Allergies   Meds   Problems   Med Hx   Surg Hx   Fam Hx       Objective  Well appearing patient in no apparent distress; mood and affect are within normal limits.  A focused examination was performed including right chest, bilateral legs, bilateral arms . Relevant physical exam findings are noted in the Assessment and Plan.  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  Epidermal / dermal shaving  Lesion diameter (cm):  0.4 Informed consent: discussed and consent obtained   Timeout: patient name, date of birth, surgical site, and procedure verified   Procedure prep:  Patient was prepped and draped in usual sterile fashion Prep type:  Isopropyl alcohol Anesthesia: the lesion was anesthetized in a standard fashion   Anesthetic:  1% lidocaine w/ epinephrine 1-100,000 buffered w/ 8.4% NaHCO3 Instrument used: flexible razor blade   Hemostasis achieved with: pressure, aluminum chloride and electrodesiccation   Outcome: patient tolerated procedure well   Post-procedure details: sterile dressing applied and wound care instructions given   Dressing type: bandage and petrolatum    Specimen 1 - Surgical pathology Differential  Diagnosis: Irritated vs dysplastic nevus    Check Margins: No 0.4 cm brown papule  right lateral breast inferior  Epidermal / dermal shaving  Lesion diameter (cm):  0.6 Informed consent: discussed and consent obtained   Timeout: patient name, date of birth, surgical site, and procedure verified   Procedure prep:  Patient was prepped and draped in usual sterile fashion Prep type:  Isopropyl alcohol Anesthesia: the lesion was anesthetized in a standard fashion   Anesthetic:  1% lidocaine w/ epinephrine 1-100,000 buffered w/ 8.4% NaHCO3 Instrument used: flexible razor blade   Hemostasis achieved with: pressure, aluminum chloride and electrodesiccation   Outcome: patient tolerated procedure well   Post-procedure details: sterile dressing applied and wound care instructions given   Dressing type: bandage and petrolatum    Specimen 2 - Surgical pathology Differential Diagnosis: Irritated vs dysplastic nevus   Check Margins: No 0.6 cm brown papules  Irritated vs dysplastic nevus  Irritated vs dysplastic nevus   Psoriasis bilateral feet, bilateral elbows Patient is doing well on Otezla 30 mg. Without side effects. Continue using Crisaborole 2 % ointment as needed to affected areas as needed.  Psoriasis is a chronic non-curable, but treatable genetic/hereditary disease that may have other systemic features affecting other organ systems such as joints (Psoriatic Arthritis). It is associated with an increased risk of inflammatory bowel disease, heart disease, non-alcoholic fatty liver disease, and depression.   Other Related Medications Crisaborole (EUCRISA) 2 % OINT   Seborrheic Keratoses Left thigh,  left hand  Benign-appearing.  Observation.  Call clinic for new or changing lesions.  Recommend daily use of broad spectrum spf 30+ sunscreen to sun-exposed areas.  - Stuck-on, waxy, tan-brown papules and plaques  - Discussed benign etiology and prognosis. - Observe - Call for any  changes  Return in about 6 months (around 11/19/2020) for keep follow up as scheduled 3/16 plan to do isk of left leg and left hand, 6 month follow psoriasis.  IRuthell Rummage, CMA, am acting as scribe for Sarina Ser, MD.  Documentation: I have reviewed the above documentation for accuracy and completeness, and I agree with the above.  Sarina Ser, MD

## 2020-05-22 NOTE — Patient Instructions (Addendum)
Biopsy Wound Care Instructions  1. Leave the original bandage on for 24 hours if possible.  If the bandage becomes soaked or soiled before that time, it is OK to remove it and examine the wound.  A small amount of post-operative bleeding is normal.  If excessive bleeding occurs, remove the bandage, place gauze over the site and apply continuous pressure (no peeking) over the area for 30 minutes. If this does not work, please call our clinic as soon as possible or page your doctor if it is after hours.   2. Once a day, cleanse the wound with soap and water. It is fine to shower. If a thick crust develops you may use a Q-tip dipped into dilute hydrogen peroxide (mix 1:1 with water) to dissolve it.  Hydrogen peroxide can slow the healing process, so use it only as needed.    3. After washing, apply petroleum jelly (Vaseline) or an antibiotic ointment if your doctor prescribed one for you, followed by a bandage.    4. For best healing, the wound should be covered with a layer of ointment at all times. If you are not able to keep the area covered with a bandage to hold the ointment in place, this may mean re-applying the ointment several times a day.  Continue this wound care until the wound has healed and is no longer open.   Itching and mild discomfort is normal during the healing process. However, if you develop pain or severe itching, please call our office.   If you have any discomfort, you can take Tylenol (acetaminophen) or ibuprofen as directed on the bottle. (Please do not take these if you have an allergy to them or cannot take them for another reason).  Some redness, tenderness and white or yellow material in the wound is normal healing.  If the area becomes very sore and red, or develops a thick yellow-green material (pus), it may be infected; please notify us.    If you have stitches, return to clinic as directed to have the stitches removed. You will continue wound care for 2-3 days after  the stitches are removed.   Wound healing continues for up to one year following surgery. It is not unusual to experience pain in the scar from time to time during the interval.  If the pain becomes severe or the scar thickens, you should notify the office.    A slight amount of redness in a scar is expected for the first six months.  After six months, the redness will fade and the scar will soften and fade.  The color difference becomes less noticeable with time.  If there are any problems, return for a post-op surgery check at your earliest convenience.  To improve the appearance of the scar, you can use silicone scar gel, cream, or sheets (such as Mederma or Serica) every night for up to one year. These are available over the counter (without a prescription).  Please call our office at [phone] for any questions or concerns.   Side effects of Otezla (apremilast) include diarrhea, nausea, headache, upper respiratory infection, depression, and weight decrease (5-10%). It should only be taken by pregnant women after a discussion regarding risks and benefits with their doctor. Goal is control of skin condition, not cure.  The use of [name] requires long term medication management, including periodic office visits.

## 2020-05-23 ENCOUNTER — Encounter: Payer: Self-pay | Admitting: Dermatology

## 2020-05-29 ENCOUNTER — Telehealth: Payer: Self-pay

## 2020-05-29 NOTE — Telephone Encounter (Signed)
LM on VM please return my call  

## 2020-05-29 NOTE — Telephone Encounter (Signed)
-----   Message from Ralene Bathe, MD sent at 05/25/2020  2:12 PM EST ----- Diagnosis 1. Skin , right lateral breast superior MELANOCYTIC NEVUS, INTRADERMAL TYPE, IRRITATED 2. Skin , right lateral breast inferior NEUROFIBROMA, IRRITATED  1- benign irritated mole 2- benign irritated neurofibroma (similar to "mole")  No further treatment needed

## 2020-06-08 ENCOUNTER — Telehealth: Payer: Self-pay | Admitting: Internal Medicine

## 2020-06-08 NOTE — Telephone Encounter (Signed)
Left message for patient to call back and schedule Medicare Annual Wellness Visit (AWV)   This should be a virtual visit only=30 minutes.  No hx of AWV; please schedule at anytime with Denisa O'Brien-Blaney at [facility]  AWV-I PER PALMETTO AS OF 05/09/2020

## 2020-06-09 ENCOUNTER — Telehealth: Payer: Self-pay

## 2020-06-09 NOTE — Telephone Encounter (Signed)
Left message to advise patient of results/hd

## 2020-06-09 NOTE — Telephone Encounter (Signed)
-----   Message from Ralene Bathe, MD sent at 05/25/2020  2:12 PM EST ----- Diagnosis 1. Skin , right lateral breast superior MELANOCYTIC NEVUS, INTRADERMAL TYPE, IRRITATED 2. Skin , right lateral breast inferior NEUROFIBROMA, IRRITATED  1- benign irritated mole 2- benign irritated neurofibroma (similar to "mole")  No further treatment needed

## 2020-06-21 ENCOUNTER — Ambulatory Visit: Payer: Medicare PPO | Admitting: Dermatology

## 2020-06-29 ENCOUNTER — Encounter: Payer: Self-pay | Admitting: Internal Medicine

## 2020-06-30 ENCOUNTER — Ambulatory Visit: Payer: Medicare PPO

## 2020-07-04 ENCOUNTER — Ambulatory Visit (INDEPENDENT_AMBULATORY_CARE_PROVIDER_SITE_OTHER): Payer: Medicare PPO

## 2020-07-04 VITALS — Ht 61.0 in | Wt 142.0 lb

## 2020-07-04 DIAGNOSIS — Z0000 Encounter for general adult medical examination without abnormal findings: Secondary | ICD-10-CM | POA: Diagnosis not present

## 2020-07-04 NOTE — Progress Notes (Signed)
Subjective:   April Santos is a 65 y.o. female who presents for an Initial Medicare Annual Wellness Visit.  Review of Systems    No ROS.  Medicare Wellness Virtual Visit.    Cardiac Risk Factors include: advanced age (>96men, >62 women)     Objective:    Today's Vitals   07/04/20 1237  Weight: 142 lb (64.4 kg)  Height: 5\' 1"  (1.549 m)   Body mass index is 26.83 kg/m.  Advanced Directives 07/04/2020 06/10/2016  Does Patient Have a Medical Advance Directive? Yes No  Type of Paramedic of [name];Living will -  Does patient want to make changes to medical advance directive? No - Patient declined -  Copy of April Santos in Chart? No - copy requested -    Current Medications (verified) Outpatient Encounter Medications as of 07/04/2020  Medication Sig  . ALPRAZolam (XANAX) 0.25 MG tablet Take 1 tablet (0.25 mg total) by mouth at bedtime as needed.  Marland Kitchen Apremilast 30 MG TABS Take 1 tablet by mouth daily.  . Calcium Citrate (CITRACAL PO) Take by mouth 4 (four) times daily.  [name] (EUCRISA) 2 % OINT Apply once to twice daily as needed  . desonide (DESOWEN) 0.05 % cream Apply topically 2 (two) times daily.  Marland Kitchen estradiol (ESTRACE) 0.1 MG/GM vaginal cream Place 1 Applicatorful vaginally once a week.  . estradiol (VIVELLE-DOT) 0.05 MG/24HR patch Place 1 patch (0.05 mg total) onto the skin 2 (two) times a week.  . famotidine (PEPCID) 40 MG tablet Take one tablet 30 minutes before evening meal  . HYDROcodone-acetaminophen (NORCO) 5-325 MG per tablet 1/2 - 1 tablet q 8 hours prn  . ibuprofen (ADVIL,MOTRIN) 200 MG tablet Take 200 mg by mouth every 6 (six) hours as needed.  . meloxicam (MOBIC) 15 MG tablet Take by mouth.  . Multiple Vitamins-Minerals (MULTIVITAL PO) Take by mouth daily.  . pantoprazole (PROTONIX) 40 MG tablet Take 1 tablet (40 mg total) by mouth 2 (two) times daily.   No facility-administered encounter medications on file as  of 07/04/2020.    Allergies (verified) Erythromycin ethylsuccinate [erythromycin] and Omnicef [cefdinir]   History: Past Medical History:  Diagnosis Date  . Arthritis   . Colon polyps   . Disc disorder   . Dysphagia   . GERD (gastroesophageal reflux disease)   . History of uterine fibroid   . Psoriasis    Past Surgical History:  Procedure Laterality Date  . ABDOMINAL HYSTERECTOMY    . BREAST SURGERY  1999   biopsy  . carpal  2005   Tunnel right hand  . COLONOSCOPY WITH ESOPHAGOGASTRODUODENOSCOPY (EGD)    . COLONOSCOPY WITH PROPOFOL N/A 06/10/2016   Procedure: COLONOSCOPY WITH PROPOFOL;  Surgeon: Manya Silvas, MD;  Location: [facility] ENDOSCOPY;  Service: Endoscopy;  Laterality: N/A;  . ESOPHAGOGASTRODUODENOSCOPY (EGD) WITH PROPOFOL N/A 06/10/2016   Procedure: ESOPHAGOGASTRODUODENOSCOPY (EGD) WITH PROPOFOL;  Surgeon: Manya Silvas, MD;  Location: [facility] ENDOSCOPY;  Service: Endoscopy;  Laterality: N/A;  . FOOT SURGERY  02/07/1003   right foot benign tumor  . SACROILIAC JOINT INJECTION  08/17/13 & 04/13/14   Dr. Sharlet Salina  . SIGMOIDOSCOPY    . UTERINE FIBROID SURGERY  1997   Family History  Problem Relation Age of Onset  . Breast cancer Mother        double masectomy  . Arthritis Father   . GER disease Father   . Heart disease Other  paternal uncles  . Colon cancer Neg Hx    Social History   Socioeconomic History  . Marital status: Divorced    Spouse name: Not on file  . Number of children: 0  . Years of education: Not on file  . Highest education level: Not on file  Occupational History  . Not on file  Tobacco Use  . Smoking status: Never Smoker  . Smokeless tobacco: Never Used  Substance and Sexual Activity  . Alcohol use: Yes    Alcohol/week: 1.0 standard drink    Types: 1 Glasses of wine per week    Comment: bimonthly  . Drug use: No  . Sexual activity: Yes  Other Topics Concern  . Not on file  Social History Narrative  . Not on file   Social  Determinants of Health   Financial Resource Strain: Low Risk   . Difficulty of Paying Living Expenses: Not hard at all  Food Insecurity: No Food Insecurity  . Worried About Charity fundraiser in the Last Year: Never true  . Ran Out of Food in the Last Year: Never true  Transportation Needs: No Transportation Needs  . Lack of Transportation (Medical): No  . Lack of Transportation (Non-Medical): No  Physical Activity: Sufficiently Active  . Days of Exercise per Week: 5 days  . Minutes of Exercise per Session: 30 min  Stress: No Stress Concern Present  . Feeling of Stress : Not at all  Social Connections: Unknown  . Frequency of Communication with Friends and Family: Not on file  . Frequency of Social Gatherings with Friends and Family: Not on file  . Attends Religious Services: Not on file  . Active Member of Clubs or Organizations: Not on file  . Attends Archivist Meetings: Not on file  . Marital Status: Married    Tobacco Counseling Counseling given: Not Answered   Clinical Intake:  Pre-visit preparation completed: Yes        Diabetes: No  How often do you need to have someone help you when you read instructions, pamphlets, or other written materials from your doctor or pharmacy?: 1 - Never   Interpreter Needed?: No      Activities of Daily Living In your present state of health, do you have any difficulty performing the following activities: 07/04/2020  Hearing? N  Vision? N  Difficulty concentrating or making decisions? N  Walking or climbing stairs? N  Dressing or bathing? N  Doing errands, shopping? N  Preparing Food and eating ? N  Using the Toilet? N  In the past six months, have you accidently leaked urine? N  Do you have problems with loss of bowel control? N  Managing your Medications? N  Managing your Finances? N  Housekeeping or managing your Housekeeping? N  Some recent data might be hidden    Patient Care Team: Einar Pheasant,  MD as PCP - General (Internal Medicine)  Indicate any recent Medical Services you may have received from other than [facility] providers in the past year (date may be approximate).     Assessment:   This is a routine wellness examination for April Santos.  I connected with April Santos today by telephone and verified that I am speaking with the correct person using two identifiers. Location patient: home Location provider: work Persons participating in the virtual visit: patient, Marine scientist.    I discussed the limitations, risks, security and privacy concerns of performing an evaluation and management service by telephone and the availability  of in person appointments. The patient expressed understanding and verbally consented to this telephonic visit.    Interactive audio and video telecommunications were attempted between this provider and patient, however failed, due to patient having technical difficulties OR patient did not have access to video capability.  We continued and completed visit with audio only.  Some vital signs may be absent or patient reported.   Hearing/Vision screen  Hearing Screening   125Hz  250Hz  500Hz  1000Hz  2000Hz  3000Hz  4000Hz  6000Hz  8000Hz   Right ear:           Left ear:           Comments: Patient is able to hear conversational tones without difficulty.  No issues reported.  Vision Screening Comments: Wears corrective lenses Visual acuity not assessed, virtual visit.      Dietary issues and exercise activities discussed: Current Exercise Habits: Home exercise routine, Type of exercise: walking, Intensity: Mild  Goals      Patient Stated   .  Increase physical activity (pt-stated)      As tolerated      Depression Screen PHQ 2/9 Scores 07/04/2020 01/28/2020 12/18/2018 12/19/2016 06/08/2013  PHQ - 2 Score 0 0 0 0 0    Fall Risk Fall Risk  07/04/2020 01/28/2020 06/15/2019 12/19/2016 02/05/2016  Falls in the past year? 0 0 0 No Yes  Number falls in past yr: 0 0 - - 2 or more   Injury with Fall? 0 0 - - No  Follow up Falls evaluation completed Falls evaluation completed Falls evaluation completed - -    FALL RISK PREVENTION PERTAINING TO THE HOME: Handrails in use when climbing stairs? Yes Home free of loose throw rugs in walkways, pet beds, electrical cords, etc? Yes  Adequate lighting in your home to reduce risk of falls? Yes   ASSISTIVE DEVICES UTILIZED TO PREVENT FALLS: Use of a cane, walker or w/c? No   TIMED UP AND GO: Was the test performed? No . Virtual visit.   Cognitive Function:  Patient is alert and oriented x3.  Denies difficulty focusing, making decisions, memory loss.  Enjoys brain health activities.  MMSE/6CIT deferred. Normal by direct observation/commuication.      Immunizations Immunization History  Administered Date(s) Administered  . Fluad Quad(high Dose 65+) 01/07/2020  . Influenza Split 04/28/2012  . Influenza,inj,Quad PF,6+ Mos 02/02/2013, 01/12/2014, 01/04/2015, 02/05/2016, 01/06/2017, 01/19/2018, 12/18/2018  . Moderna Sars-Covid-2 Vaccination 06/02/2019, 06/30/2019, 02/18/2020  . Zoster Recombinat (Shingrix) 12/28/2016, 05/31/2017    TDAP status: Due, Education has been provided regarding the importance of this vaccine. Advised may receive this vaccine at local pharmacy or Health Dept. Aware to provide a copy of the vaccination record if obtained from local pharmacy or Health Dept. Verbalized acceptance and understanding.  Deferred.   PNA vaccine- Advised may receive this vaccine at local pharmacy or Health Dept. Aware to provide a copy of the vaccination record if obtained from local pharmacy or Health Dept. Verbalized acceptance and understanding.  Deferred.   Health Maintenance Health Maintenance  Topic Date Due  . PNA vac Low Risk Adult (1 of 2 - PCV13) Never done  . Hepatitis C Screening  03/22/2021 (Originally [birth year])  . TETANUS/TDAP  07/04/2021 (Originally 06/05/1974)  . COVID-19 Vaccine (4 - Booster for Moderna  series) 08/17/2020  . MAMMOGRAM  12/09/2020  . COLONOSCOPY (Pts 45-7yrs Insurance coverage will need to be confirmed)  06/11/2026  . INFLUENZA VACCINE  Completed  . DEXA SCAN  Completed  . HPV VACCINES  Aged Out   Colorectal cancer screening: Type of screening: Colonoscopy. Completed 2018. Repeat every 10 years  Mammogram status: Completed 12/2019. Repeat every year. Ordered 01/2020.    Bone Density- ordered 03/20/20. Patient aware to schedule with mammogram if not complete at that time.   Lung Cancer Screening: (Low Dose CT Chest recommended if Age 64-80 years, 30 pack-year currently smoking OR have quit w/in 15years.) does not qualify.   Hepatitis C Screening: previously postponed/deferred.   Vision Screening: Recommended annual ophthalmology exams for early detection of glaucoma and other disorders of the eye. Is the patient up to date with their annual eye exam?  Yes  Who is the provider or what is the name of the office in which the patient attends annual eye exams? Dr. Matilde Sprang  Dental Screening: Recommended annual dental exams for proper oral hygiene. Visits every 6 months.   Community Resource Referral / Chronic Care Management: CRR required this visit?  No   CCM required this visit?  No      Plan:   Keep all routine maintenance appointments.   Cpe 07/28/20 @ 9:00  Reports protonix does not seem to be working as well. Taking pepcid after the meal if no relief. Plans to discuss at cpe.   Notes hysterectomy completed years ago. She has been taking Estradiol 1mg  daily since then. No longer taking this medication via physician recommendation. Now using hormonal patch (changing every 4 days). Patient believes this has resulted in breast tenderness and hair changes. Patient wants to discuss these changes with pcp at upcoming cpe.   I have personally reviewed and noted the following in the patient's chart:   . Medical and social history . Use of alcohol, tobacco or illicit drugs   . Current medications and supplements . Functional ability and status . Nutritional status . Physical activity . Advanced directives . List of other physicians . Hospitalizations, surgeries, and ER visits in previous 12 months . Vitals . Screenings to include cognitive, depression, and falls . Referrals and appointments  In addition, I have reviewed and discussed with patient certain preventive protocols, quality metrics, and best practice recommendations. A written personalized care plan for preventive services as well as general preventive health recommendations were provided to patient via mychart.     Varney Biles, LPN   12/01/4156

## 2020-07-04 NOTE — Patient Instructions (Addendum)
April Santos , Thank you for taking time to come for your Medicare Wellness Visit. I appreciate your ongoing commitment to your health goals. Please review the following plan we discussed and let me know if I can assist you in the future.   These are the goals we discussed: Goals      Patient Stated   .  Increase physical activity (pt-stated)      As tolerated       This is a list of the screening recommended for you and due dates:  Health Maintenance  Topic Date Due  . Pneumonia vaccines (1 of 2 - PCV13) Never done  .  Hepatitis C: One time screening is recommended by Center for Disease Control  (CDC) for  adults born from [birth year] through 1966.   03/22/2021*  . Tetanus Vaccine  07/04/2021*  . COVID-19 Vaccine (4 - Booster for Moderna series) 08/17/2020  . Mammogram  12/09/2020  . Colon Cancer Screening  06/11/2026  . Flu Shot  Completed  . DEXA scan (bone density measurement)  Completed  . HPV Vaccine  Aged Out  *Topic was postponed. The date shown is not the original due date.   Immunizations Immunization History  Administered Date(s) Administered  . Fluad Quad(high Dose 65+) 01/07/2020  . Influenza Split 04/28/2012  . Influenza,inj,Quad PF,6+ Mos 02/02/2013, 01/12/2014, 01/04/2015, 02/05/2016, 01/06/2017, 01/19/2018, 12/18/2018  . Moderna Sars-Covid-2 Vaccination 06/02/2019, 06/30/2019, 02/18/2020  . Zoster Recombinat (Shingrix) 12/28/2016, 05/31/2017   Keep all routine maintenance appointments.   Cpe 07/28/20 @ 9:00  Advanced directives: End of life planning; Advance aging; Advanced directives discussed.  Copy of current HCPOA/Living Will requested.    Conditions/risks identified: none new  Follow up in one year for your annual wellness visit    Preventive Care 65 Years and Older, Female Preventive care refers to lifestyle choices and visits with your health care provider that can promote health and wellness. What does preventive care include?  A yearly physical  exam. This is also called an annual well check.  Dental exams once or twice a year.  Routine eye exams. Ask your health care provider how often you should have your eyes checked.  Personal lifestyle choices, including:  Daily care of your teeth and gums.  Regular physical activity.  Eating a healthy diet.  Avoiding tobacco and drug use.  Limiting alcohol use.  Practicing safe sex.  Taking low-dose aspirin every day.  Taking vitamin and mineral supplements as recommended by your health care provider. What happens during an annual well check? The services and screenings done by your health care provider during your annual well check will depend on your age, overall health, lifestyle risk factors, and family history of disease. Counseling  Your health care provider may ask you questions about your:  Alcohol use.  Tobacco use.  Drug use.  Emotional well-being.  Home and relationship well-being.  Sexual activity.  Eating habits.  History of falls.  Memory and ability to understand (cognition).  Work and work Statistician.  Reproductive health. Screening  You may have the following tests or measurements:  Height, weight, and BMI.  Blood pressure.  Lipid and cholesterol levels. These may be checked every 5 years, or more frequently if you are over 85 years old.  Skin check.  Lung cancer screening. You may have this screening every year starting at age 45 if you have a 30-pack-year history of smoking and currently smoke or have quit within the past 15 years.  Fecal occult  blood test (FOBT) of the stool. You may have this test every year starting at age 44.  Flexible sigmoidoscopy or colonoscopy. You may have a sigmoidoscopy every 5 years or a colonoscopy every 10 years starting at age 7.  Hepatitis C blood test.  Hepatitis B blood test.  Sexually transmitted disease (STD) testing.  Diabetes screening. This is done by checking your blood sugar (glucose)  after you have not eaten for a while (fasting). You may have this done every 1-3 years.  Bone density scan. This is done to screen for osteoporosis. You may have this done starting at age 60.  Mammogram. This may be done every 1-2 years. Talk to your health care provider about how often you should have regular mammograms. Talk with your health care provider about your test results, treatment options, and if necessary, the need for more tests. Vaccines  Your health care provider may recommend certain vaccines, such as:  Influenza vaccine. This is recommended every year.  Tetanus, diphtheria, and acellular pertussis (Tdap, Td) vaccine. You may need a Td booster every 10 years.  Zoster vaccine. You may need this after age 67.  Pneumococcal 13-valent conjugate (PCV13) vaccine. One dose is recommended after age 41.  Pneumococcal polysaccharide (PPSV23) vaccine. One dose is recommended after age 52. Talk to your health care provider about which screenings and vaccines you need and how often you need them. This information is not intended to replace advice given to you by your health care provider. Make sure you discuss any questions you have with your health care provider. Document Released: 04/21/2015 Document Revised: 12/13/2015 Document Reviewed: 01/24/2015 Elsevier Interactive Patient Education  2017 Trail Prevention in the Home Falls can cause injuries. They can happen to people of all ages. There are many things you can do to make your home safe and to help prevent falls. What can I do on the outside of my home?  Regularly fix the edges of walkways and driveways and fix any cracks.  Remove anything that might make you trip as you walk through a door, such as a raised step or threshold.  Trim any bushes or trees on the path to your home.  Use bright outdoor lighting.  Clear any walking paths of anything that might make someone trip, such as rocks or  tools.  Regularly check to see if handrails are loose or broken. Make sure that both sides of any steps have handrails.  Any raised decks and porches should have guardrails on the edges.  Have any leaves, snow, or ice cleared regularly.  Use sand or salt on walking paths during winter.  Clean up any spills in your garage right away. This includes oil or grease spills. What can I do in the bathroom?  Use night lights.  Install grab bars by the toilet and in the tub and shower. Do not use towel bars as grab bars.  Use non-skid mats or decals in the tub or shower.  If you need to sit down in the shower, use a plastic, non-slip stool.  Keep the floor dry. Clean up any water that spills on the floor as soon as it happens.  Remove soap buildup in the tub or shower regularly.  Attach bath mats securely with double-sided non-slip rug tape.  Do not have throw rugs and other things on the floor that can make you trip. What can I do in the bedroom?  Use night lights.  Make sure that you have  a light by your bed that is easy to reach.  Do not use any sheets or blankets that are too big for your bed. They should not hang down onto the floor.  Have a firm chair that has side arms. You can use this for support while you get dressed.  Do not have throw rugs and other things on the floor that can make you trip. What can I do in the kitchen?  Clean up any spills right away.  Avoid walking on wet floors.  Keep items that you use a lot in easy-to-reach places.  If you need to reach something above you, use a strong step stool that has a grab bar.  Keep electrical cords out of the way.  Do not use floor polish or wax that makes floors slippery. If you must use wax, use non-skid floor wax.  Do not have throw rugs and other things on the floor that can make you trip. What can I do with my stairs?  Do not leave any items on the stairs.  Make sure that there are handrails on both  sides of the stairs and use them. Fix handrails that are broken or loose. Make sure that handrails are as long as the stairways.  Check any carpeting to make sure that it is firmly attached to the stairs. Fix any carpet that is loose or worn.  Avoid having throw rugs at the top or bottom of the stairs. If you do have throw rugs, attach them to the floor with carpet tape.  Make sure that you have a light switch at the top of the stairs and the bottom of the stairs. If you do not have them, ask someone to add them for you. What else can I do to help prevent falls?  Wear shoes that:  Do not have high heels.  Have rubber bottoms.  Are comfortable and fit you well.  Are closed at the toe. Do not wear sandals.  If you use a stepladder:  Make sure that it is fully opened. Do not climb a closed stepladder.  Make sure that both sides of the stepladder are locked into place.  Ask someone to hold it for you, if possible.  Clearly mark and make sure that you can see:  Any grab bars or handrails.  First and last steps.  Where the edge of each step is.  Use tools that help you move around (mobility aids) if they are needed. These include:  Canes.  Walkers.  Scooters.  Crutches.  Turn on the lights when you go into a dark area. Replace any light bulbs as soon as they burn out.  Set up your furniture so you have a clear path. Avoid moving your furniture around.  If any of your floors are uneven, fix them.  If there are any pets around you, be aware of where they are.  Review your medicines with your doctor. Some medicines can make you feel dizzy. This can increase your chance of falling. Ask your doctor what other things that you can do to help prevent falls. This information is not intended to replace advice given to you by your health care provider. Make sure you discuss any questions you have with your health care provider. Document Released: 01/19/2009 Document Revised:  08/31/2015 Document Reviewed: 04/29/2014 Elsevier Interactive Patient Education  2017 [employer].

## 2020-07-14 DIAGNOSIS — M4316 Spondylolisthesis, lumbar region: Secondary | ICD-10-CM | POA: Diagnosis not present

## 2020-07-14 DIAGNOSIS — M47816 Spondylosis without myelopathy or radiculopathy, lumbar region: Secondary | ICD-10-CM | POA: Diagnosis not present

## 2020-07-14 DIAGNOSIS — M79604 Pain in right leg: Secondary | ICD-10-CM | POA: Diagnosis not present

## 2020-07-14 DIAGNOSIS — M5416 Radiculopathy, lumbar region: Secondary | ICD-10-CM | POA: Diagnosis not present

## 2020-07-14 DIAGNOSIS — M5136 Other intervertebral disc degeneration, lumbar region: Secondary | ICD-10-CM | POA: Diagnosis not present

## 2020-07-14 DIAGNOSIS — M1811 Unilateral primary osteoarthritis of first carpometacarpal joint, right hand: Secondary | ICD-10-CM | POA: Diagnosis not present

## 2020-07-25 ENCOUNTER — Ambulatory Visit: Payer: Medicare PPO | Admitting: Dermatology

## 2020-07-28 ENCOUNTER — Encounter: Payer: Medicare PPO | Admitting: Internal Medicine

## 2020-08-14 ENCOUNTER — Other Ambulatory Visit: Payer: Self-pay

## 2020-08-14 ENCOUNTER — Ambulatory Visit: Payer: Medicare PPO | Admitting: Obstetrics and Gynecology

## 2020-08-14 ENCOUNTER — Encounter: Payer: Self-pay | Admitting: Obstetrics and Gynecology

## 2020-08-14 DIAGNOSIS — Z79890 Hormone replacement therapy (postmenopausal): Secondary | ICD-10-CM | POA: Diagnosis not present

## 2020-08-14 DIAGNOSIS — N951 Menopausal and female climacteric states: Secondary | ICD-10-CM | POA: Diagnosis not present

## 2020-08-14 MED ORDER — ESTRADIOL 0.05 MG/24HR TD PTTW
1.0000 | MEDICATED_PATCH | TRANSDERMAL | 7 refills | Status: DC
Start: 2020-08-14 — End: 2021-04-19

## 2020-08-14 NOTE — Progress Notes (Signed)
Patient ID: April Santos, female   DOB: [birth year], 64 y.o.   MRN: [id number]  Reason for Consult: Gynecologic Exam   Referred by Einar Pheasant, MD  Subjective:     HPI:  April Santos is a 65 y.o. female. She is following up today regarding hormone replacement therapy. She initially was having significant hot flashes, but she reports her symptoms have improved over the last month. She has noted some increased hair loss since changing her HRT dose. She reports her symptoms of vaginal and vulvar discomfort have resolved. She would like to continue her current dose of HRT at this time.   Gynecological History  Patient's last menstrual period was 02/26/2002.   Past Medical History:  Diagnosis Date  . Arthritis   . Colon polyps   . Disc disorder   . Dysphagia   . GERD (gastroesophageal reflux disease)   . History of uterine fibroid   . Psoriasis    Family History  Problem Relation Age of Onset  . Breast cancer Mother        double masectomy  . Arthritis Father   . GER disease Father   . Heart disease Other        paternal uncles  . Colon cancer Neg Hx    Past Surgical History:  Procedure Laterality Date  . ABDOMINAL HYSTERECTOMY    . BREAST SURGERY  1999   biopsy  . carpal  2005   Tunnel right hand  . COLONOSCOPY WITH ESOPHAGOGASTRODUODENOSCOPY (EGD)    . COLONOSCOPY WITH PROPOFOL N/A 06/10/2016   Procedure: COLONOSCOPY WITH PROPOFOL;  Surgeon: Manya Silvas, MD;  Location: [facility] ENDOSCOPY;  Service: Endoscopy;  Laterality: N/A;  . ESOPHAGOGASTRODUODENOSCOPY (EGD) WITH PROPOFOL N/A 06/10/2016   Procedure: ESOPHAGOGASTRODUODENOSCOPY (EGD) WITH PROPOFOL;  Surgeon: Manya Silvas, MD;  Location: [facility] ENDOSCOPY;  Service: Endoscopy;  Laterality: N/A;  . FOOT SURGERY  02/07/1003   right foot benign tumor  . SACROILIAC JOINT INJECTION  08/17/13 & 04/13/14   Dr. Sharlet Salina  . SIGMOIDOSCOPY    . UTERINE FIBROID SURGERY  1997    Short Social History:  Social History    Tobacco Use  . Smoking status: Never Smoker  . Smokeless tobacco: Never Used  Substance Use Topics  . Alcohol use: Yes    Alcohol/week: 1.0 standard drink    Types: 1 Glasses of wine per week    Comment: bimonthly    Allergies  Allergen Reactions  . Erythromycin Ethylsuccinate [Erythromycin]   . Omnicef [Cefdinir]     Current Outpatient Medications  Medication Sig Dispense Refill  . ALPRAZolam (XANAX) 0.25 MG tablet Take 1 tablet (0.25 mg total) by mouth at bedtime as needed. 30 tablet 0  . Apremilast 30 MG TABS Take 1 tablet by mouth daily.    [name] (EUCRISA) 2 % OINT Apply once to twice daily as needed 60 g 3  . desonide (DESOWEN) 0.05 % cream Apply topically 2 (two) times daily. 30 g 0  . estradiol (ESTRACE) 0.1 MG/GM vaginal cream Place 1 Applicatorful vaginally once a week. 42.5 g 2  . famotidine (PEPCID) 40 MG tablet Take one tablet 30 minutes before evening meal 30 tablet 2  . HYDROcodone-acetaminophen (NORCO) 5-325 MG per tablet 1/2 - 1 tablet q 8 hours prn 10 tablet 0  . ibuprofen (ADVIL,MOTRIN) 200 MG tablet Take 200 mg by mouth every 6 (six) hours as needed.    . meloxicam (MOBIC) 15 MG tablet Take by mouth.    Marland Kitchen  Multiple Vitamins-Minerals (MULTIVITAL PO) Take by mouth daily.    . Calcium Citrate (CITRACAL PO) Take by mouth 4 (four) times daily.    Marland Kitchen estradiol (VIVELLE-DOT) 0.05 MG/24HR patch Place 1 patch (0.05 mg total) onto the skin 2 (two) times a week. 12 patch 7  . pantoprazole (PROTONIX) 40 MG tablet Take 1 tablet (40 mg total) by mouth 2 (two) times daily. (Patient not taking: Reported on 08/14/2020) 180 tablet 1   No current facility-administered medications for this visit.    Review of Systems  Constitutional: Negative for chills, fatigue, fever and unexpected weight change.  HENT: Negative for trouble swallowing.  Eyes: Negative for loss of vision.  Respiratory: Negative for cough, shortness of breath and wheezing.  Cardiovascular: Negative for  chest pain, leg swelling, palpitations and syncope.  GI: Negative for abdominal pain, blood in stool, diarrhea, nausea and vomiting.  GU: Negative for difficulty urinating, dysuria, frequency and hematuria.  Musculoskeletal: Negative for back pain, leg pain and joint pain.  Skin: Negative for rash.  Neurological: Negative for dizziness, headaches, light-headedness, numbness and seizures.  Psychiatric: Negative for behavioral problem, confusion, depressed mood and sleep disturbance.        Objective:  Objective   Vitals:   08/14/20 1616  BP: 120/70  Weight: 142 lb 12.8 oz (64.8 kg)  Height: 5\' 1"  (1.549 m)   Body mass index is 26.98 kg/m.  Physical Exam Vitals and nursing note reviewed. Exam conducted with a chaperone present.  Constitutional:      Appearance: Normal appearance.  HENT:     Head: Normocephalic and atraumatic.  Eyes:     Extraocular Movements: Extraocular movements intact.     Pupils: Pupils are equal, round, and reactive to light.  Cardiovascular:     Rate and Rhythm: Normal rate and regular rhythm.  Pulmonary:     Effort: Pulmonary effort is normal.     Breath sounds: Normal breath sounds.  Abdominal:     General: Abdomen is flat.     Palpations: Abdomen is soft.  Musculoskeletal:     Cervical back: Normal range of motion.  Skin:    General: Skin is warm and dry.  Neurological:     General: No focal deficit present.     Mental Status: She is alert and oriented to person, place, and time.  Psychiatric:        Behavior: Behavior normal.        Thought Content: Thought content normal.        Judgment: Judgment normal.     Assessment/Plan:     65 yo following up regarding HRT Continue vivelle dot dosage at this time. Will plan on reassessing in December for her annual.  We discussed that she did not have a cervix seen at the time of her colposcopy. Unless she has a history of CIN 2 in the last 20 years her pap smears can be discontinued, but if  desired the vaginal cytology can be repeated at the time of her annual  More than 20 minutes were spent face to face with the patient in the room, reviewing the medical record, labs and images, and coordinating care for the patient. The plan of management was discussed in detail and counseling was provided.    Adrian Prows MD, [facility], [facility] 08/14/2020 5:07 PM

## 2020-08-21 ENCOUNTER — Telehealth: Payer: Self-pay | Admitting: Internal Medicine

## 2020-08-21 ENCOUNTER — Encounter: Payer: Self-pay | Admitting: Internal Medicine

## 2020-08-21 ENCOUNTER — Ambulatory Visit: Payer: Medicare PPO | Admitting: Internal Medicine

## 2020-08-21 ENCOUNTER — Other Ambulatory Visit: Payer: Self-pay

## 2020-08-21 VITALS — BP 132/78 | HR 86 | Temp 97.0°F | Resp 16 | Ht 61.0 in | Wt 142.0 lb

## 2020-08-21 DIAGNOSIS — F439 Reaction to severe stress, unspecified: Secondary | ICD-10-CM

## 2020-08-21 DIAGNOSIS — Z23 Encounter for immunization: Secondary | ICD-10-CM | POA: Diagnosis not present

## 2020-08-21 DIAGNOSIS — Z79899 Other long term (current) drug therapy: Secondary | ICD-10-CM

## 2020-08-21 DIAGNOSIS — L4050 Arthropathic psoriasis, unspecified: Secondary | ICD-10-CM

## 2020-08-21 DIAGNOSIS — Z13220 Encounter for screening for lipoid disorders: Secondary | ICD-10-CM | POA: Diagnosis not present

## 2020-08-21 DIAGNOSIS — R739 Hyperglycemia, unspecified: Secondary | ICD-10-CM | POA: Diagnosis not present

## 2020-08-21 DIAGNOSIS — K219 Gastro-esophageal reflux disease without esophagitis: Secondary | ICD-10-CM

## 2020-08-21 DIAGNOSIS — Z0000 Encounter for general adult medical examination without abnormal findings: Secondary | ICD-10-CM

## 2020-08-21 MED ORDER — RABEPRAZOLE SODIUM 20 MG PO TBEC: 20.0000 mg | DELAYED_RELEASE_TABLET | Freq: Every day | ORAL | 2 refills | Status: DC

## 2020-08-21 NOTE — Progress Notes (Signed)
Patient ID: April Santos, female   DOB: [birth year], 65 y.o.   MRN: [id number]   Subjective:    Patient ID: April Santos, female    DOB: 09-Mar-1956, 65 y.o.   MRN: [id number]  HPI This visit occurred during the SARS-CoV-2 public health emergency.  Safety protocols were in place, including screening questions prior to the visit, additional usage of staff PPE, and extensive cleaning of exam room while observing appropriate contact time as indicated for disinfecting solutions.  Patient here for a scheduled physical. Seeing gyn.  Up to date with her pelvic/pap smears.  She is concerned about having to come off estrogen.  Was changed to vivelle dot.  With the change, noticed increased "menopausal" symptoms.  These are leveling off some with the patch  - "getting into her system".  Was concerned regarding hair loss.  Tries to stay active.  No chest pain.  Breathing stable.  No increased cough or congestion.  Off protonix.  Aw GI.  Trouble with - density of food and bite size.  Discussed EGD.  No abdominal pain.  Bowels moving.  Appears to be handling stress relatively well.   Past Medical History:  Diagnosis Date  . Arthritis   . Colon polyps   . Disc disorder   . Dysphagia   . GERD (gastroesophageal reflux disease)   . History of uterine fibroid   . Psoriasis    Past Surgical History:  Procedure Laterality Date  . ABDOMINAL HYSTERECTOMY    . BREAST SURGERY  1999   biopsy  . carpal  2005   Tunnel right hand  . COLONOSCOPY WITH ESOPHAGOGASTRODUODENOSCOPY (EGD)    . COLONOSCOPY WITH PROPOFOL N/A 06/10/2016   Procedure: COLONOSCOPY WITH PROPOFOL;  Surgeon: Manya Silvas, MD;  Location: [facility] ENDOSCOPY;  Service: Endoscopy;  Laterality: N/A;  . ESOPHAGOGASTRODUODENOSCOPY (EGD) WITH PROPOFOL N/A 06/10/2016   Procedure: ESOPHAGOGASTRODUODENOSCOPY (EGD) WITH PROPOFOL;  Surgeon: Manya Silvas, MD;  Location: [facility] ENDOSCOPY;  Service: Endoscopy;  Laterality: N/A;  . FOOT SURGERY  02/07/1003    right foot benign tumor  . SACROILIAC JOINT INJECTION  08/17/13 & 04/13/14   Dr. Sharlet Salina  . SIGMOIDOSCOPY    . UTERINE FIBROID SURGERY  1997   Family History  Problem Relation Age of Onset  . Breast cancer Mother        double masectomy  . Arthritis Father   . GER disease Father   . Heart disease Other        paternal uncles  . Colon cancer Neg Hx    Social History   Socioeconomic History  . Marital status: Divorced    Spouse name: Not on file  . Number of children: 0  . Years of education: Not on file  . Highest education level: Not on file  Occupational History  . Not on file  Tobacco Use  . Smoking status: Never Smoker  . Smokeless tobacco: Never Used  Substance and Sexual Activity  . Alcohol use: Yes    Alcohol/week: 1.0 standard drink    Types: 1 Glasses of wine per week    Comment: bimonthly  . Drug use: No  . Sexual activity: Yes  Other Topics Concern  . Not on file  Social History Narrative  . Not on file   Social Determinants of Health   Financial Resource Strain: Low Risk   . Difficulty of Paying Living Expenses: Not hard at all  Food Insecurity: No Food Insecurity  . Worried About Running  Out of Food in the Last Year: Never true  . Ran Out of Food in the Last Year: Never true  Transportation Needs: No Transportation Needs  . Lack of Transportation (Medical): No  . Lack of Transportation (Non-Medical): No  Physical Activity: Sufficiently Active  . Days of Exercise per Week: 5 days  . Minutes of Exercise per Session: 30 min  Stress: No Stress Concern Present  . Feeling of Stress : Not at all  Social Connections: Unknown  . Frequency of Communication with Friends and Family: Not on file  . Frequency of Social Gatherings with Friends and Family: Not on file  . Attends Religious Services: Not on file  . Active Member of Clubs or Organizations: Not on file  . Attends Archivist Meetings: Not on file  . Marital Status: Married    Outpatient  Encounter Medications as of 08/21/2020  Medication Sig  . RABEprazole (ACIPHEX) 20 MG tablet Take 1 tablet (20 mg total) by mouth daily.  Marland Kitchen ALPRAZolam (XANAX) 0.25 MG tablet Take 1 tablet (0.25 mg total) by mouth at bedtime as needed.  Marland Kitchen Apremilast 30 MG TABS Take 1 tablet by mouth daily.  . Calcium Citrate (CITRACAL PO) Take by mouth 4 (four) times daily.  [name] (EUCRISA) 2 % OINT Apply once to twice daily as needed  . desonide (DESOWEN) 0.05 % cream Apply topically 2 (two) times daily.  Marland Kitchen estradiol (ESTRACE) 0.1 MG/GM vaginal cream Place 1 Applicatorful vaginally once a week.  . estradiol (VIVELLE-DOT) 0.05 MG/24HR patch Place 1 patch (0.05 mg total) onto the skin 2 (two) times a week.  . famotidine (PEPCID) 40 MG tablet Take one tablet 30 minutes before evening meal  . HYDROcodone-acetaminophen (NORCO) 5-325 MG per tablet 1/2 - 1 tablet q 8 hours prn  . ibuprofen (ADVIL,MOTRIN) 200 MG tablet Take 200 mg by mouth every 6 (six) hours as needed.  . Multiple Vitamins-Minerals (MULTIVITAL PO) Take by mouth daily.  . [DISCONTINUED] meloxicam (MOBIC) 15 MG tablet Take by mouth. (Patient not taking: Reported on 08/21/2020)  . [DISCONTINUED] pantoprazole (PROTONIX) 40 MG tablet Take 1 tablet (40 mg total) by mouth 2 (two) times daily. (Patient not taking: No sig reported)   No facility-administered encounter medications on file as of 08/21/2020.    Review of Systems  Constitutional: Negative for appetite change and unexpected weight change.  HENT: Negative for congestion, sinus pressure and sore throat.   Eyes: Negative for pain and visual disturbance.  Respiratory: Negative for cough, chest tightness and shortness of breath.   Cardiovascular: Negative for chest pain, palpitations and leg swelling.  Gastrointestinal: Negative for abdominal pain, diarrhea, nausea and vomiting.  Genitourinary: Negative for difficulty urinating and dysuria.  Musculoskeletal: Negative for joint swelling and  myalgias.  Skin: Negative for color change and rash.  Neurological: Negative for dizziness, light-headedness and headaches.  Hematological: Negative for adenopathy. Does not bruise/bleed easily.  Psychiatric/Behavioral: Negative for agitation and dysphoric mood.       Objective:    Physical Exam Vitals reviewed.  Constitutional:      General: She is not in acute distress.    Appearance: Normal appearance.  HENT:     Head: Normocephalic and atraumatic.     Right Ear: External ear normal.     Left Ear: External ear normal.  Eyes:     General: No scleral icterus.       Right eye: No discharge.        Left eye: No  discharge.     Conjunctiva/sclera: Conjunctivae normal.  Neck:     Thyroid: No thyromegaly.  Cardiovascular:     Rate and Rhythm: Normal rate and regular rhythm.  Pulmonary:     Effort: No respiratory distress.     Breath sounds: Normal breath sounds. No wheezing.  Abdominal:     General: Bowel sounds are normal.     Palpations: Abdomen is soft.     Tenderness: There is no abdominal tenderness.  Genitourinary:    Comments: Performed by gyn.  Musculoskeletal:        General: No swelling or tenderness.     Cervical back: Neck supple. No tenderness.  Lymphadenopathy:     Cervical: No cervical adenopathy.  Skin:    Findings: No erythema or rash.  Neurological:     Mental Status: She is alert.  Psychiatric:        Mood and Affect: Mood normal.        Behavior: Behavior normal.     BP 132/78   Pulse 86   Temp (!) 97 F (36.1 C)   Resp 16   Ht _0  (1.549 m)   Wt 142 lb (64.4 kg)   LMP 02/26/2002   SpO2 99%   BMI 26.83 kg/m  Wt Readings from Last 3 Encounters:  08/21/20 142 lb (64.4 kg)  08/14/20 142 lb 12.8 oz (64.8 kg)  07/04/20 142 lb (64.4 kg)     Lab Results  Component Value Date   WBC 7.4 01/28/2020   HGB 13.0 01/28/2020   HCT 38.9 01/28/2020   PLT 321.0 01/28/2020   GLUCOSE 88 03/15/2020   CHOL 195 01/28/2020   TRIG 54.0 01/28/2020    HDL 80.60 01/28/2020   LDLCALC 104 (H) 01/28/2020   ALT 16 01/28/2020   AST 18 01/28/2020   NA 139 01/28/2020   K 4.3 01/28/2020   CL 105 01/28/2020   CREATININE 0.73 01/28/2020   BUN 22 01/28/2020   CO2 26 01/28/2020   TSH 0.39 01/28/2020   HGBA1C 5.7 03/15/2020       Assessment & Plan:   Problem List Items Addressed This Visit    Current use of estrogen therapy    On vivelle dot now.  Increased symptoms with transition of oral estrogen to the patch.  Leveling off some now.  Continue f/u with gyn.       GERD (gastroesophageal reflux disease)    Previously saw GI.  Was on protonix.  Off now.  Monitoring food intake and bite size.  Questions regarding EGD.  Previously saw Dr Marius Ditch.       Relevant Medications   RABEprazole (ACIPHEX) 20 MG tablet   Health care maintenance    Physical today 08/21/20.  Mammogram 12/10/19 - Birads II.  Colonoscopy 06/2016 - diverticulosis and internal hemorrhoids.  Recommended f/u colonoscopy in 10 years.  PAP through gyn.       Hyperglycemia    Low carb diet and exercise.  Follow met b and a1c.        Relevant Orders   CBC with Differential/Platelet   Comprehensive metabolic panel   Hemoglobin A1c   Psoriatic arthritis ([place])    Followed by Dr Nehemiah Massed.        Stress    Appears to be handling things relatively well.  Follow.        Other Visit Diagnoses    Routine general medical examination at a health care facility    -  Primary  Need for pneumococcal vaccine       Relevant Orders   Pneumococcal conjugate vaccine 20-valent (Prevnar 20) (Completed)   Screening cholesterol level       Relevant Orders   Lipid panel       Einar Pheasant, MD

## 2020-08-21 NOTE — Telephone Encounter (Signed)
Patient ask about faxing labs

## 2020-08-22 ENCOUNTER — Ambulatory Visit (INDEPENDENT_AMBULATORY_CARE_PROVIDER_SITE_OTHER): Payer: Medicare PPO | Admitting: Dermatology

## 2020-08-22 DIAGNOSIS — L988 Other specified disorders of the skin and subcutaneous tissue: Secondary | ICD-10-CM

## 2020-08-22 DIAGNOSIS — R739 Hyperglycemia, unspecified: Secondary | ICD-10-CM | POA: Insufficient documentation

## 2020-08-22 NOTE — Progress Notes (Signed)
   Follow-Up Visit   Subjective  April Santos is a 65 y.o. female who presents for the following: Facial Elastosis (Face, pt presents for Botox today).  The following portions of the chart were reviewed this encounter and updated as appropriate:   Tobacco  Allergies  Meds  Problems  Med Hx  Surg Hx  Fam Hx     Review of Systems:  No other skin or systemic complaints except as noted in HPI or Assessment and Plan.  Objective  Well appearing patient in no apparent distress; mood and affect are within normal limits.  A focused examination was performed including face. Relevant physical exam findings are noted in the Assessment and Plan.  Objective  Head - Anterior (Face): Rhytides and volume loss.   Images     Assessment & Plan  Elastosis of skin Head - Anterior (Face)  Botox 52.5 units injected today to: - Frown complex 32.5 units - Brow lift 5 units x 2 - Crow's feet 5 units x 2  Botox Injection - Head - Anterior (Face) Location: Frown complex, brow lift bil, crow's feet bil  Informed consent: Discussed risks (infection, pain, bleeding, bruising, swelling, allergic reaction, paralysis of nearby muscles, eyelid droop, double vision, neck weakness, difficulty breathing, headache, undesirable cosmetic result, and need for additional treatment) and benefits of the procedure, as well as the alternatives.  Informed consent was obtained.  Preparation: The area was cleansed with alcohol.  Procedure Details:  Botox was injected into the dermis with a 30-gauge needle. Pressure applied to any bleeding. Ice packs offered for swelling.  Lot Number:  [id number] Expiration:  09/2019  Total Units Injected:  52.5  Plan: Patient was instructed to remain upright for 4 hours. Patient was instructed to avoid massaging the face and avoid vigorous exercise for the rest of the day. Tylenol may be used for headache.  Allow 2 weeks before returning to clinic for additional dosing as  needed. Patient will call for any problems.   Return for as scheduled for psoriasis f/u.  I, Othelia Pulling, RMA, am acting as scribe for Sarina Ser, MD .  Documentation: I have reviewed the above documentation for accuracy and completeness, and I agree with the above.  Sarina Ser, MD

## 2020-08-22 NOTE — Telephone Encounter (Signed)
Labs ordered.  Please schedule labs in 2-3 weeks.

## 2020-08-22 NOTE — Patient Instructions (Signed)

## 2020-08-22 NOTE — Telephone Encounter (Signed)
See my chart message

## 2020-08-24 NOTE — Telephone Encounter (Signed)
Labs are already scheduled.

## 2020-08-27 ENCOUNTER — Encounter: Payer: Self-pay | Admitting: Internal Medicine

## 2020-08-27 ENCOUNTER — Telehealth: Payer: Self-pay | Admitting: Internal Medicine

## 2020-08-27 NOTE — Telephone Encounter (Signed)
See me before calling pt.  Previously saw Dr Marius Ditch  Please call pt to clarify GI appt.

## 2020-08-27 NOTE — Assessment & Plan Note (Signed)
Low carb diet and exercise.  Follow met b and a1c.   

## 2020-08-27 NOTE — Assessment & Plan Note (Addendum)
Previously saw GI.  Was on protonix.  Off now.  Monitoring food intake and bite size.  Questions regarding EGD.  Previously saw Dr Marius Ditch.

## 2020-08-27 NOTE — Assessment & Plan Note (Signed)
Followed by Dr Kowalski.  

## 2020-08-27 NOTE — Assessment & Plan Note (Signed)
Physical today 08/21/20.  Mammogram 12/10/19 - Birads II.  Colonoscopy 06/2016 - diverticulosis and internal hemorrhoids.  Recommended f/u colonoscopy in 10 years.  PAP through gyn.

## 2020-08-27 NOTE — Assessment & Plan Note (Signed)
Appears to be handling things relatively well.  Follow.  

## 2020-08-27 NOTE — Assessment & Plan Note (Signed)
On vivelle dot now.  Increased symptoms with transition of oral estrogen to the patch.  Leveling off some now.  Continue f/u with gyn.

## 2020-08-29 NOTE — Telephone Encounter (Signed)
LMTCB

## 2020-08-30 NOTE — Telephone Encounter (Signed)
LMTCB

## 2020-09-01 ENCOUNTER — Encounter: Payer: Self-pay | Admitting: Dermatology

## 2020-09-06 ENCOUNTER — Other Ambulatory Visit: Payer: Self-pay

## 2020-09-06 ENCOUNTER — Other Ambulatory Visit (INDEPENDENT_AMBULATORY_CARE_PROVIDER_SITE_OTHER): Payer: Medicare PPO

## 2020-09-06 DIAGNOSIS — R739 Hyperglycemia, unspecified: Secondary | ICD-10-CM

## 2020-09-06 DIAGNOSIS — Z13220 Encounter for screening for lipoid disorders: Secondary | ICD-10-CM

## 2020-09-06 LAB — CBC WITH DIFFERENTIAL/PLATELET
Basophils Absolute: 0 10*3/uL (ref 0.0–0.1)
Basophils Relative: 0.3 % (ref 0.0–3.0)
Eosinophils Absolute: 0.1 10*3/uL (ref 0.0–0.7)
Eosinophils Relative: 1.3 % (ref 0.0–5.0)
HCT: 37.1 % (ref 36.0–46.0)
Hemoglobin: 12.6 g/dL (ref 12.0–15.0)
Lymphocytes Relative: 29 % (ref 12.0–46.0)
Lymphs Abs: 1.3 10*3/uL (ref 0.7–4.0)
MCHC: 33.9 g/dL (ref 30.0–36.0)
MCV: 84.9 fl (ref 78.0–100.0)
Monocytes Absolute: 0.4 10*3/uL (ref 0.1–1.0)
Monocytes Relative: 10.4 % (ref 3.0–12.0)
Neutro Abs: 2.6 10*3/uL (ref 1.4–7.7)
Neutrophils Relative %: 59 % (ref 43.0–77.0)
Platelets: 282 10*3/uL (ref 150.0–400.0)
RBC: 4.37 Mil/uL (ref 3.87–5.11)
RDW: 14.4 % (ref 11.5–15.5)
WBC: 4.3 10*3/uL (ref 4.0–10.5)

## 2020-09-06 LAB — LIPID PANEL
Cholesterol: 181 mg/dL (ref 0–200)
HDL: 60.8 mg/dL (ref >39.00)
LDL Cholesterol: 106 mg/dL — ABNORMAL HIGH (ref 0–99)
NonHDL: 120.64
Total CHOL/HDL Ratio: 3
Triglycerides: 71 mg/dL (ref 0.0–149.0)
VLDL: 14.2 mg/dL (ref 0.0–40.0)

## 2020-09-06 LAB — COMPREHENSIVE METABOLIC PANEL
ALT: 17 U/L (ref 0–35)
AST: 17 U/L (ref 0–37)
Albumin: 4 g/dL (ref 3.5–5.2)
Alkaline Phosphatase: 64 U/L (ref 39–117)
BUN: 16 mg/dL (ref 6–23)
CO2: 27 mEq/L (ref 19–32)
Calcium: 9.2 mg/dL (ref 8.4–10.5)
Chloride: 105 mEq/L (ref 96–112)
Creatinine, Ser: 0.79 mg/dL (ref 0.40–1.20)
GFR: 78.04 mL/min (ref >60.00)
Glucose, Bld: 86 mg/dL (ref 70–99)
Potassium: 4 mEq/L (ref 3.5–5.1)
Sodium: 139 mEq/L (ref 135–145)
Total Bilirubin: 0.6 mg/dL (ref 0.2–1.2)
Total Protein: 6.3 g/dL (ref 6.0–8.3)

## 2020-09-06 LAB — HEMOGLOBIN A1C: Hgb A1c MFr Bld: 5.7 % (ref 4.6–6.5)

## 2020-09-06 NOTE — Telephone Encounter (Signed)
LMTCB

## 2020-09-22 DIAGNOSIS — M1811 Unilateral primary osteoarthritis of first carpometacarpal joint, right hand: Secondary | ICD-10-CM | POA: Diagnosis not present

## 2020-10-16 ENCOUNTER — Other Ambulatory Visit: Payer: Self-pay | Admitting: Orthopedic Surgery

## 2020-10-16 DIAGNOSIS — M5136 Other intervertebral disc degeneration, lumbar region: Secondary | ICD-10-CM

## 2020-10-16 DIAGNOSIS — M5416 Radiculopathy, lumbar region: Secondary | ICD-10-CM

## 2020-10-16 DIAGNOSIS — M4316 Spondylolisthesis, lumbar region: Secondary | ICD-10-CM

## 2020-10-18 DIAGNOSIS — M1811 Unilateral primary osteoarthritis of first carpometacarpal joint, right hand: Secondary | ICD-10-CM | POA: Diagnosis not present

## 2020-10-25 ENCOUNTER — Other Ambulatory Visit: Payer: Self-pay | Admitting: Orthopedic Surgery

## 2020-10-27 ENCOUNTER — Ambulatory Visit
Admission: RE | Admit: 2020-10-27 | Discharge: 2020-10-27 | Disposition: A | Discharge status: Home / Self Care | Payer: Medicare PPO | Source: Ambulatory Visit | Attending: Orthopedic Surgery | Admitting: Orthopedic Surgery

## 2020-10-27 ENCOUNTER — Other Ambulatory Visit: Payer: Self-pay

## 2020-10-27 DIAGNOSIS — M5416 Radiculopathy, lumbar region: Secondary | ICD-10-CM | POA: Insufficient documentation

## 2020-10-27 DIAGNOSIS — M545 Low back pain, unspecified: Secondary | ICD-10-CM | POA: Diagnosis not present

## 2020-10-27 DIAGNOSIS — M4316 Spondylolisthesis, lumbar region: Secondary | ICD-10-CM | POA: Insufficient documentation

## 2020-10-27 DIAGNOSIS — M5136 Other intervertebral disc degeneration, lumbar region: Secondary | ICD-10-CM

## 2020-10-30 ENCOUNTER — Encounter: Payer: Self-pay | Admitting: Internal Medicine

## 2020-10-30 NOTE — Telephone Encounter (Signed)
Called patient to confirm doing ok. Wants to discuss antiviral treatment. Appt scheduled with Dr Caryl Bis tomorrow morning.

## 2020-10-31 ENCOUNTER — Telehealth (INDEPENDENT_AMBULATORY_CARE_PROVIDER_SITE_OTHER): Payer: Medicare PPO | Admitting: Family Medicine

## 2020-10-31 ENCOUNTER — Telehealth: Payer: Self-pay | Admitting: Internal Medicine

## 2020-10-31 ENCOUNTER — Other Ambulatory Visit: Payer: Self-pay

## 2020-10-31 VITALS — Ht 61.0 in | Wt 142.0 lb

## 2020-10-31 DIAGNOSIS — Z8616 Personal history of COVID-19: Secondary | ICD-10-CM | POA: Insufficient documentation

## 2020-10-31 DIAGNOSIS — U071 COVID-19: Secondary | ICD-10-CM | POA: Diagnosis not present

## 2020-10-31 MED ORDER — HYDROCOD POLST-CPM POLST ER 10-8 MG/5ML PO SUER: 5.0000 mL | Freq: Two times a day (BID) | ORAL | 0 refills | Status: DC | PRN

## 2020-10-31 MED ORDER — HYDROCODONE BIT-HOMATROP MBR 5-1.5 MG/5ML PO SOLN: 5.0000 mL | Freq: Three times a day (TID) | ORAL | 0 refills | Status: DC | PRN

## 2020-10-31 MED ORDER — NIRMATRELVIR/RITONAVIR (PAXLOVID)TABLET: 3.0000 | ORAL_TABLET | Freq: Two times a day (BID) | ORAL | 0 refills | Status: AC

## 2020-10-31 NOTE — Assessment & Plan Note (Signed)
Patient with positive home COVID testing COVID symptoms.  Discussed quarantine lasting through 11/07/2020.  Advised she can come off of quarantine if she had improved symptoms with no fever over the past 24 hours prior to the end of quarantine.  We will treat with paxlovid.  This was sent to her local pharmacy though she will let us know if they are not able to get this for her.  I did discuss the potential side effects of altered taste, diarrhea, hypertension, and myalgias.  I also discussed the potential for worsening symptoms after coming off of paxlovid.  We will treat her cough with Hycodan.  Controlled substance database reviewed.  She will seek medical attention for shortness of breath, cough productive of blood, or fevers greater than 103 F.  If she worsens she will let us know.  She noted her husband had allergy-like symptoms yesterday and had a negative home test yesterday and today.  I encouraged her to have him quarantine as well and retest on Thursday.

## 2020-10-31 NOTE — Progress Notes (Signed)
Virtual Visit via telephone Note  This visit type was conducted due to national recommendations for restrictions regarding the COVID-19 pandemic (e.g. social distancing).  This format is felt to be most appropriate for this patient at this time.  All issues noted in this document were discussed and addressed.  No physical exam was performed (except for noted visual exam findings with Video Visits).   I connected with April Santos today at  9:30 AM EDT by telephone and verified that I am speaking with the correct person using two identifiers. Location patient: home Location provider: work  Persons participating in the virtual visit: patient, provider  I discussed the limitations, risks, security and privacy concerns of performing an evaluation and management service by telephone and the availability of in person appointments. I also discussed with the patient that there may be a patient responsible charge related to this service. The patient expressed understanding and agreed to proceed.  Interactive audio and video telecommunications were attempted between this provider and patient, however failed, due to patient having technical difficulties OR patient did not have access to video capability.  We continued and completed visit with audio only.   Reason for visit: COVID19  HPI: Respiratory illness: onset of symptoms 10/28/20. Worsened over 2 days. Not much appetite.  Cough- yes, nonproductive  Congestion- no   Sinus- no   Chest- no  Post nasal drip- no  Sore throat- yes  Shortness of breath- no  Fever-yes  Taste disturbance- no  Smell disturbance- no  Covid exposure- no  Covid vaccination- yes  Covid booster- one  Medications- ibuprofen, hycodan that was expired    ROS: See pertinent positives and negatives per HPI.  Past Medical History:  Diagnosis Date   Arthritis    Colon polyps    Disc disorder    Dysphagia    GERD (gastroesophageal reflux disease)    History of  uterine fibroid    Psoriasis     Past Surgical History:  Procedure Laterality Date   ABDOMINAL HYSTERECTOMY     BREAST SURGERY  1999   biopsy   carpal  2005   Tunnel right hand   COLONOSCOPY WITH ESOPHAGOGASTRODUODENOSCOPY (EGD)     COLONOSCOPY WITH PROPOFOL N/A 06/10/2016   Procedure: COLONOSCOPY WITH PROPOFOL;  Surgeon: Manya Silvas, MD;  Location: [place];  Service: Endoscopy;  Laterality: N/A;   ESOPHAGOGASTRODUODENOSCOPY (EGD) WITH PROPOFOL N/A 06/10/2016   Procedure: ESOPHAGOGASTRODUODENOSCOPY (EGD) WITH PROPOFOL;  Surgeon: Manya Silvas, MD;  Location: [facility] ENDOSCOPY;  Service: Endoscopy;  Laterality: N/A;   FOOT SURGERY  02/07/1003   right foot benign tumor   SACROILIAC JOINT INJECTION  08/17/13 & 04/13/14   Dr. Sharlet Salina   SIGMOIDOSCOPY     UTERINE FIBROID SURGERY  1997    Family History  Problem Relation Age of Onset   Breast cancer Mother        double masectomy   Arthritis Father    GER disease Father    Heart disease Other        paternal uncles   Colon cancer Neg Hx     SOCIAL HX: non-smoker   Current Outpatient Medications:    ALPRAZolam (XANAX) 0.25 MG tablet, Take 1 tablet (0.25 mg total) by mouth at bedtime as needed., Disp: 30 tablet, Rfl: 0   Apremilast 30 MG TABS, Take 1 tablet by mouth daily., Disp: , Rfl:    Calcium Citrate (CITRACAL PO), Take by mouth 4 (four) times daily., Disp: , Rfl:  Crisaborole (EUCRISA) 2 % OINT, Apply once to twice daily as needed, Disp: 60 g, Rfl: 3   desonide (DESOWEN) 0.05 % cream, Apply topically 2 (two) times daily., Disp: 30 g, Rfl: 0   estradiol (ESTRACE) 0.1 MG/GM vaginal cream, Place 1 Applicatorful vaginally once a week., Disp: 42.5 g, Rfl: 2   estradiol (VIVELLE-DOT) 0.05 MG/24HR patch, Place 1 patch (0.05 mg total) onto the skin 2 (two) times a week., Disp: 12 patch, Rfl: 7   famotidine (PEPCID) 40 MG tablet, Take one tablet 30 minutes before evening meal, Disp: 30 tablet, Rfl: 2   HYDROcodone  bit-homatropine (HYCODAN) 5-1.5 MG/5ML syrup, Take 5 mLs by mouth every 8 (eight) hours as needed for cough., Disp: 120 mL, Rfl: 0   HYDROcodone-acetaminophen (NORCO) 5-325 MG per tablet, 1/2 - 1 tablet q 8 hours prn, Disp: 10 tablet, Rfl: 0   ibuprofen (ADVIL,MOTRIN) 200 MG tablet, Take 200 mg by mouth every 6 (six) hours as needed., Disp: , Rfl:    Multiple Vitamins-Minerals (MULTIVITAL PO), Take by mouth daily., Disp: , Rfl:    nirmatrelvir/ritonavir EUA (PAXLOVID) TABS, Take 3 tablets by mouth 2 (two) times daily for 5 days. (Take nirmatrelvir 150 mg two tablets twice daily for 5 days and ritonavir 100 mg one tablet twice daily for 5 days) Patient GFR is 78, Disp: 30 tablet, Rfl: 0   RABEprazole (ACIPHEX) 20 MG tablet, Take 1 tablet (20 mg total) by mouth daily., Disp: 30 tablet, Rfl: 2  EXAM: This was a telephone visit and no physical exam was completed.   ASSESSMENT AND PLAN:  Discussed the following assessment and plan:  Problem List Items Addressed This Visit     COVID-19 - Primary    Patient with positive home COVID testing COVID symptoms.  Discussed quarantine lasting through 11/07/2020.  Advised she can come off of quarantine if she had improved symptoms with no fever over the past 24 hours prior to the end of quarantine.  We will treat with paxlovid.  This was sent to her local pharmacy though she will let us know if they are not able to get this for her.  I did discuss the potential side effects of altered taste, diarrhea, hypertension, and myalgias.  I also discussed the potential for worsening symptoms after coming off of paxlovid.  We will treat her cough with Hycodan.  Controlled substance database reviewed.  She will seek medical attention for shortness of breath, cough productive of blood, or fevers greater than 103 F.  If she worsens she will let us know.  She noted her husband had allergy-like symptoms yesterday and had a negative home test yesterday and today.  I encouraged her  to have him quarantine as well and retest on Thursday.       Relevant Medications   nirmatrelvir/ritonavir EUA (PAXLOVID) TABS   HYDROcodone bit-homatropine (HYCODAN) 5-1.5 MG/5ML syrup    No follow-ups on file.   I discussed the assessment and treatment plan with the patient. The patient was provided an opportunity to ask questions and all were answered. The patient agreed with the plan and demonstrated an understanding of the instructions.   The patient was advised to call back or seek an in-person evaluation if the symptoms worsen or if the condition fails to improve as anticipated.  I provided 11 minutes of non-face-to-face time during this encounter.   Tommi Rumps, MD

## 2020-10-31 NOTE — Telephone Encounter (Signed)
Tussionex sent to pharmacy 

## 2020-10-31 NOTE — Telephone Encounter (Signed)
[facility] called to let Dr Nicki Reaper know they can not refill HYDROcodone bit-homatropine (HYCODAN) 5-1.5 MG/5ML syrup, because there is national out of stock, long term back order. Please send a new prescription for a different medication.

## 2020-11-04 ENCOUNTER — Other Ambulatory Visit: Payer: Self-pay | Admitting: Internal Medicine

## 2020-11-09 ENCOUNTER — Other Ambulatory Visit
Admission: RE | Admit: 2020-11-09 | Discharge: 2020-11-09 | Disposition: A | Discharge status: Home / Self Care | Payer: Medicare PPO | Source: Ambulatory Visit | Attending: Orthopedic Surgery | Admitting: Orthopedic Surgery

## 2020-11-09 ENCOUNTER — Other Ambulatory Visit: Payer: Self-pay

## 2020-11-09 HISTORY — DX: Family history of other specified conditions: Z84.89

## 2020-11-09 NOTE — Patient Instructions (Addendum)
Your procedure is scheduled on: 11/16/20 Thursday Report to the Registration Desk on the 1st floor of the [facility]. To find out your arrival time, please call [phone] between 1PM - 3PM on: 11/15/20 - Wednesday Report to [facility] for EKG on 11/10/20 at 1030 am.  REMEMBER: Instructions that are not followed completely may result in serious medical risk, up to and including death; or upon the discretion of your surgeon and anesthesiologist your surgery may need to be rescheduled.  Do not eat food after midnight the night before surgery.  No gum chewing, lozengers or hard candies.  You may however, drink CLEAR liquids up to 2 hours before you are scheduled to arrive for your surgery. Do not drink anything within 2 hours of your scheduled arrival time.  Clear liquids include: - water  - apple juice without pulp - gatorade (not RED, PURPLE, OR BLUE) - black coffee or tea (Do NOT add milk or creamers to the coffee or tea) Do NOT drink anything that is not on this list.  Type 1 and Type 2 diabetics should only drink water.  In addition, your doctor has ordered for you to drink the provided  Ensure Pre-Surgery Clear Carbohydrate Drink  Drinking this carbohydrate drink up to two hours before surgery helps to reduce insulin resistance and improve patient outcomes. Please complete drinking 2 hours prior to scheduled arrival time.  TAKE THESE MEDICATIONS THE MORNING OF SURGERY WITH A SIP OF WATER:  - RABEprazole (ACIPHEX) 20 MG tablet   One week prior to surgery: Stop Anti-inflammatories (NSAIDS) such as Advil, Aleve, Ibuprofen, Motrin, Naproxen, Naprosyn and Aspirin based products such as Excedrin, Goodys Powder, BC Powder.  Stop ANY OVER THE COUNTER supplements until after surgery.  You may take Tylenol if needed for pain up until the day of surgery.  No Alcohol for 24 hours before or after surgery.  No Smoking including e-cigarettes for 24 hours prior to surgery.  No  chewable tobacco products for at least 6 hours prior to surgery.  No nicotine patches on the day of surgery.  Do not use any "recreational" drugs for at least a week prior to your surgery.  Please be advised that the combination of cocaine and anesthesia may have negative outcomes, up to and including death. If you test positive for cocaine, your surgery will be cancelled.  On the morning of surgery brush your teeth with toothpaste and water, you may rinse your mouth with mouthwash if you wish. Do not swallow any toothpaste or mouthwash.  Do not wear jewelry, make-up, hairpins, clips or nail polish.  Do not wear lotions, powders, or perfumes.   Do not shave body from the neck down 48 hours prior to surgery just in case you cut yourself which could leave a site for infection.  Also, freshly shaved skin may become irritated if using the CHG soap.  Contact lenses, hearing aids and dentures may not be worn into surgery.  Do not bring valuables to the hospital. [facility] is not responsible for any missing/lost belongings or valuables.   Use CHG Soap or wipes as directed on instruction sheet.  Notify your doctor if there is any change in your medical condition (cold, fever, infection).  Wear comfortable clothing (specific to your surgery type) to the hospital.  After surgery, you can help prevent lung complications by doing breathing exercises.  Take deep breaths and cough every 1-2 hours. Your doctor may order a device called an Chiropodist to  help you take deep breaths. When coughing or sneezing, hold a pillow firmly against your incision with both hands. This is called "splinting." Doing this helps protect your incision. It also decreases belly discomfort.  If you are being admitted to the hospital overnight, leave your suitcase in the car. After surgery it may be brought to your room.  If you are being discharged the day of surgery, you will not be allowed to drive  home. You will need a responsible adult (18 years or older) to drive you home and stay with you that night.   If you are taking public transportation, you will need to have a responsible adult (18 years or older) with you. Please confirm with your physician that it is acceptable to use public transportation.   Please call the [place] Dept. at [phone] if you have any questions about these instructions.  Surgery Visitation Policy:  Patients undergoing a surgery or procedure may have one family member or support person with them as long as that person is not COVID-19 positive or experiencing its symptoms.  That person may remain in the waiting area during the procedure.  Inpatient Visitation:    Visiting hours are 7 a.m. to 8 p.m. Inpatients will be allowed two visitors daily. The visitors may change each day during the patient's stay. No visitors under the age of 39. Any visitor under the age of 29 must be accompanied by an adult. The visitor must pass COVID-19 screenings, use hand sanitizer when entering and exiting the patient's room and wear a mask at all times, including in the patient's room. Patients must also wear a mask when staff or their visitor are in the room. Masking is required regardless of vaccination status.

## 2020-11-10 ENCOUNTER — Other Ambulatory Visit
Admission: RE | Admit: 2020-11-10 | Discharge: 2020-11-10 | Disposition: A | Discharge status: Home / Self Care | Payer: Medicare PPO | Source: Ambulatory Visit | Attending: Orthopedic Surgery | Admitting: Orthopedic Surgery

## 2020-11-10 DIAGNOSIS — Z01818 Encounter for other preprocedural examination: Secondary | ICD-10-CM | POA: Diagnosis not present

## 2020-11-16 ENCOUNTER — Ambulatory Visit
Admission: RE | Admit: 2020-11-16 | Discharge: 2020-11-16 | Disposition: A | Discharge status: Home / Self Care | Payer: Medicare PPO | Attending: Orthopedic Surgery | Admitting: Orthopedic Surgery

## 2020-11-16 ENCOUNTER — Other Ambulatory Visit: Payer: Self-pay

## 2020-11-16 ENCOUNTER — Encounter
Admission: RE | Disposition: A | Discharge status: Home / Self Care | Payer: Self-pay | Source: Home / Self Care | Attending: Orthopedic Surgery

## 2020-11-16 ENCOUNTER — Encounter: Payer: Self-pay | Admitting: Orthopedic Surgery

## 2020-11-16 ENCOUNTER — Ambulatory Visit: Payer: Medicare PPO | Admitting: Anesthesiology

## 2020-11-16 ENCOUNTER — Ambulatory Visit: Payer: Medicare PPO

## 2020-11-16 DIAGNOSIS — Z881 Allergy status to other antibiotic agents status: Secondary | ICD-10-CM | POA: Diagnosis not present

## 2020-11-16 DIAGNOSIS — M1811 Unilateral primary osteoarthritis of first carpometacarpal joint, right hand: Secondary | ICD-10-CM | POA: Diagnosis not present

## 2020-11-16 DIAGNOSIS — Z79899 Other long term (current) drug therapy: Secondary | ICD-10-CM | POA: Diagnosis not present

## 2020-11-16 DIAGNOSIS — N760 Acute vaginitis: Secondary | ICD-10-CM

## 2020-11-16 DIAGNOSIS — Z791 Long term (current) use of non-steroidal anti-inflammatories (NSAID): Secondary | ICD-10-CM | POA: Insufficient documentation

## 2020-11-16 DIAGNOSIS — Z471 Aftercare following joint replacement surgery: Secondary | ICD-10-CM | POA: Diagnosis not present

## 2020-11-16 DIAGNOSIS — Z96691 Finger-joint replacement of right hand: Secondary | ICD-10-CM

## 2020-11-16 DIAGNOSIS — L409 Psoriasis, unspecified: Secondary | ICD-10-CM

## 2020-11-16 DIAGNOSIS — Z79890 Hormone replacement therapy (postmenopausal): Secondary | ICD-10-CM | POA: Insufficient documentation

## 2020-11-16 DIAGNOSIS — Z96631 Presence of right artificial wrist joint: Secondary | ICD-10-CM | POA: Diagnosis not present

## 2020-11-16 DIAGNOSIS — N898 Other specified noninflammatory disorders of vagina: Secondary | ICD-10-CM

## 2020-11-16 HISTORY — PX: CARPOMETACARPAL (CMC) FUSION OF THUMB: SHX6290

## 2020-11-16 SURGERY — CARPOMETACARPAL (CMC) FUSION OF THUMB
Anesthesia: General | Site: Thumb | Laterality: Right

## 2020-11-16 MED ORDER — PHENYLEPHRINE HCL (PRESSORS) 10 MG/ML IV SOLN
INTRAVENOUS | Status: AC
  Filled 2020-11-16: qty 1

## 2020-11-16 MED ORDER — NEOMYCIN-POLYMYXIN B GU 40-200000 IR SOLN
Status: AC
  Filled 2020-11-16: qty 2

## 2020-11-16 MED ORDER — FENTANYL CITRATE (PF) 100 MCG/2ML IJ SOLN
25.0000 ug | INTRAMUSCULAR | Status: AC | PRN
  Administered 2020-11-16 (×3): 25 ug via INTRAVENOUS
  Administered 2020-11-16: 50 ug via INTRAVENOUS

## 2020-11-16 MED ORDER — LIDOCAINE HCL (PF) 2 % IJ SOLN
INTRAMUSCULAR | Status: AC
  Filled 2020-11-16: qty 5

## 2020-11-16 MED ORDER — FENTANYL CITRATE (PF) 100 MCG/2ML IJ SOLN
INTRAMUSCULAR | Status: AC
  Filled 2020-11-16: qty 2

## 2020-11-16 MED ORDER — EPHEDRINE SULFATE 50 MG/ML IJ SOLN
INTRAMUSCULAR | Status: DC | PRN
  Administered 2020-11-16: 10 mg via INTRAVENOUS

## 2020-11-16 MED ORDER — DESONIDE 0.05 % EX CREA: 1.0000 "application " | TOPICAL_CREAM | Freq: Two times a day (BID) | CUTANEOUS | Status: DC | PRN

## 2020-11-16 MED ORDER — PROPOFOL 10 MG/ML IV BOLUS
INTRAVENOUS | Status: AC
  Filled 2020-11-16: qty 40

## 2020-11-16 MED ORDER — DEXAMETHASONE SODIUM PHOSPHATE 10 MG/ML IJ SOLN
INTRAMUSCULAR | Status: AC
  Filled 2020-11-16: qty 1

## 2020-11-16 MED ORDER — PROMETHAZINE HCL 25 MG/ML IJ SOLN: 6.2500 mg | INTRAMUSCULAR | Status: DC | PRN

## 2020-11-16 MED ORDER — BUPIVACAINE HCL (PF) 0.5 % IJ SOLN
INTRAMUSCULAR | Status: AC
  Filled 2020-11-16: qty 30

## 2020-11-16 MED ORDER — PROPOFOL 10 MG/ML IV BOLUS
INTRAVENOUS | Status: DC | PRN
  Administered 2020-11-16: 100 mg via INTRAVENOUS

## 2020-11-16 MED ORDER — OXYCODONE HCL 5 MG PO TABS
ORAL_TABLET | ORAL | Status: AC
  Filled 2020-11-16: qty 1

## 2020-11-16 MED ORDER — CHLORHEXIDINE GLUCONATE 0.12 % MT SOLN
OROMUCOSAL | Status: AC
  Administered 2020-11-16: 15 mL via OROMUCOSAL
  Filled 2020-11-16: qty 15

## 2020-11-16 MED ORDER — OXYCODONE HCL 5 MG PO TABS
5.0000 mg | ORAL_TABLET | Freq: Once | ORAL | Status: AC | PRN
Start: 2020-11-16 — End: 2020-11-16
  Administered 2020-11-16: 5 mg via ORAL

## 2020-11-16 MED ORDER — DEXAMETHASONE SODIUM PHOSPHATE 10 MG/ML IJ SOLN
INTRAMUSCULAR | Status: DC | PRN
  Administered 2020-11-16: 10 mg via INTRAVENOUS

## 2020-11-16 MED ORDER — HYDROCODONE-ACETAMINOPHEN 5-325 MG PO TABS: 1.0000 | ORAL_TABLET | Freq: Every day | ORAL | 0 refills | Status: DC | PRN

## 2020-11-16 MED ORDER — CHLORHEXIDINE GLUCONATE 0.12 % MT SOLN: 15.0000 mL | Freq: Once | OROMUCOSAL | Status: AC

## 2020-11-16 MED ORDER — EPHEDRINE 5 MG/ML INJ
INTRAVENOUS | Status: AC
  Filled 2020-11-16: qty 5

## 2020-11-16 MED ORDER — ONDANSETRON HCL 4 MG/2ML IJ SOLN
INTRAMUSCULAR | Status: DC | PRN
  Administered 2020-11-16: 4 mg via INTRAVENOUS

## 2020-11-16 MED ORDER — MIDAZOLAM HCL 2 MG/2ML IJ SOLN
INTRAMUSCULAR | Status: DC | PRN
  Administered 2020-11-16: 2 mg via INTRAVENOUS

## 2020-11-16 MED ORDER — ACETAMINOPHEN 10 MG/ML IV SOLN: 1000.0000 mg | Freq: Once | INTRAVENOUS | Status: DC | PRN

## 2020-11-16 MED ORDER — METOCLOPRAMIDE HCL 5 MG/ML IJ SOLN: 5.0000 mg | Freq: Three times a day (TID) | INTRAMUSCULAR | Status: DC | PRN

## 2020-11-16 MED ORDER — 0.9 % SODIUM CHLORIDE (POUR BTL) OPTIME
TOPICAL | Status: DC | PRN
  Administered 2020-11-16: 500 mL

## 2020-11-16 MED ORDER — OXYCODONE HCL 5 MG/5ML PO SOLN
5.0000 mg | Freq: Once | ORAL | Status: AC | PRN
Start: 2020-11-16 — End: 2020-11-16

## 2020-11-16 MED ORDER — ORAL CARE MOUTH RINSE: 15.0000 mL | Freq: Once | OROMUCOSAL | Status: AC

## 2020-11-16 MED ORDER — BUPIVACAINE HCL (PF) 0.5 % IJ SOLN
INTRAMUSCULAR | Status: DC | PRN
  Administered 2020-11-16: 10 mL

## 2020-11-16 MED ORDER — HYDROCODONE-ACETAMINOPHEN 5-325 MG PO TABS: 1.0000 | ORAL_TABLET | ORAL | 0 refills | Status: DC | PRN

## 2020-11-16 MED ORDER — ONDANSETRON HCL 4 MG/2ML IJ SOLN: 4.0000 mg | Freq: Four times a day (QID) | INTRAMUSCULAR | Status: DC | PRN

## 2020-11-16 MED ORDER — CEFAZOLIN SODIUM-DEXTROSE 2-4 GM/100ML-% IV SOLN
2.0000 g | INTRAVENOUS | Status: AC
Start: 2020-11-16 — End: 2020-11-16
  Administered 2020-11-16: 2 g via INTRAVENOUS

## 2020-11-16 MED ORDER — FENTANYL CITRATE (PF) 100 MCG/2ML IJ SOLN
INTRAMUSCULAR | Status: AC
  Administered 2020-11-16: 50 ug via INTRAVENOUS
  Filled 2020-11-16: qty 2

## 2020-11-16 MED ORDER — MIDAZOLAM HCL 2 MG/2ML IJ SOLN
INTRAMUSCULAR | Status: AC
  Filled 2020-11-16: qty 2

## 2020-11-16 MED ORDER — METOCLOPRAMIDE HCL 10 MG PO TABS: 5.0000 mg | ORAL_TABLET | Freq: Three times a day (TID) | ORAL | Status: DC | PRN

## 2020-11-16 MED ORDER — FENTANYL CITRATE (PF) 100 MCG/2ML IJ SOLN
INTRAMUSCULAR | Status: AC
  Administered 2020-11-16: 25 ug via INTRAVENOUS
  Filled 2020-11-16: qty 2

## 2020-11-16 MED ORDER — LIDOCAINE HCL (CARDIAC) PF 100 MG/5ML IV SOSY
PREFILLED_SYRINGE | INTRAVENOUS | Status: DC | PRN
  Administered 2020-11-16: 60 mg via INTRAVENOUS

## 2020-11-16 MED ORDER — EUCRISA 2 % EX OINT: 1.0000 "application " | TOPICAL_OINTMENT | Freq: Two times a day (BID) | CUTANEOUS | Status: DC | PRN

## 2020-11-16 MED ORDER — ONDANSETRON HCL 4 MG/2ML IJ SOLN
INTRAMUSCULAR | Status: AC
  Filled 2020-11-16: qty 2

## 2020-11-16 MED ORDER — CEFAZOLIN SODIUM-DEXTROSE 2-4 GM/100ML-% IV SOLN
INTRAVENOUS | Status: AC
  Filled 2020-11-16: qty 100

## 2020-11-16 MED ORDER — FENTANYL CITRATE (PF) 100 MCG/2ML IJ SOLN
INTRAMUSCULAR | Status: DC | PRN
  Administered 2020-11-16 (×2): 50 ug via INTRAVENOUS

## 2020-11-16 MED ORDER — NEOMYCIN-POLYMYXIN B GU 40-200000 IR SOLN
Status: DC | PRN
  Administered 2020-11-16: 2 mL

## 2020-11-16 MED ORDER — DROPERIDOL 2.5 MG/ML IJ SOLN
0.6250 mg | Freq: Once | INTRAMUSCULAR | Status: DC | PRN
  Filled 2020-11-16: qty 0.25

## 2020-11-16 MED ORDER — GELATIN ABSORBABLE 12-7 MM EX MISC
CUTANEOUS | Status: DC | PRN
  Administered 2020-11-16: 1

## 2020-11-16 MED ORDER — LACTATED RINGERS IV SOLN: INTRAVENOUS | Status: DC

## 2020-11-16 MED ORDER — ONDANSETRON HCL 4 MG PO TABS: 4.0000 mg | ORAL_TABLET | Freq: Four times a day (QID) | ORAL | Status: DC | PRN

## 2020-11-16 MED ORDER — MEPERIDINE HCL 25 MG/ML IJ SOLN: 6.2500 mg | INTRAMUSCULAR | Status: DC | PRN

## 2020-11-16 MED ORDER — SODIUM CHLORIDE 0.9 % IV SOLN: INTRAVENOUS | Status: DC

## 2020-11-16 MED ORDER — GELATIN ABSORBABLE 12-7 MM EX MISC
CUTANEOUS | Status: AC
  Filled 2020-11-16: qty 1

## 2020-11-16 SURGICAL SUPPLY — 36 items
APL PRP STRL LF DISP 70% ISPRP (MISCELLANEOUS) ×2
BLADE OSC/SAGITTAL 5.5X25 (BLADE) ×2 IMPLANT
BNDG CMPR STD VLCR NS LF 5.8X3 (GAUZE/BANDAGES/DRESSINGS) ×1
BNDG CONFORM 3 STRL LF (GAUZE/BANDAGES/DRESSINGS) ×2 IMPLANT
BNDG ELASTIC 3X5.8 VLCR NS LF (GAUZE/BANDAGES/DRESSINGS) ×2 IMPLANT
CAST PADDING 3X4FT ST 30246 (SOFTGOODS) ×1
CHLORAPREP W/TINT 26 (MISCELLANEOUS) ×3 IMPLANT
CUFF TOURN SGL QUICK 18X4 (TOURNIQUET CUFF) ×1 IMPLANT
DRAPE FLUOR MINI C-ARM 54X84 (DRAPES) ×2 IMPLANT
ELECT CAUTERY BLADE 6.4 (BLADE) ×2 IMPLANT
GAUZE 4X4 16PLY ~~LOC~~+RFID DBL (SPONGE) ×2 IMPLANT
GAUZE SPONGE 4X4 12PLY STRL (GAUZE/BANDAGES/DRESSINGS) ×2 IMPLANT
GAUZE XEROFORM 1X8 LF (GAUZE/BANDAGES/DRESSINGS) ×2 IMPLANT
GLOVE SURG SYN 9.0  PF PI (GLOVE) ×2
GLOVE SURG SYN 9.0 PF PI (GLOVE) ×1 IMPLANT
GOWN SRG 2XL LVL 4 RGLN SLV (GOWNS) ×1 IMPLANT
GOWN STRL NON-REIN 2XL LVL4 (GOWNS) ×2
GOWN STRL REUS W/ TWL LRG LVL3 (GOWN DISPOSABLE) ×1 IMPLANT
GOWN STRL REUS W/TWL LRG LVL3 (GOWN DISPOSABLE) ×2
KIT TURNOVER KIT A (KITS) ×2 IMPLANT
MANIFOLD NEPTUNE II (INSTRUMENTS) ×2 IMPLANT
NS IRRIG 500ML POUR BTL (IV SOLUTION) ×2 IMPLANT
PACK EXTREMITY ARMC (MISCELLANEOUS) ×2 IMPLANT
PAD CAST CTTN 3X4 STRL (SOFTGOODS) ×1 IMPLANT
PADDING CAST COTTON 3X4 STRL (SOFTGOODS) ×1
SCALPEL PROTECTED #15 DISP (BLADE) ×4 IMPLANT
SPLINT CAST 1 STEP 3X12 (MISCELLANEOUS) ×2 IMPLANT
SPLINT WRIST M LT TX990308 (SOFTGOODS) ×2 IMPLANT
SUT ETHILON 4-0 (SUTURE) ×2
SUT ETHILON 4-0 FS2 18XMFL BLK (SUTURE) ×1
SUT VIC AB 0 CT2 27 (SUTURE) ×4 IMPLANT
SUT VIC AB 3-0 SH 27 (SUTURE) ×2
SUT VIC AB 3-0 SH 27X BRD (SUTURE) ×1 IMPLANT
SUTURE ETHLN 4-0 FS2 18XMF BLK (SUTURE) ×1 IMPLANT
SYSTEM IMPLANT TIGHTROPE MINI (Anchor) ×2 IMPLANT
WIRE Z .062 C-WIRE SPADE TIP (WIRE) ×2 IMPLANT

## 2020-11-16 NOTE — Op Note (Signed)
11/16/2020  2:53 PM  PATIENT:  April Santos  65 y.o. female  PRE-OPERATIVE DIAGNOSIS:  Primary osteoarthritis of first carpometacarpal joint of right hand M18.11  POST-OPERATIVE DIAGNOSIS:  Primary osteoarthritis of first carpometacarpal joint of right hand M18.11  PROCEDURE:  Procedure(s): Right CARPOMETACARPAL (CMC) ARTHROPLASTY (Right)  SURGEON: Laurene Footman, MD  ASSISTANTS: none  ANESTHESIA:   general  EBL:  Total I/O In: 800 [I.V.:700; IV Piggyback:100] Out: 5 [Blood:5]  BLOOD ADMINISTERED:none  DRAINS: none   LOCAL MEDICATIONS USED:  MARCAINE     SPECIMEN:  No Specimen  DISPOSITION OF SPECIMEN:  N/A  COUNTS:  YES  TOURNIQUET:   Total Tourniquet Time Documented: Upper Arm (Right) - 57 minutes Total: Upper Arm (Right) - 57 minutes   IMPLANTS: Arthrex mini tight rope x1  DICTATION: .Dragon Dictation  patient was brought to the operating room and general anesthesia was obtained.  Arm was prepped and draped in the usual sterile fashion with a tourniquet applied the upper arm.  After patient identification and timeout procedures were completed tourniquet was raised and incision was made on the junction of the volar and dorsal skin over the [facility] joint.  Approximately 2 and half centimeters in length.  Subcutaneous nerves were preserved as much as possible.  The capsule was opened and there was a great deal fluid present which was evacuated mini C-arm was used to make certain we are in the appropriate position between the thumb [facility] and scaphoid this was followed by sectioning the trapezium into 4 pieces and removing it and pieces this was somewhat difficult with a large fragment of between the base of the first and second metacarpals.  After this was completed a wire was placed from the base of the first metacarpal into the proximal second metacarpal the wire was brought out through a small incision in the skin and the mini tight rope passed with a knot placed on the  second metacarpal device held in position with the thumb in slight abduction appropriate position was obtained and this was then tightened.  The thumb CMC joint did not subluxed with pressure under fluoroscopy.  Following this the sutures were cut short on the mini tight rope at the second metacarpal wound was thoroughly irrigated and Gelfoam placed within the space of the trapezium had been removed the capsule was repaired with 0 Vicryl with 4-0 nylon for the skin followed by Xeroform 4 x 4's web roll radial gutter splint and Ace wrap tourniquet let down to close the case.    PLAN OF CARE: Discharge to home after PACU  PATIENT DISPOSITION:  PACU - hemodynamically stable.

## 2020-11-16 NOTE — Anesthesia Preprocedure Evaluation (Addendum)
Anesthesia Evaluation  Patient identified by MRN, date of birth, ID band Patient awake    Reviewed: Allergy & Precautions, NPO status , Patient's Chart, lab work & pertinent test results  Airway Mallampati: II  TM Distance: >3 FB Neck ROM: full    Dental no notable dental hx.    Pulmonary neg pulmonary ROS,    Pulmonary exam normal        Cardiovascular Exercise Tolerance: Good negative cardio ROS Normal cardiovascular exam Rhythm:Regular     Neuro/Psych  Neuromuscular disease (Sciatica ) negative psych ROS   GI/Hepatic Neg liver ROS, GERD  Medicated and Controlled,  Endo/Other  negative endocrine ROS  Renal/GU      Musculoskeletal  (+) Arthritis , Osteoarthritis,    Abdominal Normal abdominal exam  (+)   Peds  Hematology negative hematology ROS (+)   Anesthesia Other Findings Past Medical History: No date: Arthritis No date: Colon polyps 2022: COVID-19 No date: Disc disorder No date: Dysphagia No date: Family history of adverse reaction to anesthesia     Comment:  mother nausea No date: GERD (gastroesophageal reflux disease) No date: History of uterine fibroid No date: Psoriasis  Past Surgical History: No date: ABDOMINAL HYSTERECTOMY 1999: BREAST SURGERY     Comment:  biopsy 2005: carpal     Comment:  Tunnel right hand No date: COLONOSCOPY WITH ESOPHAGOGASTRODUODENOSCOPY (EGD) 06/10/2016: COLONOSCOPY WITH PROPOFOL; N/A     Comment:  Procedure: COLONOSCOPY WITH PROPOFOL;  Surgeon: Manya Silvas, MD;  Location: [facility] ENDOSCOPY;  Service:               Endoscopy;  Laterality: N/A; 06/10/2016: ESOPHAGOGASTRODUODENOSCOPY (EGD) WITH PROPOFOL; N/A     Comment:  Procedure: ESOPHAGOGASTRODUODENOSCOPY (EGD) WITH               PROPOFOL;  Surgeon: Manya Silvas, MD;  Location: [facility]              ENDOSCOPY;  Service: Endoscopy;  Laterality: N/A; 02/07/1003: FOOT SURGERY     Comment:  right foot  benign tumor 08/17/13 & 04/13/14: SACROILIAC JOINT INJECTION     Comment:  Dr. Sharlet Salina No date: SIGMOIDOSCOPY 1997: UTERINE FIBROID SURGERY     Reproductive/Obstetrics negative OB ROS                            Anesthesia Physical Anesthesia Plan  ASA: 2  Anesthesia Plan: General LMA   Post-op Pain Management:    Induction: Intravenous  PONV Risk Score and Plan: 2 and Dexamethasone, Ondansetron, Midazolam and Treatment may vary due to age or medical condition  Airway Management Planned: LMA  Additional Equipment:   Intra-op Plan:   Post-operative Plan:   Informed Consent: I have reviewed the patients History and Physical, chart, labs and discussed the procedure including the risks, benefits and alternatives for the proposed anesthesia with the patient or authorized representative who has indicated his/her understanding and acceptance.     Dental Advisory Given  Plan Discussed with: Anesthesiologist and CRNA  Anesthesia Plan Comments:        Anesthesia Quick Evaluation

## 2020-11-16 NOTE — H&P (Signed)
April Santos, [place] - 10/18/2020 2:15 PM EDT Formatting of this note might be different from the original. Review of Systems  Constitutional: Positive for activity change.  Musculoskeletal: Positive for arthralgias, back pain, gait problem, joint swelling and myalgias.  Neurological: Positive for weakness.  All other systems reviewed and are negative.  Electronically signed by April Santos, [place] at 10/18/2020 6:53 PM EDT  Back to top of Progress Notes Lauris Poag, MD - 10/18/2020 2:15 PM EDT Formatting of this note is different from the original.  Chief Complaint  Patient presents with   Right Thumb - Pain    History of the Present Illness: April Santos is a 65 y.o. female here today.   The patient presents for evaluation of right thumb CMC arthritis. Her last x-rays were on 09/17/2018. At that point, she had subluxation, severe arthritis, with extensive subchondral cyst formation in the base of the 1st metacarpal, as well as in the trapezium, and complete loss of joint space. She has had multiple injections, and now comes in to discuss [facility] arthroplasty.  The patient states she has had at least a 6 injections in the last 2 years. Rachelle Hora, PA-C asked her about surgery about 1 year ago, but she was not sure. She states she has been wearing the brace continually per Rachelle Hora advice.  The patient is retired. 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 She enjoys gardening, walking, and riding her bike.   I have reviewed past medical, surgical, social and family history, and allergies as documented in the EMR.  Past Medical History: Past Medical History:  Diagnosis Date   Anemia   Breast lump  Benign   Chickenpox   Colon polyps   Disc disorder   Dysphagia   Environmental allergies   Fibroid tumor   GERD (gastroesophageal reflux disease)   Osteoarthritis   Psoriasis   Past Surgical History: Past Surgical History:  Procedure Laterality Date   BREAST  SURGERY   COLONOSCOPY 04/12/2005  Condyloma Acuminatum   COLONOSCOPY 06/10/2016  Int Hemorrhoids, Diverticulosis: CBF 06/2026   EGD 07/16/2005  Normal   EGD 06/10/2016  Gastritis: No repeat per RTE   ENDOSCOPIC CARPAL TUNNEL RELEASE Right   FLEXIBLE SIGMOIDOSCOPY 06/05/2005   foot surgery Right 2000   Removal of fibroid tumors 1997   sacroliliac joint injection  08-17-2013 and 04-13-2014   Status post hysterectomy   Tumor removed from foot Right  Benign   Past Family History: Family History  Problem Relation Age of Onset   Breast cancer Mother   Myocardial Infarction (Heart attack) Father   Arthritis Father   Reflux disease Father   Colon cancer Neg Hx   Medications: Current Outpatient Medications Ordered in Epic  Medication Sig Dispense Refill   ALPRAZolam (XANAX) 0.25 MG tablet Take 0.25 mg by mouth once daily as needed for Sleep.   BIOTIN ORAL Take 1 tablet by mouth once daily   CALCIUM CITRATE/VITAMIN D3 (CITRACAL + D ORAL) Take 2 tablets by mouth 2 (two) times daily.   desonide (DESOWEN) 0.05 % cream Apply topically 2 (two) times daily   DOTTI 0.05 mg/24 hr semiwkly patch   estradiol (ESTRACE) 0.01 % (0.1 mg/gram) vaginal cream Place 2 g vaginally twice a week.   estradiol (ESTRACE) 1 MG tablet Take 1 mg by mouth once daily.   EUCRISA 2 % Oint Apply topically as needed   famotidine (PEPCID) 40 MG tablet Take 1 tablet by mouth once daily   gabapentin (  NEURONTIN) 300 MG capsule Take 1 capsule (300 mg total) by mouth nightly 1 capsule at night x 4 days, then progress to 1 capsule at night and 1capsule in the morning. 60 capsule 0   HYDROcodone-acetaminophen (NORCO) 5-325 mg tablet Take 1 tablet by mouth every 6 (six) hours as needed 20 tablet 0   ibuprofen (ADVIL,MOTRIN) 200 MG tablet Take 200 mg by mouth every 6 (six) hours as needed   meloxicam (MOBIC) 15 MG tablet Take 1 tablet (15 mg total) by mouth once daily 30 tablet 0   montelukast (SINGULAIR) 10 mg tablet Take 1  tablet by mouth once daily   multivitamin tablet Take 1 tablet by mouth once daily.   OTEZLA 30 mg tablet Take 1 tablet by mouth 2 (two) times daily   pantoprazole (PROTONIX) 40 MG DR tablet Take 40 mg by mouth once daily.   RABEprazole (ACIPHEX) 20 mg EC tablet   No current Epic-ordered facility-administered medications on file.   Allergies: Allergies  Allergen Reactions   Erythromycin Nausea   Omnicef [Cefdinir] Hives    Body mass index is 26.76 kg/m.  Review of Systems: A comprehensive 14 point ROS was performed, reviewed, and the pertinent orthopaedic findings are documented in the HPI.  Vitals:  10/18/20 1410  BP: 130/82    General Physical Examination:   General/Constitutional: No apparent distress: well-nourished and well developed. Eyes: Pupils equal, round with synchronous movement. Lungs: Clear to auscultation HEENT: Normal Vascular: No edema, swelling or tenderness, except as noted in detailed exam. Cardiac: Heart rate and rhythm is regular. Integumentary: No impressive skin lesions present, except as noted in detailed exam. Neuro/Psych: Normal mood and affect, oriented to person, place and time.  Musculoskeletal Examination:  On exam, right thumb stiffness with 25 degrees extension, 30 degrees abduction. Instability and positive grind test.  Radiographs:  No new imaging studies were obtained today.  Assessment: ICD-10-CM  1. Primary osteoarthritis of first carpometacarpal joint of right hand M18.11   Plan:  The patient has clinical findings of right thumb CMC arthritis.  We discussed the patient's treatment options. I recommend right thumb CMC arthroplasty. I explained the surgery and postoperative course in detail. The patient would like to proceed with surgery.  We will schedule the patient for surgery in the near future.  Surgical Risks:  The nature of the condition and the proposed procedure has been reviewed in detail with the patient.  Surgical versus non-surgical options and prognosis for recovery have been reviewed and the inherent risks and benefits of each have been discussed including the risks of infection, bleeding, injury to nerves/blood vessels/tendons, incomplete relief of symptoms, persisting pain and/or stiffness, loss of function, complex regional pain syndrome, failure of the procedure, as appropriate.  Attestation: I, Dawn Royse, am documenting for [employer], MD utilizing [place].    Electronically signed by Lauris Poag, MD at 10/18/2020 6:53 PM EDT   Reviewed  H+P. No changes noted.

## 2020-11-16 NOTE — Anesthesia Procedure Notes (Signed)
Procedure Name: LMA Insertion Date/Time: 11/16/2020 1:30 PM Performed by: Esaw Grandchild, CRNA Pre-anesthesia Checklist: Patient identified, Emergency Drugs available, Suction available, Patient being monitored and Timeout performed Patient Re-evaluated:Patient Re-evaluated prior to induction Oxygen Delivery Method: Circle system utilized Preoxygenation: Pre-oxygenation with 100% oxygen Induction Type: IV induction Ventilation: Mask ventilation without difficulty LMA: LMA inserted LMA Size: 4.0 Number of attempts: 1 Airway Equipment and Method: Patient positioned with wedge pillow Tube secured with: Tape Dental Injury: Teeth and Oropharynx as per pre-operative assessment

## 2020-11-16 NOTE — Discharge Instructions (Addendum)
AMBULATORY SURGERY  DISCHARGE INSTRUCTIONS   The drugs that you were given will stay in your system until tomorrow so for the next 24 hours you should not:  Drive an automobile Make any legal decisions Drink any alcoholic beverage   You may resume regular meals tomorrow.  Today it is better to start with liquids and gradually work up to solid foods.  You may eat anything you prefer, but it is better to start with liquids, then soup and crackers, and gradually work up to solid foods.   Please notify your doctor immediately if you have any unusual bleeding, trouble breathing, redness and pain at the surgery site, drainage, fever, or pain not relieved by medication.    Additional Instructions:   Please contact your physician with any problems or Same Day Surgery at [phone], Monday through Friday 6 am to 4 pm, or [place] at [facility] number at [phone].     Keep arm elevated to minimize swelling Pain medicine as directed Ice to the wrist may help with pain as well Call office if you are having problems Keep dressing clean and dry

## 2020-11-16 NOTE — Transfer of Care (Signed)
Immediate Anesthesia Transfer of Care Note  Patient: April Santos  Procedure(s) Performed: Right CARPOMETACARPAL [facility]) ARTHROPLASTY (Right: Thumb)  Patient Location: PACU  Anesthesia Type:General  Level of Consciousness: awake, alert  and oriented  Airway & Oxygen Therapy: Patient Spontanous Breathing and Patient connected to face mask oxygen  Post-op Assessment: Report given to RN and Post -op Vital signs reviewed and stable  Post vital signs: Reviewed and stable  Last Vitals:  Vitals Value Taken Time  BP 143/89 11/16/20 1457  Temp    Pulse 74 11/16/20 1500  Resp 17 11/16/20 1500  SpO2 99 % 11/16/20 1500  Vitals shown include unvalidated device data.  Last Pain:  Vitals:   11/16/20 1058  TempSrc: Temporal  PainSc: 0-No pain         Complications: No notable events documented.

## 2020-11-17 ENCOUNTER — Encounter: Payer: Self-pay | Admitting: Orthopedic Surgery

## 2020-11-20 DIAGNOSIS — M1811 Unilateral primary osteoarthritis of first carpometacarpal joint, right hand: Secondary | ICD-10-CM | POA: Diagnosis not present

## 2020-11-20 NOTE — Anesthesia Postprocedure Evaluation (Signed)
Anesthesia Post Note  Patient: April Santos  Procedure(s) Performed: Right CARPOMETACARPAL (CMC) ARTHROPLASTY (Right: Thumb)  Patient location during evaluation: PACU Anesthesia Type: General Level of consciousness: awake and alert Pain management: pain level controlled Vital Signs Assessment: post-procedure vital signs reviewed and stable Respiratory status: spontaneous breathing, nonlabored ventilation and respiratory function stable Cardiovascular status: blood pressure returned to baseline and stable Postop Assessment: no apparent nausea or vomiting Anesthetic complications: no   No notable events documented.   Last Vitals:  Vitals:   11/16/20 1600 11/16/20 1610  BP: 116/76 124/66  Pulse: 72 67  Resp: 12 16  Temp: (!) 36.3 C (!) 36.2 C  SpO2: 94% 98%    Last Pain:  Vitals:   11/16/20 1610  TempSrc: Temporal  PainSc: [place]

## 2020-11-23 ENCOUNTER — Ambulatory Visit: Payer: Medicare PPO | Admitting: Dermatology

## 2020-11-24 ENCOUNTER — Ambulatory Visit: Payer: Medicare PPO | Admitting: Internal Medicine

## 2020-11-24 ENCOUNTER — Encounter: Payer: Self-pay | Admitting: Internal Medicine

## 2020-11-24 ENCOUNTER — Other Ambulatory Visit: Payer: Self-pay

## 2020-11-24 VITALS — BP 128/80 | HR 71 | Ht 60.98 in | Wt 140.0 lb

## 2020-11-24 DIAGNOSIS — L4050 Arthropathic psoriasis, unspecified: Secondary | ICD-10-CM | POA: Diagnosis not present

## 2020-11-24 DIAGNOSIS — M1811 Unilateral primary osteoarthritis of first carpometacarpal joint, right hand: Secondary | ICD-10-CM

## 2020-11-24 DIAGNOSIS — F439 Reaction to severe stress, unspecified: Secondary | ICD-10-CM

## 2020-11-24 DIAGNOSIS — Z13220 Encounter for screening for lipoid disorders: Secondary | ICD-10-CM

## 2020-11-24 DIAGNOSIS — K219 Gastro-esophageal reflux disease without esophagitis: Secondary | ICD-10-CM | POA: Diagnosis not present

## 2020-11-24 DIAGNOSIS — E611 Iron deficiency: Secondary | ICD-10-CM | POA: Diagnosis not present

## 2020-11-24 DIAGNOSIS — R739 Hyperglycemia, unspecified: Secondary | ICD-10-CM

## 2020-11-24 DIAGNOSIS — Z8616 Personal history of COVID-19: Secondary | ICD-10-CM

## 2020-11-24 NOTE — Progress Notes (Signed)
Patient ID: KAROLINA ZAMOR, female   DOB: [birth year], 65 y.o.   MRN: [id number]   Subjective:    Patient ID: [name], female    DOB: Mar 07, 1956, 65 y.o.   MRN: [id number]  HPI This visit occurred during the SARS-CoV-2 public health emergency.  Safety protocols were in place, including screening questions prior to the visit, additional usage of staff PPE, and extensive cleaning of exam room while observing appropriate contact time as indicated for disinfecting solutions.   Patient here for a scheduled follow up. History of right thumb arthritis.  S/p right thumb CMC arthroplasty.  Still has some swelling in her hand.  Incision sites healing well.  Swelling has improved.  She plans to follow-up next week.  Hip is better with pain medication.  She apparently is planned to have an epidural 12/06/2020.  Had COVID 10/28/2020.  Treated with Paxlovid.  Doing better.  No residual cough or congestion.  No chest pain, tightness or shortness of breath.  No nausea or vomiting.  No abdominal pain reported.  Peers to be handling stress.  Past Medical History:  Diagnosis Date   Arthritis    Colon polyps    COVID-19 2022   Disc disorder    Dysphagia    Family history of adverse reaction to anesthesia    mother nausea   GERD (gastroesophageal reflux disease)    History of uterine fibroid    Psoriasis    Past Surgical History:  Procedure Laterality Date   ABDOMINAL HYSTERECTOMY     BREAST SURGERY  1999   biopsy   carpal  2005   Tunnel right hand   CARPOMETACARPAL ([place]) FUSION OF THUMB Right 11/16/2020   Procedure: Right CARPOMETACARPAL [facility]) ARTHROPLASTY;  Surgeon: Hessie Knows, MD;  Location: [facility] ORS;  Service: Orthopedics;  Laterality: Right;   COLONOSCOPY WITH ESOPHAGOGASTRODUODENOSCOPY (EGD)     COLONOSCOPY WITH PROPOFOL N/A 06/10/2016   Procedure: COLONOSCOPY WITH PROPOFOL;  Surgeon: Manya Silvas, MD;  Location[facility] END[facility]COPY;  Service: Endoscopy;  Laterality: N/A;    ESOPHAGOGASTRODUODENOSCOPY (EGD) WITH PROPOFOL N/A 06/10/2016   Procedure: ESOPHAGOGASTRODUODENOSCOPY (EGD) WITH PROPOFOL;  Surgeon: Manya Silvas, MD;  Location: [facility] ENDOSCOPY;  Service: Endoscopy;  Laterality: N/A;   FOOT SURGERY  02/07/1003   right foot benign tumor   SACROILIAC JOINT INJECTION  08/17/13 & 04/13/14   Dr. Sharlet Salina   SIGMOIDOSCOPY     UTERINE FIBROID SURGERY  1997   Family History  Problem Relation Age of Onset   Breast cancer Mother        double masectomy   Arthritis Father    GER disease Father    Heart disease Other        paternal uncles   Colon cancer Neg Hx    Social History   Socioeconomic History   Marital status: Significant Other    Spouse name: Bryson Corona   Number of children: 0   Years of education: Not on file   Highest education level: Not on file  Occupational History   Not on file  Tobacco Use   Smoking status: Never   Smokeless tobacco: Never  Substance and Sexual Activity   Alcohol use: Yes    Alcohol/week: 1.0 standard drink    Types: 1 Glasses of wine per week    Comment: bimonthly   Drug use: No   Sexual activity: Yes  Other Topics Concern   Not on file  Social History Narrative   Bryson Corona - POA  Social Determinants of Health   Financial Resource Strain: Low Risk    Difficulty of Paying Living Expenses: Not hard at all  Food Insecurity: No Food Insecurity   Worried About Programme researcher, broadcasting/film/video in the Last Year: Never true   Ran Out of Food in the Last Year: Never true  Transportation Needs: No Transportation Needs   Lack of Transportation (Medical): No   Lack of Transportation (Non-Medical): No  Physical Activity: Sufficiently Active   Days of Exercise per Week: 5 days   Minutes of Exercise per Session: 30 min  Stress: No Stress Concern Present   Feeling of Stress : Not at all  Social Connections: Unknown   Frequency of Communication with Friends and Family: Not on file   Frequency of Social Gatherings with  Friends and Family: Not on file   Attends Religious Services: Not on file   Active Member of Clubs or Organizations: Not on file   Attends Banker Meetings: Not on file   Marital Status: Married    Review of Systems  Constitutional:  Negative for appetite change and unexpected weight change.  HENT:  Negative for congestion and sinus pressure.   Respiratory:  Negative for cough, chest tightness and shortness of breath.   Cardiovascular:  Negative for chest pain, palpitations and leg swelling.  Gastrointestinal:  Negative for abdominal pain, diarrhea, nausea and vomiting.  Genitourinary:  Negative for difficulty urinating and dysuria.  Musculoskeletal:  Negative for joint swelling and myalgias.  Skin:  Negative for color change and rash.  Neurological:  Negative for dizziness, light-headedness and headaches.  Psychiatric/Behavioral:  Negative for agitation and dysphoric mood.       Objective:    Physical Exam Vitals reviewed.  Constitutional:      General: She is not in acute distress.    Appearance: Normal appearance.  HENT:     Head: Normocephalic and atraumatic.     Right Ear: External ear normal.     Left Ear: External ear normal.  Eyes:     General: No scleral icterus.       Right eye: No discharge.        Left eye: No discharge.     Conjunctiva/sclera: Conjunctivae normal.  Neck:     Thyroid: No thyromegaly.  Cardiovascular:     Rate and Rhythm: Normal rate and regular rhythm.  Pulmonary:     Effort: No respiratory distress.     Breath sounds: Normal breath sounds. No wheezing.  Abdominal:     General: Bowel sounds are normal.     Palpations: Abdomen is soft.     Tenderness: There is no abdominal tenderness.  Musculoskeletal:        General: No swelling or tenderness.     Cervical back: Neck supple. No tenderness.  Lymphadenopathy:     Cervical: No cervical adenopathy.  Skin:    Findings: No erythema or rash.  Neurological:     Mental Status:  She is alert.  Psychiatric:        Mood and Affect: Mood normal.        Behavior: Behavior normal.    BP 128/80   Pulse 71   Ht 5' 0.98" (1.549 m)   Wt 140 lb (63.5 kg)   LMP 02/26/2002   SpO2 97%   BMI 26.47 kg/m  Wt Readings from Last 3 Encounters:  11/24/20 140 lb (63.5 kg)  10/31/20 142 lb (64.4 kg)  08/21/20 142 lb (64.4 kg)  Outpatient Encounter Medications as of 11/24/2020  Medication Sig   ALPRAZolam (XANAX) 0.25 MG tablet Take 1 tablet (0.25 mg total) by mouth at bedtime as needed.   Apremilast (OTEZLA) 30 MG TABS Take 30 mg by mouth daily.   B Complex-C (B-COMPLEX WITH VITAMIN C) tablet Take 1 tablet by mouth daily.   chlorpheniramine-HYDROcodone (TUSSIONEX PENNKINETIC ER) 10-8 MG/5ML SUER Take 5 mLs by mouth every 12 (twelve) hours as needed for cough.   cholecalciferol (VITAMIN D3) 25 MCG (1000 UNIT) tablet Take 1,000 Units by mouth daily.   Crisaborole (EUCRISA) 2 % OINT Apply 1 application topically 2 (two) times daily as needed (psoriasis).   desonide (DESOWEN) 0.05 % cream Apply 1 application topically 2 (two) times daily as needed (psoriasis).   estradiol (ESTRACE) 0.1 MG/GM vaginal cream Place 1 Applicatorful vaginally once a week.   estradiol (VIVELLE-DOT) 0.05 MG/24HR patch Place 1 patch (0.05 mg total) onto the skin 2 (two) times a week.   gabapentin (NEURONTIN) 300 MG capsule Take 300 mg by mouth at bedtime.   HYDROcodone-acetaminophen (NORCO/VICODIN) 5-325 MG tablet Take 1-2 tablets by mouth every 4 (four) hours as needed for moderate pain.   RABEprazole (ACIPHEX) 20 MG tablet TAKE ONE TABLET BY MOUTH EVERY DAY   No facility-administered encounter medications on file as of 11/24/2020.     Lab Results  Component Value Date   WBC 4.3 09/06/2020   HGB 12.6 09/06/2020   HCT 37.1 09/06/2020   PLT 282.0 09/06/2020   GLUCOSE 86 09/06/2020   CHOL 181 09/06/2020   TRIG 71.0 09/06/2020   HDL 60.80 09/06/2020   LDLCALC 106 (H) 09/06/2020   ALT 17  09/06/2020   AST 17 09/06/2020   NA 139 09/06/2020   K 4.0 09/06/2020   CL 105 09/06/2020   CREATININE 0.79 09/06/2020   BUN 16 09/06/2020   CO2 27 09/06/2020   TSH 0.39 01/28/2020   HGBA1C 5.7 09/06/2020       Assessment & Plan:   Problem List Items Addressed This Visit     Arthritis of carpometacarpal (CMC) joint of right thumb    Status post CMC arthroplasty.  Still with some soft tissue swelling but this has improved.  Incisions healing well.  Has follow-up planned next week with orthopedics.      GERD (gastroesophageal reflux disease)    Saw Dr Marius Ditch.  No increased acid reflux reported.  Follow.       History of COVID-19    Recent covid.  Treated with paxlovid.  Appears to be doing well.  No residual problems.  Follow.       Hyperglycemia    Low carb diet and exercise.  Follow met b and a1c.        Relevant Orders   Hemoglobin A1c   TSH   Low iron    Hemoglobin checked June 2022 within normal limits.      Psoriatic arthritis ([place])    Has been followed by dermatology.       Relevant Orders   Hepatic function panel   Basic metabolic panel   Stress    Overall appears to be handling things relatively well.      Other Visit Diagnoses     Screening cholesterol level    -  Primary   Relevant Orders   Lipid panel        Einar Pheasant, MD

## 2020-11-26 ENCOUNTER — Encounter: Payer: Self-pay | Admitting: Internal Medicine

## 2020-11-26 DIAGNOSIS — M1811 Unilateral primary osteoarthritis of first carpometacarpal joint, right hand: Secondary | ICD-10-CM | POA: Insufficient documentation

## 2020-11-26 NOTE — Assessment & Plan Note (Signed)
Recent covid.  Treated with paxlovid.  Appears to be doing well.  No residual problems.  Follow.

## 2020-11-26 NOTE — Assessment & Plan Note (Signed)
Low carb diet and exercise.  Follow met b and a1c.   

## 2020-11-26 NOTE — Assessment & Plan Note (Signed)
Status post [facility] arthroplasty.  Still with some soft tissue swelling but this has improved.  Incisions healing well.  Has follow-up planned next week with orthopedics.

## 2020-11-26 NOTE — Assessment & Plan Note (Signed)
Overall appears to be handling things relatively well. 

## 2020-11-26 NOTE — Assessment & Plan Note (Signed)
Hemoglobin checked June 2022 within normal limits.

## 2020-11-26 NOTE — Assessment & Plan Note (Signed)
Saw Dr Marius Ditch.  No increased acid reflux reported.  Follow.

## 2020-11-26 NOTE — Assessment & Plan Note (Signed)
Has been followed by dermatology.  

## 2020-12-07 ENCOUNTER — Encounter: Payer: Self-pay | Admitting: Internal Medicine

## 2020-12-14 ENCOUNTER — Ambulatory Visit: Payer: Medicare PPO | Admitting: Dermatology

## 2020-12-18 ENCOUNTER — Ambulatory Visit: Payer: Medicare PPO | Attending: Orthopedic Surgery | Admitting: Occupational Therapy

## 2020-12-18 DIAGNOSIS — M25631 Stiffness of right wrist, not elsewhere classified: Secondary | ICD-10-CM | POA: Diagnosis not present

## 2020-12-18 DIAGNOSIS — M79641 Pain in right hand: Secondary | ICD-10-CM | POA: Diagnosis not present

## 2020-12-18 DIAGNOSIS — M25641 Stiffness of right hand, not elsewhere classified: Secondary | ICD-10-CM | POA: Insufficient documentation

## 2020-12-18 DIAGNOSIS — R600 Localized edema: Secondary | ICD-10-CM | POA: Insufficient documentation

## 2020-12-18 DIAGNOSIS — M6281 Muscle weakness (generalized): Secondary | ICD-10-CM | POA: Insufficient documentation

## 2020-12-18 NOTE — Therapy (Signed)
[place] [facility] [address], [place], [address] Phone: [phone]   Fax:  [phone]  Occupational Therapy Evaluation  Patient Details  Name: April Santos MRN: [id number] Date of Birth: [birth year] Referring Provider (OT): Rachelle Hora   Encounter Date: 12/18/2020   OT End of Session - 12/18/20 2114     Visit Number 1    Number of Visits 16    Date for OT Re-Evaluation 02/12/21    OT Start Time 1435    OT Stop Time 1525    OT Time Calculation (min) 50 min    Activity Tolerance Patient tolerated treatment well    Behavior During Therapy [facility] for tasks assessed/performed             Past Medical History:  Diagnosis Date   Arthritis    Colon polyps    COVID-19 2022   Disc disorder    Dysphagia    Family history of adverse reaction to anesthesia    mother nausea   GERD (gastroesophageal reflux disease)    History of uterine fibroid    Psoriasis     Past Surgical History:  Procedure Laterality Date   ABDOMINAL HYSTERECTOMY     BREAST SURGERY  1999   biopsy   carpal  2005   Tunnel right hand   CARPOMETACARPAL ([place]) FUSION OF THUMB Right 11/16/2020   Procedure: Right CARPOMETACARPAL [facility]) ARTHROPLASTY;  Surgeon: Hessie Knows, MD;  Location: [facility] ORS;  Service: Orthopedics;  Laterality: Right;   COLONOSCOPY WITH ESOPHAGOGASTRODUODENOSCOPY (EGD)     COLONOSCOPY WITH PROPOFOL N/A 06/10/2016   Procedure: COLONOSCOPY WITH PROPOFOL;  Surgeon: Manya Silvas, MD;  Location: [facility] ENDOSCOPY;  Service: Endoscopy;  Laterality: N/A;   ESOPHAGOGASTRODUODENOSCOPY (EGD) WITH PROPOFOL N/A 06/10/2016   Procedure: ESOPHAGOGASTRODUODENOSCOPY (EGD) WITH PROPOFOL;  Surgeon: Manya Silvas, MD;  Location: [facility] ENDOSCOPY;  Service: Endoscopy;  Laterality: N/A;   FOOT SURGERY  02/07/1003   right foot benign tumor   SACROILIAC JOINT INJECTION  08/17/13 & 04/13/14   Dr. Sharlet Salina   SIGMOIDOSCOPY     UTERINE FIBROID  SURGERY  1997    There were no vitals filed for this visit.   Subjective Assessment - 12/18/20 2105     Subjective  I am now down to take pain medication only night time - but could not wear the hard splint - so I switch to my soft  one- but pain nearly always - 5-6/10    Pertinent History April Santos is a 65 y.o. female who presents today status post right thumb [facility] arthroplasty by Dr. Hessie Knows on 11/16/2020. She is needing less Norco, only taking 1 at bedtime. Pain and swelling is improving but still having some aching at nighttime    Patient Stated Goals Want to be able to use my R hand and thumb to walk my dog, pick them up , take care of them, my house work , and gardening    Currently in Pain? Yes    Pain Score 6     Pain Location Hand    Pain Orientation Right    Pain Descriptors / Indicators Aching    Pain Type Surgical pain    Pain Onset More than a month ago    Pain Frequency Constant               [facility] OT Assessment - 12/18/20 0001       Assessment   Medical Diagnosis R  THumb CMC arthroplasty    Referring Provider (OT) Rachelle Hora    Onset Date/Surgical Date 11/16/20    Hand Dominance Right      Precautions   Required Braces or Orthoses --   Thumb spica     Home  Environment   Lives With Spouse      Prior Function   Vocation Retired    Leisure 2 small dogs, gardening, house work      AROM   Right Wrist Extension 65 Degrees    Right Wrist Flexion 68 Degrees   pain   Right Wrist Radial Deviation 13 Degrees    Right Wrist Ulnar Deviation 22 Degrees    Left Wrist Extension 75 Degrees    Left Wrist Flexion 90 Degrees    Left Wrist Radial Deviation 22 Degrees    Left Wrist Ulnar Deviation 22 Degrees      Right Hand AROM   R Thumb MCP 0-60 45 Degrees    R Thumb IP 0-80 55 Degrees    R Thumb Radial ABduction/ADduction 0-55 42   pain   R Thumb Palmar ABduction/ADduction 0-45 40   pain   R Thumb Opposition to Index --   Opposition to 5th - slight  pull   R Index  MCP 0-90 80 Degrees    R Index PIP 0-100 90 Degrees    R Long  MCP 0-90 80 Degrees    R Long PIP 0-100 90 Degrees   had trigger finger in the past per pt   R Ring  MCP 0-90 80 Degrees    R Ring PIP 0-100 90 Degrees    R Little  MCP 0-90 80 Degrees    R Little PIP 0-100 90 Degrees                      OT Treatments/Exercises (OP) - 12/18/20 0001       RUE Contrast Bath   Time 8 minutes    Comments decrease pain - prior to HEP review              Review HEP with pt - contrast   3  day - soft splint night time -and alternate in hard done during day to decrease pain -when using her hand a lot -to decrease pain  Pain decrease in session after contrast  AROM tendon glides  AROM block thumb IP and MC flexion  Opposition to all digits   AROM wrist RD, UD, flexion , ext  Thumb PA and RA - pain free all of AROM  10 reps all  2 x day  But can do contrast more - and splint wearing to decrease pain       OT Education - 12/18/20 2113     Education Details Findings of eval and HEP    Person(s) Educated Patient    Methods Explanation;Demonstration;Tactile cues;Verbal cues;Handout    Comprehension Verbal cues required;Returned demonstration;Verbalized understanding              OT Short Term Goals - 12/18/20 2119       OT SHORT TERM GOAL #1   Title Pt to be independent in HEP to decrease pain and wean out of splint more than 50% of day with increase AROM    Baseline pain 5-6/10 , splint most all the time - soft one - decrease AROM in all planes for wrist , thumb and digits    Time 3    Period Weeks  Status New    Target Date 01/08/21               OT Long Term Goals - 12/18/20 2120       OT LONG TERM GOAL #1   Title Pt to show increase thumb AROM in PA , RA, opposition to pick up glass, turn doorknob and  do buttons,    Baseline Pain 5-6/ constant- decrease thumb AROM in all planes with pain    Time 3    Period Weeks     Status New    Target Date 01/08/21      OT LONG TERM GOAL #2   Title R thumb strength in all planes pt to walk dogs, do laundry and carry plate    Baseline on strength- AROM and at rest 5-6/10 at thumb    Time 4    Period Weeks    Status New    Target Date 01/15/21      OT LONG TERM GOAL #3   Title R wrist AROM increase to WNL to push up from chair , push and pull door, turn doorknob without increase symptoms    Baseline Decrease wrist AROM in all planes -and pain 5-6/10    Time 6    Period Weeks    Status New    Target Date 01/29/21      OT LONG TERM GOAL #4   Title R grip and prehension strength increase to more than 60% compare to L hand to squeeze washcloth, carry plate, take care of dogs    Baseline NT - 4 1/2 wks s/p - pain at rest - constant in splint 5-6/10    Time 8    Period Weeks    Status New    Target Date 02/12/21                   Plan - 12/18/20 2114     Clinical Impression Statement Pt had R thumb arthroplasty on 11/16/20 - pt present 4 1/2 s/p with report of increase pain constantly 5-6/10 in thumb , switch to soft splint about 5 days ago - but walking her dogs, bathed them and did some house work - ed pt on protocol and HEP - pt with increase pain/edema  and decrease AROM in thumb , digits and wrist - decrease strength limiting her functional use of R dominant hand in ADL's and IADL's - pt to focus next 24-48hrs to decrease pain and edema- more than increase AROM , and splint weaning - pt ed on joint protetion and modifications    OT Occupational Profile and History Problem Focused Assessment - Including review of records relating to presenting problem    Occupational performance deficits (Please refer to evaluation for details): ADL's;IADL's;Play;Leisure;Social Participation;Rest and Sleep    Body Structure / Function / Physical Skills ADL;Strength;Decreased knowledge of use of DME;Dexterity;Pain;Edema;IADL;ROM;Scar mobility;Flexibility;FMC;Decreased  knowledge of precautions    Rehab Potential Good    Clinical Decision Making Limited treatment options, no task modification necessary    Comorbidities Affecting Occupational Performance: None    Modification or Assistance to Complete Evaluation  No modification of tasks or assist necessary to complete eval    OT Frequency 2x / week    OT Duration 8 weeks    OT Treatment/Interventions Self-care/ADL training;Fluidtherapy;DME and/or AE instruction;Splinting;Therapeutic activities;Contrast Bath;Ultrasound;Therapeutic exercise;Manual Therapy;Paraffin;Patient/family education;Passive range of motion    Consulted and Agree with Plan of Care Patient  Patient will benefit from skilled therapeutic intervention in order to improve the following deficits and impairments:   Body Structure / Function / Physical Skills: ADL, Strength, Decreased knowledge of use of DME, Dexterity, Pain, Edema, IADL, ROM, Scar mobility, Flexibility, FMC, Decreased knowledge of precautions       Visit Diagnosis: Pain in right hand - Plan: Ot plan of care cert/re-cert  Localized edema - Plan: Ot plan of care cert/re-cert  Stiffness of right hand, not elsewhere classified - Plan: Ot plan of care cert/re-cert  Stiffness of right wrist, not elsewhere classified - Plan: Ot plan of care cert/re-cert  Muscle weakness (generalized) - Plan: Ot plan of care cert/re-cert    Problem List Patient Active Problem List   Diagnosis Date Noted   Arthritis of carpometacarpal [facility]) joint of right thumb 11/26/2020   History of COVID-19 10/31/2020   Hyperglycemia 08/22/2020   Breast cancer screening 01/30/2020   Vitamin D deficiency 08/02/2018   Generalized osteoarthritis of hand 09/04/2017   Right hand pain 03/11/2016   Psoriatic arthritis ([place]) 02/11/2016   Left shoulder pain 02/05/2016   Low iron 06/05/2015   Skin lesion 06/05/2015   Current use of estrogen therapy 11/29/2014   HNP (herniated nucleus  pulposus), lumbar 10/20/2014   Lumbar radiculitis 10/20/2014   Leg pain, left 05/16/2014   Health care maintenance 05/16/2014   Stress 10/17/2013   Osteoarthritis of knee 09/15/2013   Sacroiliitis ([place]) 08/17/2013   Right knee pain 07/20/2013   Toenail fungus 06/12/2013   Osteopenia 05/14/2013   Carpal tunnel syndrome 11/26/2012   Environmental allergies 05/03/2012   History of condyloma acuminatum 05/03/2012   GERD (gastroesophageal reflux disease) 05/03/2012    Rosalyn Gess, OTR/L, CLT 12/18/2020, 9:28 PM  [place] [facility] [address] [place], [address] Phone: [phone]   Fax:  [phone]  Name: KELLIANNE BOWHAY MRN: [id number] Date of Birth: 01/29/1956

## 2020-12-20 ENCOUNTER — Encounter: Payer: Self-pay | Admitting: Internal Medicine

## 2020-12-20 DIAGNOSIS — Z1231 Encounter for screening mammogram for malignant neoplasm of breast: Secondary | ICD-10-CM | POA: Diagnosis not present

## 2020-12-20 DIAGNOSIS — Z9189 Other specified personal risk factors, not elsewhere classified: Secondary | ICD-10-CM

## 2020-12-20 LAB — HM MAMMOGRAPHY

## 2020-12-22 DIAGNOSIS — M1811 Unilateral primary osteoarthritis of first carpometacarpal joint, right hand: Secondary | ICD-10-CM | POA: Diagnosis not present

## 2020-12-25 ENCOUNTER — Ambulatory Visit: Payer: Medicare PPO | Admitting: Occupational Therapy

## 2020-12-25 ENCOUNTER — Other Ambulatory Visit: Payer: Self-pay

## 2020-12-25 DIAGNOSIS — M6281 Muscle weakness (generalized): Secondary | ICD-10-CM

## 2020-12-25 DIAGNOSIS — M79641 Pain in right hand: Secondary | ICD-10-CM

## 2020-12-25 DIAGNOSIS — R600 Localized edema: Secondary | ICD-10-CM | POA: Diagnosis not present

## 2020-12-25 DIAGNOSIS — M25641 Stiffness of right hand, not elsewhere classified: Secondary | ICD-10-CM

## 2020-12-25 DIAGNOSIS — M25631 Stiffness of right wrist, not elsewhere classified: Secondary | ICD-10-CM

## 2020-12-25 DIAGNOSIS — L409 Psoriasis, unspecified: Secondary | ICD-10-CM

## 2020-12-25 MED ORDER — EUCRISA 2 % EX OINT: 1.0000 "application " | TOPICAL_OINTMENT | Freq: Two times a day (BID) | CUTANEOUS | 1 refills | Status: DC | PRN

## 2020-12-25 NOTE — Therapy (Signed)
[place] [facility] [address] [place], [address] Phone: [phone]   Fax:  [phone]  Occupational Therapy Treatment  Patient Details  Name: April Santos MRN: [id number] Date of Birth: 06-21-1955 Referring Provider (OT): Rachelle Hora   Encounter Date: 12/25/2020   OT End of Session - 12/25/20 1446     Visit Number 2    Number of Visits 16    Date for OT Re-Evaluation 02/12/21    OT Start Time 1347    OT Stop Time 1436    OT Time Calculation (min) 49 min    Activity Tolerance Patient tolerated treatment well    Behavior During Therapy [facility] for tasks assessed/performed             Past Medical History:  Diagnosis Date   Arthritis    Colon polyps    COVID-19 2022   Disc disorder    Dysphagia    Family history of adverse reaction to anesthesia    mother nausea   GERD (gastroesophageal reflux disease)    History of uterine fibroid    Psoriasis     Past Surgical History:  Procedure Laterality Date   ABDOMINAL HYSTERECTOMY     BREAST SURGERY  1999   biopsy   carpal  2005   Tunnel right hand   CARPOMETACARPAL ([place]) FUSION OF THUMB Right 11/16/2020   Procedure: Right CARPOMETACARPAL [facility]) ARTHROPLASTY;  Surgeon: Hessie Knows, MD;  Location: [facility] ORS;  Service: Orthopedics;  Laterality: Right;   COLONOSCOPY WITH ESOPHAGOGASTRODUODENOSCOPY (EGD)     COLONOSCOPY WITH PROPOFOL N/A 06/10/2016   Procedure: COLONOSCOPY WITH PROPOFOL;  Surgeon: Manya Silvas, MD;  Location: [facility] ENDOSCOPY;  Service: Endoscopy;  Laterality: N/A;   ESOPHAGOGASTRODUODENOSCOPY (EGD) WITH PROPOFOL N/A 06/10/2016   Procedure: ESOPHAGOGASTRODUODENOSCOPY (EGD) WITH PROPOFOL;  Surgeon: Manya Silvas, MD;  Location: [facility] ENDOSCOPY;  Service: Endoscopy;  Laterality: N/A;   FOOT SURGERY  02/07/1003   right foot benign tumor   SACROILIAC JOINT INJECTION  08/17/13 & 04/13/14   Dr. Sharlet Salina   SIGMOIDOSCOPY     UTERINE FIBROID  SURGERY  1997    There were no vitals filed for this visit.   Subjective Assessment - 12/25/20 1443     Subjective  DOing better - done what you told me - pain is down to 2-3/10 - and wearing some the hard splint - not walking the dog on that side -did see the surgeon    Pertinent History April Santos is a 65 y.o. female who presents today status post right thumb [facility] arthroplasty by Dr. Hessie Knows on 11/16/2020. She is needing less Norco, only taking 1 at bedtime. Pain and swelling is improving but still having some aching at nighttime    Patient Stated Goals Want to be able to use my R hand and thumb to walk my dog, pick them up , take care of them, my house work , and gardening    Currently in Pain? Yes    Pain Score 1     Pain Location Hand    Pain Orientation Right    Pain Descriptors / Indicators Aching    Pain Type Surgical pain    Pain Onset More than a month ago    Pain Frequency Intermittent                [facility] OT Assessment - 12/25/20 0001       AROM   Right Wrist Extension  70 Degrees    Right Wrist Flexion 80 Degrees    Right Wrist Radial Deviation 25 Degrees    Right Wrist Ulnar Deviation 30 Degrees      Right Hand AROM   R Thumb MCP 0-60 40 Degrees    R Thumb IP 0-80 45 Degrees    R Thumb Radial ABduction/ADduction 0-55 46    R Thumb Palmar ABduction/ADduction 0-45 55    R Thumb Opposition to Index --   opposition to 5th            Measurements taken coming in - progress in AROM in wrist an thumb- decrease pain  See flowsheet Pt in thumb spica - pt fitted with CMC neoprene to use some during day with light activities- Use thumb spica with heavy or hard activities          OT Treatments/Exercises (OP) - 12/25/20 0001       RUE Fluidotherapy   Number Minutes Fluidotherapy 8 Minutes    RUE Fluidotherapy Location Hand;Wrist    Comments AROM for wrist and thumb in all planes - pain free prior to soft tissue               Soft tissue  mobs done to webspace of thumb - scar massage -MC and CT spreads - thumb webspace - prior to AROM   Cont with contrast during day prior to HEP   AROM tendon glides  PROM  thumb IP and MC flexion prior to opposition Opposition to all digits   AROM wrist RD, UD, flexion , ext  Thumb PA and RA - pain free all of AROM  and AAROM  10 reps all  Isometric strengthening for thumb at 45 degrees PA - PA and RA - 12 reps - 2 sets  2 x day           OT Education - 12/25/20 1445     Education Details progress and changes to HEP    Person(s) Educated Patient    Methods Explanation;Demonstration;Tactile cues;Verbal cues;Handout    Comprehension Verbal cues required;Returned demonstration;Verbalized understanding              OT Short Term Goals - 12/18/20 2119       OT SHORT TERM GOAL #1   Title Pt to be independent in HEP to decrease pain and wean out of splint more than 50% of day with increase AROM    Baseline pain 5-6/10 , splint most all the time - soft one - decrease AROM in all planes for wrist , thumb and digits    Time 3    Period Weeks    Status New    Target Date 01/08/21               OT Long Term Goals - 12/18/20 2120       OT LONG TERM GOAL #1   Title Pt to show increase thumb AROM in PA , RA, opposition to pick up glass, turn doorknob and  do buttons,    Baseline Pain 5-6/ constant- decrease thumb AROM in all planes with pain    Time 3    Period Weeks    Status New    Target Date 01/08/21      OT LONG TERM GOAL #2   Title R thumb strength in all planes pt to walk dogs, do laundry and carry plate    Baseline on strength- AROM and at rest 5-6/10 at thumb  Time 4    Period Weeks    Status New    Target Date 01/15/21      OT LONG TERM GOAL #3   Title R wrist AROM increase to WNL to push up from chair , push and pull door, turn doorknob without increase symptoms    Baseline Decrease wrist AROM in all planes -and pain 5-6/10    Time 6    Period Weeks     Status New    Target Date 01/29/21      OT LONG TERM GOAL #4   Title R grip and prehension strength increase to more than 60% compare to L hand to squeeze washcloth, carry plate, take care of dogs    Baseline NT - 4 1/2 wks s/p - pain at rest - constant in splint 5-6/10    Time 8    Period Weeks    Status New    Target Date 02/12/21                   Plan - 12/25/20 1446     Clinical Impression Statement Pt had R thumb arthroplasty on 11/16/20 - pt is  5 1/2 s/p  this date with report of pain decrease to 2-3/10 from  5-6/10 in thumb last wee. Pt wearing thumb spica about 40-50% since seen surgeon last week - pt show increase AROM except thumb flexion - did add this date some PROM for thumb IP and MC flexoin , isometric strengthening for thumb PA and RA, Pt fitted with thumb CMC neoprene splint to wear during day with light activities and gradually increase time out of thumb spica- keeping pain under 1-2/10 at the worse - pt cont to show increase pain, scar tissue, decrease ROM and strength limiting her functional use of R dominant hand in ADL's and IADL's -  pt ed on joint protetion and modifications last visit    OT Occupational Profile and History Problem Focused Assessment - Including review of records relating to presenting problem    Occupational performance deficits (Please refer to evaluation for details): ADL's;IADL's;Play;Leisure;Social Participation;Rest and Sleep    Body Structure / Function / Physical Skills ADL;Strength;Decreased knowledge of use of DME;Dexterity;Pain;Edema;IADL;ROM;Scar mobility;Flexibility;FMC;Decreased knowledge of precautions    Rehab Potential Good    Clinical Decision Making Limited treatment options, no task modification necessary    Comorbidities Affecting Occupational Performance: None    Modification or Assistance to Complete Evaluation  No modification of tasks or assist necessary to complete eval    OT Frequency 2x / week    OT Duration 8  weeks    OT Treatment/Interventions Self-care/ADL training;Fluidtherapy;DME and/or AE instruction;Splinting;Therapeutic activities;Contrast Bath;Ultrasound;Therapeutic exercise;Manual Therapy;Paraffin;Patient/family education;Passive range of motion    Consulted and Agree with Plan of Care Patient             Patient will benefit from skilled therapeutic intervention in order to improve the following deficits and impairments:   Body Structure / Function / Physical Skills: ADL, Strength, Decreased knowledge of use of DME, Dexterity, Pain, Edema, IADL, ROM, Scar mobility, Flexibility, FMC, Decreased knowledge of precautions       Visit Diagnosis: Pain in right hand  Localized edema  Stiffness of right hand, not elsewhere classified  Stiffness of right wrist, not elsewhere classified  Muscle weakness (generalized)    Problem List Patient Active Problem List   Diagnosis Date Noted   Arthritis of carpometacarpal [facility]) joint of right thumb 11/26/2020   History of COVID-19 10/31/2020  Hyperglycemia 08/22/2020   Breast cancer screening 01/30/2020   Vitamin D deficiency 08/02/2018   Generalized osteoarthritis of hand 09/04/2017   Right hand pain 03/11/2016   Psoriatic arthritis ([place]) 02/11/2016   Left shoulder pain 02/05/2016   Low iron 06/05/2015   Skin lesion 06/05/2015   Current use of estrogen therapy 11/29/2014   HNP (herniated nucleus pulposus), lumbar 10/20/2014   Lumbar radiculitis 10/20/2014   Leg pain, left 05/16/2014   Health care maintenance 05/16/2014   Stress 10/17/2013   Osteoarthritis of knee 09/15/2013   Sacroiliitis ([place]) 08/17/2013   Right knee pain 07/20/2013   Toenail fungus 06/12/2013   Osteopenia 05/14/2013   Carpal tunnel syndrome 11/26/2012   Environmental allergies 05/03/2012   History of condyloma acuminatum 05/03/2012   GERD (gastroesophageal reflux disease) 05/03/2012    Rosalyn Gess, OTR/L, CLT 12/25/2020, 2:53 PM  [facility]  [place] [facility] [address], [place], [address] Phone: [phone]   Fax:  [phone]  Name: SIANNA CHIDESTER MRN: [id number] Date of Birth: 1956/03/12

## 2020-12-25 NOTE — Progress Notes (Signed)
Eucrisa sent to [facility]

## 2020-12-27 ENCOUNTER — Ambulatory Visit: Payer: Medicare PPO | Admitting: Occupational Therapy

## 2020-12-27 DIAGNOSIS — R600 Localized edema: Secondary | ICD-10-CM

## 2020-12-27 DIAGNOSIS — M25631 Stiffness of right wrist, not elsewhere classified: Secondary | ICD-10-CM

## 2020-12-27 DIAGNOSIS — M6281 Muscle weakness (generalized): Secondary | ICD-10-CM

## 2020-12-27 DIAGNOSIS — M79641 Pain in right hand: Secondary | ICD-10-CM

## 2020-12-27 DIAGNOSIS — M25641 Stiffness of right hand, not elsewhere classified: Secondary | ICD-10-CM

## 2020-12-27 NOTE — Therapy (Signed)
[place] [facility] [address], [place], [address] Phone: [phone]   Fax:  [phone]  Occupational Therapy Treatment  Patient Details  Name: April Santos MRN: [id number] Date of Birth: 1956/01/25 Referring Provider (OT): Rachelle Hora   Encounter Date: 12/27/2020   OT End of Session - 12/27/20 1420     Visit Number 3    Number of Visits 16    Date for OT Re-Evaluation 02/12/21    OT Start Time 1400    OT Stop Time 2585    OT Time Calculation (min) 47 min    Activity Tolerance Patient tolerated treatment well    Behavior During Therapy [facility] for tasks assessed/performed             Past Medical History:  Diagnosis Date   Arthritis    Colon polyps    COVID-19 2022   Disc disorder    Dysphagia    Family history of adverse reaction to anesthesia    mother nausea   GERD (gastroesophageal reflux disease)    History of uterine fibroid    Psoriasis     Past Surgical History:  Procedure Laterality Date   ABDOMINAL HYSTERECTOMY     BREAST SURGERY  1999   biopsy   carpal  2005   Tunnel right hand   CARPOMETACARPAL ([place]) FUSION OF THUMB Right 11/16/2020   Procedure: Right CARPOMETACARPAL [facility]) ARTHROPLASTY;  Surgeon: Hessie Knows, MD;  Location: [facility] ORS;  Service: Orthopedics;  Laterality: Right;   COLONOSCOPY WITH ESOPHAGOGASTRODUODENOSCOPY (EGD)     COLONOSCOPY WITH PROPOFOL N/A 06/10/2016   Procedure: COLONOSCOPY WITH PROPOFOL;  Surgeon: Manya Silvas, MD;  Location: [facility] ENDOSCOPY;  Service: Endoscopy;  Laterality: N/A;   ESOPHAGOGASTRODUODENOSCOPY (EGD) WITH PROPOFOL N/A 06/10/2016   Procedure: ESOPHAGOGASTRODUODENOSCOPY (EGD) WITH PROPOFOL;  Surgeon: Manya Silvas, MD;  Location: [facility] ENDOSCOPY;  Service: Endoscopy;  Laterality: N/A;   FOOT SURGERY  02/07/1003   right foot benign tumor   SACROILIAC JOINT INJECTION  08/17/13 & 04/13/14   Dr. Sharlet Salina   SIGMOIDOSCOPY     UTERINE FIBROID  SURGERY  1997    There were no vitals filed for this visit.   Subjective Assessment - 12/27/20 1418     Subjective  Maybe done to much -between my exercises and doing some yard work - had some pain - 2-3/10 - but doing okay - I did had my hard splint on    Pertinent History April Santos is a 65 y.o. female who presents today status post right thumb [facility] arthroplasty by Dr. Hessie Knows on 11/16/2020. She is needing less Norco, only taking 1 at bedtime. Pain and swelling is improving but still having some aching at nighttime    Patient Stated Goals Want to be able to use my R hand and thumb to walk my dog, pick them up , take care of them, my house work , and gardening    Currently in Pain? Yes    Pain Score 3     Pain Location Hand    Pain Orientation Right    Pain Descriptors / Indicators Aching    Pain Type Surgical pain    Pain Onset More than a month ago    Pain Frequency Intermittent                [facility] OT Assessment - 12/27/20 0001       AROM   Right Wrist Extension 70 Degrees  Right Wrist Flexion 80 Degrees    Right Wrist Radial Deviation 28 Degrees    Right Wrist Ulnar Deviation 35 Degrees      Right Hand AROM   R Thumb MCP 0-60 50 Degrees    R Thumb IP 0-80 50 Degrees    R Thumb Radial ABduction/ADduction 0-55 49    R Thumb Palmar ABduction/ADduction 0-45 55                      OT Treatments/Exercises (OP) - 12/27/20 0001       RUE Fluidotherapy   Number Minutes Fluidotherapy 8 Minutes    RUE Fluidotherapy Location Hand;Wrist    Comments AROM for wrist and thumb in all planes             Soft tissue mobs done to webspace of thumb and MC spreads - scar massage  done by OT and xtractor with AROM for htumb -MC and CT spreads - thumb webspace - prior to AROM  Cont cica scar pad   Cont with contrast during day prior to HEP   AROM tendon glides  PROM  thumb IP and MC flexion prior to opposition Opposition to all digits   AROM wrist RD,  UD, flexion , ext  Add this date 1 lbs for wrist in all planes- 12 reps 2 x day - increase over the next 5-6 days to 3 sets if pain free  Thumb PA and RA - pain free all of AROM  and AAROM  10 reps all  Attempted rubber band for thumb PA and RA- but pt straining to much  Cont to work on AROM for thumb PA and RA- AAROM and  Isometric strengthening for thumb at 45 degrees PA - PA and RA - 12 reps - 2 sets  2 x day            OT Education - 12/27/20 1419     Education Details progress and changes to HEP    Person(s) Educated Patient    Methods Explanation;Demonstration;Tactile cues;Verbal cues;Handout    Comprehension Verbal cues required;Returned demonstration;Verbalized understanding              OT Short Term Goals - 12/18/20 2119       OT SHORT TERM GOAL #1   Title Pt to be independent in HEP to decrease pain and wean out of splint more than 50% of day with increase AROM    Baseline pain 5-6/10 , splint most all the time - soft one - decrease AROM in all planes for wrist , thumb and digits    Time 3    Period Weeks    Status New    Target Date 01/08/21               OT Long Term Goals - 12/18/20 2120       OT LONG TERM GOAL #1   Title Pt to show increase thumb AROM in PA , RA, opposition to pick up glass, turn doorknob and  do buttons,    Baseline Pain 5-6/ constant- decrease thumb AROM in all planes with pain    Time 3    Period Weeks    Status New    Target Date 01/08/21      OT LONG TERM GOAL #2   Title R thumb strength in all planes pt to walk dogs, do laundry and carry plate    Baseline on strength- AROM and at rest 5-6/10  at thumb    Time 4    Period Weeks    Status New    Target Date 01/15/21      OT LONG TERM GOAL #3   Title R wrist AROM increase to WNL to push up from chair , push and pull door, turn doorknob without increase symptoms    Baseline Decrease wrist AROM in all planes -and pain 5-6/10    Time 6    Period Weeks    Status New     Target Date 01/29/21      OT LONG TERM GOAL #4   Title R grip and prehension strength increase to more than 60% compare to L hand to squeeze washcloth, carry plate, take care of dogs    Baseline NT - 4 1/2 wks s/p - pain at rest - constant in splint 5-6/10    Time 8    Period Weeks    Status New    Target Date 02/12/21                   Plan - 12/27/20 1420     Clinical Impression Statement Pt had R thumb arthroplasty on 11/16/20 - pt is  6 s/p  this date with report of pain decrease to 2-3/10 from  5-6/10 in thumb last week. Pt wearing thumb spica when doing heavier things -did rake and had increase pain -remind pt again she is 6 wks s/p - pt cont to show increase AROM  in wrist and thumb - pt to wear thumb CMC neoprene splint  during day with light activities and gradually increase time out of thumb spica- keeping pain under 1-2/10 at the worse -add resistance to thumb today and 1 lbs weight for wrist -  pt cont to show increase pain, scar tissue, decrease ROM and strength limiting her functional use of R dominant hand in ADL's and IADL's -  pt ed on joint protetion and modifications last visit    OT Occupational Profile and History Problem Focused Assessment - Including review of records relating to presenting problem    Occupational performance deficits (Please refer to evaluation for details): ADL's;IADL's;Play;Leisure;Social Participation;Rest and Sleep    Body Structure / Function / Physical Skills ADL;Strength;Decreased knowledge of use of DME;Dexterity;Pain;Edema;IADL;ROM;Scar mobility;Flexibility;FMC;Decreased knowledge of precautions    Rehab Potential Good    Clinical Decision Making Limited treatment options, no task modification necessary    Comorbidities Affecting Occupational Performance: None    Modification or Assistance to Complete Evaluation  No modification of tasks or assist necessary to complete eval    OT Frequency 2x / week    OT Duration 8 weeks    OT  Treatment/Interventions Self-care/ADL training;Fluidtherapy;DME and/or AE instruction;Splinting;Therapeutic activities;Contrast Bath;Ultrasound;Therapeutic exercise;Manual Therapy;Paraffin;Patient/family education;Passive range of motion    Consulted and Agree with Plan of Care Patient             Patient will benefit from skilled therapeutic intervention in order to improve the following deficits and impairments:   Body Structure / Function / Physical Skills: ADL, Strength, Decreased knowledge of use of DME, Dexterity, Pain, Edema, IADL, ROM, Scar mobility, Flexibility, FMC, Decreased knowledge of precautions       Visit Diagnosis: Pain in right hand  Localized edema  Stiffness of right hand, not elsewhere classified  Stiffness of right wrist, not elsewhere classified  Muscle weakness (generalized)    Problem List Patient Active Problem List   Diagnosis Date Noted   Arthritis of carpometacarpal ([facility]) joint  of right thumb 11/26/2020   History of COVID-19 10/31/2020   Hyperglycemia 08/22/2020   Breast cancer screening 01/30/2020   Vitamin D deficiency 08/02/2018   Generalized osteoarthritis of hand 09/04/2017   Right hand pain 03/11/2016   Psoriatic arthritis ([place]) 02/11/2016   Left shoulder pain 02/05/2016   Low iron 06/05/2015   Skin lesion 06/05/2015   Current use of estrogen therapy 11/29/2014   HNP (herniated nucleus pulposus), lumbar 10/20/2014   Lumbar radiculitis 10/20/2014   Leg pain, left 05/16/2014   Health care maintenance 05/16/2014   Stress 10/17/2013   Osteoarthritis of knee 09/15/2013   Sacroiliitis ([place]) 08/17/2013   Right knee pain 07/20/2013   Toenail fungus 06/12/2013   Osteopenia 05/14/2013   Carpal tunnel syndrome 11/26/2012   Environmental allergies 05/03/2012   History of condyloma acuminatum 05/03/2012   GERD (gastroesophageal reflux disease) 05/03/2012    April Santos, OTR/L,CLT 12/27/2020, 2:58 PM  [place] [facility] [address] [place], [address] Phone: [phone]   Fax:  [phone]  Name: April Santos MRN: [id number] Date of Birth: May 15, 1955

## 2020-12-28 ENCOUNTER — Ambulatory Visit (INDEPENDENT_AMBULATORY_CARE_PROVIDER_SITE_OTHER): Payer: Medicare PPO

## 2020-12-28 ENCOUNTER — Other Ambulatory Visit: Payer: Self-pay

## 2020-12-28 DIAGNOSIS — Z23 Encounter for immunization: Secondary | ICD-10-CM | POA: Diagnosis not present

## 2020-12-29 ENCOUNTER — Ambulatory Visit: Payer: Medicare PPO

## 2021-01-02 ENCOUNTER — Other Ambulatory Visit: Payer: Self-pay

## 2021-01-02 ENCOUNTER — Ambulatory Visit: Payer: Medicare PPO | Admitting: Occupational Therapy

## 2021-01-02 ENCOUNTER — Ambulatory Visit: Payer: Medicare PPO | Admitting: Dermatology

## 2021-01-02 DIAGNOSIS — L409 Psoriasis, unspecified: Secondary | ICD-10-CM | POA: Diagnosis not present

## 2021-01-02 DIAGNOSIS — Z79899 Other long term (current) drug therapy: Secondary | ICD-10-CM

## 2021-01-02 DIAGNOSIS — L988 Other specified disorders of the skin and subcutaneous tissue: Secondary | ICD-10-CM

## 2021-01-02 NOTE — Patient Instructions (Signed)

## 2021-01-02 NOTE — Progress Notes (Signed)
Follow-Up Visit   Subjective  April Santos is a 65 y.o. female who presents for the following: Psoriasis (Bil feet, bil elbows, 74m f/u, Otezla 30mg  1 po qd, Eucrisa/No headaches, no diarrhea, no nausea, no depression) and Facial Elastosis (Face, pt would like to get botox today).  The following portions of the chart were reviewed this encounter and updated as appropriate:   Tobacco  Allergies  Meds  Problems  Med Hx  Surg Hx  Fam Hx     Review of Systems:  No other skin or systemic complaints except as noted in HPI or Assessment and Plan.  Objective  Well appearing patient in no apparent distress; mood and affect are within normal limits.  A focused examination was performed including face, arms, legs. Relevant physical exam findings are noted in the Assessment and Plan.  feet, elbows, knees Small mild plaque L elbow, R knee  face Rhytides and volume loss.       Assessment & Plan   Long term medication management.   Psoriasis feet, elbows, knees  Psoriasis is a chronic non-curable, but treatable genetic/hereditary disease that may have other systemic features affecting other organ systems such as joints (Psoriatic Arthritis). It is associated with an increased risk of inflammatory bowel disease, heart disease, non-alcoholic fatty liver disease, and depression.     Cont Otezla 30mg  1 po qd No side effects with Otezla Cont Eucrisa oint qd  Side effects of Otezla (apremilast) include diarrhea, nausea, headache, upper respiratory infection, depression, and weight decrease (5-10%). It should only be taken by pregnant women after a discussion regarding risks and benefits with their doctor. Goal is control of skin condition, not cure.  The use of [name] requires long term medication management, including periodic office visits.   Related Medications Crisaborole (EUCRISA) 2 % OINT Apply 1 application topically 2 (two) times daily as needed (psoriasis).  Elastosis of  skin face  Botox 52.5 units injected today to: - Frown complex 32.5 units - Brow lift 5 units x 2 - Crow's feet 5 units x 2  Discussed treatment to nasolabials, oral commissure areas Recommend pt start Alastin Restoriative Skin Complex first, then IPL to nasolabial and oral commissure area and then maybe a filler would be an option.  Filling material injection - face Location: frown complex, brow lift, crows feet  Informed consent: Discussed risks (infection, pain, bleeding, bruising, swelling, allergic reaction, paralysis of nearby muscles, eyelid droop, double vision, neck weakness, difficulty breathing, headache, undesirable cosmetic result, and need for additional treatment) and benefits of the procedure, as well as the alternatives.  Informed consent was obtained.  Preparation: The area was cleansed with alcohol.  Procedure Details:  Botox was injected into the dermis with a 30-gauge needle. Pressure applied to any bleeding. Ice packs offered for swelling.  Lot Number:  [id number] Expiration:  07/2022  Total Units Injected:  52.5  Plan: Patient was instructed to remain upright for 4 hours. Patient was instructed to avoid massaging the face and avoid vigorous exercise for the rest of the day. Tylenol may be used for headache.  Allow 2 weeks before returning to clinic for additional dosing as needed. Patient will call for any problems.   Return in 3 weeks (on 01/23/2021) for 3wks Botox f/u, 53m Botox, 37m psoriasis f/u.  I, Othelia Pulling, RMA, am acting as scribe for Sarina Ser, MD . Documentation: I have reviewed the above documentation for accuracy and completeness, and I agree with the above.  Shanon Brow  Nehemiah Massed, MD

## 2021-01-03 DIAGNOSIS — M48062 Spinal stenosis, lumbar region with neurogenic claudication: Secondary | ICD-10-CM | POA: Diagnosis not present

## 2021-01-03 DIAGNOSIS — M5416 Radiculopathy, lumbar region: Secondary | ICD-10-CM | POA: Diagnosis not present

## 2021-01-03 DIAGNOSIS — M5126 Other intervertebral disc displacement, lumbar region: Secondary | ICD-10-CM | POA: Diagnosis not present

## 2021-01-04 ENCOUNTER — Ambulatory Visit: Payer: Medicare PPO | Admitting: Occupational Therapy

## 2021-01-04 ENCOUNTER — Encounter: Payer: Self-pay | Admitting: Dermatology

## 2021-01-04 DIAGNOSIS — M25631 Stiffness of right wrist, not elsewhere classified: Secondary | ICD-10-CM

## 2021-01-04 DIAGNOSIS — M25641 Stiffness of right hand, not elsewhere classified: Secondary | ICD-10-CM | POA: Diagnosis not present

## 2021-01-04 DIAGNOSIS — M79641 Pain in right hand: Secondary | ICD-10-CM

## 2021-01-04 DIAGNOSIS — M6281 Muscle weakness (generalized): Secondary | ICD-10-CM | POA: Diagnosis not present

## 2021-01-04 DIAGNOSIS — R600 Localized edema: Secondary | ICD-10-CM

## 2021-01-04 NOTE — Therapy (Signed)
[place] [facility] [address] [place], [address] Phone: [phone]   Fax:  [phone]  Occupational Therapy Treatment  Patient Details  Name: April Santos MRN: [id number] Date of Birth: May 25, 1955 Referring Provider (OT): Rachelle Hora   Encounter Date: 01/04/2021   OT End of Session - 01/04/21 1950     Visit Number 4    Number of Visits 16    Date for OT Re-Evaluation 02/12/21    OT Start Time 1324    OT Stop Time 1530    OT Time Calculation (min) 45 min    Activity Tolerance Patient tolerated treatment well    Behavior During Therapy [facility] for tasks assessed/performed             Past Medical History:  Diagnosis Date   Arthritis    Colon polyps    COVID-19 2022   Disc disorder    Dysphagia    Family history of adverse reaction to anesthesia    mother nausea   GERD (gastroesophageal reflux disease)    History of uterine fibroid    Psoriasis     Past Surgical History:  Procedure Laterality Date   ABDOMINAL HYSTERECTOMY     BREAST SURGERY  1999   biopsy   carpal  2005   Tunnel right hand   CARPOMETACARPAL ([place]) FUSION OF THUMB Right 11/16/2020   Procedure: Right CARPOMETACARPAL [facility]) ARTHROPLASTY;  Surgeon: Hessie Knows, MD;  Location: [facility] ORS;  Service: Orthopedics;  Laterality: Right;   COLONOSCOPY WITH ESOPHAGOGASTRODUODENOSCOPY (EGD)     COLONOSCOPY WITH PROPOFOL N/A 06/10/2016   Procedure: COLONOSCOPY WITH PROPOFOL;  Surgeon: Manya Silvas, MD;  Location: [facility] ENDOSCOPY;  Service: Endoscopy;  Laterality: N/A;   ESOPHAGOGASTRODUODENOSCOPY (EGD) WITH PROPOFOL N/A 06/10/2016   Procedure: ESOPHAGOGASTRODUODENOSCOPY (EGD) WITH PROPOFOL;  Surgeon: Manya Silvas, MD;  Location: [facility] ENDOSCOPY;  Service: Endoscopy;  Laterality: N/A;   FOOT SURGERY  02/07/1003   right foot benign tumor   SACROILIAC JOINT INJECTION  08/17/13 & 04/13/14   Dr. Sharlet Salina   SIGMOIDOSCOPY     UTERINE FIBROID  SURGERY  1997    There were no vitals filed for this visit.   Subjective Assessment - 01/04/21 1948     Subjective  You would not believe but I was doing better but then  I did washing and the washer door slap down on the tip of my thumb -offcause the R thumb - pain better now about 2 /10 tip    Pertinent History April Santos is a 65 y.o. female who presents today status post right thumb [facility] arthroplasty by Dr. Hessie Knows on 11/16/2020. She is needing less Norco, only taking 1 at bedtime. Pain and swelling is improving but still having some aching at nighttime    Patient Stated Goals Want to be able to use my R hand and thumb to walk my dog, pick them up , take care of them, my house work , and gardening    Currently in Pain? Yes    Pain Score 2     Pain Location --   IP of thumb   Pain Orientation Right    Pain Descriptors / Indicators Aching;Tender    Pain Type Acute pain;Surgical pain    Pain Onset In the past 7 days    Pain Frequency Constant                [facility] OT Assessment - 01/04/21 0001  AROM   Right Wrist Extension 70 Degrees    Right Wrist Flexion 80 Degrees      Strength   Right Hand Grip (lbs) 34    Right Hand Lateral Pinch 5 lbs    Right Hand 3 Point Pinch 4 lbs    Left Hand Grip (lbs) 58    Left Hand Lateral Pinch 16 lbs    Left Hand 3 Point Pinch 15 lbs                Soft tissue mobs done to webspace of thumb and MC spreads prior to AROM  Cont cica scar pad to cont   Cont with contrast during day prior to HEP if needed - edema is decreasing   AROM tendon glides  PROM  thumb IP and MC flexion prior to opposition Opposition to all digits   AROM wrist RD, UD, flexion , ext - great progress  Upgrade to 2 lbs for wrist in all planes- 12 reps 2 x day - increase over the next 5-6 days to 2-  3 sets if pain free REINFORCE NOT TO OVER DO OR FORCE - OR ADVANCE TO FAST   Thumb PA and RA - pain free all of AROM  and AAROM  10 reps all  rubber  band for thumb PA and RA- able to tolerate this date and to do place and hold for PA 10 reps - increase 2nd and 3rd set pain free -over the next week Cont to work on AROM for thumb PA and RA- AAROM and  Light blue putty provided for gripping - pain free -hold off on lat and 3 point - tender at IP of thumb  Gripping 12 reps - 2 x day and increase over the next week 2nd and 3rd set              OT Education - 01/04/21 1950     Education Details progress and changes to HEP    Person(s) Educated Patient    Methods Explanation;Demonstration;Tactile cues;Verbal cues;Handout    Comprehension Verbal cues required;Returned demonstration;Verbalized understanding              OT Short Term Goals - 12/18/20 2119       OT SHORT TERM GOAL #1   Title Pt to be independent in HEP to decrease pain and wean out of splint more than 50% of day with increase AROM    Baseline pain 5-6/10 , splint most all the time - soft one - decrease AROM in all planes for wrist , thumb and digits    Time 3    Period Weeks    Status New    Target Date 01/08/21               OT Long Term Goals - 12/18/20 2120       OT LONG TERM GOAL #1   Title Pt to show increase thumb AROM in PA , RA, opposition to pick up glass, turn doorknob and  do buttons,    Baseline Pain 5-6/ constant- decrease thumb AROM in all planes with pain    Time 3    Period Weeks    Status New    Target Date 01/08/21      OT LONG TERM GOAL #2   Title R thumb strength in all planes pt to walk dogs, do laundry and carry plate    Baseline on strength- AROM and at rest 5-6/10 at thumb  Time 4    Period Weeks    Status New    Target Date 01/15/21      OT LONG TERM GOAL #3   Title R wrist AROM increase to WNL to push up from chair , push and pull door, turn doorknob without increase symptoms    Baseline Decrease wrist AROM in all planes -and pain 5-6/10    Time 6    Period Weeks    Status New    Target Date 01/29/21       OT LONG TERM GOAL #4   Title R grip and prehension strength increase to more than 60% compare to L hand to squeeze washcloth, carry plate, take care of dogs    Baseline NT - 4 1/2 wks s/p - pain at rest - constant in splint 5-6/10    Time 8    Period Weeks    Status New    Target Date 02/12/21                   Plan - 01/04/21 1950     Clinical Impression Statement Pt had R thumb arthroplasty on 11/16/20 - pt is  7 s/p  this date with report of pain decrease to 2/10 from  5-6/10 in thumb the first 2 sessions . Pt wearing thumb spica when doing heavier things -did rake and had increase pain last time  - this time she report 2 days ago washer door fell on the tips of thumb - was painfull but she did ice and it is better- now 2/10 - ROM for thumb CMC , wrist and grip strength not painfull - but pinches are tender over the DIP of thumb - had long discussion with pt about reason she had surgery , how maybe weeks and now doing painfree strenghtening and gradually increase reps and sets - pt was doing to many and causing pain  Pt show great wrist and thumb AROM - pt to wear thumb CMC neoprene splint  during day with light activities and gradually increase time out of thumb spica/ and neoprene - keeping pain under 1-2/10 at the worse -add resistance to thumb today and  upgrade to 2 lbs weight for wrist -  pt cont to show increase pain, scar tissue, decrease ROM and strength limiting her functional use of R dominant hand in ADL's and IADL's -  pt ed on joint protetion and modifications last visit    OT Occupational Profile and History Problem Focused Assessment - Including review of records relating to presenting problem    Occupational performance deficits (Please refer to evaluation for details): ADL's;IADL's;Play;Leisure;Social Participation;Rest and Sleep    Body Structure / Function / Physical Skills ADL;Strength;Decreased knowledge of use of DME;Dexterity;Pain;Edema;IADL;ROM;Scar  mobility;Flexibility;FMC;Decreased knowledge of precautions    Rehab Potential Good    Clinical Decision Making Limited treatment options, no task modification necessary    Comorbidities Affecting Occupational Performance: None    Modification or Assistance to Complete Evaluation  No modification of tasks or assist necessary to complete eval    OT Frequency 1x / week    OT Duration 8 weeks    OT Treatment/Interventions Self-care/ADL training;Fluidtherapy;DME and/or AE instruction;Splinting;Therapeutic activities;Contrast Bath;Ultrasound;Therapeutic exercise;Manual Therapy;Paraffin;Patient/family education;Passive range of motion    Consulted and Agree with Plan of Care Patient             Patient will benefit from skilled therapeutic intervention in order to improve the following deficits and impairments:   Body Structure /  Function / Physical Skills: ADL, Strength, Decreased knowledge of use of DME, Dexterity, Pain, Edema, IADL, ROM, Scar mobility, Flexibility, FMC, Decreased knowledge of precautions       Visit Diagnosis: Pain in right hand  Localized edema  Stiffness of right hand, not elsewhere classified  Stiffness of right wrist, not elsewhere classified  Muscle weakness (generalized)    Problem List Patient Active Problem List   Diagnosis Date Noted   Arthritis of carpometacarpal [facility]) joint of right thumb 11/26/2020   History of COVID-19 10/31/2020   Hyperglycemia 08/22/2020   Breast cancer screening 01/30/2020   Vitamin D deficiency 08/02/2018   Generalized osteoarthritis of hand 09/04/2017   Right hand pain 03/11/2016   Psoriatic arthritis ([place]) 02/11/2016   Left shoulder pain 02/05/2016   Low iron 06/05/2015   Skin lesion 06/05/2015   Current use of estrogen therapy 11/29/2014   HNP (herniated nucleus pulposus), lumbar 10/20/2014   Lumbar radiculitis 10/20/2014   Leg pain, left 05/16/2014   Health care maintenance 05/16/2014   Stress 10/17/2013    Osteoarthritis of knee 09/15/2013   Sacroiliitis ([place]) 08/17/2013   Right knee pain 07/20/2013   Toenail fungus 06/12/2013   Osteopenia 05/14/2013   Carpal tunnel syndrome 11/26/2012   Environmental allergies 05/03/2012   History of condyloma acuminatum 05/03/2012   GERD (gastroesophageal reflux disease) 05/03/2012    Rosalyn Gess, OTR/L,CLT 01/04/2021, 8:03 PM  [place] [facility] [address], [place], [address] Phone: [phone]   Fax:  [phone]  Name: April Santos MRN: [id number] Date of Birth: Dec 31, 1955

## 2021-01-05 ENCOUNTER — Encounter: Payer: Self-pay | Admitting: Internal Medicine

## 2021-01-08 DIAGNOSIS — R928 Other abnormal and inconclusive findings on diagnostic imaging of breast: Secondary | ICD-10-CM | POA: Diagnosis not present

## 2021-01-08 DIAGNOSIS — R922 Inconclusive mammogram: Secondary | ICD-10-CM | POA: Diagnosis not present

## 2021-01-10 ENCOUNTER — Telehealth: Payer: Self-pay

## 2021-01-10 NOTE — Telephone Encounter (Signed)
F/u views for mammogram received from [facility]- placed out for your review

## 2021-01-10 NOTE — Telephone Encounter (Signed)
Notify - I reviewed mammogram and f/u right breast mammogram and ultrasound results (from [facility]).  Radiology described changes c/w simple cyst.  Recommended f/u mammogram in one year.  Please confirm she is doing ok and if comfortable with this plan.

## 2021-01-11 NOTE — Telephone Encounter (Signed)
LMTCB

## 2021-01-12 ENCOUNTER — Ambulatory Visit: Payer: Medicare PPO | Admitting: Occupational Therapy

## 2021-01-12 ENCOUNTER — Encounter: Payer: Self-pay | Admitting: Internal Medicine

## 2021-01-12 NOTE — Telephone Encounter (Signed)
See mychart.  

## 2021-01-12 NOTE — Telephone Encounter (Signed)
LMTCB

## 2021-01-12 NOTE — Telephone Encounter (Signed)
Patient is returning your call from earlier.Please call her at [phone].

## 2021-01-15 ENCOUNTER — Telehealth: Payer: Self-pay | Admitting: Internal Medicine

## 2021-01-15 DIAGNOSIS — E2839 Other primary ovarian failure: Secondary | ICD-10-CM

## 2021-01-15 NOTE — Telephone Encounter (Signed)
Faxed to [place] [facility].

## 2021-01-15 NOTE — Telephone Encounter (Signed)
April Santos is calling to request an order for a bone density scan for the patient.Please fax the order to [phone].

## 2021-01-17 ENCOUNTER — Ambulatory Visit: Payer: Medicare PPO | Attending: Orthopedic Surgery | Admitting: Occupational Therapy

## 2021-01-17 ENCOUNTER — Encounter: Payer: Self-pay | Admitting: Internal Medicine

## 2021-01-17 ENCOUNTER — Encounter: Payer: Self-pay | Admitting: Occupational Therapy

## 2021-01-17 DIAGNOSIS — M25641 Stiffness of right hand, not elsewhere classified: Secondary | ICD-10-CM

## 2021-01-17 DIAGNOSIS — M79641 Pain in right hand: Secondary | ICD-10-CM

## 2021-01-17 DIAGNOSIS — M25631 Stiffness of right wrist, not elsewhere classified: Secondary | ICD-10-CM

## 2021-01-17 DIAGNOSIS — R600 Localized edema: Secondary | ICD-10-CM | POA: Diagnosis not present

## 2021-01-17 DIAGNOSIS — M6281 Muscle weakness (generalized): Secondary | ICD-10-CM

## 2021-01-18 NOTE — Therapy (Signed)
[place] [facility] [address] [place], [address] Phone: [phone]   Fax:  [phone]  Occupational Therapy Treatment  Patient Details  Name: April Santos MRN: [id number] Date of Birth: [birth year] Referring Provider (OT): Rachelle Hora   Encounter Date: 01/17/2021   OT End of Session - 01/17/21 1534     Visit Number 5    Number of Visits 16    Date for OT Re-Evaluation 02/12/21    OT Start Time 5027    OT Stop Time 7412    OT Time Calculation (min) 43 min    Activity Tolerance Patient tolerated treatment well    Behavior During Therapy [facility] for tasks assessed/performed             Past Medical History:  Diagnosis Date   Arthritis    Colon polyps    COVID-19 2022   Disc disorder    Dysphagia    Family history of adverse reaction to anesthesia    mother nausea   GERD (gastroesophageal reflux disease)    History of uterine fibroid    Psoriasis     Past Surgical History:  Procedure Laterality Date   ABDOMINAL HYSTERECTOMY     BREAST SURGERY  1999   biopsy   carpal  2005   Tunnel right hand   CARPOMETACARPAL ([place]) FUSION OF THUMB Right 11/16/2020   Procedure: Right CARPOMETACARPAL [facility]) ARTHROPLASTY;  Surgeon: Hessie Knows, MD;  Location: [facility] ORS;  Service: Orthopedics;  Laterality: Right;   COLONOSCOPY WITH ESOPHAGOGASTRODUODENOSCOPY (EGD)     COLONOSCOPY WITH PROPOFOL N/A 06/10/2016   Procedure: COLONOSCOPY WITH PROPOFOL;  Surgeon: Manya Silvas, MD;  Location: [facility] ENDOSCOPY;  Service: Endoscopy;  Laterality: N/A;   ESOPHAGOGASTRODUODENOSCOPY (EGD) WITH PROPOFOL N/A 06/10/2016   Procedure: ESOPHAGOGASTRODUODENOSCOPY (EGD) WITH PROPOFOL;  Surgeon: Manya Silvas, MD;  Location: [facility] ENDOSCOPY;  Service: Endoscopy;  Laterality: N/A;   FOOT SURGERY  02/07/1003   right foot benign tumor   SACROILIAC JOINT INJECTION  08/17/13 & 04/13/14   Dr. Sharlet Salina   SIGMOIDOSCOPY     UTERINE FIBROID  SURGERY  1997    There were no vitals filed for this visit.   Subjective Assessment - 01/17/21 1533     Subjective  Pt reports she is doing well today.  States she may need to go over the exercises again, she may not have performed a couple correctly.    Pertinent History April Santos is a 65 y.o. female who presents today status post right thumb [facility] arthroplasty by Dr. Hessie Knows on 11/16/2020. She is needing less Norco, only taking 1 at bedtime. Pain and swelling is improving but still having some aching at nighttime    Patient Stated Goals Want to be able to use my R hand and thumb to walk my dog, pick them up , take care of them, my house work , and gardening    Currently in Pain? No/denies    Pain Score 0-No pain    Pain Orientation Right    Pain Descriptors / Indicators Aching;Tender    Pain Type Acute pain;Surgical pain    Pain Onset In the past 7 days                [facility] OT Assessment - 01/18/21 2052       Assessment   Medical Diagnosis R THumb CMC arthroplasty    Referring Provider (OT) Rachelle Hora    Onset Date/Surgical Date 11/16/20  Hand Dominance Right      Strength   Right Hand Grip (lbs) 40    Right Hand Lateral Pinch 8 lbs    Right Hand 3 Point Pinch 8 lbs    Left Hand Grip (lbs) 58    Left Hand Lateral Pinch 16 lbs    Left Hand 3 Point Pinch 15 lbs            Contrast to right hand and wrist for 11 mins to decrease edema, increase motion and decrease pain performed prior to manual therapy and therapeutic exercise.  Soft tissue mobs performed to webspace of thumb and MC spreads prior to AROM   AROM tendon glides with therapist cues and demonstration  PROM  thumb IP and MC flexion prior to opposition Opposition to all digits   AROM wrist RD, UD, flexion 2 lbs for wrist in all planes- 12 reps 2 x day - increase over the next 3 days to 3 sets    Thumb PA and RA - pain free all of AROM  and AAROM  10 reps all  rubber band resistive exercises  for thumb PA and RA 10 reps currently performing 2 sets, advance to 3 sets over next 3 days.  Cont to work on AROM for thumb PA and RA- AAROM and  Light blue putty provided for gripping not quite ready to upgrade to teal yet, work towards 3 sets this week of 10 reps.   No pain in right thumb IP joint this date. Added lateral and 3 point pinch back into exercises for 1 set of 10 reps each.     Reassessment of hand function, see flowsheet above.  Pt right grip increased from 34# to 40#.  Pinch increased from 4# to 8#.      Additional instruction on HEP especially with UD/RD which she was performing in correctly.   Cont cica scar pad, new pad issued this date.       OT Education - 01/18/21 2046     Education Details progress and changes to HEP    Person(s) Educated Patient    Methods Explanation;Demonstration;Tactile cues;Verbal cues;Handout    Comprehension Verbal cues required;Returned demonstration;Verbalized understanding              OT Short Term Goals - 12/18/20 2119       OT SHORT TERM GOAL #1   Title Pt to be independent in HEP to decrease pain and wean out of splint more than 50% of day with increase AROM    Baseline pain 5-6/10 , splint most all the time - soft one - decrease AROM in all planes for wrist , thumb and digits    Time 3    Period Weeks    Status New    Target Date 01/08/21               OT Long Term Goals - 12/18/20 2120       OT LONG TERM GOAL #1   Title Pt to show increase thumb AROM in PA , RA, opposition to pick up glass, turn doorknob and  do buttons,    Baseline Pain 5-6/ constant- decrease thumb AROM in all planes with pain    Time 3    Period Weeks    Status New    Target Date 01/08/21      OT LONG TERM GOAL #2   Title R thumb strength in all planes pt to walk dogs, do laundry and carry plate  Baseline on strength- AROM and at rest 5-6/10 at thumb    Time 4    Period Weeks    Status New    Target Date 01/15/21      OT  LONG TERM GOAL #3   Title R wrist AROM increase to WNL to push up from chair , push and pull door, turn doorknob without increase symptoms    Baseline Decrease wrist AROM in all planes -and pain 5-6/10    Time 6    Period Weeks    Status New    Target Date 01/29/21      OT LONG TERM GOAL #4   Title R grip and prehension strength increase to more than 60% compare to L hand to squeeze washcloth, carry plate, take care of dogs    Baseline NT - 4 1/2 wks s/p - pain at rest - constant in splint 5-6/10    Time 8    Period Weeks    Status New    Target Date 02/12/21                   Plan - 01/18/21 2046     Clinical Impression Statement Pt had R thumb arthroplasty on 11/16/20 - pt is 9 weeks  s/p  this date with report of pain decrease to 2/10 from  5-6/10 in thumb the first 2 sessions.  Pt reports pain from closing her thumb has disappated. Pt performing well with 2# weight for exercises and has been performing 2 sets, will plan to progress to 3 sets in the next few days.  Pt was performing RD/UD incorrectly however responded well to cues and therapist demonstration.  Pt did improve with grip and pinch strength as noted in flowsheet.  Pt continues to benefit from skilled OT services to maximize safety and independence in necessary daily tasks.    OT Occupational Profile and History Problem Focused Assessment - Including review of records relating to presenting problem    Occupational performance deficits (Please refer to evaluation for details): ADL's;IADL's;Play;Leisure;Social Participation;Rest and Sleep    Body Structure / Function / Physical Skills ADL;Strength;Decreased knowledge of use of DME;Dexterity;Pain;Edema;IADL;ROM;Scar mobility;Flexibility;FMC;Decreased knowledge of precautions    Rehab Potential Good    Clinical Decision Making Limited treatment options, no task modification necessary    Comorbidities Affecting Occupational Performance: None    Modification or Assistance  to Complete Evaluation  No modification of tasks or assist necessary to complete eval    OT Frequency 1x / week    OT Duration 8 weeks    OT Treatment/Interventions Self-care/ADL training;Fluidtherapy;DME and/or AE instruction;Splinting;Therapeutic activities;Contrast Bath;Ultrasound;Therapeutic exercise;Manual Therapy;Paraffin;Patient/family education;Passive range of motion    Consulted and Agree with Plan of Care Patient             Patient will benefit from skilled therapeutic intervention in order to improve the following deficits and impairments:   Body Structure / Function / Physical Skills: ADL, Strength, Decreased knowledge of use of DME, Dexterity, Pain, Edema, IADL, ROM, Scar mobility, Flexibility, FMC, Decreased knowledge of precautions       Visit Diagnosis: Pain in right hand  Localized edema  Stiffness of right hand, not elsewhere classified  Stiffness of right wrist, not elsewhere classified  Muscle weakness (generalized)    Problem List Patient Active Problem List   Diagnosis Date Noted   Arthritis of carpometacarpal [facility]) joint of right thumb 11/26/2020   History of COVID-19 10/31/2020   Hyperglycemia 08/22/2020   Breast cancer screening  01/30/2020   Vitamin D deficiency 08/02/2018   Generalized osteoarthritis of hand 09/04/2017   Right hand pain 03/11/2016   Psoriatic arthritis ([place]) 02/11/2016   Left shoulder pain 02/05/2016   Low iron 06/05/2015   Skin lesion 06/05/2015   Current use of estrogen therapy 11/29/2014   HNP (herniated nucleus pulposus), lumbar 10/20/2014   Lumbar radiculitis 10/20/2014   Leg pain, left 05/16/2014   Health care maintenance 05/16/2014   Stress 10/17/2013   Osteoarthritis of knee 09/15/2013   Sacroiliitis ([place]) 08/17/2013   Right knee pain 07/20/2013   Toenail fungus 06/12/2013   Osteopenia 05/14/2013   Carpal tunnel syndrome 11/26/2012   Environmental allergies 05/03/2012   History of condyloma acuminatum  05/03/2012   GERD (gastroesophageal reflux disease) 05/03/2012   Desree Leap T Adamae Ricklefs, OTR/L, CLT  Cornelious Bartolucci, OT/L 01/18/2021, 9:05 PM  [place] [facility] [address], [place], [address] Phone: [phone]   Fax:  [phone]  Name: April Santos MRN: [id number] Date of Birth: 1955/10/22

## 2021-01-23 DIAGNOSIS — M5126 Other intervertebral disc displacement, lumbar region: Secondary | ICD-10-CM | POA: Diagnosis not present

## 2021-01-23 DIAGNOSIS — M5416 Radiculopathy, lumbar region: Secondary | ICD-10-CM | POA: Diagnosis not present

## 2021-01-23 DIAGNOSIS — M48062 Spinal stenosis, lumbar region with neurogenic claudication: Secondary | ICD-10-CM | POA: Diagnosis not present

## 2021-01-24 ENCOUNTER — Encounter: Payer: Self-pay | Admitting: Dermatology

## 2021-01-24 ENCOUNTER — Ambulatory Visit: Payer: Self-pay | Admitting: Dermatology

## 2021-01-24 ENCOUNTER — Other Ambulatory Visit: Payer: Self-pay

## 2021-01-24 DIAGNOSIS — L988 Other specified disorders of the skin and subcutaneous tissue: Secondary | ICD-10-CM

## 2021-01-24 NOTE — Progress Notes (Signed)
   Follow-Up Visit   Subjective  April Santos is a 65 y.o. female who presents for the following: Facial Elastosis (Face, 3 week f/u to recheck Botox).  The following portions of the chart were reviewed this encounter and updated as appropriate:   Tobacco  Allergies  Meds  Problems  Med Hx  Surg Hx  Fam Hx     Review of Systems:  No other skin or systemic complaints except as noted in HPI or Assessment and Plan.  Objective  Well appearing patient in no apparent distress; mood and affect are within normal limits.  A focused examination was performed including face. Relevant physical exam findings are noted in the Assessment and Plan.  Head - Anterior (Face) Rhytides and volume loss.                   Assessment & Plan  Elastosis of skin Head - Anterior (Face)  Pt pleased with botox results.  Discussed that regular botox q 3-4 months may improve some of the etching in frown complex  Discussed adding 2.5 units to bil crow's feet Discussed adding 1 unit to R sup forehead  Botox 5 units injected today to: - Crow's feet 2.5 units x 2    Filling material injection - Head - Anterior (Face) Location: bil crow's feet  Informed consent: Discussed risks (infection, pain, bleeding, bruising, swelling, allergic reaction, paralysis of nearby muscles, eyelid droop, double vision, neck weakness, difficulty breathing, headache, undesirable cosmetic result, and need for additional treatment) and benefits of the procedure, as well as the alternatives.  Informed consent was obtained.  Preparation: The area was cleansed with alcohol.  Procedure Details:  Botox was injected into the dermis with a 30-gauge needle. Pressure applied to any bleeding. Ice packs offered for swelling.  Lot Number:  [id number] Expiration:  10/2022  Total Units Injected:  5  Plan: Patient was instructed to remain upright for 4 hours. Patient was instructed to avoid massaging the face and avoid  vigorous exercise for the rest of the day. Tylenol may be used for headache.  Allow 2 weeks before returning to clinic for additional dosing as needed. Patient will call for any problems.   Return for as scheduled for Botox.  I, Othelia Pulling, RMA, am acting as scribe for Sarina Ser, MD . Documentation: I have reviewed the above documentation for accuracy and completeness, and I agree with the above.  Sarina Ser, MD

## 2021-01-24 NOTE — Patient Instructions (Signed)

## 2021-01-30 ENCOUNTER — Ambulatory Visit: Payer: Medicare PPO | Admitting: Occupational Therapy

## 2021-01-30 DIAGNOSIS — M25641 Stiffness of right hand, not elsewhere classified: Secondary | ICD-10-CM | POA: Diagnosis not present

## 2021-01-30 DIAGNOSIS — R600 Localized edema: Secondary | ICD-10-CM | POA: Diagnosis not present

## 2021-01-30 DIAGNOSIS — M79641 Pain in right hand: Secondary | ICD-10-CM

## 2021-01-30 DIAGNOSIS — M25631 Stiffness of right wrist, not elsewhere classified: Secondary | ICD-10-CM | POA: Diagnosis not present

## 2021-01-30 DIAGNOSIS — M6281 Muscle weakness (generalized): Secondary | ICD-10-CM | POA: Diagnosis not present

## 2021-01-30 NOTE — Therapy (Signed)
[place] [facility] [address], [place], [address] Phone: [phone]   Fax:  [phone]  Occupational Therapy Treatment  Patient Details  Name: April Santos MRN: [id number] Date of Birth: 11/09/1955 Referring Provider (OT): Rachelle Hora   Encounter Date: 01/30/2021   OT End of Session - 01/30/21 1445     Visit Number 6    Number of Visits 16    Date for OT Re-Evaluation 02/12/21    OT Start Time 1400    OT Stop Time 1443    OT Time Calculation (min) 43 min    Activity Tolerance Patient tolerated treatment well    Behavior During Therapy [facility] for tasks assessed/performed             Past Medical History:  Diagnosis Date   Arthritis    Colon polyps    COVID-19 2022   Disc disorder    Dysphagia    Family history of adverse reaction to anesthesia    mother nausea   GERD (gastroesophageal reflux disease)    History of uterine fibroid    Psoriasis     Past Surgical History:  Procedure Laterality Date   ABDOMINAL HYSTERECTOMY     BREAST SURGERY  1999   biopsy   carpal  2005   Tunnel right hand   CARPOMETACARPAL ([place]) FUSION OF THUMB Right 11/16/2020   Procedure: Right CARPOMETACARPAL [facility]) ARTHROPLASTY;  Surgeon: Hessie Knows, MD;  Location: [facility] ORS;  Service: Orthopedics;  Laterality: Right;   COLONOSCOPY WITH ESOPHAGOGASTRODUODENOSCOPY (EGD)     COLONOSCOPY WITH PROPOFOL N/A 06/10/2016   Procedure: COLONOSCOPY WITH PROPOFOL;  Surgeon: Manya Silvas, MD;  Location: [facility] ENDOSCOPY;  Service: Endoscopy;  Laterality: N/A;   ESOPHAGOGASTRODUODENOSCOPY (EGD) WITH PROPOFOL N/A 06/10/2016   Procedure: ESOPHAGOGASTRODUODENOSCOPY (EGD) WITH PROPOFOL;  Surgeon: Manya Silvas, MD;  Location: [facility] ENDOSCOPY;  Service: Endoscopy;  Laterality: N/A;   FOOT SURGERY  02/07/1003   right foot benign tumor   SACROILIAC JOINT INJECTION  08/17/13 & 04/13/14   Dr. Sharlet Salina   SIGMOIDOSCOPY     UTERINE FIBROID  SURGERY  1997    There were no vitals filed for this visit.   Subjective Assessment - 01/30/21 1443     Subjective  Doing better than when you have seen me - Seen Dr Rudene Christians yesterday - still wearing soft black splint on the R thumb - more than 50% of the time- it is my safety blanket -    Pertinent History April Santos is a 65 y.o. female who presents today status post right thumb [facility] arthroplasty by Dr. Hessie Knows on 11/16/2020. She is needing less Norco, only taking 1 at bedtime. Pain and swelling is improving but still having some aching at nighttime    Patient Stated Goals Want to be able to use my R hand and thumb to walk my dog, pick them up , take care of them, my house work , and gardening    Currently in Pain? No/denies                [facility] OT Assessment - 01/30/21 0001       Strength   Right Hand Grip (lbs) 38    Right Hand Lateral Pinch 10 lbs    Right Hand 3 Point Pinch 7 lbs    Left Hand Grip (lbs) 42   pain ulnar side of hand   Left Hand Lateral Pinch 16 lbs  Provided pt with new cica scar pad to cont with at home for about 2 wks  AROM and Strength in wrist in all planes  WNL - 5/5 strength Thumb PA and RA - pain free all of AROM  and AAROM  10 reps all  Pt to cont with 2-4 wks of rubber band for thumb PA and RA 10 reps - 3 sets pain free Upgrade to med teal putty provided for gripping , lat and 3 point pinch - 1 set of 12-15 pain free - needed mod A for prehension for quality motion for thumb  Increase over the next 10 days to 2nd and 3rd set pain free  And then pt will be out of town for 1-2 wks on vacation - if no issues  Provided with med firm putty to upgrade to for grip and prehension  But need to drop reps and sets to 1 set and 12-15 reps pain free and increase gradually  Also can wean out of splint gradually over the next month -if pain free - 2hrs on and off , then 3 off , 2 on and then 4 off and 2 on  Pt can call when back from  vacation in 3-4 wks for follow up if needed                 OT Education - 01/30/21 1445     Education Details progress and changes to HEP    Person(s) Educated Patient    Methods Explanation;Demonstration;Tactile cues;Verbal cues;Handout    Comprehension Verbal cues required;Returned demonstration;Verbalized understanding              OT Short Term Goals - 01/30/21 1449       OT SHORT TERM GOAL #1   Title Pt to be independent in HEP to decrease pain and wean out of splint more than 50% of day with increase AROM    Status Achieved               OT Long Term Goals - 01/30/21 1449       OT LONG TERM GOAL #1   Title Pt to show increase thumb AROM in PA , RA, opposition to pick up glass, turn doorknob and  do buttons,    Status Achieved      OT LONG TERM GOAL #2   Title R thumb strength in all planes pt to walk dogs, do laundry and carry plate    Baseline on strength- AROM and at rest 5-6/10 at thumb  NOW strength 5-/5 -cont PA and RA -no walking dog yet with R    Time 2    Period Weeks    Status On-going    Target Date 03/14/21      OT LONG TERM GOAL #3   Title R wrist AROM increase to WNL to push up from chair , push and pull door, turn doorknob without increase symptoms    Status Achieved      OT LONG TERM GOAL #4   Title R grip and prehension strength increase to more than 60% compare to L hand to squeeze washcloth, carry plate, take care of dogs    Baseline NT - 4 1/2 wks s/p - pain at rest - constant in splint 5-6/10  NOW increase greatly and more than 50 %    Time 2    Period Weeks    Status On-going    Target Date 02/12/21  Plan - 01/30/21 1445     Clinical Impression Statement Pt had R thumb arthroplasty on 11/16/20 - pt is 10 1/2 weeks  s/p  and report no pain in the R thumb with use but still wearing her soft splint more than 50% of the time- she is using her R hand in most all ADL's with no issues - pt grip and  prehension about the same as last time- upgrade her to med teal putty for all and pt to increase over the next 10 days reps and sets - and then when she is out of town in 2 wks can upgrade to med firm green - but drop her reps and sets again - and keep it pain free. Pt to wean gradually out of her splint 2hrs off and on, then 3 hrs off and 2 on , then 4 hrs off and 2 on - Wrist AROM and strength WNL - can stop HEP -but cont with cica scar pad still for 2 wks - pt to call if need to follow up with me in 3-4 wks    OT Occupational Profile and History Problem Focused Assessment - Including review of records relating to presenting problem    Occupational performance deficits (Please refer to evaluation for details): ADL's;IADL's;Play;Leisure;Social Participation;Rest and Sleep    Body Structure / Function / Physical Skills ADL;Strength;Decreased knowledge of use of DME;Dexterity;Pain;Edema;IADL;ROM;Scar mobility;Flexibility;FMC;Decreased knowledge of precautions    Rehab Potential Good    Clinical Decision Making Limited treatment options, no task modification necessary    Comorbidities Affecting Occupational Performance: None    Modification or Assistance to Complete Evaluation  No modification of tasks or assist necessary to complete eval    OT Frequency Monthly    OT Duration 4 weeks    OT Treatment/Interventions Self-care/ADL training;Fluidtherapy;DME and/or AE instruction;Splinting;Therapeutic activities;Contrast Bath;Ultrasound;Therapeutic exercise;Manual Therapy;Paraffin;Patient/family education;Passive range of motion    Consulted and Agree with Plan of Care Patient             Patient will benefit from skilled therapeutic intervention in order to improve the following deficits and impairments:   Body Structure / Function / Physical Skills: ADL, Strength, Decreased knowledge of use of DME, Dexterity, Pain, Edema, IADL, ROM, Scar mobility, Flexibility, FMC, Decreased knowledge of precautions        Visit Diagnosis: Pain in right hand  Localized edema  Stiffness of right hand, not elsewhere classified  Stiffness of right wrist, not elsewhere classified  Muscle weakness (generalized)    Problem List Patient Active Problem List   Diagnosis Date Noted   Arthritis of carpometacarpal (CMC) joint of right thumb 11/26/2020   History of COVID-19 10/31/2020   Hyperglycemia 08/22/2020   Breast cancer screening 01/30/2020   Vitamin D deficiency 08/02/2018   Generalized osteoarthritis of hand 09/04/2017   Right hand pain 03/11/2016   Psoriatic arthritis ([place]) 02/11/2016   Left shoulder pain 02/05/2016   Low iron 06/05/2015   Skin lesion 06/05/2015   Current use of estrogen therapy 11/29/2014   HNP (herniated nucleus pulposus), lumbar 10/20/2014   Lumbar radiculitis 10/20/2014   Leg pain, left 05/16/2014   Health care maintenance 05/16/2014   Stress 10/17/2013   Osteoarthritis of knee 09/15/2013   Sacroiliitis ([place]) 08/17/2013   Right knee pain 07/20/2013   Toenail fungus 06/12/2013   Osteopenia 05/14/2013   Carpal tunnel syndrome 11/26/2012   Environmental allergies 05/03/2012   History of condyloma acuminatum 05/03/2012   GERD (gastroesophageal reflux disease) 05/03/2012    April Santos,  Gwenette Greet, OTR/L,CLT 01/30/2021, 5:04 PM  [place] [facility] [address], [place], [address] Phone: [phone]   Fax:  [phone]  Name: April Santos MRN: [id number] Date of Birth: 26-Apr-1955

## 2021-02-01 DIAGNOSIS — M859 Disorder of bone density and structure, unspecified: Secondary | ICD-10-CM | POA: Diagnosis not present

## 2021-02-01 DIAGNOSIS — E2839 Other primary ovarian failure: Secondary | ICD-10-CM | POA: Diagnosis not present

## 2021-02-01 DIAGNOSIS — M85852 Other specified disorders of bone density and structure, left thigh: Secondary | ICD-10-CM | POA: Diagnosis not present

## 2021-02-02 LAB — HM DEXA SCAN

## 2021-02-26 ENCOUNTER — Other Ambulatory Visit: Payer: Self-pay

## 2021-02-26 ENCOUNTER — Ambulatory Visit: Payer: Medicare PPO | Admitting: Internal Medicine

## 2021-02-26 ENCOUNTER — Other Ambulatory Visit (INDEPENDENT_AMBULATORY_CARE_PROVIDER_SITE_OTHER): Payer: Medicare PPO

## 2021-02-26 DIAGNOSIS — L4050 Arthropathic psoriasis, unspecified: Secondary | ICD-10-CM | POA: Diagnosis not present

## 2021-02-26 DIAGNOSIS — M5136 Other intervertebral disc degeneration, lumbar region: Secondary | ICD-10-CM | POA: Diagnosis not present

## 2021-02-26 DIAGNOSIS — R739 Hyperglycemia, unspecified: Secondary | ICD-10-CM

## 2021-02-26 DIAGNOSIS — M65332 Trigger finger, left middle finger: Secondary | ICD-10-CM | POA: Diagnosis not present

## 2021-02-26 DIAGNOSIS — M5416 Radiculopathy, lumbar region: Secondary | ICD-10-CM | POA: Diagnosis not present

## 2021-02-26 DIAGNOSIS — Z13220 Encounter for screening for lipoid disorders: Secondary | ICD-10-CM | POA: Diagnosis not present

## 2021-02-26 DIAGNOSIS — M4316 Spondylolisthesis, lumbar region: Secondary | ICD-10-CM | POA: Diagnosis not present

## 2021-02-26 LAB — BASIC METABOLIC PANEL
BUN: 14 mg/dL (ref 6–23)
CO2: 28 mEq/L (ref 19–32)
Calcium: 9.4 mg/dL (ref 8.4–10.5)
Chloride: 103 mEq/L (ref 96–112)
Creatinine, Ser: 0.88 mg/dL (ref 0.40–1.20)
GFR: 68.33 mL/min (ref >60.00)
Glucose, Bld: 91 mg/dL (ref 70–99)
Potassium: 3.9 mEq/L (ref 3.5–5.1)
Sodium: 139 mEq/L (ref 135–145)

## 2021-02-26 LAB — HEPATIC FUNCTION PANEL
ALT: 16 U/L (ref 0–35)
AST: 21 U/L (ref 0–37)
Albumin: 4.1 g/dL (ref 3.5–5.2)
Alkaline Phosphatase: 66 U/L (ref 39–117)
Bilirubin, Direct: 0.1 mg/dL (ref 0.0–0.3)
Total Bilirubin: 0.6 mg/dL (ref 0.2–1.2)
Total Protein: 6.7 g/dL (ref 6.0–8.3)

## 2021-02-26 LAB — LIPID PANEL
Cholesterol: 191 mg/dL (ref 0–200)
HDL: 66 mg/dL (ref >39.00)
LDL Cholesterol: 109 mg/dL — ABNORMAL HIGH (ref 0–99)
NonHDL: 124.91
Total CHOL/HDL Ratio: 3
Triglycerides: 79 mg/dL (ref 0.0–149.0)
VLDL: 15.8 mg/dL (ref 0.0–40.0)

## 2021-02-26 LAB — TSH: TSH: 2.3 u[IU]/mL (ref 0.35–5.50)

## 2021-02-26 LAB — HEMOGLOBIN A1C: Hgb A1c MFr Bld: 5.8 % (ref 4.6–6.5)

## 2021-02-28 ENCOUNTER — Other Ambulatory Visit: Payer: Self-pay

## 2021-02-28 ENCOUNTER — Ambulatory Visit: Payer: Medicare PPO | Admitting: Internal Medicine

## 2021-02-28 VITALS — BP 122/70 | HR 63 | Temp 97.5°F | Resp 16 | Ht 61.0 in | Wt 139.2 lb

## 2021-02-28 DIAGNOSIS — R739 Hyperglycemia, unspecified: Secondary | ICD-10-CM

## 2021-02-28 DIAGNOSIS — L4050 Arthropathic psoriasis, unspecified: Secondary | ICD-10-CM | POA: Diagnosis not present

## 2021-02-28 DIAGNOSIS — M5416 Radiculopathy, lumbar region: Secondary | ICD-10-CM | POA: Diagnosis not present

## 2021-02-28 DIAGNOSIS — E559 Vitamin D deficiency, unspecified: Secondary | ICD-10-CM | POA: Diagnosis not present

## 2021-02-28 DIAGNOSIS — Z13220 Encounter for screening for lipoid disorders: Secondary | ICD-10-CM

## 2021-02-28 DIAGNOSIS — F439 Reaction to severe stress, unspecified: Secondary | ICD-10-CM

## 2021-02-28 DIAGNOSIS — M8580 Other specified disorders of bone density and structure, unspecified site: Secondary | ICD-10-CM

## 2021-02-28 NOTE — Progress Notes (Signed)
Patient ID: April Santos, female   DOB: 20-Dec-1955, 65 y.o.   MRN: [id number]   Subjective:    Patient ID: April Santos, female    DOB: 10-Feb-1956, 65 y.o.   MRN: [id number]  This visit occurred during the SARS-CoV-2 public health emergency.  Safety protocols were in place, including screening questions prior to the visit, additional usage of staff PPE, and extensive cleaning of exam room while observing appropriate contact time as indicated for disinfecting solutions.   Patient here for a scheduled follow up.   Chief Complaint  Patient presents with   Follow-up    Review dexa scan. Review labs.    Marland Kitchen   HPI Has seen ortho recently.  S/p trigger finger injection.  Also seeing Dr Sharlet Salina.  S/p injection- back.  Right side did well.  Left side - still discomfort.  Discussing another injection - end of January.  Taking gabapentin.  Completed PT on thumb.  Discussed bone density.  Discussed osteopenia.  Continue weight bearing exercise, calcium and vitamin d.  No chest pain.  Breathings stable.  No abdominal pain or bowel change reported.     Past Medical History:  Diagnosis Date   Arthritis    Colon polyps    COVID-19 2022   Disc disorder    Dysphagia    Family history of adverse reaction to anesthesia    mother nausea   GERD (gastroesophageal reflux disease)    History of uterine fibroid    Psoriasis    Past Surgical History:  Procedure Laterality Date   ABDOMINAL HYSTERECTOMY     BREAST SURGERY  1999   biopsy   carpal  2005   Tunnel right hand   CARPOMETACARPAL ([place]) FUSION OF THUMB Right 11/16/2020   Procedure: Right CARPOMETACARPAL [facility]) ARTHROPLASTY;  Surgeon: Hessie Knows, MD;  Location: [facility] ORS;  Service: Orthopedics;  Laterality: Right;   COLONOSCOPY WITH ESOPHAGOGASTRODUODENOSCOPY (EGD)     COLONOSCOPY WITH PROPOFOL N/A 06/10/2016   Procedure: COLONOSCOPY WITH PROPOFOL;  Surgeon: Manya Silvas, MD;  Location: [facility] ENDOSCOPY;  Service: Endoscopy;  Laterality: N/A;    ESOPHAGOGASTRODUODENOSCOPY (EGD) WITH PROPOFOL N/A 06/10/2016   Procedure: ESOPHAGOGASTRODUODENOSCOPY (EGD) WITH PROPOFOL;  Surgeon: Manya Silvas, MD;  Location: [facility] ENDOSCOPY;  Service: Endoscopy;  Laterality: N/A;   FOOT SURGERY  02/07/1003   right foot benign tumor   SACROILIAC JOINT INJECTION  08/17/13 & 04/13/14   Dr. Sharlet Salina   SIGMOIDOSCOPY     UTERINE FIBROID SURGERY  1997   Family History  Problem Relation Age of Onset   Breast cancer Mother        double masectomy   Arthritis Father    GER disease Father    Heart disease Other        paternal uncles   Colon cancer Neg Hx    Social History   Socioeconomic History   Marital status: Significant Other    Spouse name: Bryson Corona   Number of children: 0   Years of education: Not on file   Highest education level: Not on file  Occupational History   Not on file  Tobacco Use   Smoking status: Never   Smokeless tobacco: Never  Substance and Sexual Activity   Alcohol use: Yes    Alcohol/week: 1.0 standard drink    Types: 1 Glasses of wine per week    Comment: bimonthly   Drug use: No   Sexual activity: Yes  Other Topics Concern   Not on file  Social History Narrative   Bryson Corona - POA   Social Determinants of Health   Financial Resource Strain: Low Risk    Difficulty of Paying Living Expenses: Not hard at all  Food Insecurity: No Food Insecurity   Worried About Charity fundraiser in the Last Year: Never true   Ran Out of Food in the Last Year: Never true  Transportation Needs: No Transportation Needs   Lack of Transportation (Medical): No   Lack of Transportation (Non-Medical): No  Physical Activity: Sufficiently Active   Days of Exercise per Week: 5 days   Minutes of Exercise per Session: 30 min  Stress: No Stress Concern Present   Feeling of Stress : Not at all  Social Connections: Unknown   Frequency of Communication with Friends and Family: Not on file   Frequency of Social Gatherings with  Friends and Family: Not on file   Attends Religious Services: Not on file   Active Member of Clubs or Organizations: Not on file   Attends Archivist Meetings: Not on file   Marital Status: Married     Review of Systems  Constitutional:  Negative for appetite change and unexpected weight change.  HENT:  Negative for congestion and sinus pressure.   Respiratory:  Negative for cough, chest tightness and shortness of breath.   Cardiovascular:  Negative for chest pain, palpitations and leg swelling.  Gastrointestinal:  Negative for abdominal pain, diarrhea, nausea and vomiting.  Genitourinary:  Negative for difficulty urinating and dysuria.  Musculoskeletal:  Negative for myalgias.       Back pain as outlined.   Skin:  Negative for color change and rash.  Neurological:  Negative for dizziness, light-headedness and headaches.  Psychiatric/Behavioral:  Negative for agitation and dysphoric mood.       Objective:     BP 122/70   Pulse 63   Temp (!) 97.5 F (36.4 C)   Resp 16   Ht $R'5\' 1"'lu$  (1.549 m)   Wt 139 lb 3.2 oz (63.1 kg)   LMP 02/26/2002   SpO2 98%   BMI 26.30 kg/m  Wt Readings from Last 3 Encounters:  02/28/21 139 lb 3.2 oz (63.1 kg)  11/24/20 140 lb (63.5 kg)  10/31/20 142 lb (64.4 kg)    Physical Exam Vitals reviewed.  Constitutional:      General: She is not in acute distress.    Appearance: Normal appearance.  HENT:     Head: Normocephalic and atraumatic.     Right Ear: External ear normal.     Left Ear: External ear normal.  Eyes:     General: No scleral icterus.       Right eye: No discharge.        Left eye: No discharge.     Conjunctiva/sclera: Conjunctivae normal.  Neck:     Thyroid: No thyromegaly.  Cardiovascular:     Rate and Rhythm: Normal rate and regular rhythm.  Pulmonary:     Effort: No respiratory distress.     Breath sounds: Normal breath sounds. No wheezing.  Abdominal:     General: Bowel sounds are normal.     Palpations:  Abdomen is soft.     Tenderness: There is no abdominal tenderness.  Musculoskeletal:        General: No swelling or tenderness.     Cervical back: Neck supple. No tenderness.  Lymphadenopathy:     Cervical: No cervical adenopathy.  Skin:    Findings: No erythema or rash.  Neurological:     Mental Status: She is alert.  Psychiatric:        Mood and Affect: Mood normal.        Behavior: Behavior normal.     Outpatient Encounter Medications as of 02/28/2021  Medication Sig   ALPRAZolam (XANAX) 0.25 MG tablet Take 1 tablet (0.25 mg total) by mouth at bedtime as needed.   Apremilast (OTEZLA) 30 MG TABS Take 30 mg by mouth daily.   B Complex-C (B-COMPLEX WITH VITAMIN C) tablet Take 1 tablet by mouth daily.   chlorpheniramine-HYDROcodone (TUSSIONEX PENNKINETIC ER) 10-8 MG/5ML SUER Take 5 mLs by mouth every 12 (twelve) hours as needed for cough.   cholecalciferol (VITAMIN D3) 25 MCG (1000 UNIT) tablet Take 1,000 Units by mouth daily.   Crisaborole (EUCRISA) 2 % OINT Apply 1 application topically 2 (two) times daily as needed (psoriasis).   desonide (DESOWEN) 0.05 % cream Apply 1 application topically 2 (two) times daily as needed (psoriasis).   estradiol (ESTRACE) 0.1 MG/GM vaginal cream Place 1 Applicatorful vaginally once a week.   estradiol (VIVELLE-DOT) 0.05 MG/24HR patch Place 1 patch (0.05 mg total) onto the skin 2 (two) times a week.   gabapentin (NEURONTIN) 300 MG capsule Take 300 mg by mouth at bedtime.   HYDROcodone-acetaminophen (NORCO/VICODIN) 5-325 MG tablet Take 1-2 tablets by mouth every 4 (four) hours as needed for moderate pain.   RABEprazole (ACIPHEX) 20 MG tablet TAKE ONE TABLET BY MOUTH EVERY DAY   No facility-administered encounter medications on file as of 02/28/2021.     Lab Results  Component Value Date   WBC 4.3 09/06/2020   HGB 12.6 09/06/2020   HCT 37.1 09/06/2020   PLT 282.0 09/06/2020   GLUCOSE 91 02/26/2021   CHOL 191 02/26/2021   TRIG 79.0 02/26/2021    HDL 66.00 02/26/2021   LDLCALC 109 (H) 02/26/2021   ALT 16 02/26/2021   AST 21 02/26/2021   NA 139 02/26/2021   K 3.9 02/26/2021   CL 103 02/26/2021   CREATININE 0.88 02/26/2021   BUN 14 02/26/2021   CO2 28 02/26/2021   TSH 2.30 02/26/2021   HGBA1C 5.8 02/26/2021    DG Wrist 2 Views Right  Result Date: 11/16/2020 CLINICAL DATA:  Post arthroplasty of finger of RIGHT hand EXAM: RIGHT WRIST - 2 VIEW COMPARISON:  03/11/2016 FINDINGS: Fiberglass splint along the radial aspect of wrist extending to thumb. Interval resection of trapezium. Suture anchors at base of first metacarpal and at second metacarpal shaft. Small amount of bony debris at the surgical bed. No fracture, dislocation, or bone destruction. IMPRESSION: Post resection of trapezium. Electronically Signed   By: Lavonia Dana M.D.   On: 11/16/2020 16:09   DG MINI C-ARM IMAGE ONLY  Result Date: 11/16/2020 There is no interpretation for this exam.  This order is for images obtained during a surgical procedure.  Please See "Surgeries" Tab for more information regarding the procedure.       Assessment & Plan:   Problem List Items Addressed This Visit     Hyperglycemia    The 10-year ASCVD risk score (Arnett DK, et al., 2019) is: 5.2%   Values used to calculate the score:     Age: 12 years     Sex: Female     Is Non-Hispanic African American: No     Diabetic: No     Tobacco smoker: No     Systolic Blood Pressure: 702 mmHg     Is BP treated:  No     HDL Cholesterol: 66 mg/dL     Total Cholesterol: 191 mg/dL  Low carb diet and exercise.  Follow met b, a1c and lipid panel.       Relevant Orders   Basic metabolic panel   Hemoglobin A1c   CBC with Differential/Platelet   Lumbar radiculitis    S/p injection.  Planned f/u as outlined.       Osteopenia    Discussed bone density results.  Discussed osteopenia.  Continue calcium, vitamin D and weight bearing exercise.        Psoriatic arthritis ([place])    Followed by  dermatology.  On otezla.        Stress    Overall appears to be handling things relatively well.      Vitamin D deficiency    Continue vitamin D supplements.       Other Visit Diagnoses     Screening cholesterol level    -  Primary   Relevant Orders   Hepatic function panel   Lipid panel        Einar Pheasant, MD

## 2021-02-28 NOTE — Assessment & Plan Note (Addendum)
The 10-year ASCVD risk score (Arnett DK, et al., 2019) is: 5.2%   Values used to calculate the score:     Age: 65 years     Sex: Female     Is Non-Hispanic African American: No     Diabetic: No     Tobacco smoker: No     Systolic Blood Pressure: 122 mmHg     Is BP treated: No     HDL Cholesterol: 66 mg/dL     Total Cholesterol: 191 mg/dL  Low carb diet and exercise.  Follow met b, a1c and lipid panel.  

## 2021-03-02 ENCOUNTER — Encounter: Payer: Self-pay | Admitting: Internal Medicine

## 2021-03-02 NOTE — Assessment & Plan Note (Signed)
Discussed bone density results.  Discussed osteopenia.  Continue calcium, vitamin D and weight bearing exercise.

## 2021-03-02 NOTE — Assessment & Plan Note (Signed)
S/p injection.  Planned f/u as outlined.

## 2021-03-02 NOTE — Assessment & Plan Note (Signed)
Continue vitamin D supplements.  

## 2021-03-02 NOTE — Assessment & Plan Note (Signed)
Followed by dermatology.  On otezla.   

## 2021-03-02 NOTE — Assessment & Plan Note (Signed)
Overall appears to be handling things relatively well. 

## 2021-03-19 ENCOUNTER — Encounter: Payer: Self-pay | Admitting: Internal Medicine

## 2021-03-23 ENCOUNTER — Encounter: Payer: Self-pay | Admitting: Internal Medicine

## 2021-03-23 ENCOUNTER — Telehealth: Payer: Self-pay | Admitting: Internal Medicine

## 2021-03-23 NOTE — Telephone Encounter (Signed)
error 

## 2021-03-23 NOTE — Telephone Encounter (Signed)
Patient called office, no appointments. Patient was transferred to Access Nurse.

## 2021-03-27 NOTE — Telephone Encounter (Signed)
Patient confirmed feeling better. Nothing further needed.

## 2021-04-10 ENCOUNTER — Ambulatory Visit: Payer: Medicare PPO | Admitting: Dermatology

## 2021-04-12 ENCOUNTER — Ambulatory Visit: Payer: Medicare PPO | Admitting: Dermatology

## 2021-04-12 DIAGNOSIS — H52223 Regular astigmatism, bilateral: Secondary | ICD-10-CM | POA: Diagnosis not present

## 2021-04-12 DIAGNOSIS — H5213 Myopia, bilateral: Secondary | ICD-10-CM | POA: Diagnosis not present

## 2021-04-12 DIAGNOSIS — Z135 Encounter for screening for eye and ear disorders: Secondary | ICD-10-CM | POA: Diagnosis not present

## 2021-04-12 DIAGNOSIS — H2513 Age-related nuclear cataract, bilateral: Secondary | ICD-10-CM | POA: Diagnosis not present

## 2021-04-18 NOTE — Progress Notes (Addendum)
PCP: Einar Pheasant, MD   Chief Complaint  Patient presents with   Gynecologic Exam    No concerns    HPI:      April Santos is a 66 y.o. No obstetric history on file. whose LMP was Patient's last menstrual period was 02/26/2002., presents today for her Medicare annual examination.  Her menses are absent due to menopause/hyst for leio in her 32s; question if BSO. No PMB. Started on estradiol 1 mg after hyst and has been on it since. Changed to vivelle dot 0.05 mg with Dr. Gilman Schmidt 4/22 due to concern of high dose of estrogen. Pt denies vasomotor sx on 0.05 mg dose. Did have rebound sx a few yrs ago when stopped ERT cold [place]. She is willing to wean off ERT.   Sex activity: single partner, contraception - post hysterectomy status. She does not have vaginal dryness. Uses estrace vag crm twice wkly and lubricants with sx control. Wants to cont vag ERT.  Last Pap: 03/20/20 Results were: low-grade squamous intraepithelial neoplasia (LGSIL - encompassing HPV,mild dysplasia,CIN I) /POS HPV DNA; pt without cx, no hx of CIN 2or 3. Will repeat pap today.  Hx of STDs: HPV  Last mammogram: 01/08/21 at [facility];  Results were: normal after addl views RT breast--routine follow-up in 12 months There is a FH of breast cancer in her mother twice, genetic testing not done. Pt doesn't meet Medicare guidelines for testing. There is no FH of ovarian cancer. The patient does do self-breast exams.  Colonoscopy: 2018 with Dr. Vira Agar with 3 polyps; Repeat due after ?? years. Pt unsure.  DEXA 10/22 at [facility]; low bone mass in hip, normal spine  Tobacco use: The patient denies current or previous tobacco use. Alcohol use:  rare No drug use Exercise: moderately active  She does get adequate calcium and Vitamin D in her diet.  Labs with PCP.   Patient Active Problem List   Diagnosis Date Noted   Post-menopausal atrophic vaginitis 04/19/2021   LGSIL Pap smear of vagina 04/19/2021   Arthritis of  carpometacarpal [facility]) joint of right thumb 11/26/2020   History of COVID-19 10/31/2020   Hyperglycemia 08/22/2020   Breast cancer screening 01/30/2020   Vitamin D deficiency 08/02/2018   Generalized osteoarthritis of hand 09/04/2017   Right hand pain 03/11/2016   Psoriatic arthritis ([place]) 02/11/2016   Left shoulder pain 02/05/2016   Low iron 06/05/2015   Skin lesion 06/05/2015   Current use of estrogen therapy 11/29/2014   HNP (herniated nucleus pulposus), lumbar 10/20/2014   Lumbar radiculitis 10/20/2014   Leg pain, left 05/16/2014   Health care maintenance 05/16/2014   Stress 10/17/2013   Osteoarthritis of knee 09/15/2013   Sacroiliitis ([place]) 08/17/2013   Right knee pain 07/20/2013   Abnormal mammogram 06/12/2013   Toenail fungus 06/12/2013   Osteopenia 05/14/2013   Carpal tunnel syndrome 11/26/2012   Environmental allergies 05/03/2012   History of condyloma acuminatum 05/03/2012   GERD (gastroesophageal reflux disease) 05/03/2012    Past Surgical History:  Procedure Laterality Date   ABDOMINAL HYSTERECTOMY     BREAST SURGERY  1999   biopsy   carpal  2005   Tunnel right hand   CARPOMETACARPAL ([place]) FUSION OF THUMB Right 11/16/2020   Procedure: Right CARPOMETACARPAL [facility]) ARTHROPLASTY;  Surgeon: Hessie Knows, MD;  Location: [facility] ORS;  Service: Orthopedics;  Laterality: Right;   COLONOSCOPY WITH ESOPHAGOGASTRODUODENOSCOPY (EGD)     COLONOSCOPY WITH PROPOFOL N/A 06/10/2016   Procedure: COLONOSCOPY WITH PROPOFOL;  Surgeon: Manya Silvas, MD;  Location[facility] ENDOSCOPY;  Service: Endoscopy;  Laterality: N/A;   ESOPHAGOGASTRODUODENOSCOPY (EGD) WITH PROPOFOL N/A 06/10/2016   Procedure: ESOPHAGOGASTRODUODENOSCOPY (EGD) WITH PROPOFOL;  Surgeon: Manya Silvas, MD;  Location: [facility] ENDOSCOPY;  Service: Endoscopy;  Laterality: N/A;   FOOT SURGERY  02/07/1003   right foot benign tumor   SACROILIAC JOINT INJECTION  08/17/13 & 04/13/14   Dr. Sharlet Salina   SIGMOIDOSCOPY     UTERINE FIBROID  SURGERY  1997    Family History  Problem Relation Age of Onset   Breast cancer Mother        double masectomy   Heart disease Father    Arthritis Father    GER disease Father    Colon cancer Neg Hx     Social History   Socioeconomic History   Marital status: Significant Other    Spouse name: Bryson Corona   Number of children: 0   Years of education: Not on file   Highest education level: Not on file  Occupational History   Not on file  Tobacco Use   Smoking status: Never   Smokeless tobacco: Never  Vaping Use   Vaping Use: Never used  Substance and Sexual Activity   Alcohol use: Yes    Alcohol/week: 1.0 standard drink    Types: 1 Glasses of wine per week    Comment: bimonthly   Drug use: No   Sexual activity: Yes    Birth control/protection: Surgical    Comment: Hysterectomy  Other Topics Concern   Not on file  Social History Narrative   Bryson Corona - POA   Social Determinants of Health   Financial Resource Strain: Low Risk    Difficulty of Paying Living Expenses: Not hard at all  Food Insecurity: No Food Insecurity   Worried About Charity fundraiser in the Last Year: Never true   Ran Out of Food in the Last Year: Never true  Transportation Needs: No Transportation Needs   Lack of Transportation (Medical): No   Lack of Transportation (Non-Medical): No  Physical Activity: Sufficiently Active   Days of Exercise per Week: 5 days   Minutes of Exercise per Session: 30 min  Stress: No Stress Concern Present   Feeling of Stress : Not at all  Social Connections: Unknown   Frequency of Communication with Friends and Family: Not on file   Frequency of Social Gatherings with Friends and Family: Not on file   Attends Religious Services: Not on Electrical engineer or Organizations: Not on file   Attends Archivist Meetings: Not on file   Marital Status: Married  Human resources officer Violence: Not At Risk   Fear of Current or Ex-Partner: No    Emotionally Abused: No   Physically Abused: No   Sexually Abused: No     Current Outpatient Medications:    ALPRAZolam (XANAX) 0.25 MG tablet, Take 1 tablet (0.25 mg total) by mouth at bedtime as needed., Disp: 30 tablet, Rfl: 0   Apremilast (OTEZLA) 30 MG TABS, Take 30 mg by mouth daily., Disp: , Rfl:    B Complex-C (B-COMPLEX WITH VITAMIN C) tablet, Take 1 tablet by mouth daily., Disp: , Rfl:    cholecalciferol (VITAMIN D3) 25 MCG (1000 UNIT) tablet, Take 1,000 Units by mouth daily., Disp: , Rfl:    Crisaborole (EUCRISA) 2 % OINT, Apply 1 application topically 2 (two) times daily as needed (psoriasis)., Disp: 60 g, Rfl: 1  desonide (DESOWEN) 0.05 % cream, Apply 1 application topically 2 (two) times daily as needed (psoriasis)., Disp: , Rfl:    estradiol (VIVELLE-DOT) 0.025 MG/24HR, Place 1 patch onto the skin 2 (two) times a week., Disp: 24 patch, Rfl: 0   gabapentin (NEURONTIN) 300 MG capsule, Take 300 mg by mouth at bedtime., Disp: , Rfl:    RABEprazole (ACIPHEX) 20 MG tablet, TAKE ONE TABLET BY MOUTH EVERY DAY, Disp: 30 tablet, Rfl: 2   estradiol (ESTRACE) 0.1 MG/GM vaginal cream, Insert 1 gram vaginally 1-2 times weekly, Disp: 42.5 g, Rfl: 2     ROS:  Review of Systems  Constitutional:  Negative for fatigue, fever and unexpected weight change.  Respiratory:  Negative for cough, shortness of breath and wheezing.   Cardiovascular:  Negative for chest pain, palpitations and leg swelling.  Gastrointestinal:  Negative for blood in stool, constipation, diarrhea, nausea and vomiting.  Endocrine: Negative for cold intolerance, heat intolerance and polyuria.  Genitourinary:  Negative for dyspareunia, dysuria, flank pain, frequency, genital sores, hematuria, menstrual problem, pelvic pain, urgency, vaginal bleeding, vaginal discharge and vaginal pain.  Musculoskeletal:  Negative for back pain, joint swelling and myalgias.  Skin:  Negative for rash.  Neurological:  Negative for dizziness,  syncope, light-headedness, numbness and headaches.  Hematological:  Negative for adenopathy.  Psychiatric/Behavioral:  Negative for agitation, confusion, sleep disturbance and suicidal ideas. The patient is not nervous/anxious.   BREAST: No symptoms    Objective: BP 110/60    Ht 5\' 1"  (1.549 m)    Wt 138 lb (62.6 kg)    LMP 02/26/2002    BMI 26.07 kg/m    Physical Exam Constitutional:      Appearance: She is well-developed.  Genitourinary:     Vulva normal.     Genitourinary Comments: UTERUS/CX SURG REM     Right Labia: No rash, tenderness or lesions.    Left Labia: No tenderness, lesions or rash.    Vaginal cuff intact.    No vaginal discharge, erythema or tenderness.     No vaginal atrophy present.     Right Adnexa: not tender and no mass present.    Left Adnexa: not tender and no mass present.    Cervix is absent.     Uterus is absent.  Breasts:    Right: No mass, nipple discharge, skin change or tenderness.     Left: No mass, nipple discharge, skin change or tenderness.  Neck:     Thyroid: No thyromegaly.  Cardiovascular:     Rate and Rhythm: Normal rate and regular rhythm.     Heart sounds: Normal heart sounds. No murmur heard. Pulmonary:     Effort: Pulmonary effort is normal.     Breath sounds: Normal breath sounds.  Abdominal:     Palpations: Abdomen is soft.     Tenderness: There is no abdominal tenderness. There is no guarding.  Musculoskeletal:        General: Normal range of motion.     Cervical back: Normal range of motion.  Neurological:     General: No focal deficit present.     Mental Status: She is alert and oriented to person, place, and time.     Cranial Nerves: No cranial nerve deficit.  Skin:    General: Skin is warm and dry.  Psychiatric:        Mood and Affect: Mood normal.        Behavior: Behavior normal.  Thought Content: Thought content normal.        Judgment: Judgment normal.  Vitals reviewed.     Assessment/Plan:  Encounter for annual routine gynecological examination  Vaginal Pap smear - Plan: Cytology - PAP  LGSIL Pap smear of vagina - Plan: Cytology - PAP; repeat pap today. Will f/u with results.   Screening for HPV (human papillomavirus) - Plan: Cytology - PAP  Encounter for screening mammogram for malignant neoplasm of breast; pt current on mammo  Family history of breast cancer--pt doesn't meet Medicare genetic testing guidelines but is at increased risk due to [place]. Cont to follow FH. Cont yearly mammos.   Screening for colon cancer--pt to call [facility] to find out when colonoscopy due. Hx of colon polyps. Will do ref prn.  Vasomotor symptoms due to menopause - Plan: estradiol (VIVELLE-DOT) 0.025 MG/24HR; no sx on 0.05 mg dose, decrease to 0.025 mg dose for a couple months. Rx eRxd. Pt then to wean down to 1/2 patch (not technically accurate but can be done off label), then stop. F/u prn.   Hormone replacement therapy (postmenopausal) - Plan: estradiol (VIVELLE-DOT) 0.025 MG/24HR, estradiol (ESTRACE) 0.1 MG/GM vaginal cream  Post-menopausal atrophic vaginitis - Plan: estradiol (ESTRACE) 0.1 MG/GM vaginal cream; Rx RF estrace crm. Can do 1-2 times wkly. F/u prn.    Meds ordered this encounter  Medications   estradiol (VIVELLE-DOT) 0.025 MG/24HR    Sig: Place 1 patch onto the skin 2 (two) times a week.    Dispense:  24 patch    Refill:  0    Order Specific Question:   Supervising Provider    Answer:   Gae Dry [[id number]]   estradiol (ESTRACE) 0.1 MG/GM vaginal cream    Sig: Insert 1 gram vaginally 1-2 times weekly    Dispense:  42.5 g    Refill:  2    Order Specific Question:   Supervising Provider    Answer:   Gae Dry [[id number]]            GYN counsel breast self exam, mammography screening, use and side effects of HRT, menopause, adequate intake of calcium and vitamin D, diet and exercise    F/U  Return in about 1 year (around  04/19/2022).  Kimari Coudriet B. Vincenzo Stave, PA-C 04/19/2021 9:21 AM

## 2021-04-19 ENCOUNTER — Ambulatory Visit (INDEPENDENT_AMBULATORY_CARE_PROVIDER_SITE_OTHER): Payer: Medicare PPO | Admitting: Obstetrics and Gynecology

## 2021-04-19 ENCOUNTER — Encounter: Payer: Self-pay | Admitting: Obstetrics and Gynecology

## 2021-04-19 ENCOUNTER — Other Ambulatory Visit: Payer: Self-pay

## 2021-04-19 ENCOUNTER — Other Ambulatory Visit (HOSPITAL_COMMUNITY)
Admission: RE | Admit: 2021-04-19 | Discharge: 2021-04-19 | Disposition: A | Discharge status: Home / Self Care | Payer: Medicare PPO | Source: Ambulatory Visit | Attending: Obstetrics and Gynecology | Admitting: Obstetrics and Gynecology

## 2021-04-19 VITALS — BP 110/60 | Ht 61.0 in | Wt 138.0 lb

## 2021-04-19 DIAGNOSIS — Z01419 Encounter for gynecological examination (general) (routine) without abnormal findings: Secondary | ICD-10-CM | POA: Diagnosis not present

## 2021-04-19 DIAGNOSIS — Z1211 Encounter for screening for malignant neoplasm of colon: Secondary | ICD-10-CM

## 2021-04-19 DIAGNOSIS — Z124 Encounter for screening for malignant neoplasm of cervix: Secondary | ICD-10-CM | POA: Insufficient documentation

## 2021-04-19 DIAGNOSIS — R87622 Low grade squamous intraepithelial lesion on cytologic smear of vagina (LGSIL): Secondary | ICD-10-CM | POA: Diagnosis not present

## 2021-04-19 DIAGNOSIS — Z79890 Hormone replacement therapy (postmenopausal): Secondary | ICD-10-CM | POA: Diagnosis not present

## 2021-04-19 DIAGNOSIS — R87612 Low grade squamous intraepithelial lesion on cytologic smear of cervix (LGSIL): Secondary | ICD-10-CM | POA: Diagnosis not present

## 2021-04-19 DIAGNOSIS — N952 Postmenopausal atrophic vaginitis: Secondary | ICD-10-CM | POA: Diagnosis not present

## 2021-04-19 DIAGNOSIS — Z1151 Encounter for screening for human papillomavirus (HPV): Secondary | ICD-10-CM | POA: Insufficient documentation

## 2021-04-19 DIAGNOSIS — Z1231 Encounter for screening mammogram for malignant neoplasm of breast: Secondary | ICD-10-CM | POA: Diagnosis not present

## 2021-04-19 DIAGNOSIS — N951 Menopausal and female climacteric states: Secondary | ICD-10-CM | POA: Diagnosis not present

## 2021-04-19 DIAGNOSIS — Z1272 Encounter for screening for malignant neoplasm of vagina: Secondary | ICD-10-CM

## 2021-04-19 DIAGNOSIS — Z803 Family history of malignant neoplasm of breast: Secondary | ICD-10-CM

## 2021-04-19 MED ORDER — ESTRADIOL 0.025 MG/24HR TD PTTW: 1.0000 | MEDICATED_PATCH | TRANSDERMAL | 0 refills | Status: DC

## 2021-04-19 MED ORDER — ESTRADIOL 0.1 MG/GM VA CREA: TOPICAL_CREAM | VAGINAL | 2 refills | Status: DC

## 2021-04-19 NOTE — Patient Instructions (Signed)
I value your feedback and you entrusting us with your care. If you get a [place] patient survey, I would appreciate you taking the time to let us know about your experience today. Thank you! ? ? ?

## 2021-04-24 LAB — CYTOLOGY - PAP
Comment: NEGATIVE
Diagnosis: UNDETERMINED — AB
High risk HPV: NEGATIVE

## 2021-05-08 ENCOUNTER — Other Ambulatory Visit: Payer: Self-pay | Admitting: Obstetrics and Gynecology

## 2021-05-08 DIAGNOSIS — Z79890 Hormone replacement therapy (postmenopausal): Secondary | ICD-10-CM

## 2021-05-08 DIAGNOSIS — N951 Menopausal and female climacteric states: Secondary | ICD-10-CM

## 2021-05-11 ENCOUNTER — Other Ambulatory Visit: Payer: Self-pay | Admitting: Internal Medicine

## 2021-05-21 ENCOUNTER — Other Ambulatory Visit: Payer: Self-pay

## 2021-05-21 DIAGNOSIS — L409 Psoriasis, unspecified: Secondary | ICD-10-CM

## 2021-05-21 MED ORDER — EUCRISA 2 % EX OINT: 1.0000 "application " | TOPICAL_OINTMENT | Freq: Two times a day (BID) | CUTANEOUS | 1 refills | Status: DC | PRN

## 2021-05-21 NOTE — Progress Notes (Signed)
Eucrisa RF. Aw

## 2021-05-22 ENCOUNTER — Ambulatory Visit (INDEPENDENT_AMBULATORY_CARE_PROVIDER_SITE_OTHER): Payer: Self-pay | Admitting: Dermatology

## 2021-05-22 ENCOUNTER — Other Ambulatory Visit: Payer: Self-pay

## 2021-05-22 DIAGNOSIS — L988 Other specified disorders of the skin and subcutaneous tissue: Secondary | ICD-10-CM

## 2021-05-22 NOTE — Progress Notes (Signed)
° °  Follow-Up Visit   Subjective  April Santos is a 66 y.o. female who presents for the following: Facial Elastosis (Patient is here today for Botox injections. She would like to discuss adding more to raise her R sup forehead as she has more wrinkling on that side.).  The following portions of the chart were reviewed this encounter and updated as appropriate:   Tobacco   Allergies   Meds   Problems   Med Hx   Surg Hx   Fam Hx      Review of Systems:  No other skin or systemic complaints except as noted in HPI or Assessment and Plan.  Objective  Well appearing patient in no apparent distress; mood and affect are within normal limits.  A focused examination was performed including the face. Relevant physical exam findings are noted in the Assessment and Plan.  Face Rhytides and volume loss.            Assessment & Plan  Elastosis of skin Face  Botox 58.75 units injected today to: - Frown complex 32.5 units - Brow lift 5 units x 2 - Crow's feet 7.5 units x 2 - R sup forehead 1.25 units  Added 1.25 units to the R sup forehead since patient has more obviously wrinkling on that side. Will recheck at f/u appt.   Botox Injection - Face Location: See attached image  Informed consent: Discussed risks (infection, pain, bleeding, bruising, swelling, allergic reaction, paralysis of nearby muscles, eyelid droop, double vision, neck weakness, difficulty breathing, headache, undesirable cosmetic result, and need for additional treatment) and benefits of the procedure, as well as the alternatives.  Informed consent was obtained.  Preparation: The area was cleansed with alcohol.  Procedure Details:  Botox was injected into the dermis with a 30-gauge needle. Pressure applied to any bleeding. Ice packs offered for swelling.  Lot Number:  [id number] Expiration:  06/2023  Total Units Injected:  58.75  Plan: Patient was instructed to remain upright for 4 hours. Patient was instructed  to avoid massaging the face and avoid vigorous exercise for the rest of the day. Tylenol may be used for headache.  Allow 2 weeks before returning to clinic for additional dosing as needed. Patient will call for any problems.    Return in about 3 months (around 08/19/2021) for Botox injections.  Luther Redo, CMA, am acting as scribe for Sarina Ser, MD . Documentation: I have reviewed the above documentation for accuracy and completeness, and I agree with the above.  Sarina Ser, MD

## 2021-05-22 NOTE — Patient Instructions (Signed)

## 2021-05-23 ENCOUNTER — Encounter: Payer: Self-pay | Admitting: Dermatology

## 2021-05-28 ENCOUNTER — Telehealth: Payer: Self-pay

## 2021-05-28 ENCOUNTER — Other Ambulatory Visit: Payer: Self-pay

## 2021-05-28 DIAGNOSIS — L409 Psoriasis, unspecified: Secondary | ICD-10-CM

## 2021-05-28 MED ORDER — EUCRISA 2 % EX OINT: 1.0000 "application " | TOPICAL_OINTMENT | Freq: Two times a day (BID) | CUTANEOUS | 1 refills | Status: AC | PRN

## 2021-05-28 NOTE — Telephone Encounter (Signed)
Pt called requesting we fax Eucrisa rx to [facility], ok erx'd Eucrisa ointment

## 2021-07-03 ENCOUNTER — Other Ambulatory Visit (INDEPENDENT_AMBULATORY_CARE_PROVIDER_SITE_OTHER): Payer: Medicare PPO

## 2021-07-03 ENCOUNTER — Other Ambulatory Visit: Payer: Self-pay

## 2021-07-03 DIAGNOSIS — Z13220 Encounter for screening for lipoid disorders: Secondary | ICD-10-CM

## 2021-07-03 DIAGNOSIS — R739 Hyperglycemia, unspecified: Secondary | ICD-10-CM | POA: Diagnosis not present

## 2021-07-03 LAB — HEPATIC FUNCTION PANEL
ALT: 16 U/L (ref 0–35)
AST: 19 U/L (ref 0–37)
Albumin: 4.1 g/dL (ref 3.5–5.2)
Alkaline Phosphatase: 70 U/L (ref 39–117)
Bilirubin, Direct: 0.1 mg/dL (ref 0.0–0.3)
Total Bilirubin: 0.4 mg/dL (ref 0.2–1.2)
Total Protein: 6.9 g/dL (ref 6.0–8.3)

## 2021-07-03 LAB — CBC WITH DIFFERENTIAL/PLATELET
Basophils Absolute: 0 10*3/uL (ref 0.0–0.1)
Basophils Relative: 0.6 % (ref 0.0–3.0)
Eosinophils Absolute: 0.1 10*3/uL (ref 0.0–0.7)
Eosinophils Relative: 1.6 % (ref 0.0–5.0)
HCT: 40.3 % (ref 36.0–46.0)
Hemoglobin: 13.3 g/dL (ref 12.0–15.0)
Lymphocytes Relative: 38.2 % (ref 12.0–46.0)
Lymphs Abs: 1.6 10*3/uL (ref 0.7–4.0)
MCHC: 32.9 g/dL (ref 30.0–36.0)
MCV: 87 fl (ref 78.0–100.0)
Monocytes Absolute: 0.4 10*3/uL (ref 0.1–1.0)
Monocytes Relative: 9.7 % (ref 3.0–12.0)
Neutro Abs: 2.1 10*3/uL (ref 1.4–7.7)
Neutrophils Relative %: 49.9 % (ref 43.0–77.0)
Platelets: 270 10*3/uL (ref 150.0–400.0)
RBC: 4.63 Mil/uL (ref 3.87–5.11)
RDW: 13.5 % (ref 11.5–15.5)
WBC: 4.3 10*3/uL (ref 4.0–10.5)

## 2021-07-03 LAB — LIPID PANEL
Cholesterol: 182 mg/dL (ref 0–200)
HDL: 62.5 mg/dL (ref >39.00)
LDL Cholesterol: 99 mg/dL (ref 0–99)
NonHDL: 119.13
Total CHOL/HDL Ratio: 3
Triglycerides: 103 mg/dL (ref 0.0–149.0)
VLDL: 20.6 mg/dL (ref 0.0–40.0)

## 2021-07-03 LAB — BASIC METABOLIC PANEL
BUN: 19 mg/dL (ref 6–23)
CO2: 27 mEq/L (ref 19–32)
Calcium: 9.3 mg/dL (ref 8.4–10.5)
Chloride: 105 mEq/L (ref 96–112)
Creatinine, Ser: 0.8 mg/dL (ref 0.40–1.20)
GFR: 76.43 mL/min (ref >60.00)
Glucose, Bld: 94 mg/dL (ref 70–99)
Potassium: 3.9 mEq/L (ref 3.5–5.1)
Sodium: 139 mEq/L (ref 135–145)

## 2021-07-03 LAB — HEMOGLOBIN A1C: Hgb A1c MFr Bld: 5.8 % (ref 4.6–6.5)

## 2021-07-04 ENCOUNTER — Ambulatory Visit: Payer: Medicare PPO | Admitting: Dermatology

## 2021-07-04 DIAGNOSIS — L409 Psoriasis, unspecified: Secondary | ICD-10-CM

## 2021-07-04 DIAGNOSIS — L2081 Atopic neurodermatitis: Secondary | ICD-10-CM

## 2021-07-04 DIAGNOSIS — L820 Inflamed seborrheic keratosis: Secondary | ICD-10-CM | POA: Diagnosis not present

## 2021-07-04 DIAGNOSIS — Z79899 Other long term (current) drug therapy: Secondary | ICD-10-CM

## 2021-07-04 DIAGNOSIS — L988 Other specified disorders of the skin and subcutaneous tissue: Secondary | ICD-10-CM | POA: Diagnosis not present

## 2021-07-04 MED ORDER — VTAMA 1 % EX CREA: 1.0000 "application " | TOPICAL_CREAM | Freq: Every day | CUTANEOUS | 6 refills | Status: DC

## 2021-07-04 NOTE — Progress Notes (Signed)
? ?Follow-Up Visit ?  ?Subjective  ?April Santos is a 66 y.o. female who presents for the following: Psoriasis (Feet, elbows, R knee, Otezla '30mg'$  1 po qd with no s/e, Eucrisa oint qd for hands) and check spot (R upper arm, 63yr was brown and now red no symptoms). ?The patient has spots, moles and lesions to be evaluated, some may be new or changing and the patient has concerns that these could be cancer. ? ?The following portions of the chart were reviewed this encounter and updated as appropriate:  ? Tobacco  Allergies  Meds  Problems  Med Hx  Surg Hx  Fam Hx   ?  ?Review of Systems:  No other skin or systemic complaints except as noted in HPI or Assessment and Plan. ? ?Objective  ?Well appearing patient in no apparent distress; mood and affect are within normal limits. ? ?A focused examination was performed including feet, elbows, knees, right arm. Relevant physical exam findings are noted in the Assessment and Plan. ? ?Right Knee - Anterior ?Pink psoriatic patch R knee ? ?R ant distal deltoid x 1, L thigh x 1 (2) ?Stuck on waxy paps with erythema ? ?bil hands ?Hands clear today ? ?face ?Rhytides and volume loss.  ? ? ?Assessment & Plan  ?Psoriasis ?Right Knee - Anterior ? ?Psoriasis is a chronic non-curable, but treatable genetic/hereditary disease that may have other systemic features affecting other organ systems such as joints (Psoriatic Arthritis). It is associated with an increased risk of inflammatory bowel disease, heart disease, non-alcoholic fatty liver disease, and depression.    ?Chronic and persistent condition with duration or expected duration over one year. Condition is symptomatic/ bothersome to patient. Not currently at goal -but much improved on treatment. ?Improved on Otezla '30mg'$  1 po qd ? ? ?Start Vtama cr qd to aa psoriasis on feet, knees ?Cont Otezla '30mg'$  1 po qd ? ?Side effects of Otezla (apremilast) include diarrhea, nausea, headache, upper respiratory infection, depression, and  weight decrease (5-10%). It should only be taken by pregnant women after a discussion regarding risks and benefits with their doctor. Goal is control of skin condition, not cure.  The use of ORutherford Nailrequires long term medication management, including periodic office visits.  ? ?Tapinarof (VTAMA) 1 % CREA - Right Knee - Anterior ?Apply 1 application. topically daily. Qd to aa psoriasis ? ?Related Medications ?Crisaborole (EUCRISA) 2 % OINT ?Apply 1 application topically 2 (two) times daily as needed (psoriasis). ? ?Inflamed seborrheic keratosis (2) ?R ant distal deltoid x 1, L thigh x 1 ? ?Destruction of lesion - R ant distal deltoid x 1, L thigh x 1 ?Complexity: simple   ?Destruction method: cryotherapy   ?Informed consent: discussed and consent obtained   ?Timeout:  patient name, date of birth, surgical site, and procedure verified ?Lesion destroyed using liquid nitrogen: Yes   ?Region frozen until ice ball extended beyond lesion: Yes   ?Outcome: patient tolerated procedure well with no complications   ?Post-procedure details: wound care instructions given   ? ?Atopic dermatitis/psoriasis overlap of the hands ?bil hands ?Cont Eucrisa oint qd to hands ?Atopic dermatitis (eczema) is a chronic, relapsing, pruritic condition that can significantly affect quality of life. It is often associated with allergic rhinitis and/or asthma and can require treatment with topical medications, phototherapy, or in severe cases biologic injectable medication (Dupixent; Adbry) or Oral JAK inhibitors. ? ?Elastosis of skin ?face ?At next Botox injection visit will plan to increase the R sup lat forehead  injection from 1.25 to 2.5 units ? ?Will plan to increase bil lat corrugators from 2.5 units to 5 units on each side. ? ?Return in about 6 months (around 01/04/2022) for Psoriasis f/u. ? ?I, Othelia Pulling, RMA, am acting as scribe for Sarina Ser, MD . ?Documentation: I have reviewed the above documentation for accuracy and completeness,  and I agree with the above. ? ?Sarina Ser, MD ? ?

## 2021-07-04 NOTE — Patient Instructions (Addendum)

## 2021-07-05 ENCOUNTER — Ambulatory Visit: Payer: Medicare PPO | Admitting: Internal Medicine

## 2021-07-05 ENCOUNTER — Encounter: Payer: Self-pay | Admitting: Dermatology

## 2021-07-05 ENCOUNTER — Encounter: Payer: Self-pay | Admitting: Internal Medicine

## 2021-07-05 ENCOUNTER — Ambulatory Visit (INDEPENDENT_AMBULATORY_CARE_PROVIDER_SITE_OTHER): Payer: Medicare PPO

## 2021-07-05 VITALS — Ht 61.0 in | Wt 138.0 lb

## 2021-07-05 DIAGNOSIS — K219 Gastro-esophageal reflux disease without esophagitis: Secondary | ICD-10-CM | POA: Diagnosis not present

## 2021-07-05 DIAGNOSIS — R87622 Low grade squamous intraepithelial lesion on cytologic smear of vagina (LGSIL): Secondary | ICD-10-CM

## 2021-07-05 DIAGNOSIS — R739 Hyperglycemia, unspecified: Secondary | ICD-10-CM | POA: Diagnosis not present

## 2021-07-05 DIAGNOSIS — L4050 Arthropathic psoriasis, unspecified: Secondary | ICD-10-CM | POA: Diagnosis not present

## 2021-07-05 DIAGNOSIS — Z0000 Encounter for general adult medical examination without abnormal findings: Secondary | ICD-10-CM

## 2021-07-05 DIAGNOSIS — F439 Reaction to severe stress, unspecified: Secondary | ICD-10-CM

## 2021-07-05 DIAGNOSIS — I8390 Asymptomatic varicose veins of unspecified lower extremity: Secondary | ICD-10-CM | POA: Diagnosis not present

## 2021-07-05 DIAGNOSIS — M25551 Pain in right hip: Secondary | ICD-10-CM | POA: Diagnosis not present

## 2021-07-05 NOTE — Patient Instructions (Addendum)
?  April Santos , ?Thank you for taking time to come for your Medicare Wellness Visit. I appreciate your ongoing commitment to your health goals. Please review the following plan we discussed and let me know if I can assist you in the future.  ? ?These are the goals we discussed: ? Goals   ? ?  ? Patient Stated  ?   Increase physical activity (pt-stated)   ?   Walk more for exercise up to 4-5 days weekly ?  ? ?  ?  ?This is a list of the screening recommended for you and due dates:  ?Health Maintenance  ?Topic Date Due  ? Hepatitis C Screening: USPSTF Recommendation to screen - Ages 67-79 yo.  Never done  ? Tetanus Vaccine  07/06/2022*  ? Mammogram  12/20/2021  ? Colon Cancer Screening  06/11/2026  ? Pneumonia Vaccine  Completed  ? Flu Shot  Completed  ? DEXA scan (bone density measurement)  Completed  ? COVID-19 Vaccine  Completed  ? Zoster (Shingles) Vaccine  Completed  ? HPV Vaccine  Aged Out  ?*Topic was postponed. The date shown is not the original due date.  ?  ?End of life planning; Advance aging; Advanced directives discussed.  Copy of current HCPOA/Living Will requested.   ? ?  ?

## 2021-07-05 NOTE — Progress Notes (Signed)
Patient ID: April Santos, female   DOB: [birth year], 66 y.o.   MRN: [id number] ? ? ?Subjective:  ? ? Patient ID: April Santos, female    DOB: 1955/06/16, 66 y.o.   MRN: [id number] ? ?This visit occurred during the SARS-CoV-2 public health emergency.  Safety protocols were in place, including screening questions prior to the visit, additional usage of staff PPE, and extensive cleaning of exam room while observing appropriate contact time as indicated for disinfecting solutions.  ? ?Patient here for a scheduled follow up.  ? ?Chief Complaint  ?Patient presents with  ? Follow-up  ? .  ? ?HPI ?Here to follow up regarding cholesterol and right hip pain.  Tries to stay active.  No chest pain or sob reported.  No abdominal pain or cramping reported.  Bowels stable.  Recently saw gyn 04/2021. PAP - ASCUS with negative HPV.  Planning for f/u pap in one year.  Right hip pain - better when walking.  Worse lying down.  Previous L5-S1 injection.  Currently taking gabapentin.  Sees ortho.  Discussed f/u.  She is also concerned regarding varicose veins/discomfort.  Request referral to vascular.   ? ? ?Past Medical History:  ?Diagnosis Date  ? Arthritis   ? Colon polyps   ? COVID-19 2022  ? Disc disorder   ? Dysphagia   ? Family history of adverse reaction to anesthesia   ? mother nausea  ? GERD (gastroesophageal reflux disease)   ? History of uterine fibroid   ? Psoriasis   ? ?Past Surgical History:  ?Procedure Laterality Date  ? ABDOMINAL HYSTERECTOMY    ? BREAST SURGERY  1999  ? biopsy  ? carpal  2005  ? Tunnel right hand  ? CARPOMETACARPAL ([facility]) FUSION OF THUMB Right 11/16/2020  ? Procedure: Right CARPOMETACARPAL [facility]) ARTHROPLASTY;  Surgeon: Hessie Knows, MD;  Location: [facility] ORS;  Service: Orthopedics;  Laterality: Right;  ? COLONOSCOPY WITH ESOPHAGOGASTRODUODENOSCOPY (EGD)    ? COLONOSCOPY WITH PROPOFOL N/A 06/10/2016  ? Procedure: COLONOSCOPY WITH PROPOFOL;  Surgeon: Manya Silvas, MD;  Location: [facility] ENDOSCOPY;  Service:  Endoscopy;  Laterality: N/A;  ? ESOPHAGOGASTRODUODENOSCOPY (EGD) WITH PROPOFOL N/A 06/10/2016  ? Procedure: ESOPHAGOGASTRODUODENOSCOPY (EGD) WITH PROPOFOL;  Surgeon: Manya Silvas, MD;  Location: [facility] ENDOSCOPY;  Service: Endoscopy;  Laterality: N/A;  ? FOOT SURGERY  02/07/1003  ? right foot benign tumor  ? SACROILIAC JOINT INJECTION  08/17/13 & 04/13/14  ? Dr. Sharlet Salina  ? SIGMOIDOSCOPY    ? [place]  ? ?Family History  ?Problem Relation Age of Onset  ? Breast cancer Mother   ?     44/74  ? Heart disease Father   ? Arthritis Father   ? GER disease Father   ? Diabetes Brother   ? Diabetes Maternal Uncle   ? Colon cancer Neg Hx   ? ?Social History  ? ?Socioeconomic History  ? Marital status: Significant Other  ?  Spouse name: Bryson Corona  ? Number of children: 0  ? Years of education: Not on file  ? Highest education level: Not on file  ?Occupational History  ? Not on file  ?Tobacco Use  ? Smoking status: Never  ? Smokeless tobacco: Never  ?Vaping Use  ? Vaping Use: Never used  ?Substance and Sexual Activity  ? Alcohol use: Yes  ?  Alcohol/week: 1.0 standard drink  ?  Types: 1 Glasses of wine per week  ?  Comment: bimonthly  ? Drug  use: No  ? Sexual activity: Yes  ?  Birth control/protection: Surgical  ?  Comment: Hysterectomy  ?Other Topics Concern  ? Not on file  ?Social History Narrative  ? Bryson Corona - POA  ? ?Social Determinants of Health  ? ?Financial Resource Strain: Low Risk   ? Difficulty of Paying Living Expenses: Not hard at all  ?Food Insecurity: No Food Insecurity  ? Worried About Charity fundraiser in the Last Year: Never true  ? Ran Out of Food in the Last Year: Never true  ?Transportation Needs: No Transportation Needs  ? Lack of Transportation (Medical): No  ? Lack of Transportation (Non-Medical): No  ?Physical Activity: Sufficiently Active  ? Days of Exercise per Week: 3 days  ? Minutes of Exercise per Session: 60 min  ?Stress: No Stress Concern Present  ? Feeling of Stress :  Not at all  ?Social Connections: Unknown  ? Frequency of Communication with Friends and Family: Not on file  ? Frequency of Social Gatherings with Friends and Family: Not on file  ? Attends Religious Services: Not on file  ? Active Member of Clubs or Organizations: Not on file  ? Attends Archivist Meetings: Not on file  ? Marital Status: Married  ? ? ? ?Review of Systems  ?Constitutional:  Negative for appetite change and unexpected weight change.  ?HENT:  Negative for congestion and sinus pressure.   ?Respiratory:  Negative for cough, chest tightness and shortness of breath.   ?Cardiovascular:  Negative for chest pain, palpitations and leg swelling.  ?Gastrointestinal:  Negative for abdominal pain, diarrhea, nausea and vomiting.  ?Genitourinary:  Negative for difficulty urinating and dysuria.  ?Musculoskeletal:  Negative for joint swelling and myalgias.  ?     Right hip pain.   ?Skin:  Negative for color change and rash.  ?Neurological:  Negative for dizziness, light-headedness and headaches.  ?Psychiatric/Behavioral:  Negative for agitation and dysphoric mood.   ? ?   ?Objective:  ?  ? ?BP 120/74 (BP Location: Left Arm, Patient Position: Sitting, Cuff Size: Small)   Pulse 67   Temp 97.9 ?F (36.6 ?C) (Oral)   Ht '5\' 1"'$  (1.549 m)   Wt 142 lb (64.4 kg)   LMP 02/26/2002   SpO2 97%   BMI 26.83 kg/m?  ?Wt Readings from Last 3 Encounters:  ?07/05/21 142 lb (64.4 kg)  ?07/05/21 138 lb (62.6 kg)  ?04/19/21 138 lb (62.6 kg)  ? ? ?Physical Exam ?Vitals reviewed.  ?Constitutional:   ?   General: She is not in acute distress. ?   Appearance: Normal appearance.  ?HENT:  ?   Head: Normocephalic and atraumatic.  ?   Right Ear: External ear normal.  ?   Left Ear: External ear normal.  ?Eyes:  ?   General: No scleral icterus.    ?   Right eye: No discharge.     ?   Left eye: No discharge.  ?   Conjunctiva/sclera: Conjunctivae normal.  ?Neck:  ?   Thyroid: No thyromegaly.  ?Cardiovascular:  ?   Rate and Rhythm:  Normal rate and regular rhythm.  ?Pulmonary:  ?   Effort: No respiratory distress.  ?   Breath sounds: Normal breath sounds. No wheezing.  ?Abdominal:  ?   General: Bowel sounds are normal.  ?   Palpations: Abdomen is soft.  ?   Tenderness: There is no abdominal tenderness.  ?Musculoskeletal:     ?   General: No swelling  or tenderness.  ?   Cervical back: Neck supple. No tenderness.  ?Lymphadenopathy:  ?   Cervical: No cervical adenopathy.  ?Skin: ?   Findings: No erythema or rash.  ?Neurological:  ?   Mental Status: She is alert.  ?Psychiatric:     ?   Mood and Affect: Mood normal.     ?   Behavior: Behavior normal.  ? ? ? ?Outpatient Encounter Medications as of 07/05/2021  ?Medication Sig  ? ALPRAZolam (XANAX) 0.25 MG tablet Take 1 tablet (0.25 mg total) by mouth at bedtime as needed.  ? Apremilast (OTEZLA) 30 MG TABS Take 30 mg by mouth daily.  ? B Complex-C (B-COMPLEX WITH VITAMIN C) tablet Take 1 tablet by mouth daily.  ? cholecalciferol (VITAMIN D3) 25 MCG (1000 UNIT) tablet Take 1,000 Units by mouth daily.  ? Crisaborole (EUCRISA) 2 % OINT Apply 1 application topically 2 (two) times daily as needed (psoriasis).  ? desonide (DESOWEN) 0.05 % cream Apply 1 application topically 2 (two) times daily as needed (psoriasis).  ? estradiol (ESTRACE) 0.1 MG/GM vaginal cream Insert 1 gram vaginally 1-2 times weekly  ? estradiol (VIVELLE-DOT) 0.025 MG/24HR Place 1 patch onto the skin 2 (two) times a week.  ? gabapentin (NEURONTIN) 300 MG capsule Take 300 mg by mouth at bedtime.  ? RABEprazole (ACIPHEX) 20 MG tablet TAKE ONE TABLET BY MOUTH EVERY DAY  ? Tapinarof (VTAMA) 1 % CREA Apply 1 application. topically daily. Qd to aa psoriasis  ? ?No facility-administered encounter medications on file as of 07/05/2021.  ?  ? ?Lab Results  ?Component Value Date  ? WBC 4.3 07/03/2021  ? HGB 13.3 07/03/2021  ? HCT 40.3 07/03/2021  ? PLT 270.0 07/03/2021  ? GLUCOSE 94 07/03/2021  ? CHOL 182 07/03/2021  ? TRIG 103.0 07/03/2021  ? HDL  62.50 07/03/2021  ? [place] 99 07/03/2021  ? ALT 16 07/03/2021  ? AST 19 07/03/2021  ? NA 139 07/03/2021  ? K 3.9 07/03/2021  ? CL 105 07/03/2021  ? CREATININE 0.80 07/03/2021  ? BUN 19 07/03/2021  ? CO2 27 07/04/18

## 2021-07-05 NOTE — Progress Notes (Signed)
Subjective:   April Santos is a 66 y.o. female who presents for Medicare Annual (Subsequent) preventive examination.  Review of Systems    No ROS.  Medicare Wellness Virtual Visit.  Visual/audio telehealth visit, UTA vital signs.   See social history for additional risk factors.   Cardiac Risk Factors include: advanced age (>50men, >9 women)     Objective:    Today's Vitals   07/05/21 1235  Weight: 138 lb (62.6 kg)  Height: 5\' 1"  (1.549 m)   Body mass index is 26.07 kg/m.     07/05/2021   12:37 PM 11/16/2020   10:55 AM 11/09/2020    8:54 AM 07/04/2020   12:47 PM 06/10/2016   10:32 AM  Advanced Directives  Does Patient Have a Medical Advance Directive? Yes Yes Yes Yes No  Type of Estate agent of [place];Living will Healthcare Power of [employer] Power of [employer] Power of [place];Living will   Does patient want to make changes to medical advance directive? No - Patient declined No - Patient declined  No - Patient declined   Copy of Healthcare Power of Attorney in Chart? No - copy requested No - copy requested  No - copy requested     Current Medications (verified) Outpatient Encounter Medications as of 07/05/2021  Medication Sig   ALPRAZolam (XANAX) 0.25 MG tablet Take 1 tablet (0.25 mg total) by mouth at bedtime as needed.   Apremilast (OTEZLA) 30 MG TABS Take 30 mg by mouth daily.   B Complex-C (B-COMPLEX WITH VITAMIN C) tablet Take 1 tablet by mouth daily.   cholecalciferol (VITAMIN D3) 25 MCG (1000 UNIT) tablet Take 1,000 Units by mouth daily.   Crisaborole (EUCRISA) 2 % OINT Apply 1 application topically 2 (two) times daily as needed (psoriasis).   desonide (DESOWEN) 0.05 % cream Apply 1 application topically 2 (two) times daily as needed (psoriasis).   estradiol (ESTRACE) 0.1 MG/GM vaginal cream Insert 1 gram vaginally 1-2 times weekly   estradiol (VIVELLE-DOT) 0.025 MG/24HR Place 1 patch onto the skin 2 (two) times a week.    gabapentin (NEURONTIN) 300 MG capsule Take 300 mg by mouth at bedtime.   RABEprazole (ACIPHEX) 20 MG tablet TAKE ONE TABLET BY MOUTH EVERY DAY   Tapinarof (VTAMA) 1 % CREA Apply 1 application. topically daily. Qd to aa psoriasis   No facility-administered encounter medications on file as of 07/05/2021.    Allergies (verified) Erythromycin ethylsuccinate [erythromycin] and Omnicef [cefdinir]   History: Past Medical History:  Diagnosis Date   Arthritis    Colon polyps    COVID-19 2022   Disc disorder    Dysphagia    Family history of adverse reaction to anesthesia    mother nausea   GERD (gastroesophageal reflux disease)    History of uterine fibroid    Psoriasis    Past Surgical History:  Procedure Laterality Date   ABDOMINAL HYSTERECTOMY     BREAST SURGERY  1999   biopsy   carpal  2005   Tunnel right hand   CARPOMETACARPAL (CMC) FUSION OF THUMB Right 11/16/2020   Procedure: Right CARPOMETACARPAL [facility]) ARTHROPLASTY;  Surgeon: Kennedy Bucker, MD;  Location: [facility] ORS;  Service: Orthopedics;  Laterality: Right;   COLONOSCOPY WITH ESOPHAGOGASTRODUODENOSCOPY (EGD)     COLONOSCOPY WITH PROPOFOL N/A 06/10/2016   Procedure: COLONOSCOPY WITH PROPOFOL;  Surgeon: Scot Jun, MD;  Location: [facility] ENDOSCOPY;  Service: Endoscopy;  Laterality: N/A;   ESOPHAGOGASTRODUODENOSCOPY (EGD) WITH PROPOFOL N/A 06/10/2016  Procedure: ESOPHAGOGASTRODUODENOSCOPY (EGD) WITH PROPOFOL;  Surgeon: Scot Jun, MD;  Location: [facility] ENDOSCOPY;  Service: Endoscopy;  Laterality: N/A;   FOOT SURGERY  02/07/1003   right foot benign tumor   SACROILIAC JOINT INJECTION  08/17/13 & 04/13/14   Dr. Yves Dill   SIGMOIDOSCOPY     UTERINE FIBROID SURGERY  1997   Family History  Problem Relation Age of Onset   Breast cancer Mother        12/74   Heart disease Father    Arthritis Father    GER disease Father    Diabetes Brother    Diabetes Maternal Uncle    Colon cancer Neg Hx    Social History    Socioeconomic History   Marital status: Significant Other    Spouse name: [name]   Number of children: 0   Years of education: Not on file   Highest education level: Not on file  Occupational History   Not on file  Tobacco Use   Smoking status: Never   Smokeless tobacco: Never  Vaping Use   Vaping Use: Never used  Substance and Sexual Activity   Alcohol use: Yes    Alcohol/week: 1.0 standard drink    Types: 1 Glasses of wine per week    Comment: bimonthly   Drug use: No   Sexual activity: Yes    Birth control/protection: Surgical    Comment: Hysterectomy  Other Topics Concern   Not on file  Social History Narrative   [name] - POA   Social Determinants of Health   Financial Resource Strain: Low Risk    Difficulty of Paying Living Expenses: Not hard at all  Food Insecurity: No Food Insecurity   Worried About Programme researcher, broadcasting/film/video in the Last Year: Never true   Ran Out of Food in the Last Year: Never true  Transportation Needs: No Transportation Needs   Lack of Transportation (Medical): No   Lack of Transportation (Non-Medical): No  Physical Activity: Sufficiently Active   Days of Exercise per Week: 3 days   Minutes of Exercise per Session: 60 min  Stress: No Stress Concern Present   Feeling of Stress : Not at all  Social Connections: Unknown   Frequency of Communication with Friends and Family: Not on file   Frequency of Social Gatherings with Friends and Family: Not on file   Attends Religious Services: Not on file   Active Member of Clubs or Organizations: Not on file   Attends Banker Meetings: Not on file   Marital Status: Married    Tobacco Counseling Counseling given: Not Answered   Clinical Intake:  Pre-visit preparation completed: Yes        Diabetes: No  How often do you need to have someone help you when you read instructions, pamphlets, or other written materials from your doctor or pharmacy?: 1 -  Never    Interpreter Needed?: No      Activities of Daily Living    07/05/2021   12:34 PM 11/09/2020    8:56 AM  In your present state of health, do you have any difficulty performing the following activities:  Hearing? 0   Vision? 0   Difficulty concentrating or making decisions? 0   Walking or climbing stairs? 0   Dressing or bathing? 0   Doing errands, shopping? 0 0  Preparing Food and eating ? N   Using the Toilet? N   In the past six months, have you accidently leaked  urine? N   Do you have problems with loss of bowel control? N   Managing your Medications? N   Managing your Finances? N   Housekeeping or managing your Housekeeping? N     Patient Care Team: Dale Refugio, MD as PCP - General (Internal Medicine)  Indicate any recent Medical Services you may have received from other than [facility] providers in the past year (date may be approximate).     Assessment:   This is a routine wellness examination for April Santos.  Virtual Visit via Telephone Note  I connected with  April Santos on 07/05/21 at 12:30 PM EDT by telephone and verified that I am speaking with the correct person using two identifiers.  Persons participating in the virtual visit: patient/Nurse Health Advisor   I discussed the limitations of performing an evaluation and management service by telephone and the availability of in person appointments. The patient expressed understanding and agreed to proceed.  Some vital signs may be absent or patient reported.   Hearing/Vision screen Hearing Screening - Comments:: Patient is able to hear conversational tones without difficulty. No issues reported. Vision Screening - Comments:: Followed by [facility] Wears corrective lenses They have seen their ophthalmologist in the last 12 months.    Dietary issues and exercise activities discussed: Current Exercise Habits: Home exercise routine, Type of exercise: walking, Intensity: Mild Healthy diet Good  water intake   Goals Addressed               This Visit's Progress     Patient Stated     Increase physical activity (pt-stated)        Walk more for exercise up to 4-5 days weekly       Depression Screen    07/05/2021   12:40 PM 11/24/2020    3:09 PM 07/04/2020   12:40 PM 01/28/2020    8:15 AM 12/18/2018    3:19 PM 12/19/2016    4:03 PM 06/08/2013   11:55 AM  PHQ 2/9 Scores  PHQ - 2 Score 0 0 0 0 0 0 0    Fall Risk    07/05/2021   12:39 PM 11/24/2020    3:09 PM 07/04/2020   12:47 PM 01/28/2020    8:15 AM 06/15/2019    8:08 AM  Fall Risk   Falls in the past year? 0 0 0 0 0  Number falls in past yr: 0 0 0 0   Injury with Fall?  0 0 0   Follow up Falls evaluation completed Falls evaluation completed Falls evaluation completed Falls evaluation completed Falls evaluation completed    FALL RISK PREVENTION PERTAINING TO THE HOME:  Home free of loose throw rugs in walkways, pet beds, electrical cords, etc? Yes  Adequate lighting in your home to reduce risk of falls? Yes   ASSISTIVE DEVICES UTILIZED TO PREVENT FALLS:  Life alert? No  Use of a cane, walker or w/c? No  Grab bars in the bathroom? Yes  Shower chair or bench in shower? Yes  Elevated toilet ? Yes   TIMED UP AND GO: Was the test performed? No .   Cognitive Function:  Patient is alert and oriented.  Normal cognitive status assessed by direct communication. 6CIT complete.         07/05/2021   12:46 PM  6CIT Screen  What Year? 0 points  What month? 0 points  What time? 0 points  Count back from 20 0 points  Months in reverse  0 points    Immunizations Immunization History  Administered Date(s) Administered   Fluad Quad(high Dose 65+) 01/07/2020, 12/28/2020   Influenza Split 04/28/2012   Influenza,inj,Quad PF,6+ Mos 02/02/2013, 01/12/2014, 01/04/2015, 02/05/2016, 01/06/2017, 01/19/2018, 12/18/2018   Moderna Covid-19 Vaccine Bivalent Booster 53yrs & up 01/19/2021   Moderna Sars-Covid-2 Vaccination  06/02/2019, 06/30/2019, 02/18/2020   PNEUMOCOCCAL CONJUGATE-20 08/21/2020   Zoster Recombinat (Shingrix) 12/28/2016, 05/31/2017    TDAP status: Due, Education has been provided regarding the importance of this vaccine. Advised may receive this vaccine at local pharmacy or Health Dept. Aware to provide a copy of the vaccination record if obtained from local pharmacy or Health Dept. Verbalized acceptance and understanding. Deferred.   Screening Tests Health Maintenance  Topic Date Due   Hepatitis C Screening  Never done   TETANUS/TDAP  07/06/2022 (Originally 06/05/1974)   MAMMOGRAM  12/20/2021   COLONOSCOPY (Pts 45-14yrs Insurance coverage will need to be confirmed)  06/11/2026   Pneumonia Vaccine 27+ Years old  Completed   INFLUENZA VACCINE  Completed   DEXA SCAN  Completed   COVID-19 Vaccine  Completed   Zoster Vaccines- Shingrix  Completed   HPV VACCINES  Aged Out   Health Maintenance Health Maintenance Due  Topic Date Due   Hepatitis C Screening  Never done   Lung Cancer Screening: (Low Dose CT Chest recommended if Age 77-80 years, 30 pack-year currently smoking OR have quit w/in 15years.) does not qualify.   Hepatitis C Screening: deferred for further discussion with PCP per patient preference.   Vision Screening: Recommended annual ophthalmology exams for early detection of glaucoma and other disorders of the eye.  Dental Screening: Recommended annual dental exams for proper oral hygiene. Every 6 months.   Community Resource Referral / Chronic Care Management: CRR required this visit?  No   CCM required this visit?  No      Plan:   Keep all routine maintenance appointments.   I have personally reviewed and noted the following in the patient's chart:   Medical and social history Use of alcohol, tobacco or illicit drugs  Current medications and supplements including opioid prescriptions.  Functional ability and status Nutritional status Physical activity Advanced  directives List of other physicians Hospitalizations, surgeries, and ER visits in previous 12 months Vitals Screenings to include cognitive, depression, and falls Referrals and appointments  In addition, I have reviewed and discussed with patient certain preventive protocols, quality metrics, and best practice recommendations. A written personalized care plan for preventive services as well as general preventive health recommendations were provided to patient.     Ashok Pall, LPN   1/61/0960

## 2021-07-07 ENCOUNTER — Encounter: Payer: Self-pay | Admitting: Internal Medicine

## 2021-07-07 DIAGNOSIS — I8390 Asymptomatic varicose veins of unspecified lower extremity: Secondary | ICD-10-CM | POA: Insufficient documentation

## 2021-07-07 DIAGNOSIS — M25551 Pain in right hip: Secondary | ICD-10-CM | POA: Insufficient documentation

## 2021-07-07 DIAGNOSIS — I83893 Varicose veins of bilateral lower extremities with other complications: Secondary | ICD-10-CM | POA: Insufficient documentation

## 2021-07-07 NOTE — Assessment & Plan Note (Signed)
Followed by dermatology.  On otezla.   

## 2021-07-07 NOTE — Assessment & Plan Note (Signed)
Just saw gyn 04/2021.  Repeat pap ASCUS with negative HPV.  Repeat pap in one year.  ?

## 2021-07-07 NOTE — Assessment & Plan Note (Signed)
Low carb diet and exercise.  Follow met b and a1c.  ?

## 2021-07-07 NOTE — Assessment & Plan Note (Signed)
Varicose veins - discomfort.  Refer to [facility] for further evaluation.  ?

## 2021-07-07 NOTE — Assessment & Plan Note (Signed)
Saw Dr Marius Ditch.  No increased acid reflux reported.  Follow.  ?

## 2021-07-07 NOTE — Assessment & Plan Note (Signed)
Overall appears to be handling things relatively well. 

## 2021-07-07 NOTE — Assessment & Plan Note (Signed)
F/u with ortho as outlined.  ?

## 2021-07-13 ENCOUNTER — Encounter: Payer: Self-pay | Admitting: Internal Medicine

## 2021-07-24 DIAGNOSIS — M5126 Other intervertebral disc displacement, lumbar region: Secondary | ICD-10-CM | POA: Diagnosis not present

## 2021-07-24 DIAGNOSIS — M5416 Radiculopathy, lumbar region: Secondary | ICD-10-CM | POA: Diagnosis not present

## 2021-07-25 ENCOUNTER — Encounter: Payer: Self-pay | Admitting: Internal Medicine

## 2021-07-26 ENCOUNTER — Encounter: Payer: Self-pay | Admitting: Internal Medicine

## 2021-07-26 NOTE — Telephone Encounter (Signed)
See other my chart message.  Pt called back and informed appt has been scheduled.   ?

## 2021-08-20 ENCOUNTER — Encounter (INDEPENDENT_AMBULATORY_CARE_PROVIDER_SITE_OTHER): Payer: Medicare PPO | Admitting: Vascular Surgery

## 2021-08-21 ENCOUNTER — Encounter (INDEPENDENT_AMBULATORY_CARE_PROVIDER_SITE_OTHER): Payer: Self-pay | Admitting: Vascular Surgery

## 2021-08-21 ENCOUNTER — Ambulatory Visit (INDEPENDENT_AMBULATORY_CARE_PROVIDER_SITE_OTHER): Payer: Medicare PPO | Admitting: Vascular Surgery

## 2021-08-21 VITALS — BP 146/99 | HR 72 | Resp 17 | Ht 61.0 in | Wt 143.0 lb

## 2021-08-21 DIAGNOSIS — K219 Gastro-esophageal reflux disease without esophagitis: Secondary | ICD-10-CM

## 2021-08-21 DIAGNOSIS — M5136 Other intervertebral disc degeneration, lumbar region: Secondary | ICD-10-CM | POA: Diagnosis not present

## 2021-08-21 DIAGNOSIS — M5416 Radiculopathy, lumbar region: Secondary | ICD-10-CM

## 2021-08-21 DIAGNOSIS — M65332 Trigger finger, left middle finger: Secondary | ICD-10-CM | POA: Diagnosis not present

## 2021-08-21 DIAGNOSIS — I83893 Varicose veins of bilateral lower extremities with other complications: Secondary | ICD-10-CM

## 2021-08-21 DIAGNOSIS — M4316 Spondylolisthesis, lumbar region: Secondary | ICD-10-CM | POA: Diagnosis not present

## 2021-08-21 NOTE — Progress Notes (Signed)
? ? ?Patient ID: April Santos, female   DOB: Mar 25, 1956, 66 y.o.   MRN: [id number] ? ?Chief Complaint  ?Patient presents with  ? Establish Care  ?  Referred by Dr Nicki Reaper  ? ? ?HPI ?April Santos is a 66 y.o. female.  I am asked to see the patient by Dr. Nicki Reaper for evaluation of varicose veins of both lower extremities with worsening pain and noticeable swelling.  The patient presents with complaints of symptomatic varicosities of the legs, right greater than left. The patient reports a long standing history of varicosities and they have become painful over time. There was no clear inciting event or causative factor that started the symptoms.  The right leg is more severly affected. The patient elevates the legs for relief. The pain is described as stinging and burning over the varicosities as well as heaviness in her leg. The symptoms are generally most severe in the evening, particularly when they have been on their feet for long periods of time.  Extensive walking as well as compression socks has been used to try to improve the symptoms with some success. The patient complains of worsening swelling as an associated symptom. The patient has no previous history of deep venous thrombosis or superficial thrombophlebitis to their knowledge. ? ? ? ? ?Past Medical History:  ?Diagnosis Date  ? Arthritis   ? Colon polyps   ? COVID-19 2022  ? Disc disorder   ? Dysphagia   ? Family history of adverse reaction to anesthesia   ? mother nausea  ? GERD (gastroesophageal reflux disease)   ? History of uterine fibroid   ? Psoriasis   ? ? ?Past Surgical History:  ?Procedure Laterality Date  ? ABDOMINAL HYSTERECTOMY    ? BREAST SURGERY  1999  ? biopsy  ? carpal  2005  ? Tunnel right hand  ? CARPOMETACARPAL (CMC) FUSION OF THUMB Right 11/16/2020  ? Procedure: Right CARPOMETACARPAL [facility]) ARTHROPLASTY;  Surgeon: Hessie Knows, MD;  Location: [facility] ORS;  Service: Orthopedics;  Laterality: Right;  ? COLONOSCOPY WITH  ESOPHAGOGASTRODUODENOSCOPY (EGD)    ? COLONOSCOPY WITH PROPOFOL N/A 06/10/2016  ? Procedure: COLONOSCOPY WITH PROPOFOL;  Surgeon: Manya Silvas, MD;  Location: [facility] ENDOSCOPY;  Service: Endoscopy;  Laterality: N/A;  ? ESOPHAGOGASTRODUODENOSCOPY (EGD) WITH PROPOFOL N/A 06/10/2016  ? Procedure: ESOPHAGOGASTRODUODENOSCOPY (EGD) WITH PROPOFOL;  Surgeon: Manya Silvas, MD;  Location: [facility] ENDOSCOPY;  Service: Endoscopy;  Laterality: N/A;  ? FOOT SURGERY  02/07/1003  ? right foot benign tumor  ? SACROILIAC JOINT INJECTION  08/17/13 & 04/13/14  ? Dr. Sharlet Salina  ? SIGMOIDOSCOPY    ? [place]  ? ? ?Family History  ?Problem Relation Age of Onset  ? Breast cancer Mother   ?     44/74  ? Heart disease Father   ? Arthritis Father   ? GER disease Father   ? Diabetes Brother   ? Diabetes Maternal Uncle   ? Colon cancer Neg Hx   ? ? ? ?Social History  ? ?Tobacco Use  ? Smoking status: Never  ? Smokeless tobacco: Never  ?Vaping Use  ? Vaping Use: Never used  ?Substance Use Topics  ? Alcohol use: Yes  ?  Alcohol/week: 1.0 standard drink  ?  Types: 1 Glasses of wine per week  ?  Comment: bimonthly  ? Drug use: No  ? ? ?Allergies  ?Allergen Reactions  ? Erythromycin Ethylsuccinate [Erythromycin] Other (See Comments)  ?  Unknown  ?  Omnicef [Cefdinir] Hives and Itching  ? ? ?Current Outpatient Medications  ?Medication Sig Dispense Refill  ? ALPRAZolam (XANAX) 0.25 MG tablet Take 1 tablet (0.25 mg total) by mouth at bedtime as needed. 30 tablet 0  ? Apremilast (OTEZLA) 30 MG TABS Take 30 mg by mouth daily.    ? B Complex-C (B-COMPLEX WITH VITAMIN C) tablet Take 1 tablet by mouth daily.    ? cholecalciferol (VITAMIN D3) 25 MCG (1000 UNIT) tablet Take 1,000 Units by mouth daily.    ? estradiol (ESTRACE) 0.1 MG/GM vaginal cream Insert 1 gram vaginally 1-2 times weekly 42.5 g 2  ? estradiol (VIVELLE-DOT) 0.025 MG/24HR Place 1 patch onto the skin 2 (two) times a week. 24 patch 0  ? gabapentin (NEURONTIN) 300 MG capsule Take  300 mg by mouth at bedtime.    ? RABEprazole (ACIPHEX) 20 MG tablet TAKE ONE TABLET BY MOUTH EVERY DAY 90 tablet 3  ? Crisaborole (EUCRISA) 2 % OINT Apply 1 application topically 2 (two) times daily as needed (psoriasis). (Patient not taking: Reported on 08/21/2021) 60 g 1  ? desonide (DESOWEN) 0.05 % cream Apply 1 application topically 2 (two) times daily as needed (psoriasis). (Patient not taking: Reported on 08/21/2021)    ? Tapinarof (VTAMA) 1 % CREA Apply 1 application. topically daily. Qd to aa psoriasis (Patient not taking: Reported on 08/21/2021) 60 g 6  ? ?No current facility-administered medications for this visit.  ? ? ? ? ?REVIEW OF SYSTEMS (Negative unless checked) ? ?Constitutional: '[]'$ Weight loss  '[]'$ Fever  '[]'$ Chills ?Cardiac: '[]'$ Chest pain   '[]'$ Chest pressure   '[]'$ Palpitations   '[]'$ Shortness of breath when laying flat   '[]'$ Shortness of breath at rest   '[]'$ Shortness of breath with exertion. ?Vascular:  '[]'$ Pain in legs with walking   '[]'$ Pain in legs at rest   '[]'$ Pain in legs when laying flat   '[]'$ Claudication   '[]'$ Pain in feet when walking  '[]'$ Pain in feet at rest  '[]'$ Pain in feet when laying flat   '[]'$ History of DVT   '[]'$ Phlebitis   '[x]'$ Swelling in legs   '[x]'$ Varicose veins   '[]'$ Non-healing ulcers ?Pulmonary:   '[]'$ Uses home oxygen   '[]'$ Productive cough   '[]'$ Hemoptysis   '[]'$ Wheeze  '[]'$ COPD   '[]'$ Asthma ?Neurologic:  '[]'$ Dizziness  '[]'$ Blackouts   '[]'$ Seizures   '[]'$ History of stroke   '[]'$ History of TIA  '[]'$ Aphasia   '[]'$ Temporary blindness   '[]'$ Dysphagia   '[]'$ Weakness or numbness in arms   '[]'$ Weakness or numbness in legs ?Musculoskeletal:  '[x]'$ Arthritis   '[]'$ Joint swelling   '[]'$ Joint pain   '[]'$ Low back pain ?Hematologic:  '[]'$ Easy bruising  '[]'$ Easy bleeding   '[]'$ Hypercoagulable state   '[x]'$ Anemic  '[]'$ Hepatitis ?Gastrointestinal:  '[]'$ Blood in stool   '[]'$ Vomiting blood  '[x]'$ Gastroesophageal reflux/heartburn   '[]'$ Abdominal pain ?Genitourinary:  '[]'$ Chronic kidney disease   '[]'$ Difficult urination  '[]'$ Frequent urination  '[]'$ Burning with urination   '[]'$ Hematuria ?Skin:   '[]'$ Rashes   '[]'$ Ulcers   '[]'$ Wounds ?Psychological:  '[]'$ History of anxiety   '[]'$  History of major depression. ? ? ? ?Physical Exam ?BP (!) 146/99 (BP Location: Right Arm)   Pulse 72   Resp 17   Ht '5\' 1"'$  (1.549 m)   Wt 143 lb (64.9 kg)   LMP 02/26/2002   BMI 27.02 kg/m?  ?Gen:  WD/WN, NAD.  Appears younger than stated age ?Head: [place]/AT, No temporalis wasting.  ?Ear/Nose/Throat: Hearing grossly intact, dentition good ?Eyes: Sclera non-icteric. Conjunctiva clear ?Neck: Supple. Trachea midline ?Pulmonary:  Good air movement, no use of accessory muscles,  respirations not labored.  ?Cardiac: RRR, No JVD ?Vascular: Varicosities diffuse and measuring up to 1-2 mm in the right lower extremity ?       Varicosities scattered and measuring up to 1-2 mm in the left lower extremity ?Vessel Right Left  ?Radial Palpable Palpable  ?    ?    ?    ?    ?    ?    ?PT Palpable Palpable  ?DP Palpable Palpable  ? ?Gastrointestinal: soft, non-tender/non-distended.  ?Musculoskeletal: M/S 5/5 throughout.  Trace bilateral LE edema ?Neurologic: Sensation grossly intact in extremities.  Symmetrical.  Speech is fluent.  ?Psychiatric: Judgment intact, Mood & affect appropriate for pt's clinical situation. ?Dermatologic: No rashes or ulcers noted.  No cellulitis or open wounds. ? ? ? ?Radiology ?No results found. ? ?Labs ?Recent Results (from the past 2160 hour(s))  ?CBC with Differential/Platelet     Status: None  ? Collection Time: 07/03/21  8:05 AM  ?Result Value Ref Range  ? WBC 4.3 4.0 - 10.5 K/uL  ? RBC 4.63 3.87 - 5.11 Mil/uL  ? Hemoglobin 13.3 12.0 - 15.0 g/dL  ? HCT 40.3 36.0 - 46.0 %  ? MCV 87.0 78.0 - 100.0 fl  ? MCHC 32.9 30.0 - 36.0 g/dL  ? RDW 13.5 11.5 - 15.5 %  ? Platelets 270.0 150.0 - 400.0 K/uL  ? Neutrophils Relative % 49.9 43.0 - 77.0 %  ? Lymphocytes Relative 38.2 12.0 - 46.0 %  ? Monocytes Relative 9.7 3.0 - 12.0 %  ? Eosinophils Relative 1.6 0.0 - 5.0 %  ? Basophils Relative 0.6 0.0 - 3.0 %  ? Neutro Abs 2.1 1.4 - 7.7 K/uL  ?  Lymphs Abs 1.6 0.7 - 4.0 K/uL  ? Monocytes Absolute 0.4 0.1 - 1.0 K/uL  ? Eosinophils Absolute 0.1 0.0 - 0.7 K/uL  ? Basophils Absolute 0.0 0.0 - 0.1 K/uL  ?Lipid panel     Status: None  ? Collection Time: 07/03/21  8:05 AM  ?Res

## 2021-08-21 NOTE — Assessment & Plan Note (Signed)
Significant portion of the lower extremity pain is likely from lumbar radiculitis.  She understands this but also is having more pain in the lower leg that is not typical neuropathic pain ?

## 2021-08-21 NOTE — Assessment & Plan Note (Signed)
Continue PPI as already ordered, this medication has been reviewed and there are no changes at this time. Avoidence of caffeine and alcohol Moderate elevation of the head of the bed  

## 2021-08-21 NOTE — Patient Instructions (Signed)

## 2021-08-24 IMAGING — DX DG WRIST 2V*R*
2 series · 2 of 2 positions shown · non-contrast
Comparison: 03/11/2016

CLINICAL DATA: Post arthroplasty of finger of RIGHT hand

EXAM:
RIGHT WRIST - 2 VIEW

[wrist ap]
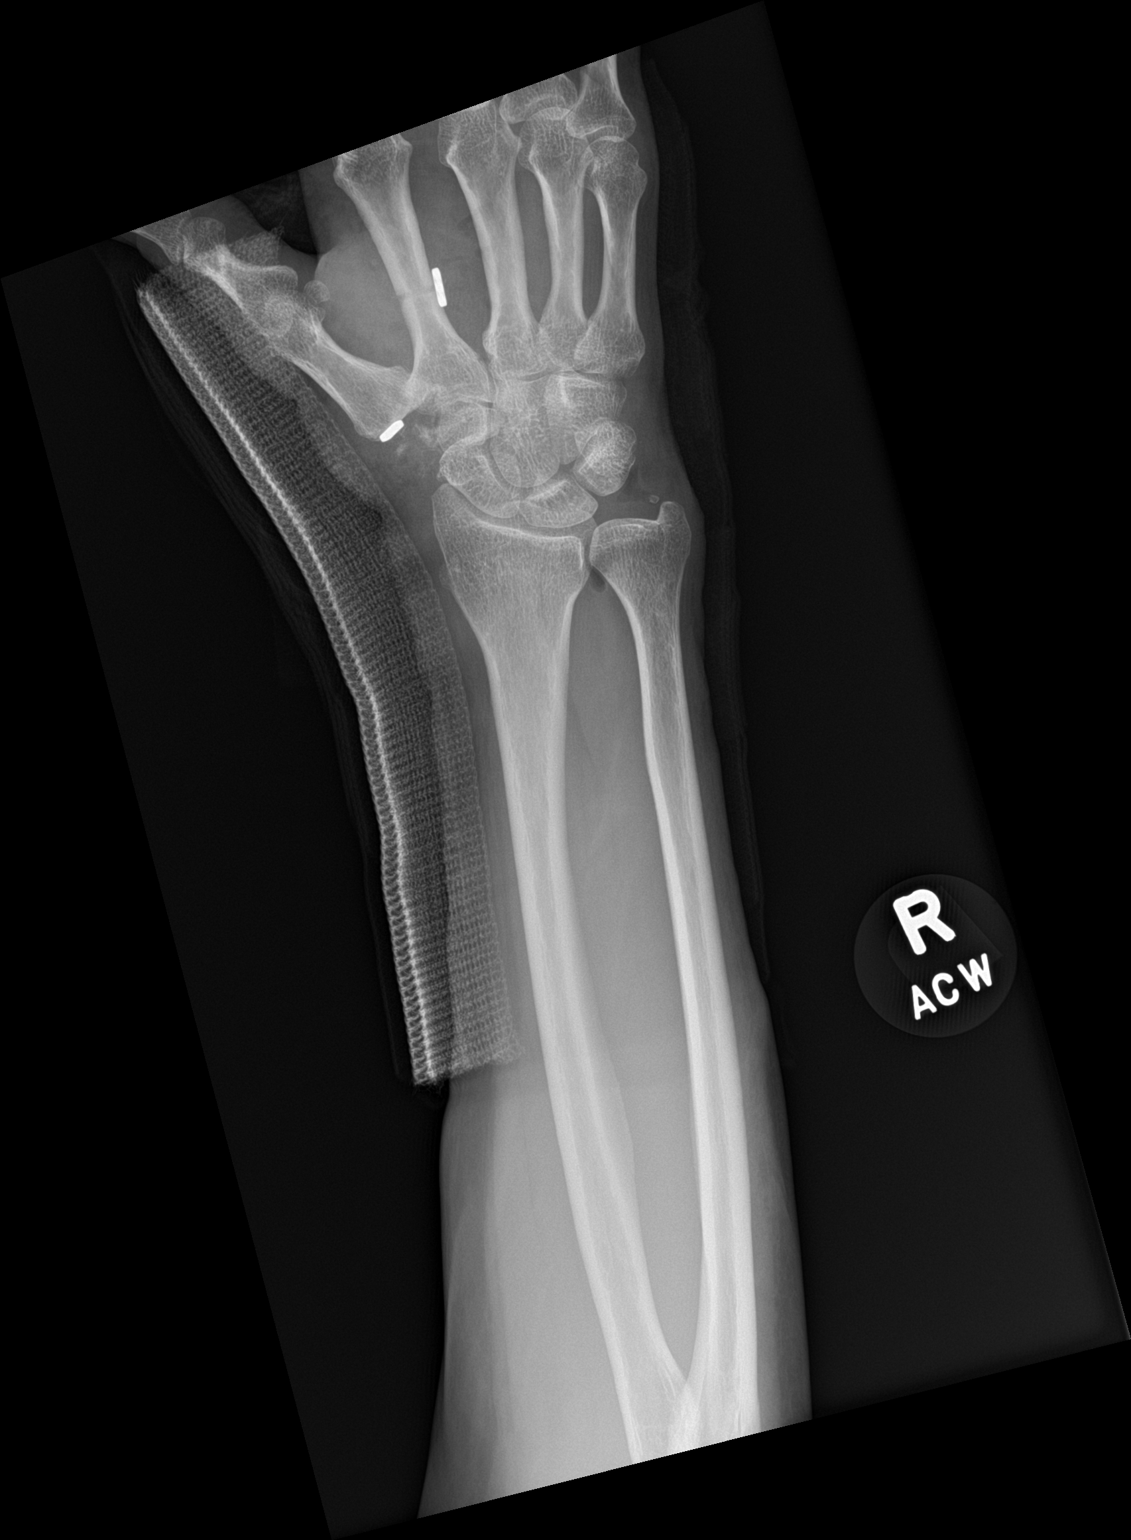

[wrist lat]
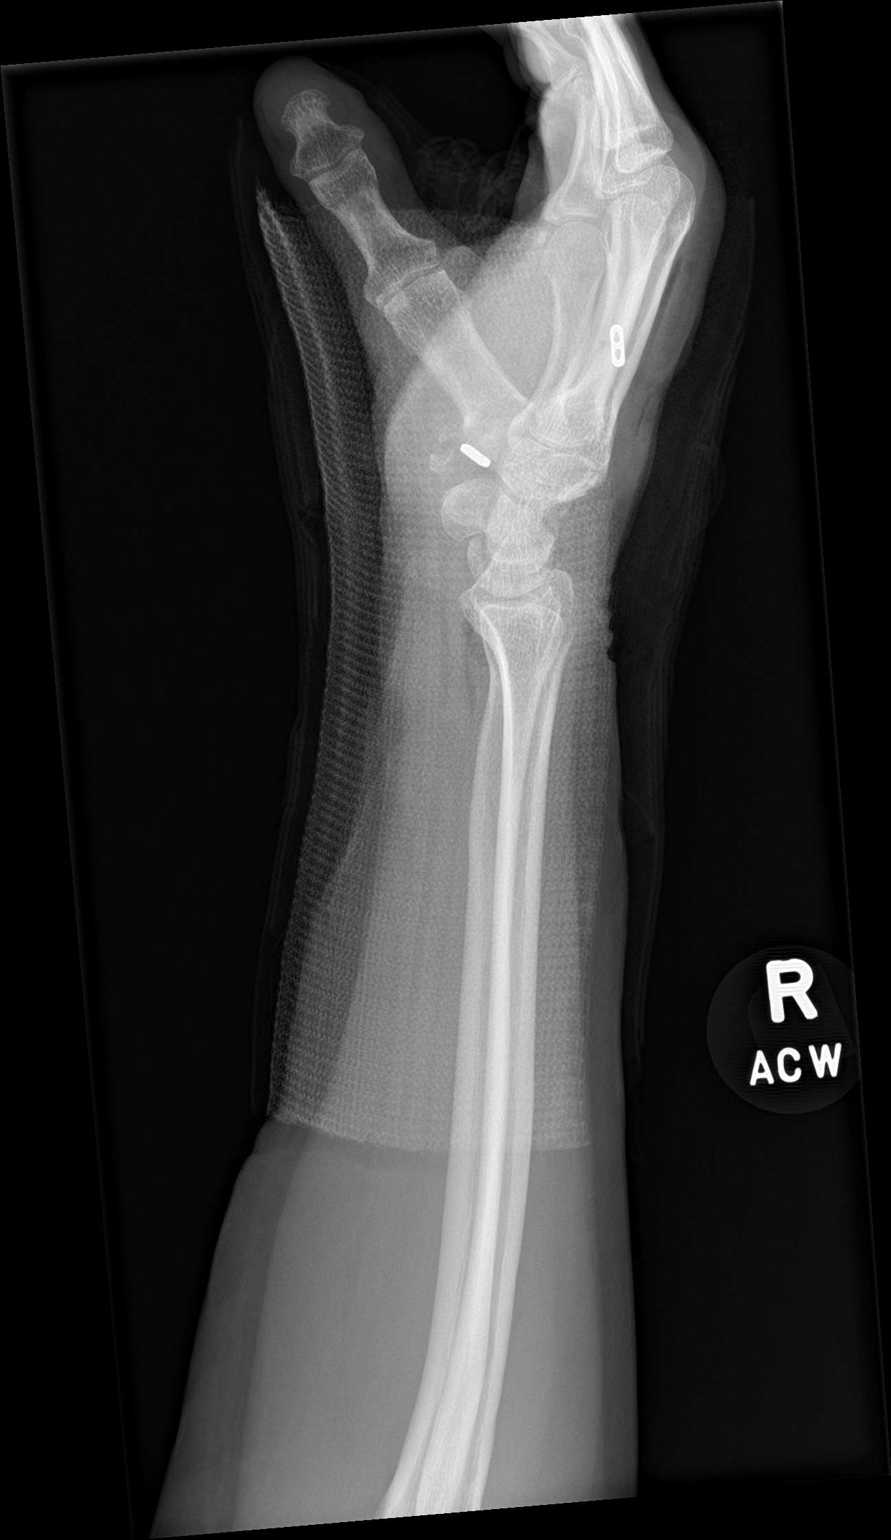

[2 of 2 positions shown; findings below may reference images not displayed]

FINDINGS: Fiberglass splint along the radial aspect of wrist extending to
thumb.

Interval resection of trapezium.

Suture anchors at base of first metacarpal and at second metacarpal
shaft.

Small amount of bony debris at the surgical bed.

No fracture, dislocation, or bone destruction.
IMPRESSION: Post resection of trapezium.

## 2021-09-18 ENCOUNTER — Ambulatory Visit (INDEPENDENT_AMBULATORY_CARE_PROVIDER_SITE_OTHER): Payer: Self-pay | Admitting: Dermatology

## 2021-09-18 ENCOUNTER — Encounter: Payer: Self-pay | Admitting: Dermatology

## 2021-09-18 DIAGNOSIS — L988 Other specified disorders of the skin and subcutaneous tissue: Secondary | ICD-10-CM

## 2021-09-18 NOTE — Progress Notes (Unsigned)
   Follow-Up Visit   Subjective  April Santos is a 66 y.o. female who presents for the following: Facial Elastosis (Patient is here today for Botox injections).    The following portions of the chart were reviewed this encounter and updated as appropriate:       Review of Systems:  No other skin or systemic complaints except as noted in HPI or Assessment and Plan.  Objective  Well appearing patient in no apparent distress; mood and affect are within normal limits.  A focused examination was performed including the face. Relevant physical exam findings are noted in the Assessment and Plan.  Face Rhytides and volume loss.        Assessment & Plan  Elastosis of skin Face  Botox 65 units injected as marked:  - Frown complex 37.5 units - Crow's feet 7.5 units each side - R sup lat forehead 2.5 units - Brow lift 5 units each side  Today we increased the R sup lat forehead injection from 1.25 units to 2.5 units and the lat corrugators from 2.5 units to 5 units each.   Botox Injection - Face Location: See attached image  Informed consent: Discussed risks (infection, pain, bleeding, bruising, swelling, allergic reaction, paralysis of nearby muscles, eyelid droop, double vision, neck weakness, difficulty breathing, headache, undesirable cosmetic result, and need for additional treatment) and benefits of the procedure, as well as the alternatives.  Informed consent was obtained.  Preparation: The area was cleansed with alcohol.  Procedure Details:  Botox was injected into the dermis with a 30-gauge needle. Pressure applied to any bleeding. Ice packs offered for swelling.  Lot Number:  [id number] Expiration:  07/2023  Total Units Injected:  65  Plan: Patient was instructed to remain upright for 4 hours. Patient was instructed to avoid massaging the face and avoid vigorous exercise for the rest of the day. Tylenol may be used for headache.  Allow 2 weeks before returning to  clinic for additional dosing as needed. Patient will call for any problems.    Return in about 4 months (around 01/18/2022) for Botox.  Luther Redo, CMA, am acting as scribe for Sarina Ser, MD .

## 2021-09-18 NOTE — Patient Instructions (Signed)
Due to recent changes in healthcare laws, you may see results of your pathology and/or laboratory studies on MyChart before the doctors have had a chance to review them. We understand that in some cases there may be results that are confusing or concerning to you. Please understand that not all results are received at the same time and often the doctors may need to interpret multiple results in order to provide you with the best plan of care or course of treatment. Therefore, we ask that you please give us 2 business days to thoroughly review all your results before contacting the office for clarification. Should we see a critical lab result, you will be contacted sooner.   If You Need Anything After Your Visit  If you have any questions or concerns for your doctor, please call our main line at [phone] and press option 4 to reach your doctor's medical assistant. If no one answers, please leave a voicemail as directed and we will return your call as soon as possible. Messages left after 4 pm will be answered the following business day.   You may also send us a message via MyChart. We typically respond to MyChart messages within 1-2 business days.  For prescription refills, please ask your pharmacy to contact our office. Our fax number is [phone].  If you have an urgent issue when the clinic is closed that cannot wait until the next business day, you can page your doctor at the number below.    Please note that while we do our best to be available for urgent issues outside of office hours, we are not available 24/7.   If you have an urgent issue and are unable to reach us, you may choose to seek medical care at your doctor's office, retail clinic, urgent care center, or emergency room.  If you have a medical emergency, please immediately call 911 or go to the emergency department.  Pager Numbers  - Dr. Kowalski: [phone]  - Dr. Moye: [phone]  - Dr. Stewart:  [phone]  In the event of inclement weather, please call our main line at [phone] for an update on the status of any delays or closures.  Dermatology Medication Tips: Please keep the boxes that topical medications come in in order to help keep track of the instructions about where and how to use these. Pharmacies typically print the medication instructions only on the boxes and not directly on the medication tubes.   If your medication is too expensive, please contact our office at [phone] option 4 or send us a message through MyChart.   We are unable to tell what your co-pay for medications will be in advance as this is different depending on your insurance coverage. However, we may be able to find a substitute medication at lower cost or fill out paperwork to get insurance to cover a needed medication.   If a prior authorization is required to get your medication covered by your insurance company, please allow us 1-2 business days to complete this process.  Drug prices often vary depending on where the prescription is filled and some pharmacies may offer cheaper prices.  The website www.goodrx.com contains coupons for medications through different pharmacies. The prices here do not account for what the cost may be with help from insurance (it may be cheaper with your insurance), but the website can give you the price if you did not use any insurance.  - You can print the associated coupon and take it with   your prescription to the pharmacy.  - You may also stop by our office during regular business hours and pick up a GoodRx coupon card.  - If you need your prescription sent electronically to a different pharmacy, notify our office through [place] MyChart or by phone at [phone] option 4.     Si Usted Necesita Algo Despus de Su Visita  Tambin puede enviarnos un mensaje a travs de MyChart. Por lo general respondemos a los mensajes de MyChart en el transcurso de 1 a 2  das hbiles.  Para renovar recetas, por favor pida a su farmacia que se ponga en contacto con nuestra oficina. Nuestro nmero de fax es el [phone].  Si tiene un asunto urgente cuando la clnica est cerrada y que no puede esperar hasta el siguiente da hbil, puede llamar/localizar a su doctor(a) al nmero que aparece a continuacin.   Por favor, tenga en cuenta que aunque hacemos todo lo posible para estar disponibles para asuntos urgentes fuera del horario de oficina, no estamos disponibles las 24 horas del da, los 7 das de la semana.   Si tiene un problema urgente y no puede comunicarse con nosotros, puede optar por buscar atencin mdica  en el consultorio de su doctor(a), en una clnica privada, en un centro de atencin urgente o en una sala de emergencias.  Si tiene una emergencia mdica, por favor llame inmediatamente al 911 o vaya a la sala de emergencias.  Nmeros de bper  - Dr. Kowalski: [phone]  - Dra. Moye: [phone]  - Dra. Stewart: [phone]  En caso de inclemencias del tiempo, por favor llame a nuestra lnea principal al [phone] para una actualizacin sobre el estado de cualquier retraso o cierre.  Consejos para la medicacin en dermatologa: Por favor, guarde las cajas en las que vienen los medicamentos de uso tpico para ayudarle a seguir las instrucciones sobre dnde y cmo usarlos. Las farmacias generalmente imprimen las instrucciones del medicamento slo en las cajas y no directamente en los tubos del medicamento.   Si su medicamento es muy caro, por favor, pngase en contacto con nuestra oficina llamando al [phone] y presione la opcin 4 o envenos un mensaje a travs de MyChart.   No podemos decirle cul ser su copago por los medicamentos por adelantado ya que esto es diferente dependiendo de la cobertura de su seguro. Sin embargo, es posible que podamos encontrar un medicamento sustituto a menor costo o llenar un formulario para que el  seguro cubra el medicamento que se considera necesario.   Si se requiere una autorizacin previa para que su compaa de seguros cubra su medicamento, por favor permtanos de 1 a 2 das hbiles para completar este proceso.  Los precios de los medicamentos varan con frecuencia dependiendo del lugar de dnde se surte la receta y alguna farmacias pueden ofrecer precios ms baratos.  El sitio web www.goodrx.com tiene cupones para medicamentos de diferentes farmacias. Los precios aqu no tienen en cuenta lo que podra costar con la ayuda del seguro (puede ser ms barato con su seguro), pero el sitio web puede darle el precio si no utiliz ningn seguro.  - Puede imprimir el cupn correspondiente y llevarlo con su receta a la farmacia.  - Tambin puede pasar por nuestra oficina durante el horario de atencin regular y recoger una tarjeta de cupones de GoodRx.  - Si necesita que su receta se enve electrnicamente a una farmacia diferente, informe a nuestra oficina a travs de MyChart de [facility]   o por telfono llamando al [phone] y presione la opcin 4.  

## 2021-09-18 NOTE — Progress Notes (Unsigned)
   Follow-Up Visit   Subjective  April Santos is a 66 y.o. female who presents for the following: Facial Elastosis (Patient is here today for Botox injections).  The following portions of the chart were reviewed this encounter and updated as appropriate:   Tobacco  Allergies  Meds  Problems  Med Hx  Surg Hx  Fam Hx     Review of Systems:  No other skin or systemic complaints except as noted in HPI or Assessment and Plan.  Objective  Well appearing patient in no apparent distress; mood and affect are within normal limits.  A focused examination was performed including the face. Relevant physical exam findings are noted in the Assessment and Plan.  Face Rhytides and volume loss.       Assessment & Plan  Elastosis of skin Face  Botox 65 units injected as marked:  - Frown complex 37.5 units - Crow's feet 7.5 units each side - R sup lat forehead 2.5 units - Brow lift 5 units each side  Today we increased the R sup lat forehead injection from 1.25 units to 2.5 units and the lat corrugators from 2.5 units to 5 units each.   Botox Injection - Face Location: See attached image  Informed consent: Discussed risks (infection, pain, bleeding, bruising, swelling, allergic reaction, paralysis of nearby muscles, eyelid droop, double vision, neck weakness, difficulty breathing, headache, undesirable cosmetic result, and need for additional treatment) and benefits of the procedure, as well as the alternatives.  Informed consent was obtained.  Preparation: The area was cleansed with alcohol.  Procedure Details:  Botox was injected into the dermis with a 30-gauge needle. Pressure applied to any bleeding. Ice packs offered for swelling.  Lot Number:  [id number] Expiration:  07/2023  Total Units Injected:  65  Plan: Patient was instructed to remain upright for 4 hours. Patient was instructed to avoid massaging the face and avoid vigorous exercise for the rest of the day. Tylenol  may be used for headache.  Allow 2 weeks before returning to clinic for additional dosing as needed. Patient will call for any problems.  Return in about 4 months (around 01/18/2022) for Botox.  Luther Redo, CMA, am acting as scribe for Sarina Ser, MD . Documentation: I have reviewed the above documentation for accuracy and completeness, and I agree with the above.  Sarina Ser, MD

## 2021-10-01 ENCOUNTER — Other Ambulatory Visit (INDEPENDENT_AMBULATORY_CARE_PROVIDER_SITE_OTHER): Payer: Self-pay | Admitting: Vascular Surgery

## 2021-10-01 DIAGNOSIS — M79661 Pain in right lower leg: Secondary | ICD-10-CM

## 2021-10-02 ENCOUNTER — Ambulatory Visit (INDEPENDENT_AMBULATORY_CARE_PROVIDER_SITE_OTHER): Payer: Medicare PPO | Admitting: Vascular Surgery

## 2021-10-02 ENCOUNTER — Encounter (INDEPENDENT_AMBULATORY_CARE_PROVIDER_SITE_OTHER): Payer: Self-pay | Admitting: Vascular Surgery

## 2021-10-02 ENCOUNTER — Ambulatory Visit (INDEPENDENT_AMBULATORY_CARE_PROVIDER_SITE_OTHER): Payer: Medicare PPO

## 2021-10-02 VITALS — BP 121/77 | HR 55 | Resp 12 | Ht 61.0 in | Wt 144.0 lb

## 2021-10-02 DIAGNOSIS — M79661 Pain in right lower leg: Secondary | ICD-10-CM

## 2021-10-02 DIAGNOSIS — I83893 Varicose veins of bilateral lower extremities with other complications: Secondary | ICD-10-CM | POA: Diagnosis not present

## 2021-10-02 DIAGNOSIS — M5416 Radiculopathy, lumbar region: Secondary | ICD-10-CM | POA: Diagnosis not present

## 2021-10-02 DIAGNOSIS — K219 Gastro-esophageal reflux disease without esophagitis: Secondary | ICD-10-CM | POA: Diagnosis not present

## 2021-10-02 DIAGNOSIS — M79662 Pain in left lower leg: Secondary | ICD-10-CM

## 2021-10-02 NOTE — Progress Notes (Addendum)
MRN : [id number]  April Santos is a 66 y.o. (12-Feb-1956) female who presents with chief complaint of  Chief Complaint  Patient presents with   Follow-up    ultrasound  .  History of Present Illness: Patient returns today in follow up of venous insufficiency with leg pain and swelling.  She has been diligently using her prescription strength compression socks, elevating her legs, and remaining active.  She still requires anti-inflammatories for discomfort frequently.  Both legs are affected.  Duplex today shows no evidence of DVT or superficial thrombophlebitis of either lower extremity.  There are segments of reflux in the great saphenous vein in both lower extremities.  Current Outpatient Medications  Medication Sig Dispense Refill   ALPRAZolam (XANAX) 0.25 MG tablet Take 1 tablet (0.25 mg total) by mouth at bedtime as needed. 30 tablet 0   Apremilast (OTEZLA) 30 MG TABS Take 30 mg by mouth daily.     B Complex-C (B-COMPLEX WITH VITAMIN C) tablet Take 1 tablet by mouth daily.     cholecalciferol (VITAMIN D3) 25 MCG (1000 UNIT) tablet Take 1,000 Units by mouth daily.     Crisaborole (EUCRISA) 2 % OINT Apply 1 application topically 2 (two) times daily as needed (psoriasis). 60 g 1   desonide (DESOWEN) 0.05 % cream Apply 1 application topically 2 (two) times daily as needed (psoriasis).     estradiol (ESTRACE) 0.1 MG/GM vaginal cream Insert 1 gram vaginally 1-2 times weekly 42.5 g 2   gabapentin (NEURONTIN) 300 MG capsule Take 300 mg by mouth at bedtime.     RABEprazole (ACIPHEX) 20 MG tablet TAKE ONE TABLET BY MOUTH EVERY DAY 90 tablet 3   Tapinarof (VTAMA) 1 % CREA Apply 1 application. topically daily. Qd to aa psoriasis 60 g 6   estradiol (VIVELLE-DOT) 0.025 MG/24HR Place 1 patch onto the skin 2 (two) times a week. (Patient not taking: Reported on 10/02/2021) 24 patch 0   triamcinolone cream (KENALOG) 0.1 % Apply 1 Application topically 2 (two) times daily as needed. For up to 2 weeks.  Avoid applying to face, groin, and axilla. Use as directed. Long-term use can cause thinning of the skin. 80 g 1   No current facility-administered medications for this visit.    Past Medical History:  Diagnosis Date   Arthritis    Colon polyps    COVID-19 2022   Disc disorder    Dysphagia    Family history of adverse reaction to anesthesia    mother nausea   GERD (gastroesophageal reflux disease)    History of uterine fibroid    Psoriasis     Past Surgical History:  Procedure Laterality Date   ABDOMINAL HYSTERECTOMY     BREAST SURGERY  1999   biopsy   carpal  2005   Tunnel right hand   CARPOMETACARPAL ([place]) FUSION OF THUMB Right 11/16/2020   Procedure: Right CARPOMETACARPAL [facility]) ARTHROPLASTY;  Surgeon: Hessie Knows, MD;  Location: [facility] ORS;  Service: Orthopedics;  Laterality: Right;   COLONOSCOPY WITH ESOPHAGOGASTRODUODENOSCOPY (EGD)     COLONOSCOPY WITH PROPOFOL N/A 06/10/2016   Procedure: COLONOSCOPY WITH PROPOFOL;  Surgeon: Manya Silvas, MD;  Location: [facility] ENDOSCOPY;  Service: Endoscopy;  Laterality: N/A;   ESOPHAGOGASTRODUODENOSCOPY (EGD) WITH PROPOFOL N/A 06/10/2016   Procedure: ESOPHAGOGASTRODUODENOSCOPY (EGD) WITH PROPOFOL;  Surgeon: Manya Silvas, MD;  Location: [facility] ENDOSCOPY;  Service: Endoscopy;  Laterality: N/A;   FOOT SURGERY  02/07/1003   right foot benign tumor   SACROILIAC JOINT  INJECTION  08/17/13 & 04/13/14   Dr. Sharlet Salina   SIGMOIDOSCOPY     UTERINE FIBROID SURGERY  1997     Social History   Tobacco Use   Smoking status: Never   Smokeless tobacco: Never  Vaping Use   Vaping Use: Never used  Substance Use Topics   Alcohol use: Yes    Alcohol/week: 1.0 standard drink of alcohol    Types: 1 Glasses of wine per week    Comment: bimonthly   Drug use: No      Family History  Problem Relation Age of Onset   Breast cancer Mother        66/74   Heart disease Father    Arthritis Father    GER disease Father    Diabetes Brother    Diabetes  Maternal Uncle    Colon cancer Neg Hx      Allergies  Allergen Reactions   Erythromycin Ethylsuccinate [Erythromycin] Other (See Comments)    Unknown   Omnicef [Cefdinir] Hives and Itching      REVIEW OF SYSTEMS (Negative unless checked)   Constitutional: '[]'$ Weight loss  '[]'$ Fever  '[]'$ Chills Cardiac: '[]'$ Chest pain   '[]'$ Chest pressure   '[]'$ Palpitations   '[]'$ Shortness of breath when laying flat   '[]'$ Shortness of breath at rest   '[]'$ Shortness of breath with exertion. Vascular:  '[]'$ Pain in legs with walking   '[]'$ Pain in legs at rest   '[]'$ Pain in legs when laying flat   '[]'$ Claudication   '[]'$ Pain in feet when walking  '[]'$ Pain in feet at rest  '[]'$ Pain in feet when laying flat   '[]'$ History of DVT   '[]'$ Phlebitis   '[x]'$ Swelling in legs   '[x]'$ Varicose veins   '[]'$ Non-healing ulcers Pulmonary:   '[]'$ Uses home oxygen   '[]'$ Productive cough   '[]'$ Hemoptysis   '[]'$ Wheeze  '[]'$ COPD   '[]'$ Asthma Neurologic:  '[]'$ Dizziness  '[]'$ Blackouts   '[]'$ Seizures   '[]'$ History of stroke   '[]'$ History of TIA  '[]'$ Aphasia   '[]'$ Temporary blindness   '[]'$ Dysphagia   '[]'$ Weakness or numbness in arms   '[]'$ Weakness or numbness in legs Musculoskeletal:  '[x]'$ Arthritis   '[]'$ Joint swelling   '[]'$ Joint pain   '[]'$ Low back pain Hematologic:  '[]'$ Easy bruising  '[]'$ Easy bleeding   '[]'$ Hypercoagulable state   '[x]'$ Anemic  '[]'$ Hepatitis Gastrointestinal:  '[]'$ Blood in stool   '[]'$ Vomiting blood  '[x]'$ Gastroesophageal reflux/heartburn   '[]'$ Abdominal pain Genitourinary:  '[]'$ Chronic kidney disease   '[]'$ Difficult urination  '[]'$ Frequent urination  '[]'$ Burning with urination   '[]'$ Hematuria Skin:  '[]'$ Rashes   '[]'$ Ulcers   '[]'$ Wounds Psychological:  '[]'$ History of anxiety   '[]'$  History of major depression.  Physical Examination  BP 121/77 (BP Location: Left Arm)   Pulse (!) 55   Resp 12   Ht '5\' 1"'$  (1.549 m)   Wt 144 lb (65.3 kg)   LMP 02/26/2002   BMI 27.21 kg/m  Gen:  WD/WN, NAD.  Appears younger than stated age Head: [place]/AT, No temporalis wasting. Ear/Nose/Throat: Hearing grossly intact, nares w/o erythema or  drainage Eyes: Conjunctiva clear. Sclera non-icteric Neck: Supple.  Trachea midline Pulmonary:  Good air movement, no use of accessory muscles.  Cardiac: RRR, no JVD Vascular: Varicosities are fairly diffuse and measure 1 to 2 mm in the right lower extremity.  Varicosities are little more scattered in the left lower extremity but most prominent in the medial calf and measuring 1 to 2 mm. Vessel Right Left  Radial Palpable Palpable  PT Palpable Palpable  DP Palpable Palpable   Gastrointestinal: soft, non-tender/non-distended. No guarding/reflex.  Musculoskeletal: M/S 5/5 throughout.  No deformity or atrophy.  Trace bilateral lower extremity edema. Neurologic: Sensation grossly intact in extremities.  Symmetrical.  Speech is fluent.  Psychiatric: Judgment intact, Mood & affect appropriate for pt's clinical situation. Dermatologic: No rashes or ulcers noted.  No cellulitis or open wounds.      Labs No results found for this or any previous visit (from the past 2160 hour(s)).  Radiology VAS [place] LOWER EXTREMITY VENOUS REFLUX  Result Date: 10/03/2021  Lower Venous Reflux Study Patient Name:  JOSIANE LABINE  Date of Exam:   10/02/2021 Medical Rec #: [id number]        Accession #:    [id number] Date of Birth: 1955/05/19        Patient Gender: F Patient Age:   65 years Exam Location:  [facility] Procedure:      VAS [place] LOWER EXTREMITY VENOUS REFLUX Referring Phys: Leotis Pain --------------------------------------------------------------------------------  Indications: Pain.  Performing Technologist: Almira Coaster RVS  Examination Guidelines: A complete evaluation includes B-mode imaging, spectral Doppler, color Doppler, and power Doppler as needed of all accessible portions of each vessel. Bilateral testing is considered an integral part of a complete examination. Limited examinations for reoccurring indications may be performed as noted. The reflux portion  of the exam is performed with the patient in reverse Trendelenburg. Significant venous reflux is defined as >500 ms in the superficial venous system, and >1 second in the deep venous system.  Venous Reflux Times +--------------+---------+------+-----------+------------+--------+ RIGHT         Reflux NoRefluxReflux TimeDiameter cmsComments                         Yes                                  +--------------+---------+------+-----------+------------+--------+ CFV           no                                             +--------------+---------+------+-----------+------------+--------+ FV prox       no                                             +--------------+---------+------+-----------+------------+--------+ FV mid        no                                             +--------------+---------+------+-----------+------------+--------+ FV dist       no                                             +--------------+---------+------+-----------+------------+--------+ Popliteal     no                                             +--------------+---------+------+-----------+------------+--------+  GSV at [facility]              yes    1856 ms      .61              +--------------+---------+------+-----------+------------+--------+ GSV prox thigh          yes    2171 ms      .59              +--------------+---------+------+-----------+------------+--------+ GSV mid thigh no                            .39              +--------------+---------+------+-----------+------------+--------+ GSV dist thighno                            .40              +--------------+---------+------+-----------+------------+--------+ GSV at knee   no                            .34              +--------------+---------+------+-----------+------------+--------+ GSV prox calf no                            .36               +--------------+---------+------+-----------+------------+--------+ SSV Pop Fossa no                            .52              +--------------+---------+------+-----------+------------+--------+  +--------------+---------+------+-----------+------------+--------+ LEFT          Reflux NoRefluxReflux TimeDiameter cmsComments                         Yes                                  +--------------+---------+------+-----------+------------+--------+ CFV           no                                             +--------------+---------+------+-----------+------------+--------+ FV prox       no                                             +--------------+---------+------+-----------+------------+--------+ FV mid        no                                             +--------------+---------+------+-----------+------------+--------+ FV dist       no                                             +--------------+---------+------+-----------+------------+--------+  Popliteal     no                                             +--------------+---------+------+-----------+------------+--------+ GSV at SFJ    no                            .43              +--------------+---------+------+-----------+------------+--------+ GSV prox thighno                            .45              +--------------+---------+------+-----------+------------+--------+ GSV mid thigh no                            .42              +--------------+---------+------+-----------+------------+--------+ GSV dist thighno                            .46              +--------------+---------+------+-----------+------------+--------+ GSV at knee   no                            .32              +--------------+---------+------+-----------+------------+--------+ GSV prox calf           yes                 .39               +--------------+---------+------+-----------+------------+--------+ SSV Pop Fossa no                            .37              +--------------+---------+------+-----------+------------+--------+   Summary: Bilateral: - No evidence of deep vein thrombosis seen in the lower extremities, bilaterally, from the common femoral through the popliteal veins. - No evidence of superficial venous thrombosis in the lower extremities, bilaterally. - No evidence of deep venous insufficiency seen bilaterally in the lower extremity. - No evidence of superficial venous reflux seen in the short saphenous veins bilaterally.  Right: - Venous reflux is noted in the right sapheno-femoral junction. - Venous reflux is noted in the right greater saphenous vein in the thigh.  Left: - Venous reflux is noted in the left greater saphenous vein in the calf.  *See table(s) above for measurements and observations. Electronically signed by Leotis Pain MD on 10/03/2021 at 2:46:21 PM.    Final     Assessment/Plan Lumbar radiculitis Significant portion of the lower extremity pain is likely from lumbar radiculitis.  She understands this but also is having more pain in the lower leg that is not typical neuropathic pain   GERD (gastroesophageal reflux disease) Continue PPI as already ordered, this medication has been reviewed and there are no changes at this time. Avoidence of caffeine and alcohol Moderate elevation of the head of the bed   Varicose veins of leg with swelling, bilateral Duplex today shows no evidence of DVT or superficial thrombophlebitis  of either lower extremity.  There are segments of reflux in the great saphenous vein in both lower extremities. She does still have significant symptoms of pain and swelling in the lower extremities.  She has CEAP class III venous insufficiency with pain and swelling refractory to appropriate conservative therapies.  She does have some back issues that are also part of the equation,  but she has dealt with these for many years as well.  At this point, she is interested in proceeding with laser ablation of both great saphenous veins.  She has done conservative therapy without significant improvement.    Leotis Pain, MD  10/26/2021 9:49 AM    This note was created with Dragon medical transcription system.  Any errors from dictation are purely unintentional

## 2021-10-02 NOTE — Assessment & Plan Note (Addendum)
Duplex today shows no evidence of DVT or superficial thrombophlebitis of either lower extremity.  There are segments of reflux in the great saphenous vein in both lower extremities. She does still have significant symptoms of pain and swelling in the lower extremities.  She has CEAP class III venous insufficiency with pain and swelling refractory to appropriate conservative therapies.  She does have some back issues that are also part of the equation, but she has dealt with these for many years as well.  At this point, she is interested in proceeding with laser ablation of both great saphenous veins.  She has done conservative therapy without significant improvement.

## 2021-10-23 ENCOUNTER — Ambulatory Visit: Payer: Medicare PPO | Admitting: Dermatology

## 2021-10-23 DIAGNOSIS — R21 Rash and other nonspecific skin eruption: Secondary | ICD-10-CM | POA: Diagnosis not present

## 2021-10-23 MED ORDER — TRIAMCINOLONE ACETONIDE 0.1 % EX CREA: 1.0000 | TOPICAL_CREAM | Freq: Two times a day (BID) | CUTANEOUS | 1 refills | Status: DC | PRN

## 2021-10-23 NOTE — Patient Instructions (Addendum)
Continue over the counter lotrimin twice daily until clear  Start TMC 0.1% cream twice daily for up to 2 weeks as needed for rash. Avoid applying to face, groin, and axilla. Use as directed. Long-term use can cause thinning of the skin. (May also be used for bug bites)  Topical steroids (such as triamcinolone, fluocinolone, fluocinonide, mometasone, clobetasol, halobetasol, betamethasone, hydrocortisone) can cause thinning and lightening of the skin if they are used for too long in the same area. Your physician has selected the right strength medicine for your problem and area affected on the body. Please use your medication only as directed by your physician to prevent side effects.   Due to recent changes in healthcare laws, you may see results of your pathology and/or laboratory studies on MyChart before the doctors have had a chance to review them. We understand that in some cases there may be results that are confusing or concerning to you. Please understand that not all results are received at the same time and often the doctors may need to interpret multiple results in order to provide you with the best plan of care or course of treatment. Therefore, we ask that you please give [place] 2 business days to thoroughly review all your results before contacting the office for clarification. Should we see a critical lab result, you will be contacted sooner.   If You Need Anything After Your Visit  If you have any questions or concerns for your doctor, please call our main line at [phone] and press option 4 to reach your doctor's medical assistant. If no one answers, please leave a voicemail as directed and we will return your call as soon as possible. Messages left after 4 pm will be answered the following business day.   You may also send [place] a message via [place]. We typically respond to MyChart messages within 1-2 business days.  For prescription refills, please ask your pharmacy to contact our  office. Our fax number is [phone].  If you have an urgent issue when the clinic is closed that cannot wait until the next business day, you can page your doctor at the number below.    Please note that while we do our best to be available for urgent issues outside of office hours, we are not available 24/7.   If you have an urgent issue and are unable to reach [place], you may choose to seek medical care at your doctor's office, retail clinic, urgent care center, or emergency room.  If you have a medical emergency, please immediately call 911 or go to the emergency department.  Pager Numbers  - Dr. Nehemiah Massed: [phone]  - Dr. Laurence Ferrari: [phone]  - Dr. Nicole Kindred: [phone]  In the event of inclement weather, please call our main line at [phone] for an update on the status of any delays or closures.  Dermatology Medication Tips: Please keep the boxes that topical medications come in in order to help keep track of the instructions about where and how to use these. Pharmacies typically print the medication instructions only on the boxes and not directly on the medication tubes.   If your medication is too expensive, please contact our office at [phone] option 4 or send [place] a message through [place].   We are unable to tell what your co-pay for medications will be in advance as this is different depending on your insurance coverage. However, we may be able to find a substitute medication at lower cost or fill out paperwork  to get insurance to cover a needed medication.   If a prior authorization is required to get your medication covered by your insurance company, please allow [place] 1-2 business days to complete this process.  Drug prices often vary depending on where the prescription is filled and some pharmacies may offer cheaper prices.  The website www.goodrx.com contains coupons for medications through different pharmacies. The prices here do not account for what the cost may  be with help from insurance (it may be cheaper with your insurance), but the website can give you the price if you did not use any insurance.  - You can print the associated coupon and take it with your prescription to the pharmacy.  - You may also stop by our office during regular business hours and pick up a GoodRx coupon card.  - If you need your prescription sent electronically to a different pharmacy, notify our office through [facility] or by phone at [phone] option 4.     Si Usted Necesita Algo Despus de Su Visita  Tambin puede enviarnos un mensaje a travs de Pharmacist, community. Por lo general respondemos a los mensajes de MyChart en el transcurso de 1 a 2 das hbiles.  Para renovar recetas, por favor pida a su farmacia que se ponga en contacto con nuestra oficina. Harland Dingwall de fax es [place] [phone].  Si tiene un asunto urgente cuando la clnica est cerrada y que no puede esperar hasta el siguiente da hbil, puede llamar/localizar a su doctor(a) al nmero que aparece a continuacin.   Por favor, tenga en cuenta que aunque hacemos todo lo posible para estar disponibles para asuntos urgentes fuera del horario de [place], no estamos disponibles las 24 horas del da, los 7 das de la [place].   Si tiene un problema urgente y no puede comunicarse con nosotros, puede optar por buscar atencin mdica  en el consultorio de su doctor(a), en una clnica privada, en un centro de atencin urgente o en una sala de emergencias.  Si tiene Engineering geologist, por favor llame inmediatamente al 911 o vaya a la sala de emergencias.  Nmeros de bper  - Dr. Nehemiah Massed: [phone]  - Dra. Moye: [phone]  - Dra. Nicole Kindred: [phone]  En caso de inclemencias del [place], por favor llame a Johnsie Kindred principal al [phone] para una actualizacin sobre el [place] de cualquier retraso o cierre.  Consejos para la medicacin en dermatologa: Por favor, guarde las cajas en las  que vienen los medicamentos de uso tpico para ayudarle a seguir las instrucciones sobre dnde y cmo usarlos. Las farmacias generalmente imprimen las instrucciones del medicamento slo en las cajas y no directamente en los tubos del [place].   Si su medicamento es muy caro, por favor, pngase en contacto con Zigmund Daniel llamando al [phone] y presione la opcin 4 o envenos un mensaje a travs de Pharmacist, community.   No podemos decirle cul ser su copago por los medicamentos por adelantado ya que esto es diferente dependiendo de la cobertura de su seguro. Sin embargo, es posible que podamos encontrar un medicamento sustituto a Electrical engineer un formulario para que el seguro cubra el medicamento que se considera necesario.   Si se requiere una autorizacin previa para que su compaa de seguros [place] su medicamento, por favor permtanos de 1 a 2 das hbiles para completar este proceso.  Los precios de los medicamentos varan con frecuencia dependiendo del Environmental consultant de dnde se surte la receta y [place]  pueden ofrecer precios ms baratos.  El sitio web www.goodrx.com tiene cupones para medicamentos de Airline pilot. Los precios aqu no tienen en cuenta lo que podra costar con la ayuda del seguro (puede ser ms barato con su seguro), pero el sitio web puede darle el precio si no utiliz Research scientist (physical sciences).  - Puede imprimir el cupn correspondiente y llevarlo con su receta a la farmacia.  - Tambin puede pasar por nuestra oficina durante el horario de atencin regular y Charity fundraiser una tarjeta de cupones de GoodRx.  - Si necesita que su receta se enve electrnicamente a una farmacia diferente, informe a nuestra oficina a travs de MyChart de [place] o por telfono llamando al [phone] y presione la opcin 4.

## 2021-10-23 NOTE — Progress Notes (Signed)
   Follow-Up Visit   Subjective  April Santos is a 66 y.o. female who presents for the following: Rash (Patient here today for itchy rash at right knee and left ring finger. Rash at knee started about 2 weeks ago and the finger started about 1 week ago. Patient does take [place] and uses [place] for psoriasis but she feels this rash looks a little different. She tried lotrimin over the counter and then [facility] 0.1% cream which has helped. ).  Patient has been gardening a lot lately. Normally sees Dr. Nehemiah Massed.   The following portions of the chart were reviewed this encounter and updated as appropriate:   Tobacco  Allergies  Meds  Problems  Med Hx  Surg Hx  Fam Hx      Review of Systems:  No other skin or systemic complaints except as noted in HPI or Assessment and Plan.  Objective  Well appearing patient in no apparent distress; mood and affect are within normal limits.  A focused examination was performed including legs, hands, fingers. Relevant physical exam findings are noted in the Assessment and Plan.  left hand, right knee Slightly scaly pink plaque at right knee with prominent erythema at lateral and inferior borders Left dorsal hand and fingers with scaly pink plaque with fissures and papule at left third finger medial    Assessment & Plan  Rash left hand, right knee  Ddx hand eczema vs psoriasis > tinea Leg ddx psoriasis > eczema > tinea  KOH negative  Continue over the counter lotrimin twice daily until clear  Start [facility] 0.1% cream twice daily for up to 2 weeks as needed for rash. Avoid applying to face, groin, and axilla. Use as directed. Long-term use can cause thinning of the skin.  Topical steroids (such as triamcinolone, fluocinolone, fluocinonide, mometasone, clobetasol, halobetasol, betamethasone, hydrocortisone) can cause thinning and lightening of the skin if they are used for too long in the same area. Your physician has selected the right  strength medicine for your problem and area affected on the body. Please use your medication only as directed by your physician to prevent side effects.    triamcinolone cream (KENALOG) 0.1 % - left hand, right knee Apply 1 Application topically 2 (two) times daily as needed. For up to 2 weeks. Avoid applying to face, groin, and axilla. Use as directed. Long-term use can cause thinning of the skin.   Return for as scheduled.  Graciella Belton, RMA, am acting as scribe for Forest Gleason, MD .   Documentation: I have reviewed the above documentation for accuracy and completeness, and I agree with the above.  Forest Gleason, MD

## 2021-10-24 ENCOUNTER — Telehealth (INDEPENDENT_AMBULATORY_CARE_PROVIDER_SITE_OTHER): Payer: Self-pay | Admitting: Vascular Surgery

## 2021-10-24 NOTE — Telephone Encounter (Signed)
LVM for pt advising that I am waiting for Dr. Lucky Cowboy to add CEAP classification to her notes and I will finish submitting the PA. Nothing further needed at this time.

## 2021-10-28 ENCOUNTER — Encounter: Payer: Self-pay | Admitting: Dermatology

## 2021-11-06 ENCOUNTER — Other Ambulatory Visit (INDEPENDENT_AMBULATORY_CARE_PROVIDER_SITE_OTHER): Payer: Medicare PPO

## 2021-11-06 ENCOUNTER — Telehealth (INDEPENDENT_AMBULATORY_CARE_PROVIDER_SITE_OTHER): Payer: Self-pay | Admitting: Vascular Surgery

## 2021-11-06 DIAGNOSIS — R739 Hyperglycemia, unspecified: Secondary | ICD-10-CM

## 2021-11-06 LAB — HEPATIC FUNCTION PANEL
ALT: 15 U/L (ref 0–35)
AST: 18 U/L (ref 0–37)
Albumin: 4.1 g/dL (ref 3.5–5.2)
Alkaline Phosphatase: 65 U/L (ref 39–117)
Bilirubin, Direct: 0.1 mg/dL (ref 0.0–0.3)
Total Bilirubin: 0.6 mg/dL (ref 0.2–1.2)
Total Protein: 6.7 g/dL (ref 6.0–8.3)

## 2021-11-06 LAB — LIPID PANEL
Cholesterol: 172 mg/dL (ref 0–200)
HDL: 58.3 mg/dL (ref >39.00)
LDL Cholesterol: 100 mg/dL — ABNORMAL HIGH (ref 0–99)
NonHDL: 113.85
Total CHOL/HDL Ratio: 3
Triglycerides: 69 mg/dL (ref 0.0–149.0)
VLDL: 13.8 mg/dL (ref 0.0–40.0)

## 2021-11-06 LAB — BASIC METABOLIC PANEL
BUN: 18 mg/dL (ref 6–23)
CO2: 27 mEq/L (ref 19–32)
Calcium: 9.2 mg/dL (ref 8.4–10.5)
Chloride: 105 mEq/L (ref 96–112)
Creatinine, Ser: 0.86 mg/dL (ref 0.40–1.20)
GFR: 69.9 mL/min (ref >60.00)
Glucose, Bld: 92 mg/dL (ref 70–99)
Potassium: 4 mEq/L (ref 3.5–5.1)
Sodium: 141 mEq/L (ref 135–145)

## 2021-11-06 LAB — HEMOGLOBIN A1C: Hgb A1c MFr Bld: 5.9 % (ref 4.6–6.5)

## 2021-11-06 NOTE — Telephone Encounter (Signed)
LVM for pt advising that we are now able to make the laser appts for her bilateral laser. I advised that I was leaving the office now but she could reach out to me or I would call her tomorrow. Nothing further needed at this time.

## 2021-11-07 ENCOUNTER — Telehealth (INDEPENDENT_AMBULATORY_CARE_PROVIDER_SITE_OTHER): Payer: Self-pay | Admitting: Vascular Surgery

## 2021-11-07 NOTE — Telephone Encounter (Signed)
LVM for pt TCB and schedule appt for laser with Dr. Lucky Cowboy.

## 2021-11-08 ENCOUNTER — Ambulatory Visit (INDEPENDENT_AMBULATORY_CARE_PROVIDER_SITE_OTHER): Payer: Medicare PPO

## 2021-11-08 ENCOUNTER — Encounter: Payer: Self-pay | Admitting: Internal Medicine

## 2021-11-08 ENCOUNTER — Ambulatory Visit (INDEPENDENT_AMBULATORY_CARE_PROVIDER_SITE_OTHER): Payer: Medicare PPO | Admitting: Internal Medicine

## 2021-11-08 ENCOUNTER — Other Ambulatory Visit: Payer: Self-pay | Admitting: Internal Medicine

## 2021-11-08 VITALS — BP 120/74 | HR 60 | Temp 97.8°F | Resp 16 | Ht 61.0 in | Wt 142.8 lb

## 2021-11-08 DIAGNOSIS — M25571 Pain in right ankle and joints of right foot: Secondary | ICD-10-CM | POA: Diagnosis not present

## 2021-11-08 DIAGNOSIS — R928 Other abnormal and inconclusive findings on diagnostic imaging of breast: Secondary | ICD-10-CM

## 2021-11-08 DIAGNOSIS — K219 Gastro-esophageal reflux disease without esophagitis: Secondary | ICD-10-CM

## 2021-11-08 DIAGNOSIS — M5416 Radiculopathy, lumbar region: Secondary | ICD-10-CM | POA: Diagnosis not present

## 2021-11-08 DIAGNOSIS — L4050 Arthropathic psoriasis, unspecified: Secondary | ICD-10-CM

## 2021-11-08 DIAGNOSIS — R739 Hyperglycemia, unspecified: Secondary | ICD-10-CM

## 2021-11-08 DIAGNOSIS — Z0000 Encounter for general adult medical examination without abnormal findings: Secondary | ICD-10-CM

## 2021-11-08 NOTE — Progress Notes (Signed)
Patient ID: April Santos, female   DOB: 01/26/1956, 66 y.o.   MRN: [id number]   Subjective:    Patient ID: April Santos, female    DOB: [birth year], 66 y.o.   MRN: [id number]   Patient here for  her physical exam.   Chief Complaint  Patient presents with   Follow-up    Yearly CPE   .   HPI Scheduled for physical.  Gets her breast exams and pap smears through gyn.  Reports she is doing relatively well.  Has been seeing ortho for lumbar radiculopathy symptoms.  S/p ESI previously. Is scheduled for another injection next week.  Taking gabapentin q hs and ibuprofen in am.  Has had pain - bunion.  Inflamed last month.  Is some better now.  Discussed referral back to Dr Cleda Mccreedy.  Wants to wait until after injection.  Is concerned regarding her right ankle - more prominent area when compared to left.  No increased swelling.  No known injury.  She has been doing an increased amount of walking.  Watching her diet.  Frustrated regarding no weight loss and that her a1c increased some.  Discussed diet and exercise.  Request referral to [facility] - nutritionist.  No chest pain or sob reported.  No abdominal pain or bowel change reported.  Saw Dr Lucky Cowboy - varicose veins.    Past Medical History:  Diagnosis Date   Arthritis    Colon polyps    COVID-19 2022   Disc disorder    Dysphagia    Family history of adverse reaction to anesthesia    mother nausea   GERD (gastroesophageal reflux disease)    History of uterine fibroid    Psoriasis    Past Surgical History:  Procedure Laterality Date   ABDOMINAL HYSTERECTOMY     BREAST SURGERY  1999   biopsy   carpal  2005   Tunnel right hand   CARPOMETACARPAL ([place]) FUSION OF THUMB Right 11/16/2020   Procedure: Right CARPOMETACARPAL [facility]) ARTHROPLASTY;  Surgeon: Hessie Knows, MD;  Location: [facility] ORS;  Service: Orthopedics;  Laterality: Right;   COLONOSCOPY WITH ESOPHAGOGASTRODUODENOSCOPY (EGD)     COLONOSCOPY WITH PROPOFOL N/A 06/10/2016   Procedure:  COLONOSCOPY WITH PROPOFOL;  Surgeon: Manya Silvas, MD;  Location: [facility] ENDOSCOPY;  Service: Endoscopy;  Laterality: N/A;   ESOPHAGOGASTRODUODENOSCOPY (EGD) WITH PROPOFOL N/A 06/10/2016   Procedure: ESOPHAGOGASTRODUODENOSCOPY (EGD) WITH PROPOFOL;  Surgeon: Manya Silvas, MD;  Location: [facility] ENDOSCOPY;  Service: Endoscopy;  Laterality: N/A;   FOOT SURGERY  02/07/1003   right foot benign tumor   SACROILIAC JOINT INJECTION  08/17/13 & 04/13/14   Dr. Sharlet Salina   SIGMOIDOSCOPY     UTERINE FIBROID SURGERY  1997   Family History  Problem Relation Age of Onset   Breast cancer Mother        67/74   Heart disease Father    Arthritis Father    GER disease Father    Diabetes Brother    Diabetes Maternal Uncle    Colon cancer Neg Hx    Social History   Socioeconomic History   Marital status: Significant Other    Spouse name: Bryson Corona   Number of children: 0   Years of education: Not on file   Highest education level: Not on file  Occupational History   Not on file  Tobacco Use   Smoking status: Never   Smokeless tobacco: Never  Vaping Use   Vaping Use: Never used  Substance and Sexual  Activity   Alcohol use: Yes    Alcohol/week: 1.0 standard drink of alcohol    Types: 1 Glasses of wine per week    Comment: bimonthly   Drug use: No   Sexual activity: Yes    Birth control/protection: Surgical    Comment: Hysterectomy  Other Topics Concern   Not on file  Social History Narrative   [name] - POA   Social Determinants of Health   Financial Resource Strain: Low Risk  (07/05/2021)   Overall Financial Resource Strain (CARDIA)    Difficulty of Paying Living Expenses: Not hard at all  Food Insecurity: No Food Insecurity (07/05/2021)   Hunger Vital Sign    Worried About Running Out of Food in the Last Year: Never true    Ran Out of Food in the Last Year: Never true  Transportation Needs: No Transportation Needs (07/05/2021)   PRAPARE - Scientist, research (physical sciences) (Medical): No    Lack of Transportation (Non-Medical): No  Physical Activity: Sufficiently Active (07/05/2021)   Exercise Vital Sign    Days of Exercise per Week: 3 days    Minutes of Exercise per Session: 60 min  Stress: No Stress Concern Present (07/05/2021)   [employer] of Occupational Health - Occupational Stress Questionnaire    Feeling of Stress : Not at all  Social Connections: Unknown (07/05/2021)   Social Connection and Isolation Panel [NHANES]    Frequency of Communication with Friends and Family: Not on file    Frequency of Social Gatherings with Friends and Family: Not on file    Attends Religious Services: Not on file    Active Member of Clubs or Organizations: Not on file    Attends Banker Meetings: Not on file    Marital Status: Married     Review of Systems  Constitutional:  Negative for appetite change and unexpected weight change.  HENT:  Negative for congestion, sinus pressure and sore throat.   Eyes:  Negative for pain and visual disturbance.  Respiratory:  Negative for cough, chest tightness and shortness of breath.   Cardiovascular:  Negative for chest pain, palpitations and leg swelling.  Gastrointestinal:  Negative for abdominal pain, diarrhea, nausea and vomiting.  Genitourinary:  Negative for difficulty urinating and dysuria.  Musculoskeletal:  Negative for joint swelling and myalgias.       Back/leg issues as outlined.  Foot pain as outlined.   Skin:  Negative for color change and rash.  Neurological:  Negative for dizziness, light-headedness and headaches.  Hematological:  Negative for adenopathy. Does not bruise/bleed easily.  Psychiatric/Behavioral:  Negative for agitation and dysphoric mood.        Objective:     BP 120/74 (BP Location: Left Arm, Patient Position: Sitting, Cuff Size: Small)   Pulse 60   Temp 97.8 F (36.6 C) (Temporal)   Resp 16   Ht 5\' 1"  (1.549 m)   Wt 142 lb 12.8 oz (64.8 kg)   LMP  02/26/2002   SpO2 98%   BMI 26.98 kg/m  Wt Readings from Last 3 Encounters:  11/08/21 142 lb 12.8 oz (64.8 kg)  10/02/21 144 lb (65.3 kg)  08/21/21 143 lb (64.9 kg)    Physical Exam Vitals reviewed.  Constitutional:      General: She is not in acute distress.    Appearance: Normal appearance.  HENT:     Head: Normocephalic and atraumatic.     Right Ear: External ear normal.  Left Ear: External ear normal.  Eyes:     General: No scleral icterus.       Right eye: No discharge.        Left eye: No discharge.     Conjunctiva/sclera: Conjunctivae normal.  Neck:     Thyroid: No thyromegaly.  Cardiovascular:     Rate and Rhythm: Normal rate and regular rhythm.  Pulmonary:     Effort: No respiratory distress.     Breath sounds: Normal breath sounds. No wheezing.  Abdominal:     General: Bowel sounds are normal.     Palpations: Abdomen is soft.     Tenderness: There is no abdominal tenderness.  Genitourinary:    Comments: Performed by gyn.  Musculoskeletal:        General: No swelling or tenderness.     Cervical back: Neck supple. No tenderness.     Comments: Bunion - right foot.  ?bony protuberance - right ankle.    Lymphadenopathy:     Cervical: No cervical adenopathy.  Skin:    Findings: No erythema or rash.  Neurological:     Mental Status: She is alert.  Psychiatric:        Mood and Affect: Mood normal.        Behavior: Behavior normal.      Outpatient Encounter Medications as of 11/08/2021  Medication Sig   ALPRAZolam (XANAX) 0.25 MG tablet Take 1 tablet (0.25 mg total) by mouth at bedtime as needed.   Apremilast (OTEZLA) 30 MG TABS Take 30 mg by mouth daily.   B Complex-C (B-COMPLEX WITH VITAMIN C) tablet Take 1 tablet by mouth daily.   cholecalciferol (VITAMIN D3) 25 MCG (1000 UNIT) tablet Take 1,000 Units by mouth daily.   Crisaborole (EUCRISA) 2 % OINT Apply 1 application topically 2 (two) times daily as needed (psoriasis).   desonide (DESOWEN) 0.05 %  cream Apply 1 application topically 2 (two) times daily as needed (psoriasis).   estradiol (ESTRACE) 0.1 MG/GM vaginal cream Insert 1 gram vaginally 1-2 times weekly   estradiol (VIVELLE-DOT) 0.025 MG/24HR Place 1 patch onto the skin 2 (two) times a week.   gabapentin (NEURONTIN) 300 MG capsule Take 300 mg by mouth at bedtime.   RABEprazole (ACIPHEX) 20 MG tablet TAKE ONE TABLET BY MOUTH EVERY DAY   Tapinarof (VTAMA) 1 % CREA Apply 1 application. topically daily. Qd to aa psoriasis   triamcinolone cream (KENALOG) 0.1 % Apply 1 Application topically 2 (two) times daily as needed. For up to 2 weeks. Avoid applying to face, groin, and axilla. Use as directed. Long-term use can cause thinning of the skin.   No facility-administered encounter medications on file as of 11/08/2021.     Lab Results  Component Value Date   WBC 4.3 07/03/2021   HGB 13.3 07/03/2021   HCT 40.3 07/03/2021   PLT 270.0 07/03/2021   GLUCOSE 92 11/06/2021   CHOL 172 11/06/2021   TRIG 69.0 11/06/2021   HDL 58.30 11/06/2021   LDLCALC 100 (H) 11/06/2021   ALT 15 11/06/2021   AST 18 11/06/2021   NA 141 11/06/2021   K 4.0 11/06/2021   CL 105 11/06/2021   CREATININE 0.86 11/06/2021   BUN 18 11/06/2021   CO2 27 11/06/2021   TSH 2.30 02/26/2021   HGBA1C 5.9 11/06/2021       Assessment & Plan:   Problem List Items Addressed This Visit     Abnormal mammogram    Mammogram 12/20/20 - birads 0.  F/u right breast mammogram with ultrasound 01/08/21 - (oval simple cyst - 12:00 position) birads II.  Recommended annual mammogram.       GERD (gastroesophageal reflux disease)    On aciphex.  No upper symptoms reported.        Health care maintenance    Physical today 11/08/21. Breast and pelvic exam through gyn.  Mammogram 01/08/21- Birads II.  colonosocpy 06/2016.  Recommended f/u in 10 years.        Hyperglycemia    Low carb diet and exercise.  Follow met b and a1c. Discussed diet and exercise today. Information given.   Request referral to [facility] nutritionist for diet instruction.       Relevant Orders   Amb ref to Medical Nutrition Therapy-MNT   Lipid panel   Hemoglobin A1c   Lumbar radiculopathy    Seeing ortho.  Responded well to previous ESI.  Planning injection next week.       Psoriatic arthritis ([place])    Followed by dermatology.  On otezla.        Relevant Orders   TSH   Hepatic function panel   Basic metabolic panel   Right ankle pain    Appears to have a bony protuberance as outlined.  No known injury.  Check xray.       Relevant Orders   DG Ankle 2 Views Right (Completed)   Other Visit Diagnoses     Routine general medical examination at a health care facility    -  Primary        Einar Pheasant, MD

## 2021-11-08 NOTE — Assessment & Plan Note (Addendum)
Physical today 11/08/21. Breast and pelvic exam through gyn.  Mammogram 01/08/21- Birads II.  colonosocpy 06/2016.  Recommended f/u in 10 years.

## 2021-11-09 ENCOUNTER — Encounter: Payer: Self-pay | Admitting: Internal Medicine

## 2021-11-09 NOTE — Telephone Encounter (Signed)
Lm for pt to cb.

## 2021-11-09 NOTE — Assessment & Plan Note (Signed)
Appears to have a bony protuberance as outlined.  No known injury.  Check xray.

## 2021-11-09 NOTE — Assessment & Plan Note (Signed)
On aciphex.  No upper symptoms reported.

## 2021-11-09 NOTE — Assessment & Plan Note (Signed)
Followed by dermatology.  On otezla.   

## 2021-11-09 NOTE — Assessment & Plan Note (Signed)
Low carb diet and exercise.  Follow met b and a1c. Discussed diet and exercise today. Information given.  Request referral to [facility] nutritionist for diet instruction.  

## 2021-11-09 NOTE — Assessment & Plan Note (Signed)
Seeing ortho.  Responded well to previous ESI.  Planning injection next week.

## 2021-11-09 NOTE — Telephone Encounter (Signed)
Saw her yesterday.  No mention of needing refill.  I do not see where she has refilled this medication (checked PDMP).  Please confirm if has taken, needed for?, etc.

## 2021-11-09 NOTE — Assessment & Plan Note (Signed)
Mammogram 12/20/20 - birads 0.  F/u right breast mammogram with ultrasound 01/08/21 - (oval simple cyst - 12:00 position) birads II.  Recommended annual mammogram.

## 2021-11-12 ENCOUNTER — Other Ambulatory Visit: Payer: Self-pay

## 2021-11-12 ENCOUNTER — Encounter: Payer: Self-pay | Admitting: Internal Medicine

## 2021-11-12 ENCOUNTER — Other Ambulatory Visit: Payer: Self-pay | Admitting: Internal Medicine

## 2021-11-13 MED ORDER — ALPRAZOLAM 0.25 MG PO TABS
0.2500 mg | ORAL_TABLET | Freq: Every evening | ORAL | 0 refills | Status: DC | PRN
Start: 2021-11-13 — End: 2023-02-26

## 2021-11-13 NOTE — Telephone Encounter (Signed)
Duplicate.  Rx for alprazolam already sent in to pharmacy.

## 2021-11-14 DIAGNOSIS — M65332 Trigger finger, left middle finger: Secondary | ICD-10-CM | POA: Diagnosis not present

## 2021-11-19 DIAGNOSIS — M5416 Radiculopathy, lumbar region: Secondary | ICD-10-CM | POA: Diagnosis not present

## 2021-11-19 DIAGNOSIS — M5126 Other intervertebral disc displacement, lumbar region: Secondary | ICD-10-CM | POA: Diagnosis not present

## 2021-11-27 ENCOUNTER — Telehealth (INDEPENDENT_AMBULATORY_CARE_PROVIDER_SITE_OTHER): Payer: Self-pay | Admitting: Vascular Surgery

## 2021-11-27 NOTE — Telephone Encounter (Signed)
LVM on April Santos's confidential VM advising that the pt only needs 2 units of 24235 (bilateral). I also advised that I was trying to extend the expiration date due to the provider's schedules. I could not get pt scheduled earlier. Will wait for response from [facility] with [facility].

## 2021-11-29 DIAGNOSIS — M7051 Other bursitis of knee, right knee: Secondary | ICD-10-CM | POA: Diagnosis not present

## 2021-12-05 ENCOUNTER — Other Ambulatory Visit (HOSPITAL_COMMUNITY): Payer: Self-pay | Admitting: Orthopedic Surgery

## 2021-12-05 ENCOUNTER — Other Ambulatory Visit: Payer: Self-pay | Admitting: Orthopedic Surgery

## 2021-12-05 ENCOUNTER — Ambulatory Visit
Admission: RE | Admit: 2021-12-05 | Discharge: 2021-12-05 | Disposition: A | Discharge status: Home / Self Care | Payer: Medicare PPO | Source: Ambulatory Visit | Attending: Orthopedic Surgery | Admitting: Orthopedic Surgery

## 2021-12-05 DIAGNOSIS — M7989 Other specified soft tissue disorders: Secondary | ICD-10-CM

## 2021-12-05 DIAGNOSIS — M79661 Pain in right lower leg: Secondary | ICD-10-CM | POA: Diagnosis not present

## 2021-12-17 ENCOUNTER — Telehealth (INDEPENDENT_AMBULATORY_CARE_PROVIDER_SITE_OTHER): Payer: Self-pay | Admitting: Vascular Surgery

## 2021-12-17 DIAGNOSIS — M7989 Other specified soft tissue disorders: Secondary | ICD-10-CM | POA: Diagnosis not present

## 2021-12-17 DIAGNOSIS — M7050 Other bursitis of knee, unspecified knee: Secondary | ICD-10-CM | POA: Diagnosis not present

## 2021-12-17 NOTE — Telephone Encounter (Signed)
Patient came in today to pick up paperwork for her lasers. I had filled out paperwork already and was waiting on the RX to be filled out. Patient took paperwork for both procedures (right and left leg) from [place]. Per [place], April Santos will be sending in the RX to patient's pharmacy for both procedures (left and right). Nothing further is needed at this time.

## 2021-12-17 NOTE — Telephone Encounter (Signed)
April can you send in Rx for this patient please when you get a chance

## 2021-12-18 NOTE — Telephone Encounter (Signed)
Prescription for Xanax 0.'5mg'$  has been called into pharmacy for both laser procedures

## 2021-12-21 DIAGNOSIS — Z1231 Encounter for screening mammogram for malignant neoplasm of breast: Secondary | ICD-10-CM | POA: Diagnosis not present

## 2022-01-01 ENCOUNTER — Encounter: Payer: Self-pay | Admitting: Dermatology

## 2022-01-01 ENCOUNTER — Ambulatory Visit (INDEPENDENT_AMBULATORY_CARE_PROVIDER_SITE_OTHER): Payer: Self-pay | Admitting: Dermatology

## 2022-01-01 DIAGNOSIS — L988 Other specified disorders of the skin and subcutaneous tissue: Secondary | ICD-10-CM

## 2022-01-01 NOTE — Patient Instructions (Signed)
Due to recent changes in healthcare laws, you may see results of your pathology and/or laboratory studies on MyChart before the doctors have had a chance to review them. We understand that in some cases there may be results that are confusing or concerning to you. Please understand that not all results are received at the same time and often the doctors may need to interpret multiple results in order to provide you with the best plan of care or course of treatment. Therefore, we ask that you please give us 2 business days to thoroughly review all your results before contacting the office for clarification. Should we see a critical lab result, you will be contacted sooner.   If You Need Anything After Your Visit  If you have any questions or concerns for your doctor, please call our main line at [phone] and press option 4 to reach your doctor's medical assistant. If no one answers, please leave a voicemail as directed and we will return your call as soon as possible. Messages left after 4 pm will be answered the following business day.   You may also send us a message via MyChart. We typically respond to MyChart messages within 1-2 business days.  For prescription refills, please ask your pharmacy to contact our office. Our fax number is [phone].  If you have an urgent issue when the clinic is closed that cannot wait until the next business day, you can page your doctor at the number below.    Please note that while we do our best to be available for urgent issues outside of office hours, we are not available 24/7.   If you have an urgent issue and are unable to reach us, you may choose to seek medical care at your doctor's office, retail clinic, urgent care center, or emergency room.  If you have a medical emergency, please immediately call 911 or go to the emergency department.  Pager Numbers  - Dr. Kowalski: [phone]  - Dr. Moye: [phone]  - Dr. Stewart:  [phone]  In the event of inclement weather, please call our main line at [phone] for an update on the status of any delays or closures.  Dermatology Medication Tips: Please keep the boxes that topical medications come in in order to help keep track of the instructions about where and how to use these. Pharmacies typically print the medication instructions only on the boxes and not directly on the medication tubes.   If your medication is too expensive, please contact our office at [phone] option 4 or send us a message through MyChart.   We are unable to tell what your co-pay for medications will be in advance as this is different depending on your insurance coverage. However, we may be able to find a substitute medication at lower cost or fill out paperwork to get insurance to cover a needed medication.   If a prior authorization is required to get your medication covered by your insurance company, please allow us 1-2 business days to complete this process.  Drug prices often vary depending on where the prescription is filled and some pharmacies may offer cheaper prices.  The website www.goodrx.com contains coupons for medications through different pharmacies. The prices here do not account for what the cost may be with help from insurance (it may be cheaper with your insurance), but the website can give you the price if you did not use any insurance.  - You can print the associated coupon and take it with   your prescription to the pharmacy.  - You may also stop by our office during regular business hours and pick up a GoodRx coupon card.  - If you need your prescription sent electronically to a different pharmacy, notify our office through [place] MyChart or by phone at [phone] option 4.     Si Usted Necesita Algo Despus de Su Visita  Tambin puede enviarnos un mensaje a travs de MyChart. Por lo general respondemos a los mensajes de MyChart en el transcurso de 1 a 2  das hbiles.  Para renovar recetas, por favor pida a su farmacia que se ponga en contacto con nuestra oficina. Nuestro nmero de fax es el [phone].  Si tiene un asunto urgente cuando la clnica est cerrada y que no puede esperar hasta el siguiente da hbil, puede llamar/localizar a su doctor(a) al nmero que aparece a continuacin.   Por favor, tenga en cuenta que aunque hacemos todo lo posible para estar disponibles para asuntos urgentes fuera del horario de oficina, no estamos disponibles las 24 horas del da, los 7 das de la semana.   Si tiene un problema urgente y no puede comunicarse con nosotros, puede optar por buscar atencin mdica  en el consultorio de su doctor(a), en una clnica privada, en un centro de atencin urgente o en una sala de emergencias.  Si tiene una emergencia mdica, por favor llame inmediatamente al 911 o vaya a la sala de emergencias.  Nmeros de bper  - Dr. Kowalski: [phone]  - Dra. Moye: [phone]  - Dra. Stewart: [phone]  En caso de inclemencias del tiempo, por favor llame a nuestra lnea principal al [phone] para una actualizacin sobre el estado de cualquier retraso o cierre.  Consejos para la medicacin en dermatologa: Por favor, guarde las cajas en las que vienen los medicamentos de uso tpico para ayudarle a seguir las instrucciones sobre dnde y cmo usarlos. Las farmacias generalmente imprimen las instrucciones del medicamento slo en las cajas y no directamente en los tubos del medicamento.   Si su medicamento es muy caro, por favor, pngase en contacto con nuestra oficina llamando al [phone] y presione la opcin 4 o envenos un mensaje a travs de MyChart.   No podemos decirle cul ser su copago por los medicamentos por adelantado ya que esto es diferente dependiendo de la cobertura de su seguro. Sin embargo, es posible que podamos encontrar un medicamento sustituto a menor costo o llenar un formulario para que el  seguro cubra el medicamento que se considera necesario.   Si se requiere una autorizacin previa para que su compaa de seguros cubra su medicamento, por favor permtanos de 1 a 2 das hbiles para completar este proceso.  Los precios de los medicamentos varan con frecuencia dependiendo del lugar de dnde se surte la receta y alguna farmacias pueden ofrecer precios ms baratos.  El sitio web www.goodrx.com tiene cupones para medicamentos de diferentes farmacias. Los precios aqu no tienen en cuenta lo que podra costar con la ayuda del seguro (puede ser ms barato con su seguro), pero el sitio web puede darle el precio si no utiliz ningn seguro.  - Puede imprimir el cupn correspondiente y llevarlo con su receta a la farmacia.  - Tambin puede pasar por nuestra oficina durante el horario de atencin regular y recoger una tarjeta de cupones de GoodRx.  - Si necesita que su receta se enve electrnicamente a una farmacia diferente, informe a nuestra oficina a travs de MyChart de [place]   o por telfono llamando al [phone] y presione la opcin 4.  

## 2022-01-01 NOTE — Progress Notes (Unsigned)
   Follow-Up Visit   Subjective  April Santos is a 66 y.o. female who presents for the following: Facial Elastosis (Patient here for Botox. Would like to see if can get more eyebrow lift on right side. Droops quickly due to scar from childhood).    The following portions of the chart were reviewed this encounter and updated as appropriate:       Review of Systems:  No other skin or systemic complaints except as noted in HPI or Assessment and Plan.  Objective  Well appearing patient in no apparent distress; mood and affect are within normal limits.  A focused examination was performed including face. Relevant physical exam findings are noted in the Assessment and Plan.  face Rhytides and volume loss.     Assessment & Plan  Elastosis of skin face  Botox 70 units injected as marked:  - Frown complex 37.5 units - Crow's feet 10 units each side - R sup lat forehead 2.5 units - Brow lift 5 units each side  Increased crow's feet to 10 units each side from 7.5 units.  Botox Injection - face   Return for Botox 3-4 months.  IMarye Round, CMA, am acting as scribe for Sarina Ser, MD .

## 2022-01-01 NOTE — Progress Notes (Unsigned)
   New Patient Visit  Subjective  April Santos is a 66 y.o. female who presents for the following: Facial Elastosis (Patient here for Botox. Would like to see if can get more eyebrow lift on right side. Droops quickly due to scar from childhood).    Objective  Well appearing patient in no apparent distress; mood and affect are within normal limits.  A focused examination was performed including face. Relevant physical exam findings are noted in the Assessment and Plan.  face Rhytides and volume loss.       Assessment & Plan  Elastosis of skin face  Botox 67.5 units injected as marked:  - Frown complex 37.5 units - Crow's feet 10 units each side - Brow lift 5 units each side  Increased crow's feet to 10 units each side from 7.5 units, omitted R sup lat forehead 2.5 units to see if helps with brow lift.  Patient will c/b in 3-4 weeks if needs additional Botox at right forehead.   Botox Injection - face Location: See attached image  Informed consent: Discussed risks (infection, pain, bleeding, bruising, swelling, allergic reaction, paralysis of nearby muscles, eyelid droop, double vision, neck weakness, difficulty breathing, headache, undesirable cosmetic result, and need for additional treatment) and benefits of the procedure, as well as the alternatives.  Informed consent was obtained.  Preparation: The area was cleansed with alcohol.  Procedure Details:  Botox was injected into the dermis with a 30-gauge needle. Pressure applied to any bleeding. Ice packs offered for swelling.  Lot Number:  [id number] Expiration:  02/2024  Total Units Injected:  67.5  Plan: Patient was instructed to remain upright for 4 hours. Patient was instructed to avoid massaging the face and avoid vigorous exercise for the rest of the day. Tylenol may be used for headache.  Allow 2 weeks before returning to clinic for additional dosing as needed. Patient will call for any problems.    Return  for Botox 3-4 months.  IEmelia Salisbury, CMA, am acting as scribe for Sarina Ser, MD.

## 2022-01-02 ENCOUNTER — Encounter: Payer: Self-pay | Admitting: Dermatology

## 2022-01-07 ENCOUNTER — Ambulatory Visit: Payer: Medicare PPO | Admitting: Dermatology

## 2022-01-07 DIAGNOSIS — L409 Psoriasis, unspecified: Secondary | ICD-10-CM

## 2022-01-07 DIAGNOSIS — Z79899 Other long term (current) drug therapy: Secondary | ICD-10-CM | POA: Diagnosis not present

## 2022-01-07 NOTE — Patient Instructions (Signed)
Due to recent changes in healthcare laws, you may see results of your pathology and/or laboratory studies on MyChart before the doctors have had a chance to review them. We understand that in some cases there may be results that are confusing or concerning to you. Please understand that not all results are received at the same time and often the doctors may need to interpret multiple results in order to provide you with the best plan of care or course of treatment. Therefore, we ask that you please give us 2 business days to thoroughly review all your results before contacting the office for clarification. Should we see a critical lab result, you will be contacted sooner.   If You Need Anything After Your Visit  If you have any questions or concerns for your doctor, please call our main line at [phone] and press option 4 to reach your doctor's medical assistant. If no one answers, please leave a voicemail as directed and we will return your call as soon as possible. Messages left after 4 pm will be answered the following business day.   You may also send us a message via MyChart. We typically respond to MyChart messages within 1-2 business days.  For prescription refills, please ask your pharmacy to contact our office. Our fax number is [phone].  If you have an urgent issue when the clinic is closed that cannot wait until the next business day, you can page your doctor at the number below.    Please note that while we do our best to be available for urgent issues outside of office hours, we are not available 24/7.   If you have an urgent issue and are unable to reach us, you may choose to seek medical care at your doctor's office, retail clinic, urgent care center, or emergency room.  If you have a medical emergency, please immediately call 911 or go to the emergency department.  Pager Numbers  - Dr. Kowalski: [phone]  - Dr. Moye: [phone]  - Dr. Stewart:  [phone]  In the event of inclement weather, please call our main line at [phone] for an update on the status of any delays or closures.  Dermatology Medication Tips: Please keep the boxes that topical medications come in in order to help keep track of the instructions about where and how to use these. Pharmacies typically print the medication instructions only on the boxes and not directly on the medication tubes.   If your medication is too expensive, please contact our office at [phone] option 4 or send us a message through MyChart.   We are unable to tell what your co-pay for medications will be in advance as this is different depending on your insurance coverage. However, we may be able to find a substitute medication at lower cost or fill out paperwork to get insurance to cover a needed medication.   If a prior authorization is required to get your medication covered by your insurance company, please allow us 1-2 business days to complete this process.  Drug prices often vary depending on where the prescription is filled and some pharmacies may offer cheaper prices.  The website www.goodrx.com contains coupons for medications through different pharmacies. The prices here do not account for what the cost may be with help from insurance (it may be cheaper with your insurance), but the website can give you the price if you did not use any insurance.  - You can print the associated coupon and take it with   your prescription to the pharmacy.  - You may also stop by our office during regular business hours and pick up a GoodRx coupon card.  - If you need your prescription sent electronically to a different pharmacy, notify our office through [facility] MyChart or by phone at [phone] option 4.     Si Usted Necesita Algo Despus de Su Visita  Tambin puede enviarnos un mensaje a travs de MyChart. Por lo general respondemos a los mensajes de MyChart en el transcurso de 1 a 2  das hbiles.  Para renovar recetas, por favor pida a su farmacia que se ponga en contacto con nuestra oficina. Nuestro nmero de fax es el [phone].  Si tiene un asunto urgente cuando la clnica est cerrada y que no puede esperar hasta el siguiente da hbil, puede llamar/localizar a su doctor(a) al nmero que aparece a continuacin.   Por favor, tenga en cuenta que aunque hacemos todo lo posible para estar disponibles para asuntos urgentes fuera del horario de oficina, no estamos disponibles las 24 horas del da, los 7 das de la semana.   Si tiene un problema urgente y no puede comunicarse con nosotros, puede optar por buscar atencin mdica  en el consultorio de su doctor(a), en una clnica privada, en un centro de atencin urgente o en una sala de emergencias.  Si tiene una emergencia mdica, por favor llame inmediatamente al 911 o vaya a la sala de emergencias.  Nmeros de bper  - Dr. Kowalski: [phone]  - Dra. Moye: [phone]  - Dra. Stewart: [phone]  En caso de inclemencias del tiempo, por favor llame a nuestra lnea principal al [phone] para una actualizacin sobre el estado de cualquier retraso o cierre.  Consejos para la medicacin en dermatologa: Por favor, guarde las cajas en las que vienen los medicamentos de uso tpico para ayudarle a seguir las instrucciones sobre dnde y cmo usarlos. Las farmacias generalmente imprimen las instrucciones del medicamento slo en las cajas y no directamente en los tubos del medicamento.   Si su medicamento es muy caro, por favor, pngase en contacto con nuestra oficina llamando al [phone] y presione la opcin 4 o envenos un mensaje a travs de MyChart.   No podemos decirle cul ser su copago por los medicamentos por adelantado ya que esto es diferente dependiendo de la cobertura de su seguro. Sin embargo, es posible que podamos encontrar un medicamento sustituto a menor costo o llenar un formulario para que el  seguro cubra el medicamento que se considera necesario.   Si se requiere una autorizacin previa para que su compaa de seguros cubra su medicamento, por favor permtanos de 1 a 2 das hbiles para completar este proceso.  Los precios de los medicamentos varan con frecuencia dependiendo del lugar de dnde se surte la receta y alguna farmacias pueden ofrecer precios ms baratos.  El sitio web www.goodrx.com tiene cupones para medicamentos de diferentes farmacias. Los precios aqu no tienen en cuenta lo que podra costar con la ayuda del seguro (puede ser ms barato con su seguro), pero el sitio web puede darle el precio si no utiliz ningn seguro.  - Puede imprimir el cupn correspondiente y llevarlo con su receta a la farmacia.  - Tambin puede pasar por nuestra oficina durante el horario de atencin regular y recoger una tarjeta de cupones de GoodRx.  - Si necesita que su receta se enve electrnicamente a una farmacia diferente, informe a nuestra oficina a travs de MyChart de    o por telfono llamando al [phone] y presione la opcin 4.  

## 2022-01-07 NOTE — Progress Notes (Signed)
   Follow-Up Visit   Subjective  April Santos is a 66 y.o. female who presents for the following: Psoriasis (6 months f/u on Psoriasis on the elbows and knees, patient taking Otezla 30 mg qd and using Vtama cream with a fair response. ). Patient was prescribed Triamcinolone cream for psoriasis flare on the right leg 2 months ago which helped.   The following portions of the chart were reviewed this encounter and updated as appropriate:   Tobacco  Allergies  Meds  Problems  Med Hx  Surg Hx  Fam Hx     Review of Systems:  No other skin or systemic complaints except as noted in HPI or Assessment and Plan.  Objective  Well appearing patient in no apparent distress; mood and affect are within normal limits.  A focused examination was performed including face,arms,legs. Relevant physical exam findings are noted in the Assessment and Plan.  knees, elbows Small plaque on the right knee and elbows   Assessment & Plan  Psoriasis knees, elbows  Psoriasis is a chronic non-curable, but treatable genetic/hereditary disease that may have other systemic features affecting other organ systems such as joints (Psoriatic Arthritis). It is associated with an increased risk of inflammatory bowel disease, heart disease, non-alcoholic fatty liver disease, and depression.    Continue Otezla 30 mg tablet once a day  Side effects of Otezla (apremilast) include diarrhea, nausea, headache, upper respiratory infection, depression, and weight decrease (5-10%). It should only be taken by pregnant women after a discussion regarding risks and benefits with their doctor. Goal is control of skin condition, not cure.  The use of [name] requires long term medication management, including periodic office visits.   Start Triamcinolone cream qd-bid for 2 weeks then stop for 2 weeks, alternating with Vtama Start Vtama cream apply to affected skin daily for 2 weeks   Related Medications Crisaborole (EUCRISA) 2 %  OINT Apply 1 application topically 2 (two) times daily as needed (psoriasis).  Tapinarof (VTAMA) 1 % CREA Apply 1 application. topically daily. Qd to aa psoriasis   Return in about 6 months (around 07/09/2022) for Psoriasis .  IMarye Round, CMA, am acting as scribe for Sarina Ser, MD .  Documentation: I have reviewed the above documentation for accuracy and completeness, and I agree with the above.  Sarina Ser, MD

## 2022-01-08 ENCOUNTER — Encounter: Payer: Self-pay | Admitting: Dermatology

## 2022-01-15 DIAGNOSIS — M5126 Other intervertebral disc displacement, lumbar region: Secondary | ICD-10-CM | POA: Diagnosis not present

## 2022-01-15 DIAGNOSIS — M5416 Radiculopathy, lumbar region: Secondary | ICD-10-CM | POA: Diagnosis not present

## 2022-01-15 DIAGNOSIS — M48062 Spinal stenosis, lumbar region with neurogenic claudication: Secondary | ICD-10-CM | POA: Diagnosis not present

## 2022-01-23 DIAGNOSIS — M47816 Spondylosis without myelopathy or radiculopathy, lumbar region: Secondary | ICD-10-CM | POA: Diagnosis not present

## 2022-01-23 DIAGNOSIS — M4316 Spondylolisthesis, lumbar region: Secondary | ICD-10-CM | POA: Diagnosis not present

## 2022-01-23 DIAGNOSIS — M5416 Radiculopathy, lumbar region: Secondary | ICD-10-CM | POA: Diagnosis not present

## 2022-01-23 DIAGNOSIS — M5136 Other intervertebral disc degeneration, lumbar region: Secondary | ICD-10-CM | POA: Diagnosis not present

## 2022-01-24 ENCOUNTER — Telehealth (INDEPENDENT_AMBULATORY_CARE_PROVIDER_SITE_OTHER): Payer: Self-pay

## 2022-01-24 NOTE — Telephone Encounter (Signed)
Patient was made aware with medical recommendations and verbalized understanding 

## 2022-01-25 ENCOUNTER — Encounter (INDEPENDENT_AMBULATORY_CARE_PROVIDER_SITE_OTHER): Payer: Self-pay | Admitting: Vascular Surgery

## 2022-01-25 ENCOUNTER — Ambulatory Visit (INDEPENDENT_AMBULATORY_CARE_PROVIDER_SITE_OTHER): Payer: Medicare PPO | Admitting: Vascular Surgery

## 2022-01-25 VITALS — BP 107/74 | HR 72 | Resp 17 | Ht 61.0 in | Wt 140.4 lb

## 2022-01-25 DIAGNOSIS — I83893 Varicose veins of bilateral lower extremities with other complications: Secondary | ICD-10-CM

## 2022-01-25 NOTE — Progress Notes (Signed)
April Santos is a 66 y.o. female who presents with symptomatic venous reflux  Past Medical History:  Diagnosis Date   Arthritis    Colon polyps    COVID-19 2022   Disc disorder    Dysphagia    Family history of adverse reaction to anesthesia    mother nausea   GERD (gastroesophageal reflux disease)    History of uterine fibroid    Psoriasis     Past Surgical History:  Procedure Laterality Date   ABDOMINAL HYSTERECTOMY     BREAST SURGERY  1999   biopsy   carpal  2005   Tunnel right hand   CARPOMETACARPAL ([place]) FUSION OF THUMB Right 11/16/2020   Procedure: Right CARPOMETACARPAL [facility]) ARTHROPLASTY;  Surgeon: Hessie Knows, MD;  Location: [facility] ORS;  Service: Orthopedics;  Laterality: Right;   COLONOSCOPY WITH ESOPHAGOGASTRODUODENOSCOPY (EGD)     COLONOSCOPY WITH PROPOFOL N/A 06/10/2016   Procedure: COLONOSCOPY WITH PROPOFOL;  Surgeon: Manya Silvas, MD;  Location: [facility] ENDOSCOPY;  Service: Endoscopy;  Laterality: N/A;   ESOPHAGOGASTRODUODENOSCOPY (EGD) WITH PROPOFOL N/A 06/10/2016   Procedure: ESOPHAGOGASTRODUODENOSCOPY (EGD) WITH PROPOFOL;  Surgeon: Manya Silvas, MD;  Location: [facility] ENDOSCOPY;  Service: Endoscopy;  Laterality: N/A;   FOOT SURGERY  02/07/1003   right foot benign tumor   SACROILIAC JOINT INJECTION  08/17/13 & 04/13/14   Dr. Sharlet Salina   SIGMOIDOSCOPY     UTERINE FIBROID SURGERY  1997     Current Outpatient Medications:    ALPRAZolam (XANAX) 0.25 MG tablet, Take 1 tablet (0.25 mg total) by mouth at bedtime as needed., Disp: 30 tablet, Rfl: 0   Apremilast (OTEZLA) 30 MG TABS, Take 30 mg by mouth daily., Disp: , Rfl:    B Complex-C (B-COMPLEX WITH VITAMIN C) tablet, Take 1 tablet by mouth daily., Disp: , Rfl:    cholecalciferol (VITAMIN D3) 25 MCG (1000 UNIT) tablet, Take 1,000 Units by mouth daily., Disp: , Rfl:    Crisaborole (EUCRISA) 2 % OINT, Apply 1 application topically 2 (two) times daily as needed (psoriasis)., Disp: 60 g, Rfl: 1   desonide (DESOWEN)  0.05 % cream, Apply 1 application topically 2 (two) times daily as needed (psoriasis)., Disp: , Rfl:    estradiol (ESTRACE) 0.1 MG/GM vaginal cream, Insert 1 gram vaginally 1-2 times weekly, Disp: 42.5 g, Rfl: 2   estradiol (VIVELLE-DOT) 0.025 MG/24HR, Place 1 patch onto the skin 2 (two) times a week., Disp: 24 patch, Rfl: 0   gabapentin (NEURONTIN) 300 MG capsule, Take 300 mg by mouth at bedtime., Disp: , Rfl:    RABEprazole (ACIPHEX) 20 MG tablet, TAKE ONE TABLET BY MOUTH EVERY DAY, Disp: 90 tablet, Rfl: 3   Tapinarof (VTAMA) 1 % CREA, Apply 1 application. topically daily. Qd to aa psoriasis, Disp: 60 g, Rfl: 6   triamcinolone cream (KENALOG) 0.1 %, Apply 1 Application topically 2 (two) times daily as needed. For up to 2 weeks. Avoid applying to face, groin, and axilla. Use as directed. Long-term use can cause thinning of the skin., Disp: 80 g, Rfl: 1  Allergies  Allergen Reactions   Erythromycin Ethylsuccinate [Erythromycin] Other (See Comments)    Unknown   Omnicef [Cefdinir] Hives and Itching     Varicose veins of leg with swelling, bilateral     PLAN: The patient's right lower extremity was sterilely prepped and draped. The ultrasound machine was used to visualize the saphenous vein throughout its course. A segment in the upper calf was selected for access. The saphenous  vein was accessed with minimal difficulty using ultrasound guidance with a micropuncture needle. A 0.018 wire was then placed beyond the saphenofemoral junction and the needle was removed. The 65 cm sheath was then placed over the wire and the wire and dilator were removed. The laser fiber was then placed through the sheath and its tip was placed approximately 4-5 centimeters below the saphenofemoral junction. Tumescent anesthesia was then created with a dilute lidocaine solution. Laser energy was then delivered with constant withdrawal of the sheath and laser fiber. Approximately 1125 joules of energy were delivered over a  length of 33 centimeters using a 1470 Hz VenaCure machine at 7 W. Sterile dressings were placed. The patient tolerated the procedure well without obvious complications.   Follow-up in 1 week with post-laser duplex.

## 2022-01-29 ENCOUNTER — Other Ambulatory Visit: Payer: Self-pay

## 2022-01-29 MED ORDER — OTEZLA 30 MG PO TABS: 30.0000 mg | ORAL_TABLET | Freq: Every day | ORAL | 1 refills | Status: DC

## 2022-01-29 NOTE — Progress Notes (Signed)
Otezla RFs to [facility]. aw

## 2022-01-31 ENCOUNTER — Other Ambulatory Visit (INDEPENDENT_AMBULATORY_CARE_PROVIDER_SITE_OTHER): Payer: Self-pay | Admitting: Nurse Practitioner

## 2022-01-31 ENCOUNTER — Other Ambulatory Visit (INDEPENDENT_AMBULATORY_CARE_PROVIDER_SITE_OTHER): Payer: Self-pay | Admitting: Vascular Surgery

## 2022-01-31 DIAGNOSIS — I83893 Varicose veins of bilateral lower extremities with other complications: Secondary | ICD-10-CM

## 2022-02-01 ENCOUNTER — Ambulatory Visit (INDEPENDENT_AMBULATORY_CARE_PROVIDER_SITE_OTHER): Payer: Medicare PPO

## 2022-02-01 DIAGNOSIS — I83893 Varicose veins of bilateral lower extremities with other complications: Secondary | ICD-10-CM

## 2022-02-04 ENCOUNTER — Encounter (INDEPENDENT_AMBULATORY_CARE_PROVIDER_SITE_OTHER): Payer: Self-pay

## 2022-03-05 DIAGNOSIS — G5602 Carpal tunnel syndrome, left upper limb: Secondary | ICD-10-CM | POA: Diagnosis not present

## 2022-03-07 ENCOUNTER — Telehealth (INDEPENDENT_AMBULATORY_CARE_PROVIDER_SITE_OTHER): Payer: Self-pay | Admitting: Vascular Surgery

## 2022-03-07 ENCOUNTER — Other Ambulatory Visit (INDEPENDENT_AMBULATORY_CARE_PROVIDER_SITE_OTHER): Payer: Medicare PPO

## 2022-03-07 DIAGNOSIS — R739 Hyperglycemia, unspecified: Secondary | ICD-10-CM | POA: Diagnosis not present

## 2022-03-07 DIAGNOSIS — M545 Low back pain, unspecified: Secondary | ICD-10-CM | POA: Diagnosis not present

## 2022-03-07 DIAGNOSIS — L4050 Arthropathic psoriasis, unspecified: Secondary | ICD-10-CM

## 2022-03-07 LAB — HEMOGLOBIN A1C: Hgb A1c MFr Bld: 5.9 % (ref 4.6–6.5)

## 2022-03-07 LAB — LIPID PANEL
Cholesterol: 174 mg/dL (ref 0–200)
HDL: 60 mg/dL (ref >39.00)
LDL Cholesterol: 100 mg/dL — ABNORMAL HIGH (ref 0–99)
NonHDL: 113.82
Total CHOL/HDL Ratio: 3
Triglycerides: 70 mg/dL (ref 0.0–149.0)
VLDL: 14 mg/dL (ref 0.0–40.0)

## 2022-03-07 LAB — BASIC METABOLIC PANEL
BUN: 15 mg/dL (ref 6–23)
CO2: 29 mEq/L (ref 19–32)
Calcium: 9.4 mg/dL (ref 8.4–10.5)
Chloride: 105 mEq/L (ref 96–112)
Creatinine, Ser: 0.81 mg/dL (ref 0.40–1.20)
GFR: 74.94 mL/min (ref >60.00)
Glucose, Bld: 88 mg/dL (ref 70–99)
Potassium: 4.2 mEq/L (ref 3.5–5.1)
Sodium: 142 mEq/L (ref 135–145)

## 2022-03-07 LAB — HEPATIC FUNCTION PANEL
ALT: 12 U/L (ref 0–35)
AST: 15 U/L (ref 0–37)
Albumin: 4.2 g/dL (ref 3.5–5.2)
Alkaline Phosphatase: 68 U/L (ref 39–117)
Bilirubin, Direct: 0.1 mg/dL (ref 0.0–0.3)
Total Bilirubin: 0.7 mg/dL (ref 0.2–1.2)
Total Protein: 6.5 g/dL (ref 6.0–8.3)

## 2022-03-07 LAB — TSH: TSH: 1 u[IU]/mL (ref 0.35–5.50)

## 2022-03-07 NOTE — Telephone Encounter (Signed)
LVM for pt TCB and let us know if the laser appt that was scheduled for 12.1.23 with Dr. Lucky Cowboy, is ok to be moved to 12.12.23 at 11:00. Dr. Lucky Cowboy has emergent surgery and is unable to keep the 12.1.23 appt. If pt can make the 12.12.23 appt, nothing further is needed. If pt can't make the appt ion 12.12.23, please R/S appt to something that works for her before the end of the year for the expiration date.

## 2022-03-08 ENCOUNTER — Other Ambulatory Visit (INDEPENDENT_AMBULATORY_CARE_PROVIDER_SITE_OTHER): Payer: Medicare PPO | Admitting: Vascular Surgery

## 2022-03-13 ENCOUNTER — Ambulatory Visit: Payer: Medicare PPO | Admitting: Internal Medicine

## 2022-03-13 ENCOUNTER — Ambulatory Visit (INDEPENDENT_AMBULATORY_CARE_PROVIDER_SITE_OTHER): Payer: Medicare PPO

## 2022-03-13 ENCOUNTER — Encounter: Payer: Self-pay | Admitting: Internal Medicine

## 2022-03-13 VITALS — BP 110/80 | HR 62 | Temp 98.3°F | Ht 60.0 in | Wt 135.4 lb

## 2022-03-13 DIAGNOSIS — L4050 Arthropathic psoriasis, unspecified: Secondary | ICD-10-CM

## 2022-03-13 DIAGNOSIS — F439 Reaction to severe stress, unspecified: Secondary | ICD-10-CM

## 2022-03-13 DIAGNOSIS — M79642 Pain in left hand: Secondary | ICD-10-CM

## 2022-03-13 DIAGNOSIS — K219 Gastro-esophageal reflux disease without esophagitis: Secondary | ICD-10-CM | POA: Diagnosis not present

## 2022-03-13 DIAGNOSIS — G5602 Carpal tunnel syndrome, left upper limb: Secondary | ICD-10-CM | POA: Diagnosis not present

## 2022-03-13 DIAGNOSIS — M5416 Radiculopathy, lumbar region: Secondary | ICD-10-CM

## 2022-03-13 DIAGNOSIS — R200 Anesthesia of skin: Secondary | ICD-10-CM | POA: Diagnosis not present

## 2022-03-13 DIAGNOSIS — R739 Hyperglycemia, unspecified: Secondary | ICD-10-CM | POA: Diagnosis not present

## 2022-03-13 NOTE — Assessment & Plan Note (Addendum)
The 10-year ASCVD risk score (Arnett DK, et al., 2019) is: 4.7%   Values used to calculate the score:     Age: 66 years     Sex: Female     Is Non-Hispanic African American: No     Diabetic: No     Tobacco smoker: No     Systolic Blood Pressure: 924 mmHg     Is BP treated: No     HDL Cholesterol: 60 mg/dL     Total Cholesterol: 174 mg/dL  Low carb diet and exercise.  Follow met b and A1c.  Also check lipid panel.
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
## 2022-03-14 ENCOUNTER — Telehealth (INDEPENDENT_AMBULATORY_CARE_PROVIDER_SITE_OTHER): Payer: Self-pay | Admitting: Vascular Surgery

## 2022-03-14 DIAGNOSIS — M545 Low back pain, unspecified: Secondary | ICD-10-CM | POA: Diagnosis not present

## 2022-03-14 NOTE — Telephone Encounter (Signed)
Patient cancelled laser. Wants to rsch in the new year. I  let her know you would call her to rsch after you redone her PA because it expires 04/07/2022 she is ok with that and will await your call

## 2022-03-15 ENCOUNTER — Encounter (INDEPENDENT_AMBULATORY_CARE_PROVIDER_SITE_OTHER): Payer: Medicare PPO

## 2022-03-17 ENCOUNTER — Encounter: Payer: Self-pay | Admitting: Internal Medicine
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
## 2022-03-17 NOTE — Assessment & Plan Note (Signed)
Overall appears to be handling things relatively well.
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
## 2022-03-17 NOTE — Assessment & Plan Note (Signed)
Followed by dermatology.  On otezla.

## 2022-03-17 NOTE — Assessment & Plan Note (Signed)
Acid reflux.  Increase aciphex to bid.  Follow.
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
## 2022-03-19 ENCOUNTER — Other Ambulatory Visit (INDEPENDENT_AMBULATORY_CARE_PROVIDER_SITE_OTHER): Payer: Medicare PPO | Admitting: Vascular Surgery

## 2022-03-20 DIAGNOSIS — M545 Low back pain, unspecified: Secondary | ICD-10-CM | POA: Diagnosis not present

## 2022-03-26 ENCOUNTER — Encounter (INDEPENDENT_AMBULATORY_CARE_PROVIDER_SITE_OTHER): Payer: Medicare PPO

## 2022-03-26 DIAGNOSIS — G5602 Carpal tunnel syndrome, left upper limb: Secondary | ICD-10-CM | POA: Diagnosis not present

## 2022-04-05 ENCOUNTER — Ambulatory Visit (INDEPENDENT_AMBULATORY_CARE_PROVIDER_SITE_OTHER): Payer: Medicare PPO | Admitting: Nurse Practitioner

## 2022-04-10 DIAGNOSIS — Z135 Encounter for screening for eye and ear disorders: Secondary | ICD-10-CM | POA: Diagnosis not present

## 2022-04-10 DIAGNOSIS — H52223 Regular astigmatism, bilateral: Secondary | ICD-10-CM | POA: Diagnosis not present

## 2022-04-10 DIAGNOSIS — H2513 Age-related nuclear cataract, bilateral: Secondary | ICD-10-CM | POA: Diagnosis not present

## 2022-04-10 DIAGNOSIS — H5213 Myopia, bilateral: Secondary | ICD-10-CM | POA: Diagnosis not present

## 2022-04-10 NOTE — Progress Notes (Deleted)
Referring Physician:  Duanne Guess, PA-C [place],  [place] [id number]  Primary Physician:  Einar Pheasant, MD  History of Present Illness: 04/10/2022 [name] is here today with a chief complaint of *** low back pain that radiates into the right lateral hip, anterior knee, anterior lower leg and into the big toe.   Duration: *** Location: *** Quality: ***severe, burning Severity: *** 10/10 Precipitating: aggravated by ***daily activities and walking Modifying factors: made better by ***nothing? Weakness: none Timing: ***constant Bowel/Bladder Dysfunction: none  Conservative measures:  Physical therapy: *** has [facility] in [facility] from (03/07/22 to 03/20/22) but wasn't discharged.  Multimodal medical therapy including regular antiinflammatories: *** gabapentin, hydrocodone Injections: *** has received epidural steroid injections 01/15/2022: Right L5-S1 and right S1 transforaminal ESI (dexamethasone 12 mg) 11/19/2021: Right L5-S1 and right S1 transforaminal ESI (good relief, dexamethasone 10 mg) 07/24/2021: Right L5-S1 and right S1 transforaminal ESI (over 90% relief) 01/23/2021: Left L5-S1 transforaminal ESI (moderate to good relief) 01/03/2021: Bilateral L5-S1 transforaminal ESI (good relief of right leg pain) 02/03/2017: Left L5-S1 transforaminal ESI (good relief) 12/26/2016: Left L5-S1 transforaminal ESI (good relief) 10/20/2014: Right L4-5 transforaminal ESI (good relief)   Past Surgery: ***denies  April Santos has ***no symptoms of cervical myelopathy.  The symptoms are causing a significant impact on the patient's life.   I have utilized the care everywhere function in epic to review the outside records available from external health systems.  Review of Systems:  A 10 point review of systems is negative, except for the pertinent positives and negatives detailed in the HPI.  Past Medical History: Past Medical  History:  Diagnosis Date   Arthritis    Colon polyps    COVID-19 2022   Disc disorder    Dysphagia    Family history of adverse reaction to anesthesia    mother nausea   GERD (gastroesophageal reflux disease)    History of uterine fibroid    Psoriasis     Past Surgical History: Past Surgical History:  Procedure Laterality Date   ABDOMINAL HYSTERECTOMY     BREAST SURGERY  1999   biopsy   carpal  2005   Tunnel right hand   CARPOMETACARPAL ([place]) FUSION OF THUMB Right 11/16/2020   Procedure: Right CARPOMETACARPAL [facility]) ARTHROPLASTY;  Surgeon: Hessie Knows, MD;  Location: [facility] ORS;  Service: Orthopedics;  Laterality: Right;   COLONOSCOPY WITH ESOPHAGOGASTRODUODENOSCOPY (EGD)     COLONOSCOPY WITH PROPOFOL N/A 06/10/2016   Procedure: COLONOSCOPY WITH PROPOFOL;  Surgeon: Manya Silvas, MD;  Location: [facility]COPY;  Service: Endoscopy;  Laterality: N/A;   ESOPHAGOGASTRODUODENOSCOPY (EGD) WITH PROPOFOL N/A 06/10/2016   Procedure: ESOPHAGOGASTRODUODENOSCOPY (EGD) WITH PROPOFOL;  Surgeon: Manya Silvas, MD;  Location: [facility] END[facility]COPY;  Service: Endoscopy;  Laterality: N/A;   FOOT SURGERY  02/07/1003   right foot benign tumor   SACROILIAC JOINT INJECTION  08/17/13 & 04/13/14   Dr. Sharlet Salina   SIGMOIDOSCOPY     UTERINE FIBROID SURGERY  1997    Allergies: Allergies as of 04/11/2022 - Review Complete 03/17/2022  Allergen Reaction Noted   Erythromycin ethylsuccinate [erythromycin] Other (See Comments) 04/28/2012   Omnicef [cefdinir] Hives and Itching 04/28/2012    Medications: No outpatient medications have been marked as taking for the 04/11/22 encounter (Appointment) with Meade Maw, MD.    Social History: Social History   Tobacco Use   Smoking status: Never   Smokeless tobacco: Never  Vaping Use   Vaping Use:  Never used  Substance Use Topics   Alcohol use: Yes    Alcohol/week: 1.0 standard drink of alcohol    Types: 1 Glasses of wine per week    Comment: bimonthly    Drug use: No    Family Medical History: Family History  Problem Relation Age of Onset   Breast cancer Mother        77/74   Heart disease Father    Arthritis Father    GER disease Father    Diabetes Brother    Diabetes Maternal Uncle    Colon cancer Neg Hx     Physical Examination: There were no vitals filed for this visit.  General: Patient is well developed, well nourished, calm, collected, and in no apparent distress. Attention to examination is appropriate.  Neck:   Supple.  Full range of motion.  Respiratory: Patient is breathing without any difficulty.   NEUROLOGICAL:     Awake, alert, oriented to person, place, and time.  Speech is clear and fluent.   Cranial Nerves: Pupils equal round and reactive to light.  Facial tone is symmetric.  Facial sensation is symmetric. Shoulder shrug is symmetric. Tongue protrusion is midline.  There is no pronator drift.  ROM of spine: full.    Strength: Side Biceps Triceps Deltoid Interossei Grip Wrist Ext. Wrist Flex.  R '5 5 5 5 5 5 5  '$ L '5 5 5 5 5 5 5   '$ Side Iliopsoas Quads Hamstring PF DF EHL  R '5 5 5 5 5 5  '$ L '5 5 5 5 5 5   '$ Reflexes are ***2+ and symmetric at the biceps, triceps, brachioradialis, patella and achilles.   Hoffman's is absent.   Bilateral upper and lower extremity sensation is intact to light touch.    No evidence of dysmetria noted.  Gait is normal.     Medical Decision Making  Imaging: ***  I have personally reviewed the images and agree with the above interpretation.  Assessment and Plan: April Santos is a pleasant 67 y.o. female with ***    Thank you for involving me in the care of this patient.      Chester K. Izora Ribas MD, [facility] Neurosurgery

## 2022-04-11 ENCOUNTER — Ambulatory Visit: Payer: Medicare PPO | Admitting: Neurosurgery

## 2022-04-16 ENCOUNTER — Ambulatory Visit (INDEPENDENT_AMBULATORY_CARE_PROVIDER_SITE_OTHER): Payer: Medicare PPO | Admitting: Nurse Practitioner

## 2022-04-22 DIAGNOSIS — R87610 Atypical squamous cells of undetermined significance on cytologic smear of cervix (ASC-US): Secondary | ICD-10-CM | POA: Insufficient documentation
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
## 2022-04-22 NOTE — Progress Notes (Signed)
PCP: Dale [place], MD   Chief Complaint  Patient presents with   Gynecologic Exam    No concerns    HPI:      April Santos is a 67 y.o. No obstetric history on file. whose LMP was Patient's last menstrual period was 02/26/2002., presents today for her Medicare annual examination.  Her menses are absent due to menopause/hyst for leio in her 53s; question if BSO. No PMB. Started on estradiol 1 mg after hyst, changed to vivelle dot 0.05 mg with Dr. Jerene Pitch 4/22 due to concern of high dose of estrogen, weaned off last yr. Has occas vasomotor sx, but also under stress/pain with RT hip issue.   Sex activity: single partner, contraception - post hysterectomy status. She does have vaginal dryness. Uses estrace vag crm once wkly and lubricants with mostly sx control. Wants to cont vag ERT.  Last Pap: 04/19/21 Reslts were ASCUS/neg HPV DNA; 03/20/20 Results were: low-grade squamous intraepithelial neoplasia (LGSIL - encompassing HPV,mild dysplasia,CIN I) /POS HPV DNA; pt without cx, no hx of CIN 2or 3. Will repeat pap today.  Hx of STDs: HPV  Last mammogram: 12/21/21 at [facility];  Results were: normal--routine follow-up in 12 months There is a FH of breast cancer in her mother twice, genetic testing not done. Pt doesn't meet Medicare guidelines for testing. There is no FH of ovarian cancer. The patient does occas do self-breast exams.  Colonoscopy: 2018 with Dr. Mechele Collin with 3 polyps; Repeat due after 10 years per pt.  DEXA 10/22 at [facility]; low bone mass in hip, normal spine  Tobacco use: The patient denies current or previous tobacco use. Alcohol use:  rare No drug use Exercise: moderately active before hip issues; seeing neuro today.  She does get adequate calcium and Vitamin D in her diet.  Labs with PCP.   Patient Active Problem List   Diagnosis Date Noted   ASCUS of cervix with negative high risk HPV 04/22/2022   Hand pain, left 03/13/2022   Right ankle pain 11/08/2021   Right  hip pain 07/07/2021   Varicose veins of leg with swelling, bilateral 07/07/2021   Post-menopausal atrophic vaginitis 04/19/2021   LGSIL Pap smear of vagina 04/19/2021   Family history of breast cancer 04/19/2021   Arthritis of carpometacarpal (CMC) joint of right thumb 11/26/2020   History of COVID-19 10/31/2020   Hyperglycemia 08/22/2020   Breast cancer screening 01/30/2020   Vitamin D deficiency 08/02/2018   Generalized osteoarthritis of hand 09/04/2017   Right hand pain 03/11/2016   Psoriatic arthritis (HCC) 02/11/2016   Left shoulder pain 02/05/2016   Low iron 06/05/2015   Skin lesion 06/05/2015   Current use of estrogen therapy 11/29/2014   HNP (herniated nucleus pulposus), lumbar 10/20/2014   Lumbar radiculopathy 10/20/2014   Leg pain, left 05/16/2014   Health care maintenance 05/16/2014   Stress 10/17/2013   Osteoarthritis of knee 09/15/2013   Sacroiliitis (HCC) 08/17/2013   Right knee pain 07/20/2013   Abnormal mammogram 06/12/2013   Toenail fungus 06/12/2013   Osteopenia 05/14/2013   Carpal tunnel syndrome 11/26/2012   Environmental allergies 05/03/2012   History of condyloma acuminatum 05/03/2012   GERD (gastroesophageal reflux disease) 05/03/2012    Past Surgical History:  Procedure Laterality Date   ABDOMINAL HYSTERECTOMY     BREAST SURGERY  1999   biopsy   carpal  2005   Tunnel right hand   CARPOMETACARPAL (CMC) FUSION OF THUMB Right 11/16/2020   Procedure: Right CARPOMETACARPAL (CMC)  ARTHROPLASTY;  Surgeon: Kennedy Bucker, MD;  Location: [facility] ORS;  Service: Orthopedics;  Laterality: Right;   COLONOSCOPY WITH ESOPHAGOGASTRODUODENOSCOPY (EGD)     COLONOSCOPY WITH PROPOFOL N/A 06/10/2016   Procedure: COLONOSCOPY WITH PROPOFOL;  Surgeon: Scot Jun, MD;  Location: [facility] ENDOSCOPY;  Service: Endoscopy;  Laterality: N/A;   ESOPHAGOGASTRODUODENOSCOPY (EGD) WITH PROPOFOL N/A 06/10/2016   Procedure: ESOPHAGOGASTRODUODENOSCOPY (EGD) WITH PROPOFOL;  Surgeon: Scot Jun, MD;  Location: [facility] ENDOSCOPY;  Service: Endoscopy;  Laterality: N/A;   FOOT SURGERY  02/07/1003   right foot benign tumor   SACROILIAC JOINT INJECTION  08/17/13 & 04/13/14   Dr. Yves Dill   SIGMOIDOSCOPY     UTERINE FIBROID SURGERY  1997    Family History  Problem Relation Age of Onset   Breast cancer Mother        37/74   Heart disease Father    Arthritis Father    GER disease Father    Diabetes Brother    Diabetes Maternal Uncle    Colon cancer Neg Hx     Social History   Socioeconomic History   Marital status: Significant Other    Spouse name: [name]   Number of children: 0   Years of education: Not on file   Highest education level: Not on file  Occupational History   Not on file  Tobacco Use   Smoking status: Never   Smokeless tobacco: Never  Vaping Use   Vaping Use: Never used  Substance and Sexual Activity   Alcohol use: Yes    Alcohol/week: 1.0 standard drink of alcohol    Types: 1 Glasses of wine per week    Comment: bimonthly   Drug use: No   Sexual activity: Yes    Birth control/protection: Surgical    Comment: Hysterectomy  Other Topics Concern   Not on file  Social History Narrative   [name] - POA   Social Determinants of Health   Financial Resource Strain: Low Risk  (07/05/2021)   Overall Financial Resource Strain (CARDIA)    Difficulty of Paying Living Expenses: Not hard at all  Food Insecurity: No Food Insecurity (07/05/2021)   Hunger Vital Sign    Worried About Running Out of Food in the Last Year: Never true    Ran Out of Food in the Last Year: Never true  Transportation Needs: No Transportation Needs (07/05/2021)   PRAPARE - Administrator, Civil Service (Medical): No    Lack of Transportation (Non-Medical): No  Physical Activity: Sufficiently Active (07/05/2021)   Exercise Vital Sign    Days of Exercise per Week: 3 days    Minutes of Exercise per Session: 60 min  Stress: No Stress Concern Present  (07/05/2021)   [employer] of Occupational Health - Occupational Stress Questionnaire    Feeling of Stress : Not at all  Social Connections: Unknown (07/05/2021)   Social Connection and Isolation Panel [NHANES]    Frequency of Communication with Friends and Family: Not on file    Frequency of Social Gatherings with Friends and Family: Not on file    Attends Religious Services: Not on file    Active Member of Clubs or Organizations: Not on file    Attends Banker Meetings: Not on file    Marital Status: Married  Intimate Partner Violence: Not At Risk (07/05/2021)   Humiliation, Afraid, Rape, and Kick questionnaire    Fear of Current or Ex-Partner: No    Emotionally Abused: No  Physically Abused: No    Sexually Abused: No     Current Outpatient Medications:    ALPRAZolam (XANAX) 0.25 MG tablet, Take 1 tablet (0.25 mg total) by mouth at bedtime as needed., Disp: 30 tablet, Rfl: 0   Apremilast (OTEZLA) 30 MG TABS, Take 1 tablet (30 mg total) by mouth daily., Disp: 30 tablet, Rfl: 1   B Complex-C (B-COMPLEX WITH VITAMIN C) tablet, Take 1 tablet by mouth daily., Disp: , Rfl:    cholecalciferol (VITAMIN D3) 25 MCG (1000 UNIT) tablet, Take 1,000 Units by mouth daily., Disp: , Rfl:    Crisaborole (EUCRISA) 2 % OINT, Apply 1 application topically 2 (two) times daily as needed (psoriasis)., Disp: 60 g, Rfl: 1   desonide (DESOWEN) 0.05 % cream, Apply 1 application topically 2 (two) times daily as needed (psoriasis)., Disp: , Rfl:    estradiol (ESTRACE VAGINAL) 0.1 MG/GM vaginal cream, Insert 1 gram vaginally 1-2 times weekly as maintenance, Disp: 42.5 g, Rfl: 2   gabapentin (NEURONTIN) 300 MG capsule, Take 600 mg by mouth at bedtime., Disp: , Rfl:    HYDROcodone-acetaminophen (NORCO/VICODIN) 5-325 MG tablet, Take by mouth as needed., Disp: , Rfl:    meloxicam (MOBIC) 15 MG tablet, Take by mouth., Disp: , Rfl:    RABEprazole (ACIPHEX) 20 MG tablet, TAKE ONE TABLET BY MOUTH EVERY  DAY, Disp: 90 tablet, Rfl: 3   Tapinarof (VTAMA) 1 % CREA, Apply 1 application. topically daily. Qd to aa psoriasis, Disp: 60 g, Rfl: 6   triamcinolone cream (KENALOG) 0.1 %, Apply 1 Application topically 2 (two) times daily as needed. For up to 2 weeks. Avoid applying to face, groin, and axilla. Use as directed. Long-term use can cause thinning of the skin., Disp: 80 g, Rfl: 1     ROS:  Review of Systems  Constitutional:  Negative for fatigue, fever and unexpected weight change.  Respiratory:  Negative for cough, shortness of breath and wheezing.   Cardiovascular:  Negative for chest pain, palpitations and leg swelling.  Gastrointestinal:  Negative for blood in stool, constipation, diarrhea, nausea and vomiting.  Endocrine: Negative for cold intolerance, heat intolerance and polyuria.  Genitourinary:  Negative for dyspareunia, dysuria, flank pain, frequency, genital sores, hematuria, menstrual problem, pelvic pain, urgency, vaginal bleeding, vaginal discharge and vaginal pain.  Musculoskeletal:  Positive for arthralgias. Negative for back pain, joint swelling and myalgias.  Skin:  Negative for rash.  Neurological:  Negative for dizziness, syncope, light-headedness, numbness and headaches.  Hematological:  Negative for adenopathy.  Psychiatric/Behavioral:  Negative for agitation, confusion, sleep disturbance and suicidal ideas. The patient is not nervous/anxious.    BREAST: No symptoms    Objective: BP 100/66   Ht 5\' 1"  (1.549 m)   Wt 138 lb (62.6 kg)   LMP 02/26/2002   BMI 26.07 kg/m    Physical Exam Constitutional:      Appearance: She is well-developed.  Genitourinary:     Vulva normal.     Genitourinary Comments: UTERUS/CX SURG REM     Right Labia: No rash, tenderness or lesions.    Left Labia: No tenderness, lesions or rash.    Vaginal cuff intact.    No vaginal discharge, erythema or tenderness.      Right Adnexa: not tender and no mass present.    Left Adnexa:  not tender and no mass present.    Cervix is absent.     Uterus is absent.  Breasts:    Right: No mass, nipple discharge, skin  change or tenderness.     Left: No mass, nipple discharge, skin change or tenderness.  Neck:     Thyroid: No thyromegaly.  Cardiovascular:     Rate and Rhythm: Normal rate and regular rhythm.     Heart sounds: Normal heart sounds. No murmur heard. Pulmonary:     Effort: Pulmonary effort is normal.     Breath sounds: Normal breath sounds.  Abdominal:     Palpations: Abdomen is soft.     Tenderness: There is no abdominal tenderness. There is no guarding.  Musculoskeletal:        General: Normal range of motion.     Cervical back: Normal range of motion.  Neurological:     General: No focal deficit present.     Mental Status: She is alert and oriented to person, place, and time.     Cranial Nerves: No cranial nerve deficit.  Skin:    General: Skin is warm and dry.  Psychiatric:        Mood and Affect: Mood normal.        Behavior: Behavior normal.        Thought Content: Thought content normal.        Judgment: Judgment normal.  Vitals reviewed.     Assessment/Plan:  Encounter for annual routine gynecological examination  Cervical cancer screening - Plan: Cytology - PAP  ASCUS of cervix with negative high risk HPV - Plan: Cytology - PAP; repeat pap today. Will f/u with results  Encounter for screening mammogram for malignant neoplasm of breast; pt to schedule mammo 9/24  Family history of breast cancer--pt doesn't meet Medicare genetic testing guidelines but is at increased risk due to FH. Cont to follow FH. Cont yearly mammos.   Vasomotor symptoms due to menopause---sx resolved, even off ERT. F/u prn.   Post-menopausal atrophic vaginitis - Plan: estradiol (ESTRACE VAGINAL) 0.1 MG/GM vaginal cream; Rx RF. Try coconut oil as lubricant, increase vag ERT to twice wkly prn. F/u prn.    Meds ordered this encounter  Medications   estradiol  (ESTRACE VAGINAL) 0.1 MG/GM vaginal cream    Sig: Insert 1 gram vaginally 1-2 times weekly as maintenance    Dispense:  42.5 g    Refill:  2    Order Specific Question:   Supervising Provider    Answer:   Waymon Budge           GYN counsel breast self exam, mammography screening, use and side effects of HRT, menopause, adequate intake of calcium and vitamin D, diet and exercise    F/U  Return in about 1 year (around 04/24/2023).  Maryon Kemnitz B. Tasman Zapata, PA-C 04/23/2022 3:04 PM

## 2022-04-23 ENCOUNTER — Other Ambulatory Visit (HOSPITAL_COMMUNITY)
Admission: RE | Admit: 2022-04-23 | Discharge: 2022-04-23 | Disposition: A | Discharge status: Home / Self Care | Payer: Medicare PPO | Source: Ambulatory Visit | Attending: Obstetrics and Gynecology | Admitting: Obstetrics and Gynecology

## 2022-04-23 ENCOUNTER — Encounter: Payer: Self-pay | Admitting: Neurosurgery

## 2022-04-23 ENCOUNTER — Ambulatory Visit (INDEPENDENT_AMBULATORY_CARE_PROVIDER_SITE_OTHER): Payer: Medicare PPO | Admitting: Obstetrics and Gynecology

## 2022-04-23 ENCOUNTER — Encounter: Payer: Self-pay | Admitting: Obstetrics and Gynecology

## 2022-04-23 ENCOUNTER — Ambulatory Visit: Payer: Medicare PPO | Admitting: Neurosurgery

## 2022-04-23 VITALS — BP 100/66 | Ht 61.0 in | Wt 138.0 lb

## 2022-04-23 VITALS — BP 135/88 | HR 65 | Ht 61.0 in | Wt 137.4 lb

## 2022-04-23 DIAGNOSIS — M5416 Radiculopathy, lumbar region: Secondary | ICD-10-CM | POA: Diagnosis not present

## 2022-04-23 DIAGNOSIS — M4316 Spondylolisthesis, lumbar region: Secondary | ICD-10-CM

## 2022-04-23 DIAGNOSIS — Z803 Family history of malignant neoplasm of breast: Secondary | ICD-10-CM | POA: Diagnosis not present

## 2022-04-23 DIAGNOSIS — M4310 Spondylolisthesis, site unspecified: Secondary | ICD-10-CM

## 2022-04-23 DIAGNOSIS — N951 Menopausal and female climacteric states: Secondary | ICD-10-CM

## 2022-04-23 DIAGNOSIS — Z79890 Hormone replacement therapy (postmenopausal): Secondary | ICD-10-CM

## 2022-04-23 DIAGNOSIS — Z01411 Encounter for gynecological examination (general) (routine) with abnormal findings: Secondary | ICD-10-CM

## 2022-04-23 DIAGNOSIS — Z01419 Encounter for gynecological examination (general) (routine) without abnormal findings: Secondary | ICD-10-CM

## 2022-04-23 DIAGNOSIS — G5602 Carpal tunnel syndrome, left upper limb: Secondary | ICD-10-CM | POA: Diagnosis not present

## 2022-04-23 DIAGNOSIS — Z1231 Encounter for screening mammogram for malignant neoplasm of breast: Secondary | ICD-10-CM

## 2022-04-23 DIAGNOSIS — Z124 Encounter for screening for malignant neoplasm of cervix: Secondary | ICD-10-CM

## 2022-04-23 DIAGNOSIS — Z1211 Encounter for screening for malignant neoplasm of colon: Secondary | ICD-10-CM

## 2022-04-23 DIAGNOSIS — R87610 Atypical squamous cells of undetermined significance on cytologic smear of cervix (ASC-US): Secondary | ICD-10-CM

## 2022-04-23 DIAGNOSIS — N952 Postmenopausal atrophic vaginitis: Secondary | ICD-10-CM | POA: Diagnosis not present

## 2022-04-23 MED ORDER — ESTRADIOL 0.1 MG/GM VA CREA: TOPICAL_CREAM | VAGINAL | 2 refills | Status: DC

## 2022-04-23 NOTE — Patient Instructions (Signed)
I value your feedback and you entrusting us with your care. If you get a [facility] patient survey, I would appreciate you taking the time to let us know about your experience today. Thank you! ? ? ?

## 2022-04-26 LAB — CYTOLOGY - PAP: Diagnosis: NEGATIVE

## 2022-05-07 ENCOUNTER — Ambulatory Visit: Payer: Medicare PPO | Admitting: Dermatology

## 2022-05-12 ENCOUNTER — Encounter: Payer: Self-pay | Admitting: Internal Medicine

## 2022-05-13 ENCOUNTER — Other Ambulatory Visit: Payer: Self-pay

## 2022-05-13 MED ORDER — RABEPRAZOLE SODIUM 20 MG PO TBEC: 20.0000 mg | DELAYED_RELEASE_TABLET | Freq: Every day | ORAL | 3 refills | Status: DC

## 2022-06-18 ENCOUNTER — Ambulatory Visit: Payer: Medicare PPO | Admitting: Dermatology

## 2022-07-02 DIAGNOSIS — M5416 Radiculopathy, lumbar region: Secondary | ICD-10-CM | POA: Diagnosis not present

## 2022-07-02 DIAGNOSIS — M5126 Other intervertebral disc displacement, lumbar region: Secondary | ICD-10-CM | POA: Diagnosis not present

## 2022-07-08 ENCOUNTER — Telehealth: Payer: Self-pay | Admitting: Internal Medicine

## 2022-07-08 NOTE — Telephone Encounter (Signed)
Copied from [place] [phone]. Topic: Medicare AWV >> Jul 08, 2022  2:02 PM Lollie Marrow wrote: Reason for CRM: Called patient to schedule Medicare Annual Wellness Visit (AWV). Left message for patient to call back and schedule Medicare Annual Wellness Visit (AWV).  Last date of AWV: 07/05/2021  Please schedule an AWVS appointment at any time with [place].  If any questions, please contact me at [phone].    Thank you,  [place] Direct dial  [phone]

## 2022-07-11 ENCOUNTER — Ambulatory Visit: Payer: Medicare PPO | Admitting: Dermatology

## 2022-07-11 ENCOUNTER — Other Ambulatory Visit (INDEPENDENT_AMBULATORY_CARE_PROVIDER_SITE_OTHER): Payer: Medicare PPO

## 2022-07-11 DIAGNOSIS — L4050 Arthropathic psoriasis, unspecified: Secondary | ICD-10-CM | POA: Diagnosis not present

## 2022-07-11 DIAGNOSIS — R739 Hyperglycemia, unspecified: Secondary | ICD-10-CM

## 2022-07-11 LAB — CBC WITH DIFFERENTIAL/PLATELET
Basophils Absolute: 0 10*3/uL (ref 0.0–0.1)
Basophils Relative: 0.4 % (ref 0.0–3.0)
Eosinophils Absolute: 0.1 10*3/uL (ref 0.0–0.7)
Eosinophils Relative: 2.8 % (ref 0.0–5.0)
HCT: 40.2 % (ref 36.0–46.0)
Hemoglobin: 13.6 g/dL (ref 12.0–15.0)
Lymphocytes Relative: 33.6 % (ref 12.0–46.0)
Lymphs Abs: 1.4 10*3/uL (ref 0.7–4.0)
MCHC: 34 g/dL (ref 30.0–36.0)
MCV: 86.5 fl (ref 78.0–100.0)
Monocytes Absolute: 0.6 10*3/uL (ref 0.1–1.0)
Monocytes Relative: 13.7 % — ABNORMAL HIGH (ref 3.0–12.0)
Neutro Abs: 2 10*3/uL (ref 1.4–7.7)
Neutrophils Relative %: 49.5 % (ref 43.0–77.0)
Platelets: 260 10*3/uL (ref 150.0–400.0)
RBC: 4.64 Mil/uL (ref 3.87–5.11)
RDW: 13.8 % (ref 11.5–15.5)
WBC: 4.1 10*3/uL (ref 4.0–10.5)

## 2022-07-11 LAB — HEPATIC FUNCTION PANEL
ALT: 21 U/L (ref 0–35)
AST: 18 U/L (ref 0–37)
Albumin: 4.2 g/dL (ref 3.5–5.2)
Alkaline Phosphatase: 81 U/L (ref 39–117)
Bilirubin, Direct: 0.1 mg/dL (ref 0.0–0.3)
Total Bilirubin: 0.7 mg/dL (ref 0.2–1.2)
Total Protein: 6.8 g/dL (ref 6.0–8.3)

## 2022-07-11 LAB — LIPID PANEL
Cholesterol: 183 mg/dL (ref 0–200)
HDL: 60 mg/dL (ref >39.00)
LDL Cholesterol: 94 mg/dL (ref 0–99)
NonHDL: 122.71
Total CHOL/HDL Ratio: 3
Triglycerides: 145 mg/dL (ref 0.0–149.0)
VLDL: 29 mg/dL (ref 0.0–40.0)

## 2022-07-11 LAB — BASIC METABOLIC PANEL
BUN: 15 mg/dL (ref 6–23)
CO2: 30 mEq/L (ref 19–32)
Calcium: 9.5 mg/dL (ref 8.4–10.5)
Chloride: 104 mEq/L (ref 96–112)
Creatinine, Ser: 0.73 mg/dL (ref 0.40–1.20)
GFR: 84.69 mL/min (ref >60.00)
Glucose, Bld: 96 mg/dL (ref 70–99)
Potassium: 4.2 mEq/L (ref 3.5–5.1)
Sodium: 141 mEq/L (ref 135–145)

## 2022-07-11 LAB — HEMOGLOBIN A1C: Hgb A1c MFr Bld: 6 % (ref 4.6–6.5)

## 2022-07-15 ENCOUNTER — Encounter: Payer: Self-pay | Admitting: Internal Medicine

## 2022-07-15 ENCOUNTER — Ambulatory Visit: Payer: Medicare PPO | Admitting: Internal Medicine

## 2022-07-15 VITALS — BP 124/70 | HR 78 | Temp 98.2°F | Resp 16 | Ht 61.5 in | Wt 142.0 lb

## 2022-07-15 DIAGNOSIS — F439 Reaction to severe stress, unspecified: Secondary | ICD-10-CM

## 2022-07-15 DIAGNOSIS — M5416 Radiculopathy, lumbar region: Secondary | ICD-10-CM

## 2022-07-15 DIAGNOSIS — R87622 Low grade squamous intraepithelial lesion on cytologic smear of vagina (LGSIL): Secondary | ICD-10-CM | POA: Diagnosis not present

## 2022-07-15 DIAGNOSIS — G5602 Carpal tunnel syndrome, left upper limb: Secondary | ICD-10-CM | POA: Diagnosis not present

## 2022-07-15 DIAGNOSIS — L4050 Arthropathic psoriasis, unspecified: Secondary | ICD-10-CM | POA: Diagnosis not present

## 2022-07-15 DIAGNOSIS — Z79899 Other long term (current) drug therapy: Secondary | ICD-10-CM | POA: Diagnosis not present

## 2022-07-15 DIAGNOSIS — K219 Gastro-esophageal reflux disease without esophagitis: Secondary | ICD-10-CM

## 2022-07-15 DIAGNOSIS — R739 Hyperglycemia, unspecified: Secondary | ICD-10-CM

## 2022-07-15 DIAGNOSIS — Z79818 Long term (current) use of other agents affecting estrogen receptors and estrogen levels: Secondary | ICD-10-CM
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
## 2022-07-17 ENCOUNTER — Encounter: Payer: Self-pay | Admitting: Internal Medicine

## 2022-07-17 NOTE — Telephone Encounter (Signed)
Please notify - I have heard back from ortho and she may be getting a call from them as well, but they had recommended me going ahead and letting Dr Myer Haff know and see about a work in appt with him.  I have left message with Dr Lucienne Capers office.

## 2022-07-17 NOTE — Telephone Encounter (Signed)
Patient is going to see Dr Marcell Barlow 4/30 per Dr Lorin Picket and was placed on cancellation list.

## 2022-07-17 NOTE — Telephone Encounter (Signed)
Pt aware of appt date and time. Unsure about ortho appt at this time.

## 2022-07-21 ENCOUNTER — Encounter: Payer: Self-pay | Admitting: Internal Medicine

## 2022-07-21 NOTE — Assessment & Plan Note (Signed)
Followed by gyn
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
## 2022-07-21 NOTE — Assessment & Plan Note (Signed)
The 10-year ASCVD risk score (Arnett DK, et al., 2019) is: 6.8%   Values used to calculate the score:     Age: 67 years     Sex: Female     Is Non-Hispanic African American: No     Diabetic: No     Tobacco smoker: No     Systolic Blood Pressure: 124 mmHg     Is BP treated: No     HDL Cholesterol: 60 mg/dL     Total Cholesterol: 183 mg/dL  Low carb diet and exercise.  Follow met b and A1c.  Also check lipid panel.

## 2022-07-21 NOTE — Assessment & Plan Note (Signed)
Seeing ortho.  S/p ESI.  Had f/u with ortho due to persistent pain.  Referred to PT and NSU. Evaluated by Dr Myer Haff 04/23/22 - referred for back pain and leg pain.  Was feeling better at that appt.  Has had PT and ESI's.  Anterolisthesis of L4 and L5 that could cause compression of the right L4 nerve root.  S/p ESI 07/02/22 - Dr Yves Dill. Increased pain.  Taking gabapentin and ibuprofen.  Does have to take hydrocodone and wants to get off this medication.  Can't exercise.  Aggravated - sitting.  Request earlier appt. Called ortho.  Recommended f/u with NSU.  Appt made.

## 2022-07-21 NOTE — Assessment & Plan Note (Signed)
Overall appears to be handling things relatively well. 

## 2022-07-21 NOTE — Assessment & Plan Note (Signed)
Continue aciphex. Follow.
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
## 2022-07-21 NOTE — Assessment & Plan Note (Signed)
Followed by dermatology.  On otezla.   

## 2022-07-23 ENCOUNTER — Encounter: Payer: Self-pay | Admitting: Neurosurgery

## 2022-07-23 ENCOUNTER — Ambulatory Visit: Payer: Medicare PPO | Admitting: Neurosurgery

## 2022-07-23 VITALS — BP 128/82 | Ht 61.5 in | Wt 143.2 lb

## 2022-07-23 DIAGNOSIS — G8929 Other chronic pain: Secondary | ICD-10-CM

## 2022-07-23 DIAGNOSIS — M4316 Spondylolisthesis, lumbar region: Secondary | ICD-10-CM

## 2022-07-23 DIAGNOSIS — M5441 Lumbago with sciatica, right side: Secondary | ICD-10-CM | POA: Diagnosis not present
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
## 2022-07-26 ENCOUNTER — Encounter (INDEPENDENT_AMBULATORY_CARE_PROVIDER_SITE_OTHER): Payer: Self-pay | Admitting: Vascular Surgery

## 2022-07-26 ENCOUNTER — Ambulatory Visit (INDEPENDENT_AMBULATORY_CARE_PROVIDER_SITE_OTHER): Payer: Medicare PPO | Admitting: Vascular Surgery

## 2022-07-26 VITALS — BP 122/83 | HR 72 | Resp 16 | Wt 144.4 lb

## 2022-07-26 DIAGNOSIS — I83893 Varicose veins of bilateral lower extremities with other complications: Secondary | ICD-10-CM | POA: Diagnosis not present

## 2022-07-26 NOTE — Progress Notes (Signed)
April Santos is a 67 y.o. female who presents with symptomatic venous reflux  Past Medical History:  Diagnosis Date   Arthritis    Colon polyps    COVID-19 2022   Disc disorder    Dysphagia    Family history of adverse reaction to anesthesia    mother nausea   GERD (gastroesophageal reflux disease)    History of uterine fibroid    Psoriasis     Past Surgical History:  Procedure Laterality Date   ABDOMINAL HYSTERECTOMY     BREAST SURGERY  1999   biopsy   carpal  2005   Tunnel right hand   CARPOMETACARPAL (CMC) FUSION OF THUMB Right 11/16/2020   Procedure: Right CARPOMETACARPAL [facility]) ARTHROPLASTY;  Surgeon: Kennedy Bucker, MD;  Location: [facility] ORS;  Service: Orthopedics;  Laterality: Right;   COLONOSCOPY WITH ESOPHAGOGASTRODUODENOSCOPY (EGD)     COLONOSCOPY WITH PROPOFOL N/A 06/10/2016   Procedure: COLONOSCOPY WITH PROPOFOL;  Surgeon: Scot Jun, MD;  Location: [facility] ENDOSCOPY;  Service: Endoscopy;  Laterality: N/A;   ESOPHAGOGASTRODUODENOSCOPY (EGD) WITH PROPOFOL N/A 06/10/2016   Procedure: ESOPHAGOGASTRODUODENOSCOPY (EGD) WITH PROPOFOL;  Surgeon: Scot Jun, MD;  Location: [facility] ENDOSCOPY;  Service: Endoscopy;  Laterality: N/A;   FOOT SURGERY  02/07/1003   right foot benign tumor   SACROILIAC JOINT INJECTION  08/17/13 & 04/13/14   Dr. Yves Dill   SIGMOIDOSCOPY     UTERINE FIBROID SURGERY  1997     Current Outpatient Medications:    ALPRAZolam (XANAX) 0.25 MG tablet, Take 1 tablet (0.25 mg total) by mouth at bedtime as needed., Disp: 30 tablet, Rfl: 0   Apremilast (OTEZLA) 30 MG TABS, Take 1 tablet (30 mg total) by mouth daily., Disp: 30 tablet, Rfl: 1   B Complex-C (B-COMPLEX WITH VITAMIN C) tablet, Take 1 tablet by mouth daily., Disp: , Rfl:    cholecalciferol (VITAMIN D3) 25 MCG (1000 UNIT) tablet, Take 1,000 Units by mouth daily., Disp: , Rfl:    Crisaborole (EUCRISA) 2 % OINT, Apply 1 application topically 2 (two) times daily as needed (psoriasis)., Disp: 60 g, Rfl:  1   desonide (DESOWEN) 0.05 % cream, Apply 1 application topically 2 (two) times daily as needed (psoriasis)., Disp: , Rfl:    estradiol (ESTRACE VAGINAL) 0.1 MG/GM vaginal cream, Insert 1 gram vaginally 1-2 times weekly as maintenance, Disp: 42.5 g, Rfl: 2   gabapentin (NEURONTIN) 400 MG capsule, Take 800 mg by mouth at bedtime., Disp: , Rfl:    HYDROcodone-acetaminophen (NORCO/VICODIN) 5-325 MG tablet, Take by mouth as needed., Disp: , Rfl:    RABEprazole (ACIPHEX) 20 MG tablet, Take 1 tablet (20 mg total) by mouth daily., Disp: 90 tablet, Rfl: 3   Tapinarof (VTAMA) 1 % CREA, Apply 1 application. topically daily. Qd to aa psoriasis, Disp: 60 g, Rfl: 6   triamcinolone cream (KENALOG) 0.1 %, Apply 1 Application topically 2 (two) times daily as needed. For up to 2 weeks. Avoid applying to face, groin, and axilla. Use as directed. Long-term use can cause thinning of the skin., Disp: 80 g, Rfl: 1  Allergies  Allergen Reactions   Erythromycin Ethylsuccinate [Erythromycin] Other (See Comments)    Unknown   Omnicef [Cefdinir] Hives and Itching     Varicose veins of leg with swelling, bilateral     PLAN: The patient's left lower extremity was sterilely prepped and draped. The ultrasound machine was used to visualize the saphenous vein throughout its course. A segment in the mid to upper calf was  selected for access. The saphenous vein was accessed without difficulty using ultrasound guidance with a micropuncture needle. A 0.018 wire was then placed beyond the saphenofemoral junction and the needle was removed. The 65 cm sheath was then placed over the wire and the wire and dilator were removed. The laser fiber was then placed through the sheath and its tip was placed approximately 5 centimeters below the saphenofemoral junction. Tumescent anesthesia was then created with a dilute lidocaine solution. Laser energy was then delivered with constant withdrawal of the sheath and laser fiber. Approximately  1309 joules of energy were delivered over a length of 31 centimeters using a 1470 Hz VenaCure machine at 7 W. Sterile dressings were placed. The patient tolerated the procedure well without obvious complications.   Follow-up in 1 week with post-laser duplex.

## 2022-07-30 ENCOUNTER — Ambulatory Visit (INDEPENDENT_AMBULATORY_CARE_PROVIDER_SITE_OTHER): Payer: Self-pay | Admitting: Dermatology

## 2022-07-30 VITALS — BP 121/75

## 2022-07-30 DIAGNOSIS — L988 Other specified disorders of the skin and subcutaneous tissue: Secondary | ICD-10-CM

## 2022-07-30 NOTE — Progress Notes (Signed)
   Follow-Up Visit   Subjective  April Santos is a 67 y.o. female who presents for the following: Botox for facial elastosis  The following portions of the chart were reviewed this encounter and updated as appropriate: medications, allergies, medical history  Review of Systems:  No other skin or systemic complaints except as noted in HPI or Assessment and Plan.  Objective  Well appearing patient in no apparent distress; mood and affect are within normal limits.  A focused examination was performed of the face.  Relevant physical exam findings are noted in the Assessment and Plan.  Injection map photo    Assessment & Plan    Facial Elastosis Botox 67.5 units injected as marked:  - Frown complex 37.5 units - Crow's feet 10 units each side - Brow lift 5 units each side  Location: frown complex, crow's feet, brow lift bil  Informed consent: Discussed risks (infection, pain, bleeding, bruising, swelling, allergic reaction, paralysis of nearby muscles, eyelid droop, double vision, neck weakness, difficulty breathing, headache, undesirable cosmetic result, and need for additional treatment) and benefits of the procedure, as well as the alternatives.  Informed consent was obtained.  Preparation: The area was cleansed with alcohol.  Procedure Details:  Botox was injected into the dermis with a 30-gauge needle. Pressure applied to any bleeding. Ice packs offered for swelling.  Lot Number:  [id number] Expiration:  07/2024  Total Units Injected:  67.5  Plan: Tylenol may be used for headache.  Allow 2 weeks before returning to clinic for additional dosing as needed. Patient will call for any problems.  Return for 3-4 wks botox f/u.  I, Ardis Rowan, RMA, am acting as scribe for [name], MD .   Documentation: I have reviewed the above documentation for accuracy and completeness, and I agree with the above.  [name], MD

## 2022-07-30 NOTE — Patient Instructions (Signed)
Due to recent changes in healthcare laws, you may see results of your pathology and/or laboratory studies on MyChart before the doctors have had a chance to review them. We understand that in some cases there may be results that are confusing or concerning to you. Please understand that not all results are received at the same time and often the doctors may need to interpret multiple results in order to provide you with the best plan of care or course of treatment. Therefore, we ask that you please give us 2 business days to thoroughly review all your results before contacting the office for clarification. Should we see a critical lab result, you will be contacted sooner.   If You Need Anything After Your Visit  If you have any questions or concerns for your doctor, please call our main line at [phone] and press option 4 to reach your doctor's medical assistant. If no one answers, please leave a voicemail as directed and we will return your call as soon as possible. Messages left after 4 pm will be answered the following business day.   You may also send us a message via MyChart. We typically respond to MyChart messages within 1-2 business days.  For prescription refills, please ask your pharmacy to contact our office. Our fax number is [phone].  If you have an urgent issue when the clinic is closed that cannot wait until the next business day, you can page your doctor at the number below.    Please note that while we do our best to be available for urgent issues outside of office hours, we are not available 24/7.   If you have an urgent issue and are unable to reach us, you may choose to seek medical care at your doctor's office, retail clinic, urgent care center, or emergency room.  If you have a medical emergency, please immediately call 911 or go to the emergency department.  Pager Numbers  - Dr. Kowalski: [phone]  - Dr. Moye: [phone]  - Dr. Stewart:  [phone]  In the event of inclement weather, please call our main line at [phone] for an update on the status of any delays or closures.  Dermatology Medication Tips: Please keep the boxes that topical medications come in in order to help keep track of the instructions about where and how to use these. Pharmacies typically print the medication instructions only on the boxes and not directly on the medication tubes.   If your medication is too expensive, please contact our office at [phone] option 4 or send us a message through MyChart.   We are unable to tell what your co-pay for medications will be in advance as this is different depending on your insurance coverage. However, we may be able to find a substitute medication at lower cost or fill out paperwork to get insurance to cover a needed medication.   If a prior authorization is required to get your medication covered by your insurance company, please allow us 1-2 business days to complete this process.  Drug prices often vary depending on where the prescription is filled and some pharmacies may offer cheaper prices.  The website www.goodrx.com contains coupons for medications through different pharmacies. The prices here do not account for what the cost may be with help from insurance (it may be cheaper with your insurance), but the website can give you the price if you did not use any insurance.  - You can print the associated coupon and take it with   your prescription to the pharmacy.  - You may also stop by our office during regular business hours and pick up a GoodRx coupon card.  - If you need your prescription sent electronically to a different pharmacy, notify our office through [facility] MyChart or by phone at [phone] option 4.     Si Usted Necesita Algo Despus de Su Visita  Tambin puede enviarnos un mensaje a travs de MyChart. Por lo general respondemos a los mensajes de MyChart en el transcurso de 1 a 2  das hbiles.  Para renovar recetas, por favor pida a su farmacia que se ponga en contacto con nuestra oficina. Nuestro nmero de fax es el [phone].  Si tiene un asunto urgente cuando la clnica est cerrada y que no puede esperar hasta el siguiente da hbil, puede llamar/localizar a su doctor(a) al nmero que aparece a continuacin.   Por favor, tenga en cuenta que aunque hacemos todo lo posible para estar disponibles para asuntos urgentes fuera del horario de oficina, no estamos disponibles las 24 horas del da, los 7 das de la semana.   Si tiene un problema urgente y no puede comunicarse con nosotros, puede optar por buscar atencin mdica  en el consultorio de su doctor(a), en una clnica privada, en un centro de atencin urgente o en una sala de emergencias.  Si tiene una emergencia mdica, por favor llame inmediatamente al 911 o vaya a la sala de emergencias.  Nmeros de bper  - Dr. Kowalski: [phone]  - Dra. Moye: [phone]  - Dra. Stewart: [phone]  En caso de inclemencias del tiempo, por favor llame a nuestra lnea principal al [phone] para una actualizacin sobre el estado de cualquier retraso o cierre.  Consejos para la medicacin en dermatologa: Por favor, guarde las cajas en las que vienen los medicamentos de uso tpico para ayudarle a seguir las instrucciones sobre dnde y cmo usarlos. Las farmacias generalmente imprimen las instrucciones del medicamento slo en las cajas y no directamente en los tubos del medicamento.   Si su medicamento es muy caro, por favor, pngase en contacto con nuestra oficina llamando al [phone] y presione la opcin 4 o envenos un mensaje a travs de MyChart.   No podemos decirle cul ser su copago por los medicamentos por adelantado ya que esto es diferente dependiendo de la cobertura de su seguro. Sin embargo, es posible que podamos encontrar un medicamento sustituto a menor costo o llenar un formulario para que el  seguro cubra el medicamento que se considera necesario.   Si se requiere una autorizacin previa para que su compaa de seguros cubra su medicamento, por favor permtanos de 1 a 2 das hbiles para completar este proceso.  Los precios de los medicamentos varan con frecuencia dependiendo del lugar de dnde se surte la receta y alguna farmacias pueden ofrecer precios ms baratos.  El sitio web www.goodrx.com tiene cupones para medicamentos de diferentes farmacias. Los precios aqu no tienen en cuenta lo que podra costar con la ayuda del seguro (puede ser ms barato con su seguro), pero el sitio web puede darle el precio si no utiliz ningn seguro.  - Puede imprimir el cupn correspondiente y llevarlo con su receta a la farmacia.  - Tambin puede pasar por nuestra oficina durante el horario de atencin regular y recoger una tarjeta de cupones de GoodRx.  - Si necesita que su receta se enve electrnicamente a una farmacia diferente, informe a nuestra oficina a travs de MyChart de [facility]   o por telfono llamando al [phone] y presione la opcin 4.  

## 2022-07-31 DIAGNOSIS — M5136 Other intervertebral disc degeneration, lumbar region: Secondary | ICD-10-CM | POA: Diagnosis not present

## 2022-07-31 DIAGNOSIS — M4316 Spondylolisthesis, lumbar region: Secondary | ICD-10-CM | POA: Diagnosis not present

## 2022-07-31 DIAGNOSIS — M47816 Spondylosis without myelopathy or radiculopathy, lumbar region: Secondary | ICD-10-CM | POA: Diagnosis not present

## 2022-07-31 DIAGNOSIS — M5416 Radiculopathy, lumbar region: Secondary | ICD-10-CM | POA: Diagnosis not present

## 2022-08-01 DIAGNOSIS — M5441 Lumbago with sciatica, right side: Secondary | ICD-10-CM | POA: Diagnosis not present

## 2022-08-01 DIAGNOSIS — G8929 Other chronic pain: Secondary | ICD-10-CM | POA: Diagnosis not present

## 2022-08-02 ENCOUNTER — Ambulatory Visit (INDEPENDENT_AMBULATORY_CARE_PROVIDER_SITE_OTHER): Payer: Medicare PPO

## 2022-08-02 ENCOUNTER — Other Ambulatory Visit (INDEPENDENT_AMBULATORY_CARE_PROVIDER_SITE_OTHER): Payer: Self-pay | Admitting: Vascular Surgery

## 2022-08-02 DIAGNOSIS — I83813 Varicose veins of bilateral lower extremities with pain: Secondary | ICD-10-CM

## 2022-08-06 ENCOUNTER — Encounter: Payer: Self-pay | Admitting: Dermatology

## 2022-08-06 ENCOUNTER — Ambulatory Visit: Payer: Medicare PPO | Admitting: Neurosurgery

## 2022-08-07 ENCOUNTER — Other Ambulatory Visit: Payer: Self-pay | Admitting: Internal Medicine

## 2022-08-07 ENCOUNTER — Encounter: Payer: Self-pay | Admitting: Internal Medicine

## 2022-08-07 ENCOUNTER — Ambulatory Visit
Admission: RE | Admit: 2022-08-07 | Discharge: 2022-08-07 | Disposition: A | Discharge status: Home / Self Care | Payer: Medicare PPO | Source: Ambulatory Visit | Attending: Neurosurgery | Admitting: Neurosurgery

## 2022-08-07 DIAGNOSIS — M48061 Spinal stenosis, lumbar region without neurogenic claudication: Secondary | ICD-10-CM | POA: Diagnosis not present

## 2022-08-07 DIAGNOSIS — G8929 Other chronic pain: Secondary | ICD-10-CM

## 2022-08-07 DIAGNOSIS — M5441 Lumbago with sciatica, right side: Secondary | ICD-10-CM | POA: Diagnosis not present

## 2022-08-07 DIAGNOSIS — M5126 Other intervertebral disc displacement, lumbar region: Secondary | ICD-10-CM | POA: Diagnosis not present

## 2022-08-07 DIAGNOSIS — M4316 Spondylolisthesis, lumbar region: Secondary | ICD-10-CM

## 2022-08-07 DIAGNOSIS — M545 Low back pain, unspecified: Secondary | ICD-10-CM | POA: Diagnosis not present

## 2022-08-07 NOTE — Telephone Encounter (Signed)
Rx for hydrocodone and gabapentin canceled.  These prescriptions are prescribed by another physician.  Pt requested to have these canceled.  She sent to wrong provider.

## 2022-08-15 ENCOUNTER — Encounter: Payer: Self-pay | Admitting: Neurosurgery

## 2022-08-15 ENCOUNTER — Ambulatory Visit: Payer: Medicare PPO | Admitting: Neurosurgery

## 2022-08-15 VITALS — BP 134/84 | Ht 61.5 in | Wt 142.4 lb

## 2022-08-15 DIAGNOSIS — M5441 Lumbago with sciatica, right side: Secondary | ICD-10-CM

## 2022-08-15 DIAGNOSIS — M4316 Spondylolisthesis, lumbar region: Secondary | ICD-10-CM | POA: Diagnosis not present

## 2022-08-15 DIAGNOSIS — G8929 Other chronic pain: Secondary | ICD-10-CM | POA: Diagnosis not present

## 2022-08-15 DIAGNOSIS — M25512 Pain in left shoulder: Secondary | ICD-10-CM
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
## 2022-08-20 ENCOUNTER — Ambulatory Visit: Payer: Medicare PPO | Admitting: Dermatology

## 2022-08-21 DIAGNOSIS — M5441 Lumbago with sciatica, right side: Secondary | ICD-10-CM | POA: Diagnosis not present

## 2022-08-21 DIAGNOSIS — G8929 Other chronic pain: Secondary | ICD-10-CM | POA: Diagnosis not present

## 2022-08-23 ENCOUNTER — Ambulatory Visit (INDEPENDENT_AMBULATORY_CARE_PROVIDER_SITE_OTHER): Payer: Medicare PPO | Admitting: Nurse Practitioner

## 2022-08-23 ENCOUNTER — Encounter (INDEPENDENT_AMBULATORY_CARE_PROVIDER_SITE_OTHER): Payer: Self-pay | Admitting: Nurse Practitioner

## 2022-08-23 VITALS — BP 136/86 | HR 69 | Resp 16 | Ht 61.0 in | Wt 144.0 lb

## 2022-08-23 DIAGNOSIS — I83813 Varicose veins of bilateral lower extremities with pain: Secondary | ICD-10-CM

## 2022-08-23 DIAGNOSIS — M5416 Radiculopathy, lumbar region: Secondary | ICD-10-CM | POA: Diagnosis not present

## 2022-08-23 NOTE — Progress Notes (Signed)
Subjective:    Patient ID: April Santos, female    DOB: Dec 04, 1955, 67 y.o.   MRN: [id number] Chief Complaint  Patient presents with   Follow-up    The patient returns to the office for followup status post laser ablation of the left great saphenous vein on 07/26/2022 and the right great saphenous vein on 01/25/2022.  The patient note significant improvement in the lower extremity pain but not resolution of the symptoms. The patient notes multiple residual varicosities bilaterally which continued to hurt with dependent positions and remained tender to palpation. The patient's swelling is minimally from preoperative status. The patient continues to wear graduated compression stockings on a daily basis but these are not eliminating the pain and discomfort. The patient continues to use over-the-counter anti-inflammatory medications to treat the pain and related symptoms but this has not given the patient relief. The patient notes the pain in the lower extremities is causing problems with daily exercise, problems at work and even with household activities such as preparing meals and doing dishes.  The patient is otherwise done well and there have been no complications related to the laser procedure or interval changes in the patient's overall   Post laser ultrasound shows successful ablation of the bilateral great saphenous veins.     Review of Systems  All other systems reviewed and are negative.      Objective:   Physical Exam Vitals reviewed.  HENT:     Head: Normocephalic.  Cardiovascular:     Rate and Rhythm: Normal rate.     Pulses: Normal pulses.  Pulmonary:     Effort: Pulmonary effort is normal.  Musculoskeletal:        General: Tenderness present.  Skin:    General: Skin is warm and dry.  Neurological:     Mental Status: She is alert and oriented to person, place, and time.  Psychiatric:        Mood and Affect: Mood normal.        Behavior: Behavior normal.         Thought Content: Thought content normal.        Judgment: Judgment normal.     BP 136/86 (BP Location: Left Arm)   Pulse 69   Resp 16   Ht 5\' 1"  (1.549 m)   Wt 144 lb (65.3 kg)   LMP 02/26/2002   BMI 27.21 kg/m   Past Medical History:  Diagnosis Date   Arthritis    Colon polyps    COVID-19 2022   Disc disorder    Dysphagia    Family history of adverse reaction to anesthesia    mother nausea   GERD (gastroesophageal reflux disease)    History of uterine fibroid    Psoriasis     Social History   Socioeconomic History   Marital status: Significant Other    Spouse name: [name]   Number of children: 0   Years of education: Not on file   Highest education level: Not on file  Occupational History   Not on file  Tobacco Use   Smoking status: Never   Smokeless tobacco: Never  Vaping Use   Vaping Use: Never used  Substance and Sexual Activity   Alcohol use: Yes    Alcohol/week: 1.0 standard drink of alcohol    Types: 1 Glasses of wine per week    Comment: bimonthly   Drug use: No   Sexual activity: Yes    Birth control/protection: Surgical  Comment: Hysterectomy  Other Topics Concern   Not on file  Social History Narrative   [name] - POA   Social Determinants of Health   Financial Resource Strain: Low Risk  (07/05/2021)   Overall Financial Resource Strain (CARDIA)    Difficulty of Paying Living Expenses: Not hard at all  Food Insecurity: No Food Insecurity (07/05/2021)   Hunger Vital Sign    Worried About Running Out of Food in the Last Year: Never true    Ran Out of Food in the Last Year: Never true  Transportation Needs: No Transportation Needs (07/05/2021)   PRAPARE - Administrator, Civil Service (Medical): No    Lack of Transportation (Non-Medical): No  Physical Activity: Sufficiently Active (07/05/2021)   Exercise Vital Sign    Days of Exercise per Week: 3 days    Minutes of Exercise per Session: 60 min  Stress: No Stress  Concern Present (07/05/2021)   [employer] of Occupational Health - Occupational Stress Questionnaire    Feeling of Stress : Not at all  Social Connections: Unknown (07/05/2021)   Social Connection and Isolation Panel [NHANES]    Frequency of Communication with Friends and Family: Not on file    Frequency of Social Gatherings with Friends and Family: Not on file    Attends Religious Services: Not on file    Active Member of Clubs or Organizations: Not on file    Attends Banker Meetings: Not on file    Marital Status: Married  Intimate Partner Violence: Not At Risk (07/05/2021)   Humiliation, Afraid, Rape, and Kick questionnaire    Fear of Current or Ex-Partner: No    Emotionally Abused: No    Physically Abused: No    Sexually Abused: No    Past Surgical History:  Procedure Laterality Date   ABDOMINAL HYSTERECTOMY     BREAST SURGERY  1999   biopsy   carpal  2005   Tunnel right hand   CARPOMETACARPAL ([facility]) FUSION OF THUMB Right 11/16/2020   Procedure: Right CARPOMETACARPAL [facility]) ARTHROPLASTY;  Surgeon: Kennedy Bucker, MD;  Location: [facility] ORS;  Service: Orthopedics;  Laterality: Right;   COLONOSCOPY WITH ESOPHAGOGASTRODUODENOSCOPY (EGD)     COLONOSCOPY WITH PROPOFOL N/A 06/10/2016   Procedure: COLONOSCOPY WITH PROPOFOL;  Surgeon: Scot Jun, MD;  Location: [facility] ENDOSCOPY;  Service: Endoscopy;  Laterality: N/A;   ESOPHAGOGASTRODUODENOSCOPY (EGD) WITH PROPOFOL N/A 06/10/2016   Procedure: ESOPHAGOGASTRODUODENOSCOPY (EGD) WITH PROPOFOL;  Surgeon: Scot Jun, MD;  Location: [facility] ENDOSCOPY;  Service: Endoscopy;  Laterality: N/A;   FOOT SURGERY  02/07/1003   right foot benign tumor   SACROILIAC JOINT INJECTION  08/17/13 & 04/13/14   Dr. Yves Dill   SIGMOIDOSCOPY     UTERINE FIBROID SURGERY  1997    Family History  Problem Relation Age of Onset   Breast cancer Mother        38/74   Heart disease Father    Arthritis Father    GER disease Father    Diabetes  Brother    Diabetes Maternal Uncle    Colon cancer Neg Hx     Allergies  Allergen Reactions   Erythromycin Ethylsuccinate [Erythromycin] Other (See Comments)    Unknown   Omnicef [Cefdinir] Hives and Itching       Latest Ref Rng & Units 07/11/2022    7:32 AM 07/03/2021    8:05 AM 09/06/2020    7:47 AM  CBC  WBC 4.0 - 10.5 K/uL 4.1  4.3  4.3   Hemoglobin 12.0 - 15.0 g/dL 02.7  25.3  66.4   Hematocrit 36.0 - 46.0 % 40.2  40.3  37.1   Platelets 150.0 - 400.0 K/uL 260.0  270.0  282.0       CMP     Component Value Date/Time   NA 141 07/11/2022 0732   K 4.2 07/11/2022 0732   CL 104 07/11/2022 0732   CO2 30 07/11/2022 0732   GLUCOSE 96 07/11/2022 0732   BUN 15 07/11/2022 0732   CREATININE 0.73 07/11/2022 0732   CALCIUM 9.5 07/11/2022 0732   PROT 6.8 07/11/2022 0732   ALBUMIN 4.2 07/11/2022 0732   AST 18 07/11/2022 0732   ALT 21 07/11/2022 0732   ALKPHOS 81 07/11/2022 0732   BILITOT 0.7 07/11/2022 0732     No results found.     Assessment & Plan:   1. Varicose veins of bilateral lower extremities with pain Recommend:  The patient has had successful ablation of the previously incompetent saphenous venous system but still has persistent symptoms of pain and swelling that are having a negative impact on daily life and daily activities.CEAP [id number].  Patient should undergo injection sclerotherapy to treat the residual varicosities.  The risks, benefits and alternative therapies were reviewed in detail with the patient.  All questions were answered.  The patient agrees to proceed with sclerotherapy at their convenience.  The patient will continue wearing the graduated compression stockings and using the over-the-counter pain medications to treat her symptoms.      2. Lumbar radiculopathy Currently being seen by Dr. Marcell Barlow with neurosurgery   Current Outpatient Medications on File Prior to Visit  Medication Sig Dispense Refill   ALPRAZolam (XANAX) 0.25 MG  tablet Take 1 tablet (0.25 mg total) by mouth at bedtime as needed. 30 tablet 0   Apremilast (OTEZLA) 30 MG TABS Take 1 tablet (30 mg total) by mouth daily. 30 tablet 1   B Complex-C (B-COMPLEX WITH VITAMIN C) tablet Take 1 tablet by mouth daily.     cholecalciferol (VITAMIN D3) 25 MCG (1000 UNIT) tablet Take 1,000 Units by mouth daily.     Crisaborole (EUCRISA) 2 % OINT Apply 1 application topically 2 (two) times daily as needed (psoriasis). 60 g 1   desonide (DESOWEN) 0.05 % cream Apply 1 application topically 2 (two) times daily as needed (psoriasis).     estradiol (ESTRACE VAGINAL) 0.1 MG/GM vaginal cream Insert 1 gram vaginally 1-2 times weekly as maintenance 42.5 g 2   gabapentin (NEURONTIN) 400 MG capsule Take 800 mg by mouth at bedtime.     HYDROcodone-acetaminophen (NORCO/VICODIN) 5-325 MG tablet Take by mouth as needed.     RABEprazole (ACIPHEX) 20 MG tablet Take 1 tablet (20 mg total) by mouth daily. 90 tablet 3   Tapinarof (VTAMA) 1 % CREA Apply 1 application. topically daily. Qd to aa psoriasis 60 g 6   triamcinolone cream (KENALOG) 0.1 % Apply 1 Application topically 2 (two) times daily as needed. For up to 2 weeks. Avoid applying to face, groin, and axilla. Use as directed. Long-term use can cause thinning of the skin. 80 g 1   No current facility-administered medications on file prior to visit.    There are no Patient Instructions on file for this visit. No follow-ups on file.   Georgiana Spinner, NP

## 2022-08-27 DIAGNOSIS — G8929 Other chronic pain: Secondary | ICD-10-CM | POA: Diagnosis not present

## 2022-08-27 DIAGNOSIS — M5441 Lumbago with sciatica, right side: Secondary | ICD-10-CM | POA: Diagnosis not present

## 2022-08-28 DIAGNOSIS — M75102 Unspecified rotator cuff tear or rupture of left shoulder, not specified as traumatic: Secondary | ICD-10-CM | POA: Diagnosis not present

## 2022-08-28 DIAGNOSIS — M19012 Primary osteoarthritis, left shoulder: Secondary | ICD-10-CM | POA: Diagnosis not present

## 2022-09-05 DIAGNOSIS — M5441 Lumbago with sciatica, right side: Secondary | ICD-10-CM | POA: Diagnosis not present

## 2022-09-14 ENCOUNTER — Encounter: Payer: Self-pay | Admitting: Internal Medicine

## 2022-09-16 NOTE — Telephone Encounter (Signed)
Scheduled for appt with Dr Lorin Picket

## 2022-09-17 ENCOUNTER — Encounter: Payer: Self-pay | Admitting: Internal Medicine

## 2022-09-17 ENCOUNTER — Ambulatory Visit: Payer: Medicare PPO | Admitting: Internal Medicine

## 2022-09-17 ENCOUNTER — Other Ambulatory Visit
Admission: RE | Admit: 2022-09-17 | Discharge: 2022-09-17 | Disposition: A | Discharge status: Home / Self Care | Payer: Medicare PPO | Attending: Internal Medicine | Admitting: Internal Medicine

## 2022-09-17 VITALS — BP 104/70 | HR 81 | Temp 98.0°F | Resp 16 | Ht 61.0 in | Wt 135.0 lb

## 2022-09-17 DIAGNOSIS — R739 Hyperglycemia, unspecified: Secondary | ICD-10-CM

## 2022-09-17 DIAGNOSIS — R197 Diarrhea, unspecified: Secondary | ICD-10-CM

## 2022-09-17 DIAGNOSIS — R1310 Dysphagia, unspecified: Secondary | ICD-10-CM

## 2022-09-17 DIAGNOSIS — L4050 Arthropathic psoriasis, unspecified: Secondary | ICD-10-CM

## 2022-09-17 DIAGNOSIS — K219 Gastro-esophageal reflux disease without esophagitis: Secondary | ICD-10-CM

## 2022-09-17 LAB — HEPATIC FUNCTION PANEL
ALT: 19 U/L (ref 0–35)
AST: 20 U/L (ref 0–37)
Albumin: 4.1 g/dL (ref 3.5–5.2)
Alkaline Phosphatase: 60 U/L (ref 39–117)
Bilirubin, Direct: 0.2 mg/dL (ref 0.0–0.3)
Total Bilirubin: 0.7 mg/dL (ref 0.2–1.2)
Total Protein: 6.7 g/dL (ref 6.0–8.3)

## 2022-09-17 LAB — CBC WITH DIFFERENTIAL/PLATELET
Basophils Absolute: 0 10*3/uL (ref 0.0–0.1)
Basophils Relative: 0.4 % (ref 0.0–3.0)
Eosinophils Absolute: 0 10*3/uL (ref 0.0–0.7)
Eosinophils Relative: 0.5 % (ref 0.0–5.0)
HCT: 39.1 % (ref 36.0–46.0)
Hemoglobin: 12.8 g/dL (ref 12.0–15.0)
Lymphocytes Relative: 26.4 % (ref 12.0–46.0)
Lymphs Abs: 1.3 10*3/uL (ref 0.7–4.0)
MCHC: 32.6 g/dL (ref 30.0–36.0)
MCV: 88.9 fl (ref 78.0–100.0)
Monocytes Absolute: 0.5 10*3/uL (ref 0.1–1.0)
Monocytes Relative: 9.8 % (ref 3.0–12.0)
Neutro Abs: 3 10*3/uL (ref 1.4–7.7)
Neutrophils Relative %: 62.9 % (ref 43.0–77.0)
Platelets: 272 10*3/uL (ref 150.0–400.0)
RBC: 4.4 Mil/uL (ref 3.87–5.11)
RDW: 14 % (ref 11.5–15.5)
WBC: 4.8 10*3/uL (ref 4.0–10.5)

## 2022-09-17 LAB — GASTROINTESTINAL PANEL BY PCR, STOOL (REPLACES STOOL CULTURE)

## 2022-09-17 LAB — BASIC METABOLIC PANEL
BUN: 15 mg/dL (ref 6–23)
CO2: 28 mEq/L (ref 19–32)
Calcium: 9.1 mg/dL (ref 8.4–10.5)
Chloride: 106 mEq/L (ref 96–112)
Creatinine, Ser: 0.77 mg/dL (ref 0.40–1.20)
GFR: 79.34 mL/min (ref >60.00)
Glucose, Bld: 94 mg/dL (ref 70–99)
Potassium: 3.6 mEq/L (ref 3.5–5.1)
Sodium: 141 mEq/L (ref 135–145)

## 2022-09-17 NOTE — Patient Instructions (Signed)
Benefiber - daily 

## 2022-09-17 NOTE — Telephone Encounter (Signed)
Discussed with patient. Advised that she does not follow the directions on the purple bag. She did fill the specimen tubes correctly with the stool and is [facility].

## 2022-09-17 NOTE — Progress Notes (Signed)
Subjective:    Patient ID: April Santos, female    DOB: 09-Jan-1956, 67 y.o.   MRN: [id number]  Patient here for  Chief Complaint  Patient presents with   Diarrhea    HPI Work in with concerns regarding persistent diarrhea and decreased appetite.  Has tried bland diet.  Trying to stay hydrated.  Things do not taste well to her.  No vomiting.  Has noticed dysphagia - with chicken.  Not eating any meat now. On aciphex bid.  Has had some increased acid reflux previously.  No increased abdominal pain.  Does have bowel urgency.  Has tried anti diarrheal medication.  Took imodium this weekend.  Noted some improvement.  Did have loose stool this am.  Manson Passey stool.  Noticed tarry stool over the weekend, but had taken pepto bismol.  Stool this am was brown.  No blood.  Colonoscopy 2018 - diverticulosis and internal hemorrhoids.  Has lost weight. Left shoulder is some better.  No hydrocodone last 2-3 weeks.    Past Medical History:  Diagnosis Date   Arthritis    Colon polyps    COVID-19 2022   Disc disorder    Dysphagia    Family history of adverse reaction to anesthesia    mother nausea   GERD (gastroesophageal reflux disease)    History of uterine fibroid    Psoriasis    Past Surgical History:  Procedure Laterality Date   ABDOMINAL HYSTERECTOMY     BREAST SURGERY  1999   biopsy   carpal  2005   Tunnel right hand   CARPOMETACARPAL ([facility]) FUSION OF THUMB Right 11/16/2020   Procedure: Right CARPOMETACARPAL [facility]) ARTHROPLASTY;  Surgeon: Kennedy Bucker, MD;  Location: [facility] ORS;  Service: Orthopedics;  Laterality: Right;   COLONOSCOPY WITH ESOPHAGOGASTRODUODENOSCOPY (EGD)     COLONOSCOPY WITH PROPOFOL N/A 06/10/2016   Procedure: COLONOSCOPY WITH PROPOFOL;  Surgeon: Scot Jun, MD;  Location: [facility] ENDOSCOPY;  Service: Endoscopy;  Laterality: N/A;   ESOPHAGOGASTRODUODENOSCOPY (EGD) WITH PROPOFOL N/A 06/10/2016   Procedure: ESOPHAGOGASTRODUODENOSCOPY (EGD) WITH PROPOFOL;  Surgeon: Scot Jun, MD;  Location: [facility] ENDOSCOPY;  Service: Endoscopy;  Laterality: N/A;   FOOT SURGERY  02/07/1003   right foot benign tumor   SACROILIAC JOINT INJECTION  08/17/13 & 04/13/14   Dr. Yves Dill   SIGMOIDOSCOPY     UTERINE FIBROID SURGERY  1997   Family History  Problem Relation Age of Onset   Breast cancer Mother        66/74   Heart disease Father    Arthritis Father    GER disease Father    Diabetes Brother    Diabetes Maternal Uncle    Colon cancer Neg Hx    Social History   Socioeconomic History   Marital status: Significant Other    Spouse name: [name]   Number of children: 0   Years of education: Not on file   Highest education level: Not on file  Occupational History   Not on file  Tobacco Use   Smoking status: Never   Smokeless tobacco: Never  Vaping Use   Vaping Use: Never used  Substance and Sexual Activity   Alcohol use: Yes    Alcohol/week: 1.0 standard drink of alcohol    Types: 1 Glasses of wine per week    Comment: bimonthly   Drug use: No   Sexual activity: Yes    Birth control/protection: Surgical    Comment: Hysterectomy  Other Topics Concern  Not on file  Social History Narrative   [name] - POA   Social Determinants of Health   Financial Resource Strain: Low Risk  (07/05/2021)   Overall Financial Resource Strain (CARDIA)    Difficulty of Paying Living Expenses: Not hard at all  Food Insecurity: No Food Insecurity (07/05/2021)   Hunger Vital Sign    Worried About Running Out of Food in the Last Year: Never true    Ran Out of Food in the Last Year: Never true  Transportation Needs: No Transportation Needs (07/05/2021)   PRAPARE - Administrator, Civil Service (Medical): No    Lack of Transportation (Non-Medical): No  Physical Activity: Sufficiently Active (07/05/2021)   Exercise Vital Sign    Days of Exercise per Week: 3 days    Minutes of Exercise per Session: 60 min  Stress: No Stress Concern Present (07/05/2021)    [employer] of Occupational Health - Occupational Stress Questionnaire    Feeling of Stress : Not at all  Social Connections: Unknown (07/05/2021)   Social Connection and Isolation Panel [NHANES]    Frequency of Communication with Friends and Family: Not on file    Frequency of Social Gatherings with Friends and Family: Not on file    Attends Religious Services: Not on file    Active Member of Clubs or Organizations: Not on file    Attends Banker Meetings: Not on file    Marital Status: Married     Review of Systems  Constitutional:  Negative for fever.       Decreased appetite.  Lost weight.  HENT:  Negative for congestion and sinus pressure.   Respiratory:  Negative for cough, chest tightness and shortness of breath.   Cardiovascular:  Negative for chest pain, palpitations and leg swelling.  Gastrointestinal:  Positive for diarrhea.       Decreased appetite.  Taste change.    Genitourinary:  Negative for difficulty urinating and dysuria.  Musculoskeletal:  Negative for joint swelling and myalgias.  Skin:  Negative for color change and rash.  Neurological:  Negative for dizziness and headaches.  Psychiatric/Behavioral:  Negative for agitation and dysphoric mood.        Objective:     BP 104/70   Pulse 81   Temp 98 F (36.7 C)   Resp 16   Ht 5\' 1"  (1.549 m)   Wt 135 lb (61.2 kg)   LMP 02/26/2002   SpO2 98%   BMI 25.51 kg/m  Wt Readings from Last 3 Encounters:  09/17/22 135 lb (61.2 kg)  08/23/22 144 lb (65.3 kg)  08/15/22 142 lb 6.4 oz (64.6 kg)    Physical Exam Vitals reviewed.  Constitutional:      General: She is not in acute distress.    Appearance: Normal appearance.  HENT:     Head: Normocephalic and atraumatic.     Right Ear: External ear normal.     Left Ear: External ear normal.  Eyes:     General: No scleral icterus.       Right eye: No discharge.        Left eye: No discharge.     Conjunctiva/sclera: Conjunctivae normal.   Neck:     Thyroid: No thyromegaly.  Cardiovascular:     Rate and Rhythm: Normal rate and regular rhythm.  Pulmonary:     Effort: No respiratory distress.     Breath sounds: Normal breath sounds. No wheezing.  Abdominal:  General: Bowel sounds are normal.     Palpations: Abdomen is soft.     Tenderness: There is no abdominal tenderness.  Musculoskeletal:        General: No swelling or tenderness.     Cervical back: Neck supple. No tenderness.  Lymphadenopathy:     Cervical: No cervical adenopathy.  Skin:    Findings: No erythema or rash.  Neurological:     Mental Status: She is alert.  Psychiatric:        Mood and Affect: Mood normal.        Behavior: Behavior normal.      Outpatient Encounter Medications as of 09/17/2022  Medication Sig   gabapentin (NEURONTIN) 400 MG capsule Take 800 mg by mouth 3 (three) times daily.   ALPRAZolam (XANAX) 0.25 MG tablet Take 1 tablet (0.25 mg total) by mouth at bedtime as needed.   Apremilast (OTEZLA) 30 MG TABS Take 1 tablet (30 mg total) by mouth daily.   B Complex-C (B-COMPLEX WITH VITAMIN C) tablet Take 1 tablet by mouth daily.   cholecalciferol (VITAMIN D3) 25 MCG (1000 UNIT) tablet Take 1,000 Units by mouth daily.   Crisaborole (EUCRISA) 2 % OINT Apply 1 application topically 2 (two) times daily as needed (psoriasis).   desonide (DESOWEN) 0.05 % cream Apply 1 application topically 2 (two) times daily as needed (psoriasis).   estradiol (ESTRACE VAGINAL) 0.1 MG/GM vaginal cream Insert 1 gram vaginally 1-2 times weekly as maintenance   HYDROcodone-acetaminophen (NORCO/VICODIN) 5-325 MG tablet Take by mouth as needed.   RABEprazole (ACIPHEX) 20 MG tablet Take 1 tablet (20 mg total) by mouth daily.   Tapinarof (VTAMA) 1 % CREA Apply 1 application. topically daily. Qd to aa psoriasis   triamcinolone cream (KENALOG) 0.1 % Apply 1 Application topically 2 (two) times daily as needed. For up to 2 weeks. Avoid applying to face, groin, and  axilla. Use as directed. Long-term use can cause thinning of the skin.   [DISCONTINUED] gabapentin (NEURONTIN) 400 MG capsule Take 800 mg by mouth at bedtime.   No facility-administered encounter medications on file as of 09/17/2022.     Lab Results  Component Value Date   WBC 4.8 09/17/2022   HGB 12.8 09/17/2022   HCT 39.1 09/17/2022   PLT 272.0 09/17/2022   GLUCOSE 94 09/17/2022   CHOL 183 07/11/2022   TRIG 145.0 07/11/2022   HDL 60.00 07/11/2022   LDLCALC 94 07/11/2022   ALT 19 09/17/2022   AST 20 09/17/2022   NA 141 09/17/2022   K 3.6 09/17/2022   CL 106 09/17/2022   CREATININE 0.77 09/17/2022   BUN 15 09/17/2022   CO2 28 09/17/2022   TSH 1.00 03/07/2022   HGBA1C 6.0 07/11/2022    MR LUMBAR SPINE WO CONTRAST  Result Date: 08/12/2022 CLINICAL DATA:  Chronic low back pain radiating into the right leg with burning in the right thigh. Symptoms have worsened over the past 4-5 months. EXAM: MRI LUMBAR SPINE WITHOUT CONTRAST TECHNIQUE: Multiplanar, multisequence MR imaging of the lumbar spine was performed. No intravenous contrast was administered. COMPARISON:  MRI lumbar spine 10/27/2020. FINDINGS: Segmentation:  Standard. Alignment: 0.7 cm facet mediated anterolisthesis L4 on L5 and convex left scoliosis are unchanged. Vertebrae: No fracture, evidence of discitis, or bone lesion. Scattered, mild degenerative endplate signal change is most notable at L4-5. Conus medullaris and cauda equina: Conus extends to the L1 level. Conus and cauda equina appear normal. Paraspinal and other soft tissues: 2 small left renal cysts  are unchanged. Disc levels: T11-12 is imaged in the sagittal plane only and negative. T12-L1: Negative. L1-2: Minimal disc bulge without stenosis, unchanged. L2-3: There is loss of disc space height with a shallow broad-based bulge. Mild to moderate foraminal narrowing is worse on the right. No change. L3-4: Loss of disc space height with a shallow bulge eccentric to the  right. Mild to moderate facet arthropathy is present. Mild left and moderately severe right foraminal narrowing. The central canal is open. No change. L4-5: Advanced facet degenerative change bilaterally. The disc is uncovered and bulging. Severe right foraminal narrowing is worse than on the prior exam. Mild to moderate central canal stenosis also shows some progression. Mild left foraminal narrowing is unchanged. L5-S1: There is a shallow disc bulge, left worse than right facet degenerative change is present. The central canal and right foramen are open. Mild to moderate left foraminal narrowing is unchanged. IMPRESSION: 1. Severe right foraminal narrowing at L4-5 is worse than on the prior exam. Mild to moderate central canal stenosis at L4-5 also shows some progression. 2. No change in mild to moderate foraminal narrowing at L2-3 which is worse on the right. 3. No change in moderately severe right foraminal narrowing at L3-4. 4. No change in mild to moderate left foraminal narrowing at L5-S1. Electronically Signed   By: Drusilla Kanner M.D.   On: 08/12/2022 08:30   DG Lumbar Spine Complete  Result Date: 08/11/2022 CLINICAL DATA:  back and leg pain EXAM: LUMBAR SPINE - COMPLETE 4+ VIEW COMPARISON:  MR lumbar spine 10/27/2020 FINDINGS: Slight levoconvex lumbar curvature. There is no evidence of lumbar spine fracture. There is unchanged degenerative grade 1 anterolisthesis at L4-L5. There is multilevel degenerative disc disease, moderate-severe from L2 through S1. There is moderate-severe lower lumbar predominant facet arthropathy. IMPRESSION: Multilevel degenerative disc disease, moderate-severe from L2 through S1. Moderate-severe lower lumbar predominant facet arthropathy. Unchanged degenerative grade 1 anterolisthesis at L4-L5. Electronically Signed   By: Caprice Renshaw M.D.   On: 08/11/2022 14:10       Assessment & Plan:  Diarrhea, unspecified type Assessment & Plan: Persistent diarrhea.  Decreased  appetite.  Weight loss.  Check cbc and metabolic panel.  Check stool studies to confirm no infectious etiology. Pending results, consider CT scan.  Also, discuss GI evaluation for question of need for EGD and colonoscopy.   Orders: -     CBC with Differential/Platelet -     Basic metabolic panel -     Hepatic function panel -     Ambulatory referral to Gastroenterology  Dysphagia, unspecified type Assessment & Plan: Has noticed some issues with chicken.  On aciphex.  Bland foods.  Advance slowly.  Check labs as outlined.  Continues on aciphex.  GI evaluation for question of need for EGD.   Orders: -     Ambulatory referral to Gastroenterology  Psoriatic arthritis [facility]) Assessment & Plan: Followed by dermatology.  On otezla.     Gastroesophageal reflux disease without esophagitis Assessment & Plan: Taking aciphex bid.    Hyperglycemia Assessment & Plan: Follow met b and A1c.       Dale Martinsburg, MD

## 2022-09-18 ENCOUNTER — Other Ambulatory Visit: Payer: Self-pay

## 2022-09-18 DIAGNOSIS — G8929 Other chronic pain: Secondary | ICD-10-CM | POA: Diagnosis not present

## 2022-09-18 DIAGNOSIS — R197 Diarrhea, unspecified: Secondary | ICD-10-CM

## 2022-09-18 DIAGNOSIS — M5441 Lumbago with sciatica, right side: Secondary | ICD-10-CM | POA: Diagnosis not present

## 2022-09-18 LAB — C DIFFICILE QUICK SCREEN W PCR REFLEX
C Diff antigen: NEGATIVE
C Diff interpretation: NOT DETECTED
C Diff toxin: NEGATIVE

## 2022-09-19 ENCOUNTER — Telehealth: Payer: Self-pay

## 2022-09-19 NOTE — Telephone Encounter (Signed)
-----   Message from Dale Kenilworth, MD sent at 09/19/2022  5:35 AM EDT ----- Please call and notify - c.diff stool test - negative.  Please confirm if still having diarrhea.

## 2022-09-22 ENCOUNTER — Telehealth: Payer: Self-pay | Admitting: Internal Medicine

## 2022-09-22 ENCOUNTER — Encounter: Payer: Self-pay | Admitting: Internal Medicine

## 2022-09-22 DIAGNOSIS — R197 Diarrhea, unspecified: Secondary | ICD-10-CM

## 2022-09-22 DIAGNOSIS — R1310 Dysphagia, unspecified: Secondary | ICD-10-CM | POA: Insufficient documentation

## 2022-09-22 NOTE — Assessment & Plan Note (Signed)
Taking aciphex bid.

## 2022-09-22 NOTE — Assessment & Plan Note (Signed)
Followed by dermatology.  On otezla.   

## 2022-09-22 NOTE — Assessment & Plan Note (Signed)
Persistent diarrhea.  Decreased appetite.  Weight loss.  Check cbc and metabolic panel.  Check stool studies to confirm no infectious etiology. Pending results, consider CT scan.  Also, discuss GI evaluation for question of need for EGD and colonoscopy.

## 2022-09-22 NOTE — Assessment & Plan Note (Signed)
Has noticed some issues with chicken.  On aciphex.  Bland foods.  Advance slowly.  Check labs as outlined.  Continues on aciphex.  GI evaluation for question of need for EGD.

## 2022-09-22 NOTE — Telephone Encounter (Signed)
Please call and confirm doing ok.  Stool studies were negative.  How are symptoms now?  Given weight loss, nausea, persistent diarrhea, I would like to order CT abd/pelvis to further evaluate.  Also would like to place order for referral - given symptoms above including dysphagia.

## 2022-09-22 NOTE — Assessment & Plan Note (Signed)
Follow met b and A1c.  

## 2022-09-23 NOTE — Telephone Encounter (Signed)
Patient says that diarrhea had resolved but then as of 3 days ago started back. No abd pain, no change in diet(eating bland foods as discussed), no new meds. Patient is aware GI referral placed and stated that she is agreeable to do CT scan. She is still doing the probiotic but wanted to know if she could use the imodium PRN as long as she does not use it regularly. She was able to take 1 dose of this last time and diarrhea resolved.

## 2022-09-23 NOTE — Telephone Encounter (Signed)
Have her try kaopectate instead of imodium, until we can confirm diagnosis.  Let us know if persistent problems.  Need back to order CT

## 2022-09-23 NOTE — Telephone Encounter (Signed)
Sending you back to order CT. She tried kaopectate previously and did not work. Going to try again this time and see if helps. She is doing benefiber and probiotic.

## 2022-09-24 ENCOUNTER — Telehealth: Payer: Self-pay | Admitting: Internal Medicine

## 2022-09-24 NOTE — Telephone Encounter (Signed)
Lft pt vm to call ofc to sch CT. thanks 

## 2022-09-24 NOTE — Telephone Encounter (Signed)
Left detailed message for patient. Advised to call back to office if any questions (I discussed this with her yesterday about continuing probiotic and benefiber)

## 2022-09-24 NOTE — Telephone Encounter (Signed)
Order placed for CT scan.  Continue probiotic and benefiber.  Please confirm eating and staying hydrated.  Let us know if worsening symptoms.

## 2022-09-24 NOTE — Addendum Note (Signed)
Addended by: Charm Barges on: 09/24/2022 07:04 AM   Modules accepted: Orders

## 2022-09-26 DIAGNOSIS — M5441 Lumbago with sciatica, right side: Secondary | ICD-10-CM | POA: Diagnosis not present

## 2022-09-26 DIAGNOSIS — G8929 Other chronic pain: Secondary | ICD-10-CM | POA: Diagnosis not present

## 2022-09-30 ENCOUNTER — Ambulatory Visit
Admission: RE | Admit: 2022-09-30 | Discharge: 2022-09-30 | Disposition: A | Discharge status: Home / Self Care | Payer: Medicare PPO | Source: Ambulatory Visit | Attending: Internal Medicine | Admitting: Internal Medicine

## 2022-09-30 DIAGNOSIS — K8020 Calculus of gallbladder without cholecystitis without obstruction: Secondary | ICD-10-CM | POA: Diagnosis not present

## 2022-09-30 DIAGNOSIS — R197 Diarrhea, unspecified: Secondary | ICD-10-CM | POA: Diagnosis not present

## 2022-09-30 MED ORDER — IOHEXOL 300 MG/ML  SOLN
100.0000 mL | Freq: Once | INTRAMUSCULAR | Status: AC | PRN
  Administered 2022-09-30: 100 mL via INTRAVENOUS

## 2022-10-01 ENCOUNTER — Encounter: Payer: Self-pay | Admitting: Neurosurgery

## 2022-10-01 ENCOUNTER — Ambulatory Visit: Payer: Medicare PPO | Admitting: Neurosurgery

## 2022-10-01 VITALS — BP 124/81 | Ht 61.0 in | Wt 135.0 lb

## 2022-10-01 DIAGNOSIS — M4316 Spondylolisthesis, lumbar region: Secondary | ICD-10-CM | POA: Diagnosis not present

## 2022-10-01 DIAGNOSIS — G8929 Other chronic pain: Secondary | ICD-10-CM | POA: Diagnosis not present

## 2022-10-01 DIAGNOSIS — M5441 Lumbago with sciatica, right side: Secondary | ICD-10-CM | POA: Diagnosis not present
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
## 2022-10-03 ENCOUNTER — Ambulatory Visit: Payer: Medicare PPO | Admitting: Dermatology

## 2022-10-03 ENCOUNTER — Encounter: Payer: Self-pay | Admitting: Dermatology

## 2022-10-03 VITALS — BP 113/78 | HR 76

## 2022-10-03 DIAGNOSIS — Z79899 Other long term (current) drug therapy: Secondary | ICD-10-CM | POA: Diagnosis not present

## 2022-10-03 DIAGNOSIS — Z7189 Other specified counseling: Secondary | ICD-10-CM

## 2022-10-03 DIAGNOSIS — L409 Psoriasis, unspecified: Secondary | ICD-10-CM

## 2022-10-03 NOTE — Progress Notes (Signed)
   Follow-Up Visit   Subjective  April Santos is a 67 y.o. female who presents for the following: Psoriasis 6 month psoriasis , patient reports she has improved while on otezla. Has no active flares. Denies any side effects while on otezla.   The following portions of the chart were reviewed this encounter and updated as appropriate: medications, allergies, medical history  Review of Systems:  No other skin or systemic complaints except as noted in HPI or Assessment and Plan.  Objective  Well appearing patient in no apparent distress; mood and affect are within normal limits.  Areas Examined: arms and legs  Relevant exam findings are noted in the Assessment and Plan.      Assessment & Plan    PSORIASIS Knees or elbows  Clear at exam  0% BSA.  Chronic condition with duration or expected duration over one year. Currently well-controlled.   No headache, depression, or upset stomach while on otezla   Treatment Plan:  Continue Otezla 30 mg tablet once a day  Side effects of Otezla (apremilast) include diarrhea, nausea, headache, upper respiratory infection, depression, and weight decrease (5-10%). It should only be taken by pregnant women after a discussion regarding risks and benefits with their doctor. Goal is control of skin condition, not cure.  The use of [place] requires long term medication management, including periodic office visits.    Continue Triamcinolone cream qd-bid for 2 weeks then stop for 2 weeks, alternating with Vtama Continue Vtama cream apply to affected skin daily for 2 weeks   Counseling on psoriasis and coordination of care  psoriasis is a chronic non-curable, but treatable genetic/hereditary disease that may have other systemic features affecting other organ systems such as joints (Psoriatic Arthritis). It is associated with an increased risk of inflammatory bowel disease, heart disease, non-alcoholic fatty liver disease, and depression.  Treatments  include light and laser treatments; topical medications; and systemic medications including oral and injectables.   Return in about 6 months (around 04/04/2023) for psoriasis.  IAsher Muir, CMA, am acting as scribe for [name], MD.   Documentation: I have reviewed the above documentation for accuracy and completeness, and I agree with the above.  [name], MD

## 2022-10-03 NOTE — Patient Instructions (Addendum)
Due to recent changes in healthcare laws, you may see results of your pathology and/or laboratory studies on MyChart before the doctors have had a chance to review them. We understand that in some cases there may be results that are confusing or concerning to you. Please understand that not all results are received at the same time and often the doctors may need to interpret multiple results in order to provide you with the best plan of care or course of treatment. Therefore, we ask that you please give us 2 business days to thoroughly review all your results before contacting the office for clarification. Should we see a critical lab result, you will be contacted sooner.   If You Need Anything After Your Visit  If you have any questions or concerns for your doctor, please call our main line at [phone] and press option 4 to reach your doctor's medical assistant. If no one answers, please leave a voicemail as directed and we will return your call as soon as possible. Messages left after 4 pm will be answered the following business day.   You may also send us a message via MyChart. We typically respond to MyChart messages within 1-2 business days.  For prescription refills, please ask your pharmacy to contact our office. Our fax number is [phone].  If you have an urgent issue when the clinic is closed that cannot wait until the next business day, you can page your doctor at the number below.    Please note that while we do our best to be available for urgent issues outside of office hours, we are not available 24/7.   If you have an urgent issue and are unable to reach us, you may choose to seek medical care at your doctor's office, retail clinic, urgent care center, or emergency room.  If you have a medical emergency, please immediately call 911 or go to the emergency department.  Pager Numbers  - Dr. Kowalski: [phone]  - Dr. Moye: [phone]  - Dr. Stewart:  [phone]  In the event of inclement weather, please call our main line at [phone] for an update on the status of any delays or closures.  Dermatology Medication Tips: Please keep the boxes that topical medications come in in order to help keep track of the instructions about where and how to use these. Pharmacies typically print the medication instructions only on the boxes and not directly on the medication tubes.   If your medication is too expensive, please contact our office at [phone] option 4 or send us a message through MyChart.   We are unable to tell what your co-pay for medications will be in advance as this is different depending on your insurance coverage. However, we may be able to find a substitute medication at lower cost or fill out paperwork to get insurance to cover a needed medication.   If a prior authorization is required to get your medication covered by your insurance company, please allow us 1-2 business days to complete this process.  Drug prices often vary depending on where the prescription is filled and some pharmacies may offer cheaper prices.  The website www.goodrx.com contains coupons for medications through different pharmacies. The prices here do not account for what the cost may be with help from insurance (it may be cheaper with your insurance), but the website can give you the price if you did not use any insurance.  - You can print the associated coupon and take it with   your prescription to the pharmacy.  - You may also stop by our office during regular business hours and pick up a GoodRx coupon card.  - If you need your prescription sent electronically to a different pharmacy, notify our office through [facility] MyChart or by phone at [phone] option 4.     Si Usted Necesita Algo Despus de Su Visita  Tambin puede enviarnos un mensaje a travs de MyChart. Por lo general respondemos a los mensajes de MyChart en el transcurso de 1 a 2  das hbiles.  Para renovar recetas, por favor pida a su farmacia que se ponga en contacto con nuestra oficina. Nuestro nmero de fax es el [phone].  Si tiene un asunto urgente cuando la clnica est cerrada y que no puede esperar hasta el siguiente da hbil, puede llamar/localizar a su doctor(a) al nmero que aparece a continuacin.   Por favor, tenga en cuenta que aunque hacemos todo lo posible para estar disponibles para asuntos urgentes fuera del horario de oficina, no estamos disponibles las 24 horas del da, los 7 das de la semana.   Si tiene un problema urgente y no puede comunicarse con nosotros, puede optar por buscar atencin mdica  en el consultorio de su doctor(a), en una clnica privada, en un centro de atencin urgente o en una sala de emergencias.  Si tiene una emergencia mdica, por favor llame inmediatamente al 911 o vaya a la sala de emergencias.  Nmeros de bper  - Dr. Kowalski: [phone]  - Dra. Moye: [phone]  - Dra. Stewart: [phone]  En caso de inclemencias del tiempo, por favor llame a nuestra lnea principal al [phone] para una actualizacin sobre el estado de cualquier retraso o cierre.  Consejos para la medicacin en dermatologa: Por favor, guarde las cajas en las que vienen los medicamentos de uso tpico para ayudarle a seguir las instrucciones sobre dnde y cmo usarlos. Las farmacias generalmente imprimen las instrucciones del medicamento slo en las cajas y no directamente en los tubos del medicamento.   Si su medicamento es muy caro, por favor, pngase en contacto con nuestra oficina llamando al [phone] y presione la opcin 4 o envenos un mensaje a travs de MyChart.   No podemos decirle cul ser su copago por los medicamentos por adelantado ya que esto es diferente dependiendo de la cobertura de su seguro. Sin embargo, es posible que podamos encontrar un medicamento sustituto a menor costo o llenar un formulario para que el  seguro cubra el medicamento que se considera necesario.   Si se requiere una autorizacin previa para que su compaa de seguros cubra su medicamento, por favor permtanos de 1 a 2 das hbiles para completar este proceso.  Los precios de los medicamentos varan con frecuencia dependiendo del lugar de dnde se surte la receta y alguna farmacias pueden ofrecer precios ms baratos.  El sitio web www.goodrx.com tiene cupones para medicamentos de diferentes farmacias. Los precios aqu no tienen en cuenta lo que podra costar con la ayuda del seguro (puede ser ms barato con su seguro), pero el sitio web puede darle el precio si no utiliz ningn seguro.  - Puede imprimir el cupn correspondiente y llevarlo con su receta a la farmacia.  - Tambin puede pasar por nuestra oficina durante el horario de atencin regular y recoger una tarjeta de cupones de GoodRx.  - Si necesita que su receta se enve electrnicamente a una farmacia diferente, informe a nuestra oficina a travs de MyChart de [place]   o por telfono llamando al [phone] y presione la opcin 4.  

## 2022-10-04 ENCOUNTER — Encounter: Payer: Self-pay | Admitting: Dermatology

## 2022-10-09 DIAGNOSIS — M5441 Lumbago with sciatica, right side: Secondary | ICD-10-CM | POA: Diagnosis not present

## 2022-10-09 DIAGNOSIS — G8929 Other chronic pain: Secondary | ICD-10-CM | POA: Diagnosis not present

## 2022-10-15 ENCOUNTER — Encounter (INDEPENDENT_AMBULATORY_CARE_PROVIDER_SITE_OTHER): Payer: Self-pay | Admitting: Nurse Practitioner

## 2022-10-15 ENCOUNTER — Ambulatory Visit (INDEPENDENT_AMBULATORY_CARE_PROVIDER_SITE_OTHER): Payer: Medicare PPO | Admitting: Nurse Practitioner

## 2022-10-15 VITALS — BP 135/82 | HR 64 | Resp 16 | Wt 133.0 lb

## 2022-10-15 DIAGNOSIS — I83893 Varicose veins of bilateral lower extremities with other complications: Secondary | ICD-10-CM

## 2022-10-15 NOTE — Progress Notes (Signed)
Varicose veins of bilateral  lower extremity with inflammation (454.1  I83.10) Current Plans   Indication: Patient presents with symptomatic varicose veins of the bilateral  lower extremity.   Procedure: Sclerotherapy using hypertonic saline mixed with 1% Lidocaine was performed on the bilateral lower extremity. Compression wraps were placed. The patient tolerated the procedure well. 

## 2022-10-18 DIAGNOSIS — M5441 Lumbago with sciatica, right side: Secondary | ICD-10-CM | POA: Diagnosis not present

## 2022-10-18 DIAGNOSIS — G8929 Other chronic pain: Secondary | ICD-10-CM | POA: Diagnosis not present

## 2022-10-22 DIAGNOSIS — G8929 Other chronic pain: Secondary | ICD-10-CM | POA: Diagnosis not present

## 2022-10-22 DIAGNOSIS — M5441 Lumbago with sciatica, right side: Secondary | ICD-10-CM | POA: Diagnosis not present

## 2022-11-12 ENCOUNTER — Other Ambulatory Visit (INDEPENDENT_AMBULATORY_CARE_PROVIDER_SITE_OTHER): Payer: Medicare PPO

## 2022-11-12 DIAGNOSIS — M5441 Lumbago with sciatica, right side: Secondary | ICD-10-CM | POA: Diagnosis not present

## 2022-11-12 DIAGNOSIS — R739 Hyperglycemia, unspecified: Secondary | ICD-10-CM | POA: Diagnosis not present

## 2022-11-12 DIAGNOSIS — G8929 Other chronic pain: Secondary | ICD-10-CM | POA: Diagnosis not present

## 2022-11-12 LAB — HEMOGLOBIN A1C: Hgb A1c MFr Bld: 5.8 % (ref 4.6–6.5)

## 2022-11-12 LAB — HEPATIC FUNCTION PANEL
ALT: 15 U/L (ref 0–35)
AST: 16 U/L (ref 0–37)
Albumin: 4.2 g/dL (ref 3.5–5.2)
Alkaline Phosphatase: 72 U/L (ref 39–117)
Bilirubin, Direct: 0.1 mg/dL (ref 0.0–0.3)
Total Bilirubin: 0.3 mg/dL (ref 0.2–1.2)
Total Protein: 6.9 g/dL (ref 6.0–8.3)

## 2022-11-12 LAB — LIPID PANEL
Cholesterol: 183 mg/dL (ref 0–200)
HDL: 62.9 mg/dL (ref >39.00)
LDL Cholesterol: 99 mg/dL (ref 0–99)
NonHDL: 119.82
Total CHOL/HDL Ratio: 3
Triglycerides: 103 mg/dL (ref 0.0–149.0)
VLDL: 20.6 mg/dL (ref 0.0–40.0)

## 2022-11-12 LAB — BASIC METABOLIC PANEL
BUN: 16 mg/dL (ref 6–23)
CO2: 27 mEq/L (ref 19–32)
Calcium: 9.7 mg/dL (ref 8.4–10.5)
Chloride: 105 mEq/L (ref 96–112)
Creatinine, Ser: 0.8 mg/dL (ref 0.40–1.20)
GFR: 75.7 mL/min (ref >60.00)
Glucose, Bld: 89 mg/dL (ref 70–99)
Potassium: 4 mEq/L (ref 3.5–5.1)
Sodium: 142 mEq/L (ref 135–145)

## 2022-11-15 ENCOUNTER — Ambulatory Visit (INDEPENDENT_AMBULATORY_CARE_PROVIDER_SITE_OTHER): Payer: Medicare PPO | Admitting: Internal Medicine

## 2022-11-15 ENCOUNTER — Ambulatory Visit (INDEPENDENT_AMBULATORY_CARE_PROVIDER_SITE_OTHER): Payer: Medicare PPO | Admitting: Nurse Practitioner

## 2022-11-15 VITALS — BP 124/77 | HR 61 | Resp 17 | Ht 61.0 in | Wt 137.6 lb

## 2022-11-15 VITALS — BP 118/70 | HR 72 | Temp 98.0°F | Resp 16 | Ht 61.0 in | Wt 137.0 lb

## 2022-11-15 DIAGNOSIS — R87622 Low grade squamous intraepithelial lesion on cytologic smear of vagina (LGSIL): Secondary | ICD-10-CM

## 2022-11-15 DIAGNOSIS — R197 Diarrhea, unspecified: Secondary | ICD-10-CM | POA: Diagnosis not present

## 2022-11-15 DIAGNOSIS — F439 Reaction to severe stress, unspecified: Secondary | ICD-10-CM

## 2022-11-15 DIAGNOSIS — Z0000 Encounter for general adult medical examination without abnormal findings: Secondary | ICD-10-CM

## 2022-11-15 DIAGNOSIS — L4050 Arthropathic psoriasis, unspecified: Secondary | ICD-10-CM

## 2022-11-15 DIAGNOSIS — R739 Hyperglycemia, unspecified: Secondary | ICD-10-CM

## 2022-11-15 DIAGNOSIS — Z9109 Other allergy status, other than to drugs and biological substances: Secondary | ICD-10-CM

## 2022-11-15 DIAGNOSIS — I83893 Varicose veins of bilateral lower extremities with other complications: Secondary | ICD-10-CM | POA: Diagnosis not present

## 2022-11-15 DIAGNOSIS — M25512 Pain in left shoulder: Secondary | ICD-10-CM

## 2022-11-15 DIAGNOSIS — K219 Gastro-esophageal reflux disease without esophagitis: Secondary | ICD-10-CM

## 2022-11-15 DIAGNOSIS — M5416 Radiculopathy, lumbar region: Secondary | ICD-10-CM

## 2022-11-15 NOTE — Progress Notes (Unsigned)
Subjective:    Patient ID: April Santos, female    DOB: 07/09/1955, 67 y.o.   MRN: [id number]  Patient here for  Chief Complaint  Patient presents with   Annual Exam    HPI Here for a physical exam. Last visit, she was having persistent diarrhea.  She has since been using benefiber and taking a probiotic.  Bowels are better. Has been seeing Dr Myer Haff - f/u right leg pain most c/w L5 distribution pain.  Has been doing therapy.  Feeling better.  No chest pain or sob reported. Increased left shoulder pain.  Due to get an injection soon.  No cough or congestion reported.  No abdominal pain.     Past Medical History:  Diagnosis Date   Arthritis    Colon polyps    COVID-19 2022   Disc disorder    Dysphagia    Family history of adverse reaction to anesthesia    mother nausea   GERD (gastroesophageal reflux disease)    History of uterine fibroid    Psoriasis    Past Surgical History:  Procedure Laterality Date   ABDOMINAL HYSTERECTOMY     BREAST SURGERY  1999   biopsy   carpal  2005   Tunnel right hand   CARPOMETACARPAL ([facility]) FUSION OF THUMB Right 11/16/2020   Procedure: Right CARPOMETACARPAL [facility]) ARTHROPLASTY;  Surgeon: Kennedy Bucker, MD;  Location: [facility] ORS;  Service: Orthopedics;  Laterality: Right;   COLONOSCOPY WITH ESOPHAGOGASTRODUODENOSCOPY (EGD)     COLONOSCOPY WITH PROPOFOL N/A 06/10/2016   Procedure: COLONOSCOPY WITH PROPOFOL;  Surgeon: Scot Jun, MD;  Location: [facility] ENDOSCOPY;  Service: Endoscopy;  Laterality: N/A;   ESOPHAGOGASTRODUODENOSCOPY (EGD) WITH PROPOFOL N/A 06/10/2016   Procedure: ESOPHAGOGASTRODUODENOSCOPY (EGD) WITH PROPOFOL;  Surgeon: Scot Jun, MD;  Location: [facility] ENDOSCOPY;  Service: Endoscopy;  Laterality: N/A;   FOOT SURGERY  02/07/1003   right foot benign tumor   SACROILIAC JOINT INJECTION  08/17/13 & 04/13/14   Dr. Yves Dill   SIGMOIDOSCOPY     UTERINE FIBROID SURGERY  1997   Family History  Problem Relation Age of Onset   Breast  cancer Mother        42/74   Heart disease Father    Arthritis Father    GER disease Father    Diabetes Brother    Diabetes Maternal Uncle    Colon cancer Neg Hx    Social History   Socioeconomic History   Marital status: Significant Other    Spouse name: [name]   Number of children: 0   Years of education: Not on file   Highest education level: Not on file  Occupational History   Not on file  Tobacco Use   Smoking status: Never   Smokeless tobacco: Never  Vaping Use   Vaping status: Never Used  Substance and Sexual Activity   Alcohol use: Yes    Alcohol/week: 1.0 standard drink of alcohol    Types: 1 Glasses of wine per week    Comment: bimonthly   Drug use: No   Sexual activity: Yes    Birth control/protection: Surgical    Comment: Hysterectomy  Other Topics Concern   Not on file  Social History Narrative   [name] - POA   Social Determinants of Health   Financial Resource Strain: Low Risk  (07/05/2021)   Overall Financial Resource Strain (CARDIA)    Difficulty of Paying Living Expenses: Not hard at all  Food Insecurity: No Food Insecurity (07/05/2021)  Hunger Vital Sign    Worried About Running Out of Food in the Last Year: Never true    Ran Out of Food in the Last Year: Never true  Transportation Needs: No Transportation Needs (07/05/2021)   PRAPARE - Administrator, Civil Service (Medical): No    Lack of Transportation (Non-Medical): No  Physical Activity: Sufficiently Active (07/05/2021)   Exercise Vital Sign    Days of Exercise per Week: 3 days    Minutes of Exercise per Session: 60 min  Stress: No Stress Concern Present (07/05/2021)   [employer] of Occupational Health - Occupational Stress Questionnaire    Feeling of Stress : Not at all  Social Connections: Unknown (07/05/2021)   Social Connection and Isolation Panel [NHANES]    Frequency of Communication with Friends and Family: Not on file    Frequency of Social  Gatherings with Friends and Family: Not on file    Attends Religious Services: Not on file    Active Member of Clubs or Organizations: Not on file    Attends Banker Meetings: Not on file    Marital Status: Married     Review of Systems  Constitutional:  Negative for unexpected weight change.  HENT:  Negative for congestion, sinus pressure and sore throat.   Eyes:  Negative for pain and visual disturbance.  Respiratory:  Negative for cough, chest tightness and shortness of breath.   Cardiovascular:  Negative for chest pain, palpitations and leg swelling.  Gastrointestinal:  Negative for abdominal pain, diarrhea, nausea and vomiting.  Genitourinary:  Negative for difficulty urinating and dysuria.  Musculoskeletal:  Negative for joint swelling and myalgias.       Right leg pain as outlined.  Left shoulder pain.   Skin:  Negative for color change and rash.  Neurological:  Negative for dizziness and headaches.  Hematological:  Negative for adenopathy. Does not bruise/bleed easily.  Psychiatric/Behavioral:  Negative for agitation and dysphoric mood.    `    Objective:     BP 118/70   Pulse 72   Temp 98 F (36.7 C)   Resp 16   Ht 5\' 1"  (1.549 m)   Wt 137 lb (62.1 kg)   LMP 02/26/2002   SpO2 98%   BMI 25.89 kg/m  Wt Readings from Last 3 Encounters:  11/15/22 137 lb (62.1 kg)  11/15/22 137 lb 9.6 oz (62.4 kg)  10/15/22 133 lb (60.3 kg)    Physical Exam Vitals reviewed.  Constitutional:      General: She is not in acute distress.    Appearance: Normal appearance.  HENT:     Head: Normocephalic and atraumatic.     Right Ear: External ear normal.     Left Ear: External ear normal.  Eyes:     General: No scleral icterus.       Right eye: No discharge.        Left eye: No discharge.     Conjunctiva/sclera: Conjunctivae normal.  Neck:     Thyroid: No thyromegaly.  Cardiovascular:     Rate and Rhythm: Normal rate and regular rhythm.  Pulmonary:      Effort: No respiratory distress.     Breath sounds: Normal breath sounds. No wheezing.  Abdominal:     General: Bowel sounds are normal.     Palpations: Abdomen is soft.     Tenderness: There is no abdominal tenderness.  Musculoskeletal:        General: No swelling  or tenderness.     Cervical back: Neck supple. No tenderness.  Lymphadenopathy:     Cervical: No cervical adenopathy.  Skin:    Findings: No erythema or rash.  Neurological:     Mental Status: She is alert.  Psychiatric:        Mood and Affect: Mood normal.        Behavior: Behavior normal.      Outpatient Encounter Medications as of 11/15/2022  Medication Sig   ALPRAZolam (XANAX) 0.25 MG tablet Take 1 tablet (0.25 mg total) by mouth at bedtime as needed.   Apremilast (OTEZLA) 30 MG TABS Take 1 tablet (30 mg total) by mouth daily.   B Complex-C (B-COMPLEX WITH VITAMIN C) tablet Take 1 tablet by mouth daily.   cholecalciferol (VITAMIN D3) 25 MCG (1000 UNIT) tablet Take 1,000 Units by mouth daily.   Crisaborole (EUCRISA) 2 % OINT Apply 1 application topically 2 (two) times daily as needed (psoriasis).   desonide (DESOWEN) 0.05 % cream Apply 1 application topically 2 (two) times daily as needed (psoriasis).   estradiol (ESTRACE VAGINAL) 0.1 MG/GM vaginal cream Insert 1 gram vaginally 1-2 times weekly as maintenance   gabapentin (NEURONTIN) 400 MG capsule Take 800 mg by mouth 2 (two) times daily.   HYDROcodone-acetaminophen (NORCO/VICODIN) 5-325 MG tablet Take by mouth as needed.   RABEprazole (ACIPHEX) 20 MG tablet Take 1 tablet (20 mg total) by mouth daily.   Tapinarof (VTAMA) 1 % CREA Apply 1 application. topically daily. Qd to aa psoriasis   triamcinolone cream (KENALOG) 0.1 % Apply 1 Application topically 2 (two) times daily as needed. For up to 2 weeks. Avoid applying to face, groin, and axilla. Use as directed. Long-term use can cause thinning of the skin.   No facility-administered encounter medications on file as of  11/15/2022.     Lab Results  Component Value Date   WBC 4.8 09/17/2022   HGB 12.8 09/17/2022   HCT 39.1 09/17/2022   PLT 272.0 09/17/2022   GLUCOSE 89 11/12/2022   CHOL 183 11/12/2022   TRIG 103.0 11/12/2022   HDL 62.90 11/12/2022   LDLCALC 99 11/12/2022   ALT 15 11/12/2022   AST 16 11/12/2022   NA 142 11/12/2022   K 4.0 11/12/2022   CL 105 11/12/2022   CREATININE 0.80 11/12/2022   BUN 16 11/12/2022   CO2 27 11/12/2022   TSH 1.00 03/07/2022   HGBA1C 5.8 11/12/2022    CT Abdomen Pelvis W Contrast  Result Date: 10/04/2022 CLINICAL DATA:  Persistent diarrhea EXAM: CT ABDOMEN AND PELVIS WITH CONTRAST TECHNIQUE: Multidetector CT imaging of the abdomen and pelvis was performed using the standard protocol following bolus administration of intravenous contrast. RADIATION DOSE REDUCTION: This exam was performed according to the departmental dose-optimization program which includes automated exposure control, adjustment of the mA and/or kV according to patient size and/or use of iterative reconstruction technique. CONTRAST:  OMNIPAQUE IOHEXOL 300 MG/ML  SOLN COMPARISON:  None Available. FINDINGS: Lower chest: No acute abnormality. Hepatobiliary: 2 cm gallstone within the gallbladder fundus. No biliary ductal dilatation or focal hepatic abnormality. Pancreas: No focal abnormality or ductal dilatation. Spleen: No focal abnormality.  Normal size. Adrenals/Urinary Tract: No adrenal abnormality. No focal renal abnormality. No stones or hydronephrosis. Urinary bladder is unremarkable. Stomach/Bowel: Stomach, large and small bowel grossly unremarkable. Vascular/Lymphatic: No evidence of aneurysm or adenopathy. Reproductive: Prior hysterectomy.  No adnexal masses. Other: No free fluid or free air. Musculoskeletal: No acute bony abnormality. Degenerative disc and  facet disease throughout the lumbar spine. 7 mm anterolisthesis of L4 on L5 related to facet disease. IMPRESSION: Cholelithiasis.  No CT  evidence of acute cholecystitis. No acute findings in the abdomen or pelvis. Electronically Signed   By: Charlett Nose M.D.   On: 10/04/2022 22:00       Assessment & Plan:  Routine general medical examination at a health care facility  Hyperglycemia Assessment & Plan: The 10-year ASCVD risk score (Arnett DK, et al., 2019) is: 6.1%   Values used to calculate the score:     Age: 22 years     Sex: Female     Is Non-Hispanic African American: No     Diabetic: No     Tobacco smoker: No     Systolic Blood Pressure: 118 mmHg     Is BP treated: No     HDL Cholesterol: 62.9 mg/dL     Total Cholesterol: 183 mg/dL  Low carb diet and exercise.  Follow met b and A1c.    Diarrhea, unspecified type Assessment & Plan: Not a significant issue for her now.  Benefiber and probiotics.  Follow.    Environmental allergies Assessment & Plan: Followed by Dr [place] Callas.    Gastroesophageal reflux disease without esophagitis Assessment & Plan: Controlled on aciphex.    Health care maintenance Assessment & Plan: Physical today 11/15/22. Breast and pelvic exam through gyn.  Mammogram 12/21/21- Birads II.  colonosocpy 06/2016.  Recommended f/u in 10 years.     Acute pain of left shoulder Assessment & Plan: Planning f/u with ortho - injection.    LGSIL Pap smear of vagina Assessment & Plan: Saw gyn 04/23/22 - PAP - ok.  Recommended repeat next year.     Lumbar radiculopathy Assessment & Plan: Seeing ortho.  S/p ESI.  Had f/u with ortho due to persistent pain.  Referred to PT and [facility]. Evaluated by Dr Myer Haff 04/23/22 - referred for back pain and leg pain.  Was feeling better at that appt.  Has had PT and ESI's.  Anterolisthesis of L4 and L5 that could cause compression of the right L4 nerve root.  S/p ESI 07/02/22 - Dr Yves Dill. Has been seeing Dr Myer Haff - f/u right leg pain most c/w L5 distribution pain.  Has been doing therapy.  Feeling better.     Psoriatic arthritis (HCC) Assessment &  Plan: Followed by dermatology.  On otezla.     Stress Assessment & Plan: Overall appears to be handling things relatively well.      Dale [place], MD

## 2022-11-15 NOTE — Assessment & Plan Note (Addendum)
The 10-year ASCVD risk score (Arnett DK, et al., 2019) is: 6.1%   Values used to calculate the score:     Age: 67 years     Sex: Female     Is Non-Hispanic African American: No     Diabetic: No     Tobacco smoker: No     Systolic Blood Pressure: 118 mmHg     Is BP treated: No     HDL Cholesterol: 62.9 mg/dL     Total Cholesterol: 183 mg/dL  Low carb diet and exercise.  Follow met b and A1c.

## 2022-11-16 ENCOUNTER — Encounter (INDEPENDENT_AMBULATORY_CARE_PROVIDER_SITE_OTHER): Payer: Self-pay | Admitting: Nurse Practitioner

## 2022-11-16 NOTE — Progress Notes (Signed)
Varicose veins of bilateral  lower extremity with inflammation (454.1  I83.10) Current Plans   Indication: Patient presents with symptomatic varicose veins of the bilateral  lower extremity.   Procedure: Sclerotherapy using hypertonic saline mixed with 1% Lidocaine was performed on the bilateral lower extremity. Compression wraps were placed. The patient tolerated the procedure well. 

## 2022-11-17 ENCOUNTER — Encounter: Payer: Self-pay | Admitting: Internal Medicine

## 2022-11-17 NOTE — Assessment & Plan Note (Signed)
Controlled on aciphex.   

## 2022-11-17 NOTE — Assessment & Plan Note (Signed)
Seeing ortho.  S/p ESI.  Had f/u with ortho due to persistent pain.  Referred to PT and NSU. Evaluated by Dr April Santos 04/23/22 - referred for back pain and leg pain.  Was feeling better at that appt.  Has had PT and ESI's.  Anterolisthesis of L4 and L5 that could cause compression of the right L4 nerve root.  S/p ESI 07/02/22 - Dr April Santos. Has been seeing Dr April Santos - f/u right leg pain most c/w L5 distribution pain.  Has been doing therapy.  Feeling better.

## 2022-11-17 NOTE — Assessment & Plan Note (Signed)
Saw gyn 04/23/22 - PAP - ok.  Recommended repeat next year.

## 2022-11-17 NOTE — Assessment & Plan Note (Signed)
Followed by Dr Sharma.  

## 2022-11-17 NOTE — Assessment & Plan Note (Signed)
Not a significant issue for her now.  Benefiber and probiotics.  Follow.

## 2022-11-17 NOTE — Assessment & Plan Note (Signed)
Followed by dermatology.  On otezla.

## 2022-11-17 NOTE — Assessment & Plan Note (Signed)
Overall appears to be handling things relatively well. 

## 2022-11-17 NOTE — Assessment & Plan Note (Signed)
Planning f/u with ortho - injection.

## 2022-11-17 NOTE — Assessment & Plan Note (Signed)
Physical today 11/15/22. Breast and pelvic exam through gyn.  Mammogram 12/21/21- Birads II.  colonosocpy 06/2016.  Recommended f/u in 10 years.

## 2022-11-19 DIAGNOSIS — M5441 Lumbago with sciatica, right side: Secondary | ICD-10-CM | POA: Diagnosis not present

## 2022-11-19 DIAGNOSIS — G8929 Other chronic pain: Secondary | ICD-10-CM | POA: Diagnosis not present

## 2022-11-28 DIAGNOSIS — M19012 Primary osteoarthritis, left shoulder: Secondary | ICD-10-CM | POA: Diagnosis not present

## 2022-11-28 DIAGNOSIS — M75102 Unspecified rotator cuff tear or rupture of left shoulder, not specified as traumatic: Secondary | ICD-10-CM | POA: Diagnosis not present

## 2022-12-03 DIAGNOSIS — M5441 Lumbago with sciatica, right side: Secondary | ICD-10-CM | POA: Diagnosis not present

## 2022-12-03 DIAGNOSIS — G8929 Other chronic pain: Secondary | ICD-10-CM | POA: Diagnosis not present

## 2022-12-16 ENCOUNTER — Ambulatory Visit (INDEPENDENT_AMBULATORY_CARE_PROVIDER_SITE_OTHER): Payer: Medicare PPO | Admitting: Nurse Practitioner

## 2022-12-16 ENCOUNTER — Encounter (INDEPENDENT_AMBULATORY_CARE_PROVIDER_SITE_OTHER): Payer: Self-pay | Admitting: Nurse Practitioner

## 2022-12-16 VITALS — BP 119/73 | HR 63 | Resp 16 | Wt 134.8 lb

## 2022-12-16 DIAGNOSIS — I83813 Varicose veins of bilateral lower extremities with pain: Secondary | ICD-10-CM

## 2022-12-16 NOTE — Progress Notes (Signed)
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   Procedure: Sclerotherapy using hypertonic saline mixed with 1% Lidocaine was performed on the bilateral lower extremity. Compression wraps were placed. The patient tolerated the procedure well.

## 2022-12-24 ENCOUNTER — Encounter: Payer: Self-pay | Admitting: Internal Medicine

## 2022-12-24 ENCOUNTER — Telehealth: Payer: Self-pay | Admitting: Internal Medicine

## 2022-12-24 DIAGNOSIS — Z1231 Encounter for screening mammogram for malignant neoplasm of breast: Secondary | ICD-10-CM | POA: Diagnosis not present

## 2022-12-24 DIAGNOSIS — E2839 Other primary ovarian failure: Secondary | ICD-10-CM

## 2022-12-24 LAB — HM MAMMOGRAPHY

## 2022-12-24 NOTE — Telephone Encounter (Signed)
Copied from CRM [id number]. Topic: Medicare AWV >> Dec 24, 2022  3:33 PM Payton Doughty wrote: Reason for CRM: LM 12/24/2022 to schedule AWV   Verlee Rossetti; Care Guide Ambulatory Clinical Support [place] [facility] Direct Dial: [phone]

## 2022-12-24 NOTE — Telephone Encounter (Signed)
Ok to order DEXA scan for her?

## 2022-12-24 NOTE — Telephone Encounter (Signed)
Ok

## 2022-12-26 NOTE — Telephone Encounter (Signed)
Placed in quick sign for you to sign

## 2022-12-26 NOTE — Telephone Encounter (Signed)
Signed and placed in box.

## 2022-12-30 ENCOUNTER — Telehealth (INDEPENDENT_AMBULATORY_CARE_PROVIDER_SITE_OTHER): Payer: Self-pay

## 2022-12-30 ENCOUNTER — Encounter (INDEPENDENT_AMBULATORY_CARE_PROVIDER_SITE_OTHER): Payer: Self-pay

## 2022-12-30 NOTE — Telephone Encounter (Signed)
Patient will be sending picture through Mychart

## 2022-12-30 NOTE — Telephone Encounter (Signed)
Patient left a message concern with right leg sclerotherapy injection site from 12/16/2022 visit. Patient stated that the area is red and is non healing. Patient is concern that the site is ulcerating. Please Advise

## 2022-12-30 NOTE — Telephone Encounter (Signed)
Can she send [place] a pic on my chart?

## 2022-12-31 ENCOUNTER — Other Ambulatory Visit (INDEPENDENT_AMBULATORY_CARE_PROVIDER_SITE_OTHER): Payer: Self-pay | Admitting: Nurse Practitioner

## 2022-12-31 ENCOUNTER — Encounter: Payer: Self-pay | Admitting: Internal Medicine

## 2022-12-31 MED ORDER — DOXYCYCLINE HYCLATE 100 MG PO CAPS: 100.0000 mg | ORAL_CAPSULE | Freq: Two times a day (BID) | ORAL | 0 refills | Status: DC

## 2022-12-31 MED ORDER — MUPIROCIN 2 % EX OINT: 1.0000 | TOPICAL_OINTMENT | Freq: Every day | CUTANEOUS | 0 refills | Status: DC

## 2022-12-31 NOTE — Telephone Encounter (Signed)
Patient sent in doxycycline and mupirocin, will keep her follow up on 10/7 to look at wound

## 2023-01-01 DIAGNOSIS — M25512 Pain in left shoulder: Secondary | ICD-10-CM | POA: Diagnosis not present

## 2023-01-02 DIAGNOSIS — M25512 Pain in left shoulder: Secondary | ICD-10-CM | POA: Diagnosis not present

## 2023-01-06 ENCOUNTER — Other Ambulatory Visit (INDEPENDENT_AMBULATORY_CARE_PROVIDER_SITE_OTHER): Payer: Self-pay | Admitting: Nurse Practitioner

## 2023-01-06 ENCOUNTER — Encounter (INDEPENDENT_AMBULATORY_CARE_PROVIDER_SITE_OTHER): Payer: Self-pay

## 2023-01-06 MED ORDER — SILVER SULFADIAZINE 1 % EX CREA: 1.0000 | TOPICAL_CREAM | Freq: Two times a day (BID) | CUTANEOUS | 0 refills | Status: DC

## 2023-01-06 NOTE — Telephone Encounter (Signed)
I called and discussed with patient.  I told her to stop Neupro sent and called in sulfa Silvadene.  Advised that if we have a cancellation this week we will try to get her in to myself or Dr. Wyn Quaker

## 2023-01-07 ENCOUNTER — Ambulatory Visit (INDEPENDENT_AMBULATORY_CARE_PROVIDER_SITE_OTHER): Payer: Medicare PPO | Admitting: Vascular Surgery

## 2023-01-07 VITALS — BP 126/80 | HR 75 | Resp 18 | Ht 61.0 in | Wt 133.6 lb

## 2023-01-07 DIAGNOSIS — I83893 Varicose veins of bilateral lower extremities with other complications: Secondary | ICD-10-CM | POA: Diagnosis not present

## 2023-01-07 NOTE — Progress Notes (Signed)
MRN : [id number]  April Santos is a 67 y.o. (10-25-1955) female who presents with chief complaint of  Chief Complaint  Patient presents with   Venous Insufficiency  .  History of Present Illness: Patient returns today in follow up of her varicose veins.  She had a third sclerotherapy treatment about 3 weeks ago and has developed a small ulceration on the right anterior lower leg.  She also has some dark spots from several of her sclerotherapy treatments particular in the right medial calf area.  Overall, her venous burden is much improved.  Current Outpatient Medications  Medication Sig Dispense Refill   Apremilast (OTEZLA) 30 MG TABS Take 1 tablet (30 mg total) by mouth daily. 30 tablet 1   B Complex-C (B-COMPLEX WITH VITAMIN C) tablet Take 1 tablet by mouth daily.     cholecalciferol (VITAMIN D3) 25 MCG (1000 UNIT) tablet Take 1,000 Units by mouth daily.     Crisaborole (EUCRISA) 2 % OINT Apply 1 application topically 2 (two) times daily as needed (psoriasis). 60 g 1   desonide (DESOWEN) 0.05 % cream Apply 1 application topically 2 (two) times daily as needed (psoriasis).     doxycycline (VIBRAMYCIN) 100 MG capsule Take 1 capsule (100 mg total) by mouth 2 (two) times daily. 20 capsule 0   estradiol (ESTRACE VAGINAL) 0.1 MG/GM vaginal cream Insert 1 gram vaginally 1-2 times weekly as maintenance 42.5 g 2   gabapentin (NEURONTIN) 400 MG capsule Take 800 mg by mouth 2 (two) times daily.     HYDROcodone-acetaminophen (NORCO/VICODIN) 5-325 MG tablet Take by mouth as needed.     RABEprazole (ACIPHEX) 20 MG tablet Take 1 tablet (20 mg total) by mouth daily. 90 tablet 3   silver sulfADIAZINE (SILVADENE) 1 % cream Apply 1 Application topically 2 (two) times daily. 50 g 0   Tapinarof (VTAMA) 1 % CREA Apply 1 application. topically daily. Qd to aa psoriasis 60 g 6   triamcinolone cream (KENALOG) 0.1 % Apply 1 Application topically 2 (two) times daily as needed. For up to 2 weeks. Avoid applying  to face, groin, and axilla. Use as directed. Long-term use can cause thinning of the skin. 80 g 1   ALPRAZolam (XANAX) 0.25 MG tablet Take 1 tablet (0.25 mg total) by mouth at bedtime as needed. (Patient not taking: Reported on 01/07/2023) 30 tablet 0   mupirocin ointment (BACTROBAN) 2 % Apply 1 Application topically daily. (Patient not taking: Reported on 01/07/2023) 44 g 0   No current facility-administered medications for this visit.    Past Medical History:  Diagnosis Date   Arthritis    Colon polyps    COVID-19 2022   Disc disorder    Dysphagia    Family history of adverse reaction to anesthesia    mother nausea   GERD (gastroesophageal reflux disease)    History of uterine fibroid    Psoriasis     Past Surgical History:  Procedure Laterality Date   ABDOMINAL HYSTERECTOMY     BREAST SURGERY  1999   biopsy   carpal  2005   Tunnel right hand   CARPOMETACARPAL ([facility]) FUSION OF THUMB Right 11/16/2020   Procedure: Right CARPOMETACARPAL [facility]) ARTHROPLASTY;  Surgeon: Kennedy Bucker, MD;  Location: [facility] ORS;  Service: Orthopedics;  Laterality: Right;   COLONOSCOPY WITH ESOPHAGOGASTRODUODENOSCOPY (EGD)     COLONOSCOPY WITH PROPOFOL N/A 06/10/2016   Procedure: COLONOSCOPY WITH PROPOFOL;  Surgeon: Scot Jun, MD;  Location: [facility] ENDOSCOPY;  Service:  Endoscopy;  Laterality: N/A;   ESOPHAGOGASTRODUODENOSCOPY (EGD) WITH PROPOFOL N/A 06/10/2016   Procedure: ESOPHAGOGASTRODUODENOSCOPY (EGD) WITH PROPOFOL;  Surgeon: Scot Jun, MD;  Location: [facility] ENDOSCOPY;  Service: Endoscopy;  Laterality: N/A;   FOOT SURGERY  02/07/1003   right foot benign tumor   SACROILIAC JOINT INJECTION  08/17/13 & 04/13/14   Dr. Yves Dill   SIGMOIDOSCOPY     UTERINE FIBROID SURGERY  1997     Social History   Tobacco Use   Smoking status: Never   Smokeless tobacco: Never  Vaping Use   Vaping status: Never Used  Substance Use Topics   Alcohol use: Yes    Alcohol/week: 1.0 standard drink of alcohol     Types: 1 Glasses of wine per week    Comment: bimonthly   Drug use: No       Family History  Problem Relation Age of Onset   Breast cancer Mother        10/74   Heart disease Father    Arthritis Father    GER disease Father    Diabetes Brother    Diabetes Maternal Uncle    Colon cancer Neg Hx      Allergies  Allergen Reactions   Erythromycin Ethylsuccinate [Erythromycin] Other (See Comments)    Unknown   Omnicef [Cefdinir] Hives and Itching     REVIEW OF SYSTEMS (Negative unless checked)  Constitutional: [] Weight loss  [] Fever  [] Chills Cardiac: [] Chest pain   [] Chest pressure   [] Palpitations   [] Shortness of breath when laying flat   [] Shortness of breath at rest   [] Shortness of breath with exertion. Vascular:  [] Pain in legs with walking   [] Pain in legs at rest   [] Pain in legs when laying flat   [] Claudication   [] Pain in feet when walking  [] Pain in feet at rest  [] Pain in feet when laying flat   [] History of DVT   [] Phlebitis   [x] Swelling in legs   [x] Varicose veins   [] Non-healing ulcers Pulmonary:   [] Uses home oxygen   [] Productive cough   [] Hemoptysis   [] Wheeze  [] COPD   [] Asthma Neurologic:  [] Dizziness  [] Blackouts   [] Seizures   [] History of stroke   [] History of TIA  [] Aphasia   [] Temporary blindness   [] Dysphagia   [] Weakness or numbness in arms   [] Weakness or numbness in legs Musculoskeletal:  [] Arthritis   [] Joint swelling   [] Joint pain   [] Low back pain Hematologic:  [] Easy bruising  [] Easy bleeding   [] Hypercoagulable state   [] Anemic   Gastrointestinal:  [] Blood in stool   [] Vomiting blood  [] Gastroesophageal reflux/heartburn   [] Abdominal pain Genitourinary:  [] Chronic kidney disease   [] Difficult urination  [] Frequent urination  [] Burning with urination   [] Hematuria Skin:  [] Rashes   [] Ulcers   [] Wounds Psychological:  [] History of anxiety   []  History of major depression.  Physical Examination  BP 126/80 (BP Location: Left Arm)   Pulse 75    Resp 18   Ht 5\' 1"  (1.549 m)   Wt 133 lb 9.6 oz (60.6 kg)   LMP 02/26/2002   BMI 25.24 kg/m  Gen:  WD/WN, NAD Head: [place]/AT, No temporalis wasting. Ear/Nose/Throat: Hearing grossly intact, nares w/o erythema or drainage Eyes: Conjunctiva clear. Sclera non-icteric Neck: Supple.  Trachea midline Pulmonary:  Good air movement, no use of accessory muscles.  Cardiac: RRR, no JVD Vascular: Superficial varicosities fairly scant at this point.  Some dark spots over some of the sclerotherapy  areas.  Small less than 1 cm circular wound on the right anterior mid lower leg with some surrounding erythema.  Dermatologic: Small scab on the right anterior mid lower leg with surrounding erythema.      Labs Recent Results (from the past 2160 hour(s))  Hepatic function panel     Status: None   Collection Time: 11/12/22  7:40 AM  Result Value Ref Range   Total Bilirubin 0.3 0.2 - 1.2 mg/dL   Bilirubin, Direct 0.1 0.0 - 0.3 mg/dL   Alkaline Phosphatase 72 39 - 117 U/L   AST 16 0 - 37 U/L   ALT 15 0 - 35 U/L   Total Protein 6.9 6.0 - 8.3 g/dL   Albumin 4.2 3.5 - 5.2 g/dL  Hemoglobin N0U     Status: None   Collection Time: 11/12/22  7:40 AM  Result Value Ref Range   Hgb A1c MFr Bld 5.8 4.6 - 6.5 %    Comment: Glycemic Control Guidelines for People with Diabetes:Non Diabetic:  <6%Goal of Therapy: <7%Additional Action Suggested:  >8%   Lipid panel     Status: None   Collection Time: 11/12/22  7:40 AM  Result Value Ref Range   Cholesterol 183 0 - 200 mg/dL    Comment: ATP III Classification       Desirable:  < 200 mg/dL               Borderline High:  200 - 239 mg/dL          High:  > = 725 mg/dL   Triglycerides 366.4 0.0 - 149.0 mg/dL    Comment: Normal:  <403 mg/dLBorderline High:  150 - 199 mg/dL   HDL 47.42 >59.56 mg/dL   VLDL 38.7 0.0 - 56.4 mg/dL   LDL Cholesterol 99 0 - 99 mg/dL   Total CHOL/HDL Ratio 3     Comment:                Men          Women1/2 Average Risk     3.4           3.3Average Risk          5.0          4.42X Average Risk          9.6          7.13X Average Risk          15.0          11.0                       NonHDL 119.82     Comment: NOTE:  Non-HDL goal should be 30 mg/dL higher than patient's LDL goal (i.e. LDL goal of < 70 mg/dL, would have non-HDL goal of < 100 mg/dL)  Basic metabolic panel     Status: None   Collection Time: 11/12/22  7:40 AM  Result Value Ref Range   Sodium 142 135 - 145 mEq/L   Potassium 4.0 3.5 - 5.1 mEq/L   Chloride 105 96 - 112 mEq/L   CO2 27 19 - 32 mEq/L   Glucose, Bld 89 70 - 99 mg/dL   BUN 16 6 - 23 mg/dL   Creatinine, Ser 3.32 0.40 - 1.20 mg/dL   GFR 95.18 >84.16 mL/min    Comment: Calculated using the CKD-EPI Creatinine Equation (2021)   Calcium 9.7 8.4 - 10.5 mg/dL  HM MAMMOGRAPHY  Status: None   Collection Time: 12/24/22  9:09 AM  Result Value Ref Range   HM Mammogram 0-4 Bi-Rad 0-4 Bi-Rad, Self Reported Normal    Comment: Abstracted by HIM    Radiology No results found.  Assessment/Plan  Varicose veins of leg with swelling, bilateral Symptoms are some better after laser ablation of both great saphenous veins.  Venous burden improved.  Does have a small wound from sclerotherapy that we are treating with Silvadene.  Continue local wound care.  Follow-up as needed.    Festus Barren, MD  01/07/2023 12:23 PM    This note was created with Dragon medical transcription system.  Any errors from dictation are purely unintentional

## 2023-01-07 NOTE — Assessment & Plan Note (Signed)
Symptoms are some better after laser ablation of both great saphenous veins.  Venous burden improved.  Does have a small wound from sclerotherapy that we are treating with Silvadene.  Continue local wound care.  Follow-up as needed.

## 2023-01-09 DIAGNOSIS — M75122 Complete rotator cuff tear or rupture of left shoulder, not specified as traumatic: Secondary | ICD-10-CM | POA: Diagnosis not present

## 2023-01-10 ENCOUNTER — Encounter: Payer: Self-pay | Admitting: Internal Medicine

## 2023-01-10 DIAGNOSIS — M75100 Unspecified rotator cuff tear or rupture of unspecified shoulder, not specified as traumatic: Secondary | ICD-10-CM | POA: Insufficient documentation

## 2023-01-13 ENCOUNTER — Ambulatory Visit (INDEPENDENT_AMBULATORY_CARE_PROVIDER_SITE_OTHER): Payer: Medicare PPO | Admitting: Nurse Practitioner

## 2023-01-20 DIAGNOSIS — M25512 Pain in left shoulder: Secondary | ICD-10-CM | POA: Diagnosis not present

## 2023-01-22 ENCOUNTER — Ambulatory Visit (INDEPENDENT_AMBULATORY_CARE_PROVIDER_SITE_OTHER): Payer: Medicare PPO | Admitting: *Deleted

## 2023-01-22 VITALS — Ht 61.0 in | Wt 133.0 lb

## 2023-01-22 DIAGNOSIS — Z0000 Encounter for general adult medical examination without abnormal findings: Secondary | ICD-10-CM

## 2023-01-22 NOTE — Patient Instructions (Signed)
April Santos , Thank you for taking time to come for your Medicare Wellness Visit. I appreciate your ongoing commitment to your health goals. Please review the following plan we discussed and let me know if I can assist you in the future.   Referrals/Orders/Follow-Ups/Clinician Recommendations: Remember to update your vaccines  This is a list of the screening recommended for you and due dates:  Health Maintenance  Topic Date Due   Hepatitis C Screening  Never done   DTaP/Tdap/Td vaccine (1 - Tdap) Never done   COVID-19 Vaccine (5 - 2023-24 season) 12/08/2022   Flu Shot  07/07/2023*   Mammogram  12/24/2023   Medicare Annual Wellness Visit  01/22/2024   Colon Cancer Screening  06/11/2026   Pneumonia Vaccine  Completed   DEXA scan (bone density measurement)  Completed   Zoster (Shingles) Vaccine  Completed   HPV Vaccine  Aged Out  *Topic was postponed. The date shown is not the original due date.    Advanced directives: (Copy Requested) Please bring a copy of your health care power of attorney and living will to the office to be added to your chart at your convenience.  Next Medicare Annual Wellness Visit scheduled for next year: Yes 01/27/24 @ 9:40  Managing Pain Without Opioids Opioids are strong medicines used to treat moderate to severe pain. For some people, especially those who have long-term (chronic) pain, opioids may not be the best choice for pain management due to: Side effects like nausea, constipation, and sleepiness. The risk of addiction (opioid use disorder). The longer you take opioids, the greater your risk of addiction. Pain that lasts for more than 3 months is called chronic pain. Managing chronic pain usually requires more than one approach and is often provided by a team of health care providers working together (multidisciplinary approach). Pain management may be done at a pain management center or pain clinic. How to manage pain without the use of opioids Use  non-opioid medicines Non-opioid medicines for pain may include: Over-the-counter or prescription non-steroidal anti-inflammatory drugs (NSAIDs). These may be the first medicines used for pain. They work well for muscle and bone pain, and they reduce swelling. Acetaminophen. This over-the-counter medicine may work well for milder pain but not swelling. Antidepressants. These may be used to treat chronic pain. A certain type of antidepressant (tricyclics) is often used. These medicines are given in lower doses for pain than when used for depression. Anticonvulsants. These are usually used to treat seizures but may also reduce nerve (neuropathic) pain. Muscle relaxants. These relieve pain caused by sudden muscle tightening (spasms). You may also use a pain medicine that is applied to the skin as a patch, cream, or gel (topical analgesic), such as a numbing medicine. These may cause fewer side effects than medicines taken by mouth. Do certain therapies as directed Some therapies can help with pain management. They include: Physical therapy. You will do exercises to gain strength and flexibility. A physical therapist may teach you exercises to move and stretch parts of your body that are weak, stiff, or painful. You can learn these exercises at physical therapy visits and practice them at home. Physical therapy may also involve: Massage. Heat wraps or applying heat or cold to affected areas. Electrical signals that interrupt pain signals (transcutaneous electrical nerve stimulation, TENS). Weak lasers that reduce pain and swelling (low-level laser therapy). Signals from your body that help you learn to regulate pain (biofeedback). Occupational therapy. This helps you to learn ways to function  at home and work with less pain. Recreational therapy. This involves trying new activities or hobbies, such as a physical activity or drawing. Mental health therapy, including: Cognitive behavioral therapy (CBT).  This helps you learn coping skills for dealing with pain. Acceptance and commitment therapy (ACT) to change the way you think and react to pain. Relaxation therapies, including muscle relaxation exercises and mindfulness-based stress reduction. Pain management counseling. This may be individual, family, or group counseling.  Receive medical treatments Medical treatments for pain management include: Nerve block injections. These may include a pain blocker and anti-inflammatory medicines. You may have injections: Near the spine to relieve chronic back or neck pain. Into joints to relieve back or joint pain. Into nerve areas that supply a painful area to relieve body pain. Into muscles (trigger point injections) to relieve some painful muscle conditions. A medical device placed near your spine to help block pain signals and relieve nerve pain or chronic back pain (spinal cord stimulation device). Acupuncture. Follow these instructions at home Medicines Take over-the-counter and prescription medicines only as told by your health care provider. If you are taking pain medicine, ask your health care providers about possible side effects to watch out for. Do not drive or use heavy machinery while taking prescription opioid pain medicine. Lifestyle  Do not use drugs or alcohol to reduce pain. If you drink alcohol, limit how much you have to: 0-1 drink a day for women who are not pregnant. 0-2 drinks a day for men. Know how much alcohol is in a drink. In the [place] one drink equals one 12 oz bottle of beer (355 mL), one 5 oz glass of wine (148 mL), or one 1 oz glass of hard liquor (44 mL). Do not use any products that contain nicotine or tobacco. These products include cigarettes, chewing tobacco, and vaping devices, such as e-cigarettes. If you need help quitting, ask your health care provider. Eat a healthy diet and maintain a healthy weight. Poor diet and excess weight may make pain worse. Eat  foods that are high in fiber. These include fresh fruits and vegetables, whole grains, and beans. Limit foods that are high in fat and processed sugars, such as fried and sweet foods. Exercise regularly. Exercise lowers stress and may help relieve pain. Ask your health care provider what activities and exercises are safe for you. If your health care provider approves, join an exercise class that combines movement and stress reduction. Examples include yoga and tai chi. Get enough sleep. Lack of sleep may make pain worse. Lower stress as much as possible. Practice stress reduction techniques as told by your therapist. General instructions Work with all your pain management providers to find the treatments that work best for you. You are an important member of your pain management team. There are many things you can do to reduce pain on your own. Consider joining an online or in-person support group for people who have chronic pain. Keep all follow-up visits. This is important. Where to find more information You can find more information about managing pain without opioids from: American Academy of Pain Medicine: painmed.org Institute for Chronic Pain: instituteforchronicpain.org American Chronic Pain Association: theacpa.org Contact a health care provider if: You have side effects from pain medicine. Your pain gets worse or does not get better with treatments or home therapy. You are struggling with anxiety or depression. Summary Many types of pain can be managed without opioids. Chronic pain may respond better to pain management without opioids.  Pain is best managed when you and a team of health care providers work together. Pain management without opioids may include non-opioid medicines, medical treatments, physical therapy, mental health therapy, and lifestyle changes. Tell your health care providers if your pain gets worse or is not being managed well enough. This information is not  intended to replace advice given to you by your health care provider. Make sure you discuss any questions you have with your health care provider. Document Revised: 07/05/2020 Document Reviewed: 07/05/2020 Elsevier Patient Education  2024 [employer].

## 2023-01-22 NOTE — Progress Notes (Signed)
Subjective:   April Santos is a 67 y.o. female who presents for Medicare Annual (Subsequent) preventive examination.  Visit Complete: Virtual I connected with  [name] on 01/22/23 by a audio enabled telemedicine application and verified that I am speaking with the correct person using two identifiers.  Patient Location: Home  Provider Location: Office/Clinic  I discussed the limitations of evaluation and management by telemedicine. The patient expressed understanding and agreed to proceed.  Vital Signs: Because this visit was a virtual/telehealth visit, some criteria may be missing or patient reported. Any vitals not documented were not able to be obtained and vitals that have been documented are patient reported.   Cardiac Risk Factors include: advanced age (>80men, >68 women)     Objective:    Today's Vitals   01/22/23 1536  Weight: 133 lb (60.3 kg)  Height: 5\' 1"  (1.549 m)  PainSc: 6    Body mass index is 25.13 kg/m.     01/22/2023    3:52 PM 07/05/2021   12:37 PM 11/16/2020   10:55 AM 11/09/2020    8:54 AM 07/04/2020   12:47 PM 06/10/2016   10:32 AM  Advanced Directives  Does Patient Have a Medical Advance Directive? Yes Yes Yes Yes Yes No  Type of Estate agent of [place];Living will Healthcare Power of House;Living will Healthcare Power of [employer] Power of [employer] Power of [place];Living will   Does patient want to make changes to medical advance directive?  No - Patient declined No - Patient declined  No - Patient declined   Copy of Healthcare Power of Attorney in Chart? No - copy requested No - copy requested No - copy requested  No - copy requested     Current Medications (verified) Outpatient Encounter Medications as of 01/22/2023  Medication Sig   ALPRAZolam (XANAX) 0.25 MG tablet Take 1 tablet (0.25 mg total) by mouth at bedtime as needed.   Apremilast (OTEZLA) 30 MG TABS Take 1 tablet (30 mg total)  by mouth daily.   B Complex-C (B-COMPLEX WITH VITAMIN C) tablet Take 1 tablet by mouth daily.   cholecalciferol (VITAMIN D3) 25 MCG (1000 UNIT) tablet Take 1,000 Units by mouth daily.   Crisaborole (EUCRISA) 2 % OINT Apply 1 application topically 2 (two) times daily as needed (psoriasis).   desonide (DESOWEN) 0.05 % cream Apply 1 application topically 2 (two) times daily as needed (psoriasis).   estradiol (ESTRACE VAGINAL) 0.1 MG/GM vaginal cream Insert 1 gram vaginally 1-2 times weekly as maintenance   gabapentin (NEURONTIN) 400 MG capsule Take 800 mg by mouth 2 (two) times daily.   HYDROcodone-acetaminophen (NORCO/VICODIN) 5-325 MG tablet Take by mouth as needed.   RABEprazole (ACIPHEX) 20 MG tablet Take 1 tablet (20 mg total) by mouth daily.   silver sulfADIAZINE (SILVADENE) 1 % cream Apply 1 Application topically 2 (two) times daily.   Tapinarof (VTAMA) 1 % CREA Apply 1 application. topically daily. Qd to aa psoriasis   triamcinolone cream (KENALOG) 0.1 % Apply 1 Application topically 2 (two) times daily as needed. For up to 2 weeks. Avoid applying to face, groin, and axilla. Use as directed. Long-term use can cause thinning of the skin.   mupirocin ointment (BACTROBAN) 2 % Apply 1 Application topically daily. (Patient not taking: Reported on 01/22/2023)   [DISCONTINUED] doxycycline (VIBRAMYCIN) 100 MG capsule Take 1 capsule (100 mg total) by mouth 2 (two) times daily. (Patient not taking: Reported on 01/22/2023)   No facility-administered  encounter medications on file as of 01/22/2023.    Allergies (verified) Erythromycin ethylsuccinate [erythromycin] and Omnicef [cefdinir]   History: Past Medical History:  Diagnosis Date   Arthritis    Colon polyps    COVID-19 2022   Disc disorder    Dysphagia    Family history of adverse reaction to anesthesia    mother nausea   GERD (gastroesophageal reflux disease)    History of uterine fibroid    Psoriasis    Past Surgical History:   Procedure Laterality Date   ABDOMINAL HYSTERECTOMY     BREAST SURGERY  1999   biopsy   carpal  2005   Tunnel right hand   CARPOMETACARPAL ([facility]) FUSION OF THUMB Right 11/16/2020   Procedure: Right CARPOMETACARPAL [facility]) ARTHROPLASTY;  Surgeon: Kennedy Bucker, MD;  Location: [facility] ORS;  Service: Orthopedics;  Laterality: Right;   COLONOSCOPY WITH ESOPHAGOGASTRODUODENOSCOPY (EGD)     COLONOSCOPY WITH PROPOFOL N/A 06/10/2016   Procedure: COLONOSCOPY WITH PROPOFOL;  Surgeon: Scot Jun, MD;  Location: [facility] ENDOSCOPY;  Service: Endoscopy;  Laterality: N/A;   ESOPHAGOGASTRODUODENOSCOPY (EGD) WITH PROPOFOL N/A 06/10/2016   Procedure: ESOPHAGOGASTRODUODENOSCOPY (EGD) WITH PROPOFOL;  Surgeon: Scot Jun, MD;  Location: [facility] ENDOSCOPY;  Service: Endoscopy;  Laterality: N/A;   FOOT SURGERY  02/07/1003   right foot benign tumor   SACROILIAC JOINT INJECTION  08/17/13 & 04/13/14   Dr. Yves Dill   SIGMOIDOSCOPY     UTERINE FIBROID SURGERY  1997   Family History  Problem Relation Age of Onset   Breast cancer Mother        74/74   Heart disease Father    Arthritis Father    GER disease Father    Diabetes Brother    Diabetes Maternal Uncle    Colon cancer Neg Hx    Social History   Socioeconomic History   Marital status: Significant Other    Spouse name: [name]   Number of children: 0   Years of education: Not on file   Highest education level: Not on file  Occupational History   Not on file  Tobacco Use   Smoking status: Never   Smokeless tobacco: Never  Vaping Use   Vaping status: Never Used  Substance and Sexual Activity   Alcohol use: Yes    Alcohol/week: 1.0 standard drink of alcohol    Types: 1 Glasses of wine per week    Comment: bimonthly   Drug use: No   Sexual activity: Yes    Birth control/protection: Surgical    Comment: Hysterectomy  Other Topics Concern   Not on file  Social History Narrative   [name] - POA   Social Determinants of Health    Financial Resource Strain: Low Risk  (01/22/2023)   Overall Financial Resource Strain (CARDIA)    Difficulty of Paying Living Expenses: Not hard at all  Food Insecurity: No Food Insecurity (01/22/2023)   Hunger Vital Sign    Worried About Running Out of Food in the Last Year: Never true    Ran Out of Food in the Last Year: Never true  Transportation Needs: No Transportation Needs (01/22/2023)   PRAPARE - Administrator, Civil Service (Medical): No    Lack of Transportation (Non-Medical): No  Physical Activity: Sufficiently Active (01/22/2023)   Exercise Vital Sign    Days of Exercise per Week: 5 days    Minutes of Exercise per Session: 40 min  Stress: Stress Concern Present (01/22/2023)   [place]  Institute of Occupational Health - Occupational Stress Questionnaire    Feeling of Stress : To some extent  Social Connections: Moderately Integrated (01/22/2023)   Social Connection and Isolation Panel [NHANES]    Frequency of Communication with Friends and Family: More than three times a week    Frequency of Social Gatherings with Friends and Family: More than three times a week    Attends Religious Services: More than 4 times per year    Active Member of [employer] or Organizations: No    Attends Engineer, structural: Never    Marital Status: Living with partner    Tobacco Counseling Counseling given: Not Answered   Clinical Intake:  Pre-visit preparation completed: Yes  Pain : 0-10 Pain Score: 6  Pain Type: Chronic pain Pain Location: Shoulder Pain Orientation: Left Pain Descriptors / Indicators: Stabbing Pain Onset: More than a month ago Pain Frequency: Constant     BMI - recorded: 25.13 Nutritional Status: BMI 25 -29 Overweight Nutritional Risks: None Diabetes: No  How often do you need to have someone help you when you read instructions, pamphlets, or other written materials from your doctor or pharmacy?: 1 - Never  Interpreter Needed?:  No  Information entered by :: R. Sherrine Salberg LPN   Activities of Daily Living    01/22/2023    3:39 PM  In your present state of health, do you have any difficulty performing the following activities:  Hearing? 0  Vision? 0  Comment contacts  Difficulty concentrating or making decisions? 0  Walking or climbing stairs? 0  Dressing or bathing? 0  Doing errands, shopping? 0  Preparing Food and eating ? N  Using the Toilet? N  In the past six months, have you accidently leaked urine? N  Do you have problems with loss of bowel control? N  Managing your Medications? N  Managing your Finances? N  Housekeeping or managing your Housekeeping? N    Patient Care Team: Dale , MD as PCP - General (Internal Medicine)  Indicate any recent Medical Services you may have received from other than [facility] providers in the past year (date may be approximate).     Assessment:   This is a routine wellness examination for April Santos.  Hearing/Vision screen Hearing Screening - Comments:: No issues Vision Screening - Comments:: contacts   Goals Addressed             This Visit's Progress    Patient Stated       Wants to get arm back in good shape and continue to walk and be outside       Depression Screen    01/22/2023    3:46 PM 11/15/2022    3:12 PM 11/08/2021   10:07 AM 07/05/2021   12:40 PM 11/24/2020    3:09 PM 07/04/2020   12:40 PM 01/28/2020    8:15 AM  PHQ 2/9 Scores  PHQ - 2 Score 0 0 0 0 0 0 0  PHQ- 9 Score 2          Fall Risk    01/22/2023    3:40 PM 11/15/2022    3:12 PM 11/08/2021   10:07 AM 07/05/2021   12:39 PM 11/24/2020    3:09 PM  Fall Risk   Falls in the past year? 1 0 0 0 0  Number falls in past yr: 0 0 0 0 0  Injury with Fall? 0 0 0  0  Risk for fall due to : History of fall(s);Impaired  balance/gait No Fall Risks No Fall Risks    Follow up Falls prevention discussed;Falls evaluation completed Falls evaluation completed Falls evaluation completed Falls  evaluation completed Falls evaluation completed    MEDICARE RISK AT HOME: Medicare Risk at Home Any stairs in or around the home?: No If so, are there any without handrails?: Yes Home free of loose throw rugs in walkways, pet beds, electrical cords, etc?: Yes Adequate lighting in your home to reduce risk of falls?: Yes Life alert?: No Use of a cane, walker or w/c?: No Grab bars in the bathroom?: Yes Shower chair or bench in shower?: Yes Elevated toilet seat or a handicapped toilet?: Yes   Cognitive Function:        01/22/2023    3:53 PM 07/05/2021   12:46 PM  6CIT Screen  What Year? 0 points 0 points  What month? 0 points 0 points  What time? 0 points 0 points  Count back from 20 0 points 0 points  Months in reverse 0 points 0 points  Repeat phrase 0 points   Total Score 0 points     Immunizations Immunization History  Administered Date(s) Administered   Fluad Quad(high Dose 65+) 01/07/2020, 12/28/2020   Influenza Split 04/28/2012   Influenza, High Dose Seasonal PF 01/11/2022   Influenza,inj,Quad PF,6+ Mos 02/02/2013, 01/12/2014, 01/04/2015, 02/05/2016, 01/06/2017, 01/19/2018, 12/18/2018   Moderna Covid-19 Vaccine Bivalent Booster 56yrs & up 01/19/2021   Moderna Sars-Covid-2 Vaccination 06/02/2019, 06/30/2019, 02/18/2020   PNEUMOCOCCAL CONJUGATE-20 08/21/2020   Zoster Recombinant(Shingrix) 12/28/2016, 05/31/2017    TDAP status: Due, Education has been provided regarding the importance of this vaccine. Advised may receive this vaccine at local pharmacy or Health Dept. Aware to provide a copy of the vaccination record if obtained from local pharmacy or Health Dept. Verbalized acceptance and understanding.  Flu Vaccine status: Up to date Patient is sending record  Pneumococcal vaccine status: Up to date  Covid-19 vaccine status: Information provided on how to obtain vaccines.   Qualifies for Shingles Vaccine? Yes   Zostavax completed No   Shingrix Completed?:  Yes  Screening Tests Health Maintenance  Topic Date Due   Hepatitis C Screening  Never done   DTaP/Tdap/Td (1 - Tdap) Never done   Medicare Annual Wellness (AWV)  07/06/2022   COVID-19 Vaccine (5 - 2023-24 season) 12/08/2022   INFLUENZA VACCINE  07/07/2023 (Originally 11/07/2022)   MAMMOGRAM  12/24/2023   Colonoscopy  06/11/2026   Pneumonia Vaccine 83+ Years old  Completed   DEXA SCAN  Completed   Zoster Vaccines- Shingrix  Completed   HPV VACCINES  Aged Out    Health Maintenance  Health Maintenance Due  Topic Date Due   Hepatitis C Screening  Never done   DTaP/Tdap/Td (1 - Tdap) Never done   Medicare Annual Wellness (AWV)  07/06/2022   COVID-19 Vaccine (5 - 2023-24 season) 12/08/2022    Colorectal cancer screening: Type of screening: Colonoscopy. Completed 06/2016. Repeat every 10 years  Mammogram status: Completed 12/2022. Repeat every year  Bone Density status: Completed 01/2021. Results reflect: Bone density results: OSTEOPENIA. Repeat every 2 years. Patient has scheduled  Lung Cancer Screening: (Low Dose CT Chest recommended if Age 41-80 years, 20 pack-year currently smoking OR have quit w/in 15years.) does not qualify.     Additional Screening:  Hepatitis C Screening: does qualify; Completed Patient declined  Vision Screening: Recommended annual ophthalmology exams for early detection of glaucoma and other disorders of the eye. Is the patient up to date with  their annual eye exam?  Yes  Who is the provider or what is the name of the office in which the patient attends annual eye exams? [facility] If pt is not established with a provider, would they like to be referred to a provider to establish care? No .   Dental Screening: Recommended annual dental exams for proper oral hygiene   Community Resource Referral / Chronic Care Management: CRR required this visit?  No   CCM required this visit?  No     Plan:     I have personally reviewed and noted the  following in the patient's chart:   Medical and social history Use of alcohol, tobacco or illicit drugs  Current medications and supplements including opioid prescriptions. Patient is currently taking opioid prescriptions. Information provided to patient regarding non-opioid alternatives. Patient advised to discuss non-opioid treatment plan with their provider. Functional ability and status Nutritional status Physical activity Advanced directives List of other physicians Hospitalizations, surgeries, and ER visits in previous 12 months Vitals Screenings to include cognitive, depression, and falls Referrals and appointments  In addition, I have reviewed and discussed with patient certain preventive protocols, quality metrics, and best practice recommendations. A written personalized care plan for preventive services as well as general preventive health recommendations were provided to patient.     Sydell Axon, LPN   08/65/7846   After Visit Summary: (MyChart) Due to this being a telephonic visit, the after visit summary with patients personalized plan was offered to patient via MyChart   Nurse Notes: None

## 2023-01-27 DIAGNOSIS — M75122 Complete rotator cuff tear or rupture of left shoulder, not specified as traumatic: Secondary | ICD-10-CM | POA: Diagnosis not present

## 2023-01-27 DIAGNOSIS — M7582 Other shoulder lesions, left shoulder: Secondary | ICD-10-CM | POA: Diagnosis not present

## 2023-01-29 DIAGNOSIS — K219 Gastro-esophageal reflux disease without esophagitis: Secondary | ICD-10-CM | POA: Diagnosis not present

## 2023-01-29 DIAGNOSIS — R1310 Dysphagia, unspecified: Secondary | ICD-10-CM | POA: Diagnosis not present

## 2023-02-03 DIAGNOSIS — E2839 Other primary ovarian failure: Secondary | ICD-10-CM | POA: Diagnosis not present

## 2023-02-03 DIAGNOSIS — M8589 Other specified disorders of bone density and structure, multiple sites: Secondary | ICD-10-CM | POA: Diagnosis not present

## 2023-02-03 LAB — HM DEXA SCAN

## 2023-02-04 ENCOUNTER — Encounter: Payer: Self-pay | Admitting: Internal Medicine

## 2023-02-11 ENCOUNTER — Telehealth: Payer: Self-pay

## 2023-02-11 NOTE — Telephone Encounter (Signed)
-----   Message from [place] sent at 02/05/2023  4:30 AM EDT ----- Notify - I received bone density report from [facility].  Bone density reveals osteopenia.  This means she has had some bone loss, but does not meet criteria for osteoporosis.  Continue calcium, vitamin D and weight bearing exercise.

## 2023-02-12 ENCOUNTER — Ambulatory Visit: Payer: Medicare PPO | Admitting: Dermatology

## 2023-02-13 NOTE — Telephone Encounter (Signed)
Noted  

## 2023-02-13 NOTE — Telephone Encounter (Signed)
Patient returned office phone call and note was read. 

## 2023-02-21 ENCOUNTER — Other Ambulatory Visit: Payer: Self-pay | Admitting: Surgery

## 2023-02-21 ENCOUNTER — Encounter: Payer: Self-pay | Admitting: Internal Medicine

## 2023-02-21 NOTE — Telephone Encounter (Signed)
If she feels better about rescheduling - I am ok.  Either way is fine.  I do not mind seeing her if wants to be checked.

## 2023-02-25 ENCOUNTER — Encounter
Admission: RE | Admit: 2023-02-25 | Discharge: 2023-02-25 | Disposition: A | Discharge status: Home / Self Care | Payer: Medicare PPO | Source: Ambulatory Visit | Attending: Surgery | Admitting: Surgery

## 2023-02-25 VITALS — BP 121/84 | HR 80 | Resp 12 | Ht 61.0 in | Wt 134.5 lb

## 2023-02-25 DIAGNOSIS — M75122 Complete rotator cuff tear or rupture of left shoulder, not specified as traumatic: Secondary | ICD-10-CM | POA: Diagnosis not present

## 2023-02-25 DIAGNOSIS — Z01818 Encounter for other preprocedural examination: Secondary | ICD-10-CM | POA: Diagnosis not present

## 2023-02-25 DIAGNOSIS — Z01810 Encounter for preprocedural cardiovascular examination: Secondary | ICD-10-CM | POA: Diagnosis not present

## 2023-02-25 DIAGNOSIS — Z01812 Encounter for preprocedural laboratory examination: Secondary | ICD-10-CM

## 2023-02-25 DIAGNOSIS — M75100 Unspecified rotator cuff tear or rupture of unspecified shoulder, not specified as traumatic: Secondary | ICD-10-CM

## 2023-02-25 LAB — URINALYSIS, ROUTINE W REFLEX MICROSCOPIC
Bilirubin Urine: NEGATIVE
Glucose, UA: NEGATIVE mg/dL
Hgb urine dipstick: NEGATIVE
Ketones, ur: NEGATIVE mg/dL
Leukocytes,Ua: NEGATIVE
Nitrite: NEGATIVE
Protein, ur: NEGATIVE mg/dL
Specific Gravity, Urine: 1.016 (ref 1.005–1.030)
pH: 6 (ref 5.0–8.0)

## 2023-02-25 LAB — COMPREHENSIVE METABOLIC PANEL
ALT: 19 U/L (ref 0–44)
AST: 21 U/L (ref 15–41)
Albumin: 4.2 g/dL (ref 3.5–5.0)
Alkaline Phosphatase: 72 U/L (ref 38–126)
Anion gap: 8 (ref 5–15)
BUN: 14 mg/dL (ref 8–23)
CO2: 29 mmol/L (ref 22–32)
Calcium: 9.7 mg/dL (ref 8.9–10.3)
Chloride: 103 mmol/L (ref 98–111)
Creatinine, Ser: 0.76 mg/dL (ref 0.44–1.00)
GFR, Estimated: >60 mL/min (ref >60)
Glucose, Bld: 97 mg/dL (ref 70–99)
Potassium: 3.8 mmol/L (ref 3.5–5.1)
Sodium: 140 mmol/L (ref 135–145)
Total Bilirubin: 0.4 mg/dL (ref <1.2)
Total Protein: 7.2 g/dL (ref 6.5–8.1)

## 2023-02-25 LAB — CBC WITH DIFFERENTIAL/PLATELET
Abs Immature Granulocytes: 0 10*3/uL (ref 0.00–0.07)
Basophils Absolute: 0 10*3/uL (ref 0.0–0.1)
Basophils Relative: 0 %
Eosinophils Absolute: 0 10*3/uL (ref 0.0–0.5)
Eosinophils Relative: 1 %
HCT: 40.4 % (ref 36.0–46.0)
Hemoglobin: 13.6 g/dL (ref 12.0–15.0)
Immature Granulocytes: 0 %
Lymphocytes Relative: 36 %
Lymphs Abs: 1.9 10*3/uL (ref 0.7–4.0)
MCH: 29.6 pg (ref 26.0–34.0)
MCHC: 33.7 g/dL (ref 30.0–36.0)
MCV: 87.8 fL (ref 80.0–100.0)
Monocytes Absolute: 0.5 10*3/uL (ref 0.1–1.0)
Monocytes Relative: 10 %
Neutro Abs: 2.8 10*3/uL (ref 1.7–7.7)
Neutrophils Relative %: 53 %
Platelets: 297 10*3/uL (ref 150–400)
RBC: 4.6 MIL/uL (ref 3.87–5.11)
RDW: 12.5 % (ref 11.5–15.5)
WBC: 5.3 10*3/uL (ref 4.0–10.5)
nRBC: 0 % (ref 0.0–0.2)

## 2023-02-25 LAB — SURGICAL PCR SCREEN
MRSA, PCR: NEGATIVE
Staphylococcus aureus: NEGATIVE

## 2023-02-25 NOTE — Progress Notes (Signed)
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   EMR review indicated that patient received PCN and/or cephalosporin in the past as follows: CEFAZOLIN received on 11/16/2020 with no documented ADRs. Allergy section in EMR updated to reflect tolerance.    Screened as appropriate for cephalosporin use during medication reconciliation No immediate angioedema, dysphagia, SOB, anaphylaxis symptoms. No severe rash involving mucous membranes or skin necrosis. No hospital admissions related to side effects of PCN/cephalosporin use.  No documented reaction to PCN or cephalosporin in the last 10 years.  Request:  As an evidence based approach to reducing the rate of incidence for post-operative SSI and the development of MDROs, could an agent that allows for narrower antimicrobial coverage for preoperative prophylaxis in this patient's upcoming surgical course be considered?   Currently ordered preoperative prophylactic ABX:  VANCOMYCIN + LEVOFLOXACIN .   Specifically requesting change to cephalosporin (CEFAZOLIN).   Drug of choice for many procedures; it is the most widely studied antimicrobial agent with proven efficacy for  antimicrobial prophylaxis.   Desirable duration of action, spectrum of activity against organisms commonly encountered in surgery, and it has an excellent safety profile and low cost.   Active against streptococci, methicillin-susceptible staphylococci, and many gram-negative organisms.  Despite being a first-generation cephalosporin, cefazolin is structurally different than other cephalosporins. Cefazolin has two side chains (R1 and R2) that have structures that do not match those of any other FDA-approved ?-lactams or PCN.  True allergies to cefazolin are extremely rare (<1%) and are usually specific for its side chains, not the shared core ring.  Please communicate decision with me and I will change the orders in Epic as per your direction.    Things to consider: Many patients report that they were "allergic" to PCN earlier in life, however this does not translate into a true lifelong allergy. Patients can lose sensitivity to specific IgE antibodies over time if PCN is avoided (Kleris & Lugar, 2019).  Penicillin allergies are reported by 8% to 15% of the [place] population, but up to 95% of these allergies do not correspond to a true allergy when tested Cathren Laine et al., 2022).   Cross-sensitivity between PCN and cephalosporins has been documented as being as high as 10%, however this estimation included data believed to have been collected in a setting where there was contamination. Newer data suggests that the prevalence of cross-sensitivity between PCN and cephalosporins is actually estimated to be closer to 1% (Hermanides et al., 2018).   Patients labeled as PCN allergic, whether they are truly allergic or not, have been found to have inferior outcomes in terms of rates of serious infection, and these patients tend to have longer hospital stays [facility] & Lugar, 2019).  Treatment related secondary infections, such as Clostridioides difficile, have been linked to the improper use of broad spectrum  antibiotics in patients improperly labeled as PCN allergic (Kleris & Lugar, 2019).  Anaphylaxis from cephalosporins is rare and the evidence suggests that there is no increased risk of an anaphylactic type reaction when cephalosporins are used in a PCN allergic patient (Pichichero, 2006).  Citations: Hermanides J, Lemkes BA, Prins Gwenyth Bender MW, Terreehorst I. Presumed ?-Lactam Allergy and Cross-reactivity in the Operating Theater: A Practical Approach. Anesthesiology. 2018 Aug;129(2):335-342. doi: 10.1097/ALN.0000000000002252. PMID: 16109604.  Kleris, R. S., & Lugar, P. L. (2019). Things We Do For No Reason: Failing to Question a Penicillin Allergy History. Journal of hospital medicine, 14(10), ([phone]) [phone]. Advance online publication. airportbarriers.com  Pichichero, M. E. (2006). Cephalosporins can be prescribed safely for penicillin-allergic patients. Journal of family medicine, 55(2), 106-112. Accessed: https://cdn.mdedge.com/files/s60fs-public/Document/September-2017/5502JFP_AppliedEvidence1.pdf  Fransisco Beau., & Senaida Ores, D. R. (2022). Understanding Penicillin Allergy, Cross-reactivity, and Antibiotic Selection in the Preoperative Setting. The Journal of the American Academy of Orthopaedic Surgeons, 30(1), e1-e5. CallRank.tn   Quentin Mulling, MSN, APRN, FNP-C, CEN [facility]  Perioperative Services Nurse Practitioner Phone: [phone] Fax: [phone] 02/25/23 3:18 PM  NOTE: This note has been prepared using Dragon dictation software. Despite my best ability to proofread, there is always the potential that unintentional transcriptional errors may still occur from this process.

## 2023-02-25 NOTE — Patient Instructions (Addendum)
Your procedure is scheduled on:03-04-23 Tuesday Report to the Registration Desk on the 1st floor of the [facility].Then proceed to the 2nd floor Surgery Desk To find out your arrival time, please call [phone] between 1PM - 3PM on:03-03-23 Monday If your arrival time is 6:00 am, do not arrive before that time as the [facility] entrance doors do not open until 6:00 am.  REMEMBER: Instructions that are not followed completely may result in serious medical risk, up to and including death; or upon the discretion of your surgeon and anesthesiologist your surgery may need to be rescheduled.  Do not eat food after midnight the night before surgery.  No gum chewing or hard candies.  You may however, drink CLEAR liquids up to 2 hours before you are scheduled to arrive for your surgery. Do not drink anything within 2 hours of your scheduled arrival time.  Clear liquids include: - water  - apple juice without pulp - gatorade (not RED colors) - black coffee or tea (Do NOT add milk or creamers to the coffee or tea) Do NOT drink anything that is not on this list.  In addition, your doctor has ordered for you to drink the provided:  Ensure Pre-Surgery Clear Carbohydrate Drink  Drinking this carbohydrate drink up to two hours before surgery helps to reduce insulin resistance and improve patient outcomes. Please complete drinking 2 hours before scheduled arrival time.  One week prior to surgery:Last dose today (02-25-23) Stop Anti-inflammatories (NSAIDS) such as Advil, Aleve, Ibuprofen, Motrin, Naproxen, Naprosyn and Aspirin based products such as Excedrin, Goody's Powder, BC Powder. Stop ANY OVER THE COUNTER supplements until after surgery (B Complex, Hair Skin and Nails, Vitamin D3, Vitamin B12, Multivitamin, Heavenly Hair Gummy)  You may however, continue to take Tylenol if needed for pain up until the day of surgery.  Continue taking all of your other prescription medications up until the  day of surgery.  ON THE DAY OF SURGERY ONLY TAKE THESE MEDICATIONS WITH SIPS OF WATER: -gabapentin (NEURONTIN)  -RABEprazole (ACIPHEX)   No Alcohol for 24 hours before or after surgery.  No Smoking including e-cigarettes for 24 hours before surgery.  No chewable tobacco products for at least 6 hours before surgery.  No nicotine patches on the day of surgery.  Do not use any "recreational" drugs for at least a week (preferably 2 weeks) before your surgery.  Please be advised that the combination of cocaine and anesthesia may have negative outcomes, up to and including death. If you test positive for cocaine, your surgery will be cancelled.  On the morning of surgery brush your teeth with toothpaste and water, you may rinse your mouth with mouthwash if you wish. Do not swallow any toothpaste or mouthwash.  Use CHG Soap as directed on instruction sheet.  Do not wear jewelry, make-up, hairpins, clips or nail polish.  For welded (permanent) jewelry: bracelets, anklets, waist bands, etc.  Please have this removed prior to surgery.  If it is not removed, there is a chance that hospital personnel will need to cut it off on the day of surgery.  Do not wear lotions, powders, or perfumes.   Do not shave body hair from the neck down 48 hours before surgery.  Contact lenses, hearing aids and dentures may not be worn into surgery.  Do not bring valuables to the hospital. [facility] is not responsible for any missing/lost belongings or valuables.   Total Shoulder Arthroplasty:  use Benzoyl Peroxide 5% Gel as  directed on instruction sheet.  Notify your doctor if there is any change in your medical condition (cold, fever, infection).  Wear comfortable clothing (specific to your surgery type) to the hospital.  After surgery, you can help prevent lung complications by doing breathing exercises.  Take deep breaths and cough every 1-2 hours. Your doctor may order a device called an Incentive  Spirometer to help you take deep breaths. When coughing or sneezing, hold a pillow firmly against your incision with both hands. This is called "splinting." Doing this helps protect your incision. It also decreases belly discomfort.  If you are being admitted to the hospital overnight, leave your suitcase in the car. After surgery it may be brought to your room.  In case of increased patient census, it may be necessary for you, the patient, to continue your postoperative care in the Same Day Surgery department.  If you are being discharged the day of surgery, you will not be allowed to drive home. You will need a responsible individual to drive you home and stay with you for 24 hours after surgery.   If you are taking public transportation, you will need to have a responsible individual with you.  Please call the Pre-admissions Testing Dept. at [phone] if you have any questions about these instructions.  Surgery Visitation Policy:  Patients having surgery or a procedure may have two visitors.  Children under the age of 37 must have an adult with them who is not the patient.  Inpatient Visitation:    Visiting hours are 7 a.m. to 8 p.m. Up to four visitors are allowed at one time in a patient room. The visitors may rotate out with other people during the day.  One visitor age 24 or older may stay with the patient overnight and must be in the room by 8 p.m.    Pre-operative 5 CHG Bath Instructions   You can play a key role in reducing the risk of infection after surgery. Your skin needs to be as free of germs as possible. You can reduce the number of germs on your skin by washing with CHG (chlorhexidine gluconate) soap before surgery. CHG is an antiseptic soap that kills germs and continues to kill germs even after washing.   DO NOT use if you have an allergy to chlorhexidine/CHG or antibacterial soaps. If your skin becomes reddened or irritated, stop using the CHG and notify one  of our RNs at [phone].   Please shower with the CHG soap starting 4 days before surgery using the following schedule:     Please keep in mind the following:  DO NOT shave, including legs and underarms, starting the day of your first shower.   You may shave your face at any point before/day of surgery.  Place clean sheets on your bed the day you start using CHG soap. Use a clean washcloth (not used since being washed) for each shower. DO NOT sleep with pets once you start using the CHG.   CHG Shower Instructions:  If you choose to wash your hair and private area, wash first with your normal shampoo/soap.  After you use shampoo/soap, rinse your hair and body thoroughly to remove shampoo/soap residue.  Turn the water OFF and apply about 3 tablespoons (45 ml) of CHG soap to a CLEAN washcloth.  Apply CHG soap ONLY FROM YOUR NECK DOWN TO YOUR TOES (washing for 3-5 minutes)  DO NOT use CHG soap on face, private areas, open wounds, or sores.  Pay special attention to the area where your surgery is being performed.  If you are having back surgery, having someone wash your back for you may be helpful. Wait 2 minutes after CHG soap is applied, then you may rinse off the CHG soap.  Pat dry with a clean towel  Put on clean clothes/pajamas   If you choose to wear lotion, please use ONLY the CHG-compatible lotions on the back of this paper.     Additional instructions for the day of surgery: DO NOT APPLY any lotions, deodorants, cologne, or perfumes.   Put on clean/comfortable clothes.  Brush your teeth.  Ask your nurse before applying any prescription medications to the skin.      CHG Compatible Lotions   Aveeno Moisturizing lotion  Cetaphil Moisturizing Cream  Cetaphil Moisturizing Lotion  Clairol Herbal Essence Moisturizing Lotion, Dry Skin  Clairol Herbal Essence Moisturizing Lotion, Extra Dry Skin  Clairol Herbal Essence Moisturizing Lotion, Normal Skin  Curel Age Defying  Therapeutic Moisturizing Lotion with Alpha Hydroxy  Curel Extreme Care Body Lotion  Curel Soothing Hands Moisturizing Hand Lotion  Curel Therapeutic Moisturizing Cream, Fragrance-Free  Curel Therapeutic Moisturizing Lotion, Fragrance-Free  Curel Therapeutic Moisturizing Lotion, Original Formula  Eucerin Daily Replenishing Lotion  Eucerin Dry Skin Therapy Plus Alpha Hydroxy Crme  Eucerin Dry Skin Therapy Plus Alpha Hydroxy Lotion  Eucerin Original Crme  Eucerin Original Lotion  Eucerin Plus Crme Eucerin Plus Lotion  Eucerin TriLipid Replenishing Lotion  Keri Anti-Bacterial Hand Lotion  Keri Deep Conditioning Original Lotion Dry Skin Formula Softly Scented  Keri Deep Conditioning Original Lotion, Fragrance Free Sensitive Skin Formula  Keri Lotion Fast Absorbing Fragrance Free Sensitive Skin Formula  Keri Lotion Fast Absorbing Softly Scented Dry Skin Formula  Keri Original Lotion  Keri Skin Renewal Lotion Keri Silky Smooth Lotion  Keri Silky Smooth Sensitive Skin Lotion  Nivea Body Creamy Conditioning Oil  Nivea Body Extra Enriched Lotion  Nivea Body Original Lotion  Nivea Body Sheer Moisturizing Lotion Nivea Crme  Nivea Skin Firming Lotion  NutraDerm 30 Skin Lotion  NutraDerm Skin Lotion  NutraDerm Therapeutic Skin Cream  NutraDerm Therapeutic Skin Lotion  ProShield Protective Hand Cream  Provon moisturizing lotion  Preparing for Total Shoulder Arthroplasty  Before surgery, you can play an important role by reducing the number of germs on your skin by using the following products:  Benzoyl Peroxide Gel  o Reduces the number of germs present on the skin  o Applied twice a day to shoulder area starting two days before surgery  Chlorhexidine Gluconate (CHG) Soap  o An antiseptic cleaner that kills germs and bonds with the skin to continue killing germs even after washing  o Used for showering the night before surgery and morning of surgery  BENZOYL PEROXIDE 5%  GEL  Please do not use if you have an allergy to benzoyl peroxide. If your skin becomes reddened/irritated stop using the benzoyl peroxide.  Starting two days before surgery, apply as follows:  1. Apply benzoyl peroxide in the morning and at night. Apply after taking a shower. If you are not taking a shower, clean entire shoulder front, back, and side along with the armpit with a clean wet washcloth.  2. Place a quarter-sized dollop on your shoulder and rub in thoroughly, making sure to cover the front, back, and side of your shoulder, along with the armpit.  2 days before ____ AM ____ PM 1 day before ____ AM ____ PM  3. Do this twice a day  for two days. (Last application is the night before surgery, AFTER using the CHG soap).  4. Do NOT apply benzoyl peroxide gel on the day of surgery.  How to Use an Incentive Spirometer An incentive spirometer is a tool that measures how well you are filling your lungs with each breath. Learning to take long, deep breaths using this tool can help you keep your lungs clear and active. This may help to reverse or lessen your chance of developing breathing (pulmonary) problems, especially infection. You may be asked to use a spirometer: After a surgery. If you have a lung problem or a history of smoking. After a long period of time when you have been unable to move or be active. If the spirometer includes an indicator to show the highest number that you have reached, your health care provider or respiratory therapist will help you set a goal. Keep a log of your progress as told by your health care provider. What are the risks? Breathing too quickly may cause dizziness or cause you to pass out. Take your time so you do not get dizzy or light-headed. If you are in pain, you may need to take pain medicine before doing incentive spirometry. It is harder to take a deep breath if you are having pain. How to use your incentive spirometer  Sit up on the edge of  your bed or on a chair. Hold the incentive spirometer so that it is in an upright position. Before you use the spirometer, breathe out normally. Place the mouthpiece in your mouth. Make sure your lips are closed tightly around it. Breathe in slowly and as deeply as you can through your mouth, causing the piston or the ball to rise toward the top of the chamber. Hold your breath for 3-5 seconds, or for as long as possible. If the spirometer includes a coach indicator, use this to guide you in breathing. Slow down your breathing if the indicator goes above the marked areas. Remove the mouthpiece from your mouth and breathe out normally. The piston or ball will return to the bottom of the chamber. Rest for a few seconds, then repeat the steps 10 or more times. Take your time and take a few normal breaths between deep breaths so that you do not get dizzy or light-headed. Do this every 1-2 hours when you are awake. If the spirometer includes a goal marker to show the highest number you have reached (best effort), use this as a goal to work toward during each repetition. After each set of 10 deep breaths, cough a few times. This will help to make sure that your lungs are clear. If you have an incision on your chest or abdomen from surgery, place a pillow or a rolled-up towel firmly against the incision when you cough. This can help to reduce pain while taking deep breaths and coughing. General tips When you are able to get out of bed: Walk around often. Continue to take deep breaths and cough in order to clear your lungs. Keep using the incentive spirometer until your health care provider says it is okay to stop using it. If you have been in the hospital, you may be told to keep using the spirometer at home. Contact a health care provider if: You are having difficulty using the spirometer. You have trouble using the spirometer as often as instructed. Your pain medicine is not giving enough relief for  you to use the spirometer as told. You have a  fever. Get help right away if: You develop shortness of breath. You develop a cough with bloody mucus from the lungs. You have fluid or blood coming from an incision site after you cough. Summary An incentive spirometer is a tool that can help you learn to take long, deep breaths to keep your lungs clear and active. You may be asked to use a spirometer after a surgery, if you have a lung problem or a history of smoking, or if you have been inactive for a long period of time. Use your incentive spirometer as instructed every 1-2 hours while you are awake. If you have an incision on your chest or abdomen, place a pillow or a rolled-up towel firmly against your incision when you cough. This will help to reduce pain. Get help right away if you have shortness of breath, you cough up bloody mucus, or blood comes from your incision when you cough. This information is not intended to replace advice given to you by your health care provider. Make sure you discuss any questions you have with your health care provider. Document Revised: 06/14/2019 Document Reviewed: 06/14/2019 Elsevier Patient Education  2024 [employer].

## 2023-02-26 ENCOUNTER — Encounter: Payer: Self-pay | Admitting: Internal Medicine

## 2023-02-26 ENCOUNTER — Other Ambulatory Visit: Payer: Self-pay | Admitting: Internal Medicine

## 2023-02-26 MED ORDER — ALPRAZOLAM 0.25 MG PO TABS: 0.2500 mg | ORAL_TABLET | Freq: Every day | ORAL | 0 refills | Status: AC | PRN

## 2023-02-26 NOTE — Telephone Encounter (Signed)
Rx sent in for xanax to take prn.  Let me know if any problems.

## 2023-02-26 NOTE — Progress Notes (Signed)
  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 Following analysis of the risk versus benefits, the patient's primary attending surgeon advised that it would be acceptable to discontinue the ordered VANCOMYCIN + LEVOFLOXACIN and place an order for CEFAZOLIN 2 gm IV to be given on call to the OR. Orders for this patient were amended by me following collaborative conversation with attending surgeon taking into consideration of risk versus benefits associated with the change in therapy.  Quentin Mulling, MSN, APRN, FNP-C, CEN [facility]  Perioperative Services Nurse Practitioner Phone: [phone] Fax: [phone] 02/26/23 6:40 AM  NOTE: This note has been prepared using Dragon dictation software. Despite my best ability to proofread, there is always the potential that unintentional transcriptional errors may still occur from this process.

## 2023-03-03 DIAGNOSIS — M75122 Complete rotator cuff tear or rupture of left shoulder, not specified as traumatic: Secondary | ICD-10-CM | POA: Diagnosis not present

## 2023-03-04 ENCOUNTER — Ambulatory Visit: Payer: Medicare PPO

## 2023-03-04 ENCOUNTER — Other Ambulatory Visit: Payer: Self-pay

## 2023-03-04 ENCOUNTER — Encounter
Admission: RE | Disposition: A | Discharge status: Home / Self Care | Payer: Self-pay | Source: Ambulatory Visit | Attending: Surgery

## 2023-03-04 ENCOUNTER — Ambulatory Visit: Payer: Medicare PPO | Admitting: Urgent Care

## 2023-03-04 ENCOUNTER — Ambulatory Visit
Admission: RE | Admit: 2023-03-04 | Discharge: 2023-03-04 | Disposition: A | Discharge status: Home / Self Care | Payer: Medicare PPO | Source: Ambulatory Visit | Attending: Surgery | Admitting: Surgery

## 2023-03-04 ENCOUNTER — Encounter: Payer: Self-pay | Admitting: Surgery

## 2023-03-04 DIAGNOSIS — M7582 Other shoulder lesions, left shoulder: Secondary | ICD-10-CM | POA: Diagnosis not present

## 2023-03-04 DIAGNOSIS — Z471 Aftercare following joint replacement surgery: Secondary | ICD-10-CM | POA: Diagnosis not present

## 2023-03-04 DIAGNOSIS — K219 Gastro-esophageal reflux disease without esophagitis: Secondary | ICD-10-CM | POA: Diagnosis not present

## 2023-03-04 DIAGNOSIS — M12812 Other specific arthropathies, not elsewhere classified, left shoulder: Secondary | ICD-10-CM | POA: Diagnosis not present

## 2023-03-04 DIAGNOSIS — M75102 Unspecified rotator cuff tear or rupture of left shoulder, not specified as traumatic: Secondary | ICD-10-CM

## 2023-03-04 DIAGNOSIS — M75100 Unspecified rotator cuff tear or rupture of unspecified shoulder, not specified as traumatic: Secondary | ICD-10-CM

## 2023-03-04 DIAGNOSIS — Z01812 Encounter for preprocedural laboratory examination: Secondary | ICD-10-CM

## 2023-03-04 DIAGNOSIS — M75122 Complete rotator cuff tear or rupture of left shoulder, not specified as traumatic: Secondary | ICD-10-CM | POA: Insufficient documentation

## 2023-03-04 DIAGNOSIS — Z96612 Presence of left artificial shoulder joint: Secondary | ICD-10-CM | POA: Diagnosis not present

## 2023-03-04 DIAGNOSIS — M7522 Bicipital tendinitis, left shoulder: Secondary | ICD-10-CM | POA: Diagnosis not present

## 2023-03-04 DIAGNOSIS — G8918 Other acute postprocedural pain: Secondary | ICD-10-CM | POA: Diagnosis not present

## 2023-03-04 HISTORY — PX: REVERSE SHOULDER ARTHROPLASTY: SHX5054

## 2023-03-04 SURGERY — ARTHROPLASTY, SHOULDER, TOTAL, REVERSE
Anesthesia: General | Site: Shoulder | Laterality: Left

## 2023-03-04 MED ORDER — CEFAZOLIN SODIUM-DEXTROSE 2-4 GM/100ML-% IV SOLN
2.0000 g | Freq: Once | INTRAVENOUS | Status: AC
  Administered 2023-03-04: 2 g via INTRAVENOUS

## 2023-03-04 MED ORDER — PHENYLEPHRINE 80 MCG/ML (10ML) SYRINGE FOR IV PUSH (FOR BLOOD PRESSURE SUPPORT)
PREFILLED_SYRINGE | INTRAVENOUS | Status: AC
  Filled 2023-03-04: qty 10

## 2023-03-04 MED ORDER — FENTANYL CITRATE PF 50 MCG/ML IJ SOSY
PREFILLED_SYRINGE | INTRAMUSCULAR | Status: AC
  Filled 2023-03-04: qty 1

## 2023-03-04 MED ORDER — ORAL CARE MOUTH RINSE: 15.0000 mL | Freq: Once | OROMUCOSAL | Status: AC

## 2023-03-04 MED ORDER — KETOROLAC TROMETHAMINE 15 MG/ML IJ SOLN
15.0000 mg | Freq: Once | INTRAMUSCULAR | Status: AC
  Administered 2023-03-04: 15 mg via INTRAVENOUS

## 2023-03-04 MED ORDER — SILVER SULFADIAZINE 1 % EX CREA
1.0000 | TOPICAL_CREAM | Freq: Every day | CUTANEOUS | Status: DC
Start: 2023-03-04 — End: 2023-11-27

## 2023-03-04 MED ORDER — ONDANSETRON HCL 4 MG/2ML IJ SOLN
INTRAMUSCULAR | Status: AC
  Filled 2023-03-04: qty 2

## 2023-03-04 MED ORDER — RABEPRAZOLE SODIUM 20 MG PO TBEC: 20.0000 mg | DELAYED_RELEASE_TABLET | Freq: Two times a day (BID) | ORAL | Status: DC

## 2023-03-04 MED ORDER — MIDAZOLAM HCL 2 MG/2ML IJ SOLN
INTRAMUSCULAR | Status: AC
Start: 2023-03-04 — End: ?
  Filled 2023-03-04: qty 2

## 2023-03-04 MED ORDER — KETOROLAC TROMETHAMINE 15 MG/ML IJ SOLN
INTRAMUSCULAR | Status: AC
  Filled 2023-03-04: qty 1

## 2023-03-04 MED ORDER — TRANEXAMIC ACID-NACL 1000-0.7 MG/100ML-% IV SOLN
1000.0000 mg | INTRAVENOUS | Status: AC
  Administered 2023-03-04: 1000 mg via INTRAVENOUS

## 2023-03-04 MED ORDER — FENTANYL CITRATE (PF) 100 MCG/2ML IJ SOLN
INTRAMUSCULAR | Status: DC | PRN
  Administered 2023-03-04: 50 ug via INTRAVENOUS
  Administered 2023-03-04 (×2): 25 ug via INTRAVENOUS

## 2023-03-04 MED ORDER — ROCURONIUM BROMIDE 100 MG/10ML IV SOLN
INTRAVENOUS | Status: DC | PRN
  Administered 2023-03-04: 50 mg via INTRAVENOUS

## 2023-03-04 MED ORDER — ACETAMINOPHEN 10 MG/ML IV SOLN
INTRAVENOUS | Status: AC
  Filled 2023-03-04: qty 100

## 2023-03-04 MED ORDER — OXYCODONE HCL 5 MG PO TABS: 5.0000 mg | ORAL_TABLET | ORAL | 0 refills | Status: DC | PRN

## 2023-03-04 MED ORDER — LIDOCAINE HCL (PF) 2 % IJ SOLN
INTRAMUSCULAR | Status: AC
  Filled 2023-03-04: qty 5

## 2023-03-04 MED ORDER — PROPOFOL 10 MG/ML IV BOLUS
INTRAVENOUS | Status: DC | PRN
  Administered 2023-03-04: 90 mg via INTRAVENOUS

## 2023-03-04 MED ORDER — ACETAMINOPHEN 10 MG/ML IV SOLN
INTRAVENOUS | Status: DC | PRN
  Administered 2023-03-04: 1000 mg via INTRAVENOUS

## 2023-03-04 MED ORDER — ONDANSETRON HCL 4 MG/2ML IJ SOLN: 4.0000 mg | Freq: Four times a day (QID) | INTRAMUSCULAR | Status: DC | PRN

## 2023-03-04 MED ORDER — DEXAMETHASONE SODIUM PHOSPHATE 10 MG/ML IJ SOLN
INTRAMUSCULAR | Status: DC | PRN
  Administered 2023-03-04: 10 mg via INTRAVENOUS

## 2023-03-04 MED ORDER — PHENYLEPHRINE 80 MCG/ML (10ML) SYRINGE FOR IV PUSH (FOR BLOOD PRESSURE SUPPORT)
PREFILLED_SYRINGE | INTRAVENOUS | Status: DC | PRN
  Administered 2023-03-04: 80 ug via INTRAVENOUS
  Administered 2023-03-04: 20 ug via INTRAVENOUS
  Administered 2023-03-04: 40 ug via INTRAVENOUS

## 2023-03-04 MED ORDER — PHENYLEPHRINE HCL-NACL 20-0.9 MG/250ML-% IV SOLN
INTRAVENOUS | Status: DC | PRN
  Administered 2023-03-04: 80 ug/min via INTRAVENOUS

## 2023-03-04 MED ORDER — ACETAMINOPHEN 325 MG PO TABS: 325.0000 mg | ORAL_TABLET | Freq: Four times a day (QID) | ORAL | Status: DC | PRN

## 2023-03-04 MED ORDER — OXYCODONE HCL 5 MG PO TABS
5.0000 mg | ORAL_TABLET | ORAL | Status: DC | PRN
Start: 2023-03-04 — End: 2023-03-04

## 2023-03-04 MED ORDER — METOCLOPRAMIDE HCL 5 MG/ML IJ SOLN: 5.0000 mg | Freq: Three times a day (TID) | INTRAMUSCULAR | Status: DC | PRN

## 2023-03-04 MED ORDER — MUPIROCIN 2 % EX OINT: 1.0000 | TOPICAL_OINTMENT | Freq: Every day | CUTANEOUS | Status: DC | PRN

## 2023-03-04 MED ORDER — SODIUM CHLORIDE FLUSH 0.9 % IV SOLN
INTRAVENOUS | Status: AC
  Filled 2023-03-04: qty 10

## 2023-03-04 MED ORDER — METOCLOPRAMIDE HCL 10 MG PO TABS: 5.0000 mg | ORAL_TABLET | Freq: Three times a day (TID) | ORAL | Status: DC | PRN

## 2023-03-04 MED ORDER — BUPIVACAINE LIPOSOME 1.3 % IJ SUSP
INTRAMUSCULAR | Status: AC
  Filled 2023-03-04: qty 10

## 2023-03-04 MED ORDER — FENTANYL CITRATE (PF) 100 MCG/2ML IJ SOLN: 25.0000 ug | INTRAMUSCULAR | Status: DC | PRN

## 2023-03-04 MED ORDER — CHLORHEXIDINE GLUCONATE 0.12 % MT SOLN
15.0000 mL | Freq: Once | OROMUCOSAL | Status: AC
Start: 2023-03-04 — End: 2023-03-04
  Administered 2023-03-04: 15 mL via OROMUCOSAL

## 2023-03-04 MED ORDER — DEXTROSE-SODIUM CHLORIDE 5-0.9 % IV SOLN: INTRAVENOUS | Status: DC

## 2023-03-04 MED ORDER — CEFAZOLIN SODIUM-DEXTROSE 2-4 GM/100ML-% IV SOLN
INTRAVENOUS | Status: AC
  Filled 2023-03-04: qty 100

## 2023-03-04 MED ORDER — OXYCODONE HCL 5 MG/5ML PO SOLN: 5.0000 mg | Freq: Once | ORAL | Status: DC | PRN

## 2023-03-04 MED ORDER — FENTANYL CITRATE PF 50 MCG/ML IJ SOSY
50.0000 ug | PREFILLED_SYRINGE | Freq: Once | INTRAMUSCULAR | Status: AC
  Administered 2023-03-04: 50 ug via INTRAVENOUS

## 2023-03-04 MED ORDER — BUPIVACAINE-EPINEPHRINE (PF) 0.5% -1:200000 IJ SOLN
INTRAMUSCULAR | Status: AC
  Filled 2023-03-04: qty 30

## 2023-03-04 MED ORDER — PROPOFOL 10 MG/ML IV BOLUS
INTRAVENOUS | Status: AC
  Filled 2023-03-04: qty 20

## 2023-03-04 MED ORDER — CEFAZOLIN SODIUM-DEXTROSE 2-4 GM/100ML-% IV SOLN
2.0000 g | Freq: Four times a day (QID) | INTRAVENOUS | Status: DC
Start: 2023-03-04 — End: 2023-03-04
  Administered 2023-03-04: 2 g via INTRAVENOUS

## 2023-03-04 MED ORDER — ONDANSETRON HCL 4 MG PO TABS: 4.0000 mg | ORAL_TABLET | Freq: Four times a day (QID) | ORAL | Status: DC | PRN

## 2023-03-04 MED ORDER — MIDAZOLAM HCL 2 MG/2ML IJ SOLN
1.0000 mg | Freq: Once | INTRAMUSCULAR | Status: AC
  Administered 2023-03-04: 1 mg via INTRAVENOUS

## 2023-03-04 MED ORDER — OXYCODONE HCL 5 MG PO TABS: 5.0000 mg | ORAL_TABLET | Freq: Once | ORAL | Status: DC | PRN

## 2023-03-04 MED ORDER — BUPIVACAINE-EPINEPHRINE (PF) 0.5% -1:200000 IJ SOLN
INTRAMUSCULAR | Status: DC | PRN
  Administered 2023-03-04: 30 mL

## 2023-03-04 MED ORDER — LIDOCAINE HCL (CARDIAC) PF 100 MG/5ML IV SOSY
PREFILLED_SYRINGE | INTRAVENOUS | Status: DC | PRN
  Administered 2023-03-04: 100 mg via INTRAVENOUS

## 2023-03-04 MED ORDER — ONDANSETRON HCL 4 MG/2ML IJ SOLN
INTRAMUSCULAR | Status: DC | PRN
  Administered 2023-03-04: 4 mg via INTRAVENOUS

## 2023-03-04 MED ORDER — CHLORHEXIDINE GLUCONATE 0.12 % MT SOLN
OROMUCOSAL | Status: AC
  Filled 2023-03-04: qty 15

## 2023-03-04 MED ORDER — SUGAMMADEX SODIUM 200 MG/2ML IV SOLN
INTRAVENOUS | Status: DC | PRN
  Administered 2023-03-04: 140 mg via INTRAVENOUS

## 2023-03-04 MED ORDER — FENTANYL CITRATE (PF) 100 MCG/2ML IJ SOLN
INTRAMUSCULAR | Status: AC
  Filled 2023-03-04: qty 2

## 2023-03-04 MED ORDER — LACTATED RINGERS IV SOLN: INTRAVENOUS | Status: DC

## 2023-03-04 MED ORDER — BUPIVACAINE HCL (PF) 0.5 % IJ SOLN
INTRAMUSCULAR | Status: AC
  Filled 2023-03-04: qty 10

## 2023-03-04 MED ORDER — DEXAMETHASONE SODIUM PHOSPHATE 10 MG/ML IJ SOLN
INTRAMUSCULAR | Status: AC
  Filled 2023-03-04: qty 1

## 2023-03-04 MED ORDER — TRANEXAMIC ACID-NACL 1000-0.7 MG/100ML-% IV SOLN
INTRAVENOUS | Status: AC
  Filled 2023-03-04: qty 100

## 2023-03-04 MED ORDER — ROCURONIUM BROMIDE 10 MG/ML (PF) SYRINGE
PREFILLED_SYRINGE | INTRAVENOUS | Status: AC
  Filled 2023-03-04: qty 10

## 2023-03-04 MED ORDER — OTEZLA 30 MG PO TABS: 30.0000 mg | ORAL_TABLET | Freq: Every evening | ORAL | Status: DC

## 2023-03-04 MED ORDER — LIDOCAINE HCL (PF) 1 % IJ SOLN
INTRAMUSCULAR | Status: AC
  Filled 2023-03-04: qty 5

## 2023-03-04 SURGICAL SUPPLY — 65 items
BIT DRILL 3 FOR 4.5 SCREW (BIT) ×1
BIT DRILL 3MM FOR 4.5 SCREW (BIT) IMPLANT
BLADE SAW SAG 25X90X1.19 (BLADE) ×2 IMPLANT
BSPLAT GLND RVRS SHLDR CENTRD (Plate) ×1 IMPLANT
CHLORAPREP W/TINT 26 (MISCELLANEOUS) ×2 IMPLANT
COOLER POLAR GLACIER W/PUMP (MISCELLANEOUS) ×2 IMPLANT
DRAPE INCISE IOBAN 66X45 STRL (DRAPES) ×2 IMPLANT
DRAPE SHEET LG 3/4 BI-LAMINATE (DRAPES) ×2 IMPLANT
DRAPE TABLE BACK 80X90 (DRAPES) ×2 IMPLANT
DRILL BIT 3MM FOR 4.5 SCREW (BIT) ×1
DRSG OPSITE POSTOP 4X8 (GAUZE/BANDAGES/DRESSINGS) ×2 IMPLANT
ELECT BLADE 6.5 EXT (BLADE) IMPLANT
ELECT CAUTERY BLADE 6.4 (BLADE) ×2 IMPLANT
ELECT REM PT RETURN 9FT ADLT (ELECTROSURGICAL) ×1
ELECTRODE REM PT RTRN 9FT ADLT (ELECTROSURGICAL) ×2 IMPLANT
GAUZE XEROFORM 1X8 LF (GAUZE/BANDAGES/DRESSINGS) ×2 IMPLANT
GLENOID BSPLAT RVS SHLDR CNTRD (Plate) IMPLANT
GLENOSPHERE CON AETOS 38 RSS (Shoulder) IMPLANT
GLOVE BIO SURGEON STRL SZ7.5 (GLOVE) ×8 IMPLANT
GLOVE BIO SURGEON STRL SZ8 (GLOVE) ×8 IMPLANT
GLOVE BIOGEL PI IND STRL 8 (GLOVE) ×4 IMPLANT
GLOVE INDICATOR 8.0 STRL GRN (GLOVE) ×2 IMPLANT
GOWN STRL REUS W/ TWL LRG LVL3 (GOWN DISPOSABLE) ×2 IMPLANT
GOWN STRL REUS W/ TWL XL LVL3 (GOWN DISPOSABLE) ×2 IMPLANT
GUIDE PIN GLENOID 2.5 NT (PIN) ×1
HANDLE YANKAUER SUCT OPEN TIP (MISCELLANEOUS) ×2 IMPLANT
HOOD PEEL AWAY T7 (MISCELLANEOUS) ×6 IMPLANT
IV NS IRRIG 3000ML ARTHROMATIC (IV SOLUTION) ×2 IMPLANT
KIT STABILIZATION SHOULDER (MISCELLANEOUS) ×2 IMPLANT
KIT TURNOVER KIT A (KITS) ×2 IMPLANT
LINER REVERSE RSS 38 LG +0 (Liner) IMPLANT
MANIFOLD NEPTUNE II (INSTRUMENTS) ×2 IMPLANT
MASK FACE SPIDER DISP (MASK) ×2 IMPLANT
MAT ABSORB FLUID 56X50 GRAY (MISCELLANEOUS) ×2 IMPLANT
NDL MAYO CATGUT SZ1 (NEEDLE) IMPLANT
NDL SAFETY ECLIPSE 18X1.5 (NEEDLE) ×2 IMPLANT
NDL SPNL 20GX3.5 QUINCKE YW (NEEDLE) ×2 IMPLANT
NEEDLE MAYO CATGUT SZ1 (NEEDLE) IMPLANT
NEEDLE SPNL 20GX3.5 QUINCKE YW (NEEDLE) ×1 IMPLANT
NS IRRIG 500ML POUR BTL (IV SOLUTION) ×2 IMPLANT
PACK ARTHROSCOPY SHOULDER (MISCELLANEOUS) ×2 IMPLANT
PAD ARMBOARD 7.5X6 YLW CONV (MISCELLANEOUS) ×2 IMPLANT
PAD WRAPON POLAR SHDR UNIV (MISCELLANEOUS) ×2 IMPLANT
PIN GUIDE GLENOID 2.5 NT (PIN) IMPLANT
PULSAVAC PLUS IRRIG FAN TIP (DISPOSABLE) ×1
SCREW CENTER 4.5X30 RSS (Screw) IMPLANT
SCREW LOCK 4.5X15 RSS (Screw) IMPLANT
SCREW LOCKING 4.5X20 RSS (Screw) IMPLANT
SCREW LOCKING 4.5X30 RSS (Screw) IMPLANT
SLING ULTRA II M (MISCELLANEOUS) IMPLANT
SPONGE T-LAP 18X18 ~~LOC~~+RFID (SPONGE) ×4 IMPLANT
STAPLER SKIN PROX 35W (STAPLE) ×2 IMPLANT
STEM HUM AETOS SZ3 LG RSS (Stem) IMPLANT
SUT ETHIBOND 0 MO6 C/R (SUTURE) ×2 IMPLANT
SUT FIBERWIRE #2 38 BLUE 1/2 (SUTURE) ×4
SUT VIC AB 0 CT1 36 (SUTURE) ×2 IMPLANT
SUT VIC AB 2-0 CT1 TAPERPNT 27 (SUTURE) ×4 IMPLANT
SUTURE FIBERWR #2 38 BLUE 1/2 (SUTURE) ×8 IMPLANT
SYR 10ML LL (SYRINGE) ×2 IMPLANT
SYR 30ML LL (SYRINGE) ×2 IMPLANT
SYR TOOMEY 50ML (SYRINGE) ×2 IMPLANT
TIP FAN IRRIG PULSAVAC PLUS (DISPOSABLE) ×2 IMPLANT
TRAP FLUID SMOKE EVACUATOR (MISCELLANEOUS) ×2 IMPLANT
WATER STERILE IRR 500ML POUR (IV SOLUTION) ×2 IMPLANT
WRAPON POLAR PAD SHDR UNIV (MISCELLANEOUS) ×1

## 2023-03-04 NOTE — Evaluation (Signed)
Occupational Therapy Evaluation Patient Details Name: April Santos MRN: [id number] DOB: 06-21-1955 Today's Date: 03/04/2023   History of Present Illness 67yo female s/p reverse L TSA on 03/04/23.   Clinical Impression   Patient was seen for an OT evaluation this date. Pt lives with her spouse who will be able to provide assist upon discharge.  Pt has orders for LUE to be immobilized and will be NWBing per MD. Patient presents with impaired strength/ROM, pain, and sensation to LUE.  These impairments result in a decreased ability to perform self care tasks requiring MOD A for UB ADL, PRN MIN A for LB ADL, and MAX A for compression stockings, polar care, and shoulder sling/immobilizer mgt. She endorses some grogginess/sleepiness following surgery requiring intermittent VC to keep eyes open and for safety with mobility to ensure she doesn't bump into anything with her LUE. Does well with commands and VC. Pt/spouse instructed in polar care mgt, compression stockings mgt, sling/immobilizer mgt, LUE precautions, adaptive strategies for bathing/dressing/toileting/grooming, positioning and considerations for sleep, pet care considerations, and home/routines modifications to maximize falls prevention, safety, and independence. Handout provided. OT adjusted sling/immobilizer and polar care to improve comfort, optimize positioning, and to maximize skin integrity/safety. Spouse demonstrated excellent carryover of learned techniques during UB dressing, polar care mgt, and sling mgt. CGA for ADL mobility without AD and 1 VC for safety while walking. Pt/spouse verbalized understanding of all education/training provided. Pt will benefit from skilled rehab services as arranged by the surgeon to continue therapy to maximize return to PLOF, address home/routines modifications and safety, minimize falls risk, and minimize caregiver burden.       If plan is discharge home, recommend the following: A little help with  walking and/or transfers;A lot of help with bathing/dressing/bathroom;Assistance with cooking/housework;Assist for transportation;Help with stairs or ramp for entrance    Functional Status Assessment  Patient has had a recent decline in their functional status and demonstrates the ability to make significant improvements in function in a reasonable and predictable amount of time.  Equipment Recommendations  Other (comment) (consider handheld showerhead in walk in shower, and shower head holder)    Recommendations for Other Services       Precautions / Restrictions Precautions Precautions: Shoulder;Fall Shoulder Interventions: Shoulder sling/immobilizer;Shoulder abduction pillow;At all times;Off for dressing/bathing/exercises Precaution Booklet Issued: Yes (comment) Restrictions Weight Bearing Restrictions: Yes LUE Weight Bearing: Non weight bearing      Mobility Bed Mobility               General bed mobility comments: NT in recliner    Transfers Overall transfer level: Needs assistance Equipment used: None Transfers: Sit to/from Stand Sit to Stand: Contact guard assist, Supervision                  Balance Overall balance assessment: Needs assistance Sitting-balance support: No upper extremity supported, Feet supported Sitting balance-Leahy Scale: Good     Standing balance support: During functional activity, No upper extremity supported Standing balance-Leahy Scale: Fair                             ADL either performed or assessed with clinical judgement   ADL Overall ADL's : Needs assistance/impaired                                       General ADL Comments:  Pt currently requires MOD A for UB ADL and PRN MIN A for LB ADL tasks 2/2 decreased functional use of LUE following surgery. Spouse demonstrates excellent support and recall of instruction provided for hemi dressing techniques, polar care mgt, and shoulder  sling/immobilizer mgt.     Vision         Perception         Praxis         Pertinent Vitals/Pain Pain Assessment Pain Assessment: 0-10 Pain Score: 5  Pain Location: R hip Pain Descriptors / Indicators: Aching Pain Intervention(s): Limited activity within patient's tolerance, Monitored during session, Repositioned, Patient requesting pain meds-RN notified     Extremity/Trunk Assessment Upper Extremity Assessment Upper Extremity Assessment: Overall WFL for tasks assessed;LUE deficits/detail LUE Deficits / Details: N/T to L hand, all else remains numb LUE: Unable to fully assess due to immobilization LUE Sensation: decreased light touch;decreased proprioception LUE Coordination: decreased gross motor;decreased fine motor   Lower Extremity Assessment Lower Extremity Assessment: Overall WFL for tasks assessed       Communication Communication Cueing Techniques: Verbal cues   Cognition Arousal: Suspect due to medications Behavior During Therapy: WFL for tasks assessed/performed Overall Cognitive Status: Within Functional Limits for tasks assessed                                 General Comments: grossly WFL, just a bit sleepy/groggy     General Comments       Exercises Other Exercises Other Exercises: Pt/spouse instructed in home/routines modifications, compression stocking mgt, falls prevention, AE/DME, shoulder precautions and how to maintain during ADL including hemi techniques for dressing and bathing, shoulder sling mgt, and polar care mgt. Hands on practice with spouse able to return demo proper techniques with PRN VC. HAndout provided.   Shoulder Instructions      Home Living Family/patient expects to be discharged to:: Private residence Living Arrangements: Spouse/significant other Available Help at Discharge: Family;Available 24 hours/day Type of Home: House Home Access: Stairs to enter [employer] of Steps: 1          Bathroom Shower/Tub: Walk-in shower;Tub/shower unit   Bathroom Toilet: Handicapped height     Home Equipment: Shower seat;Hand held shower head;Adaptive equipment Adaptive Equipment: Reacher        Prior Functioning/Environment Prior Level of Function : Independent/Modified Independent                        OT Problem List: Decreased strength;Decreased range of motion;Impaired sensation;Impaired UE functional use;Impaired balance (sitting and/or standing)      OT Treatment/Interventions:      OT Goals(Current goals can be found in the care plan section) Acute Rehab OT Goals Patient Stated Goal: go home OT Goal Formulation: All assessment and education complete, DC therapy  OT Frequency:      Co-evaluation              AM-PAC OT "6 Clicks" Daily Activity     Outcome Measure Help from another person eating meals?: A Little Help from another person taking care of personal grooming?: A Little Help from another person toileting, which includes using toliet, bedpan, or urinal?: A Little Help from another person bathing (including washing, rinsing, drying)?: A Lot Help from another person to put on and taking off regular upper body clothing?: A Lot Help from another person to put on and taking off regular lower  body clothing?: A Little 6 Click Score: 16   End of Session Nurse Communication: Mobility status  Activity Tolerance: Patient tolerated treatment well Patient left: in chair;with call bell/phone within reach;with nursing/sitter in room;with family/visitor present;Other (comment) (sling and polar care in place)  OT Visit Diagnosis: Other abnormalities of gait and mobility (R26.89)                Time: 1308-6578 OT Time Calculation (min): 52 min Charges:  OT General Charges $OT Visit: 1 Visit OT Evaluation $OT Eval Moderate Complexity: 1 Mod OT Treatments $Self Care/Home Management : 38-52 mins  Arman Filter., MPH, MS, OTR/L ascom  [phone] 03/04/23, 4:23 PM

## 2023-03-04 NOTE — Discharge Instructions (Addendum)
POLAR CARE INFORMATION  MassAdvertisement.it  How to use [employer] Polar Care [facility]?  YouTube   ShippingScam.co.uk  OPERATING INSTRUCTIONS  Start the product With dry hands, connect the transformer to the electrical connection located on the top of the cooler. Next, plug the transformer into an appropriate electrical outlet. The unit will automatically start running at this point.  To stop the pump, disconnect electrical power.  Unplug to stop the product when not in use. Unplugging the Polar Care unit turns it off. Always unplug immediately after use. Never leave it plugged in while unattended. Remove pad.    FIRST ADD WATER TO FILL LINE, THEN ICE---Replace ice when existing ice is almost melted  1 Discuss Treatment with your Licensed Health Care Practitioner and Use Only as Prescribed 2 Apply Insulation Barrier & Cold Therapy Pad 3 Check for Moisture 4 Inspect Skin Regularly  Tips and Trouble Shooting Usage Tips 1. Use cubed or chunked ice for optimal performance. 2. It is recommended to drain the Pad between uses. To drain the pad, hold the Pad upright with the hose pointed toward the ground. Depress the black plunger and allow water to drain out. 3. You may disconnect the Pad from the unit without removing the pad from the affected area by depressing the silver tabs on the hose coupling and gently pulling the hoses apart. The Pad and unit will seal itself and will not leak. Note: Some dripping during release is normal. 4. DO NOT RUN PUMP WITHOUT WATER! The pump in this unit is designed to run with water. Running the unit without water will cause permanent damage to the pump. 5. Unplug unit before removing lid.  TROUBLESHOOTING GUIDE Pump not running, Water not flowing to the pad, Pad is not getting cold 1. Make sure the transformer is plugged into the wall outlet. 2. Confirm that the ice and water are filled to the indicated levels. 3. Make sure  there are no kinks in the pad. 4. Gently pull on the blue tube to make sure the tube/pad junction is straight. 5. Remove the pad from the treatment site and ll it while the pad is lying at; then reapply. 6. Confirm that the pad couplings are securely attached to the unit. Listen for the double clicks (Figure 1) to confirm the pad couplings are securely attached.  Leaks    Note: Some condensation on the lines, controller, and pads is unavoidable, especially in warmer climates. 1. If using a [employer] Polar Care Cold Therapy unit with a detachable Cold Therapy Pad, and a leak exists (other than condensation on the lines) disconnect the pad couplings. Make sure the silver tabs on the couplings are depressed before reconnecting the pad to the pump hose; then confirm both sides of the coupling are properly clicked in. 2. If the coupling continues to leak or a leak is detected in the pad itself, stop using it and call [employer] Customer Care at [phone].  Cleaning After use, empty and dry the unit with a soft cloth. Warm water and mild detergent may be used occasionally to clean the pump and tubes.  WARNING: The Polar Care Cube can be cold enough to cause serious injury, including full skin necrosis. Follow these Operating Instructions, and carefully read the Product Insert (see pouch on side of unit) and the Cold Therapy Pad Fitting Instructions (provided with each Cold Therapy Pad) prior to use.       SHOULDER SLING IMMOBILIZER   VIDEO Slingshot 2  Shoulder Brace Application - YouTube ---https://www.porter.info/  INSTRUCTIONS While supporting the injured arm, slide the forearm into the sling. Wrap the adjustable shoulder strap around the neck and shoulders and attach the strap end to the sling using  the "alligator strap tab."  Adjust the shoulder strap to the required length. Position the shoulder pad behind the neck. To secure the shoulder pad location (optional), pull the  shoulder strap away from the shoulder pad, unfold the hook material on the top of the pad, then press the shoulder strap back onto the hook material to secure the pad in place. Attach the closure strap across the open top of the sling. Position the strap so that it holds the arm securely in the sling. Next, attach the thumb strap to the open end of the sling between the thumb and fingers. After sling has been fit, it may be easily removed and reapplied using the quick release buckle on shoulder strap. If a neutral pillow or 15 abduction pillow is included, place the pillow at the waistline. Attach the sling to the pillow, lining up hook material on the pillow with the loop on sling. Adjust the waist strap to fit.  If waist strap is too long, cut it to fit. Use the small piece of double sided hook material (located on top of the pillow) to secure the strap end. Place the double sided hook material on the inside of the cut strap end and secure it to the waist strap.     If no pillow is included, attach the waist strap to the sling and adjust to fit.    Washing Instructions: Straps and sling must be removed and cleaned regularly depending on your activity level and perspiration. Hand wash straps and sling in cold water with mild detergent, rinse, air dry     Orthopedic discharge instructions: May shower with intact OpSite dressing once nerve block has worn off (around Saturday).  Apply ice frequently to shoulder or use Polar Care device. Take ibuprofen 600-800 mg TID with meals for 3-5 days, then as necessary. Take oxycodone as prescribed when needed.  May supplement with ES Tylenol if necessary. Keep shoulder immobilizer on at all times except may remove for bathing purposes. Follow-up in 10-14 days or as scheduled.

## 2023-03-04 NOTE — Anesthesia Preprocedure Evaluation (Signed)
Anesthesia Evaluation  Patient identified by MRN, date of birth, ID band Patient awake    Reviewed: Allergy & Precautions, NPO status , Patient's Chart, lab work & pertinent test results  History of Anesthesia Complications Negative for: history of anesthetic complications  Airway Mallampati: III  TM Distance: >3 FB Neck ROM: full    Dental no notable dental hx.    Pulmonary neg pulmonary ROS   Pulmonary exam normal        Cardiovascular negative cardio ROS Normal cardiovascular exam     Neuro/Psych negative neurological ROS  negative psych ROS   GI/Hepatic Neg liver ROS,GERD  Controlled,,  Endo/Other  negative endocrine ROS    Renal/GU      Musculoskeletal  (+) Arthritis ,    Abdominal   Peds  Hematology negative hematology ROS (+)   Anesthesia Other Findings Past Medical History: No date: Arthritis No date: Colon polyps 2022: COVID-19 No date: Disc disorder No date: Dysphagia No date: Family history of adverse reaction to anesthesia     Comment:  mother nausea No date: GERD (gastroesophageal reflux disease) No date: History of uterine fibroid No date: Psoriasis  Past Surgical History: No date: ABDOMINAL HYSTERECTOMY 1999: BREAST SURGERY     Comment:  biopsy 2005: carpal     Comment:  Tunnel right hand 11/16/2020: CARPOMETACARPAL (CMC) FUSION OF THUMB; Right     Comment:  Procedure: Right CARPOMETACARPAL [facility]) ARTHROPLASTY;                Surgeon: Kennedy Bucker, MD;  Location: [facility] ORS;                Service: Orthopedics;  Laterality: Right; No date: COLONOSCOPY WITH ESOPHAGOGASTRODUODENOSCOPY (EGD) 06/10/2016: COLONOSCOPY WITH PROPOFOL; N/A     Comment:  Procedure: COLONOSCOPY WITH PROPOFOL;  Surgeon: Scot Jun, MD;  Location: [facility] ENDOSCOPY;  Service:               Endoscopy;  Laterality: N/A; 06/10/2016: ESOPHAGOGASTRODUODENOSCOPY (EGD) WITH PROPOFOL; N/A     Comment:   Procedure: ESOPHAGOGASTRODUODENOSCOPY (EGD) WITH               PROPOFOL;  Surgeon: Scot Jun, MD;  Location: [facility]              ENDOSCOPY;  Service: Endoscopy;  Laterality: N/A; 02/07/1003: FOOT SURGERY     Comment:  right foot benign tumor 08/17/13 & 04/13/14: SACROILIAC JOINT INJECTION     Comment:  Dr. Yves Dill No date: SIGMOIDOSCOPY 1997: UTERINE FIBROID SURGERY  BMI    Body Mass Index: 25.41 kg/m      Reproductive/Obstetrics negative OB ROS                             Anesthesia Physical Anesthesia Plan  ASA: 2  Anesthesia Plan: General ETT   Post-op Pain Management: Regional block*, Ofirmev IV (intra-op)* and Toradol IV (intra-op)*   Induction: Intravenous  PONV Risk Score and Plan: 3 and Ondansetron, Dexamethasone, Midazolam and Treatment may vary due to age or medical condition  Airway Management Planned: Oral ETT  Additional Equipment:   Intra-op Plan:   Post-operative Plan: Extubation in OR  Informed Consent: I have reviewed the patients History and Physical, chart, labs and discussed the procedure including the risks, benefits and alternatives for the proposed anesthesia with the patient or authorized representative who has  indicated his/her understanding and acceptance.     Dental Advisory Given  Plan Discussed with: Anesthesiologist, CRNA and Surgeon  Anesthesia Plan Comments: (Patient consented for risks of anesthesia including but not limited to:  - adverse reactions to medications - damage to eyes, teeth, lips or other oral mucosa - nerve damage due to positioning  - sore throat or hoarseness - Damage to heart, brain, nerves, lungs, other parts of body or loss of life  Patient voiced understanding and assent.)       Anesthesia Quick Evaluation

## 2023-03-04 NOTE — Anesthesia Procedure Notes (Signed)
Anesthesia Regional Block: Interscalene brachial plexus block   Pre-Anesthetic Checklist: , timeout performed,  Correct Patient, Correct Site, Correct Laterality,  Correct Procedure, Correct Position, site marked,  Risks and benefits discussed,  Surgical consent,  Pre-op evaluation,  At surgeon's request and post-op pain management  Laterality: Left  Prep: alcohol swabs       Needles:  Injection technique: Single-shot  Needle Type: Echogenic Needle     Needle Length: 4cm  Needle Gauge: 25     Additional Needles:   Procedures:,,,, ultrasound used (permanent image in chart),,    Narrative:  Start time: 03/04/2023 10:08 AM End time: 03/04/2023 10:10 AM Injection made incrementally with aspirations every 5 mL.  Performed by: Personally  Anesthesiologist: Louie Boston, MD  Additional Notes: Patient's chart reviewed and they were deemed appropriate candidate for procedure, at surgeon's request. Patient educated about risks, benefits, and alternatives of the block including but not limited to: temporary or permanent nerve damage, bleeding, infection, damage to surround tissues, pneumothorax, hemidiaphragmatic paralysis, unilateral Horner's syndrome, block failure, local anesthetic toxicity. Patient expressed understanding. A formal time-out was conducted consistent with institution rules.  Monitors were applied, and minimal sedation used (see nursing record). The site was prepped with skin prep and allowed to dry, and sterile gloves were used. A high frequency linear ultrasound probe with probe cover was utilized throughout. C5-7 nerve roots located and appeared anatomically normal, local anesthetic injected around them, and echogenic block needle trajectory was monitored throughout. Aspiration performed every 5ml. Lung and blood vessels were avoided. All injections were performed without resistance and free of blood and paresthesias. The patient tolerated the procedure  well.  Injectate: 10ml exparel + 10ml 0.5% bupivacaine

## 2023-03-04 NOTE — Op Note (Signed)
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  Post-Op Diagnosis:   Same with biceps tendinopathy.  Procedure:   Reverse right total shoulder arthroplasty with biceps tenodesis.  Surgeon:   Maryagnes Amos, MD  Assistant:   Horris Latino, PA-C  Anesthesia:   General endotracheal with an interscalene block using Exparel placed preoperatively by the anesthesiologist.  Findings:   As above.  Complications:   None  EBL:   100 cc  Fluids:   500 cc crystalloid  UOP:   None  TT:   None  Drains:   None  Closure:   Staples  Implants:   All press-fit [employer] AETOS system with a size #3 small Meta humeral stem, a 38 mm +0 mm liner, a centered baseplate, and a 38 mm concentric +1 mm laterally offset glenosphere.  Brief Clinical Note:   The patient is a 67 year old female with a history of progressively worsening pain and weakness of the left shoulder.  The symptoms have persisted despite medications, activity modification, etc. The patient's symptoms are consistent with a massive rotator cuff tear and early cuff arthropathy, all of which were confirmed by preoperative imaging. The patient presents at this time for a reverse left total shoulder arthroplasty.  Procedure:   The patient underwent placement of an interscalene block by the anesthesiologist using Exparel in the preoperative holding area before being brought into the operating room and lain in the supine position. The patient then underwent general endotracheal intubation and anesthesia before the patient was repositioned in the beach chair position using the beach chair positioner. The right shoulder and upper extremity were prepped with ChloraPrep solution before being draped sterilely. Preoperative antibiotics were administered.   A timeout was performed to verify the appropriate surgical site before a  standard anterior approach to the shoulder was made through an approximately 3.5-4 inch incision. The incision was carried down through the subcutaneous tissues to expose the deltopectoral fascia. The interval between the deltoid and pectoralis muscles was identified and this plane developed, retracting the cephalic vein laterally with the deltoid muscle. The conjoined tendon was identified. Its lateral margin was dissected and the Kolbel self-retraining retractor inserted. The "three sisters" were identified and cauterized. Bursal tissues were removed to improve visualization.   The biceps tendon was identified near the inferior aspect of the bicipital groove. A soft tissue tenodesis was performed by attaching the biceps tendon to the adjacent pectoralis major tendon using two #0 Ethibond interrupted sutures. The biceps tendon was then transected just proximal to the tenodesis site. The subscapularis tendon was released from its attachment to the lesser tuberosity 1 cm proximal to its insertion and several tagging sutures placed. The inferior capsule was released with care after identifying and protecting the axillary nerve. The proximal humeral cut was made at approximately 20 of retroversion using the extra-medullary guide.   Attention was directed to the humeral side. The humeral cut surface was sized and found to be optimally replicated by the #2 sized component.  The central guide wire was inserted before the metaphyseal region was reamed with the appropriate reamer.  The punch was then inserted over the guidewire before the keel was inserted to create the proper position for the fins.  The trial #2 metaphyseal humeral stem was inserted, then covered with the appropriate cover.   Attention was redirected to the glenoid. The labrum  was debrided circumferentially before the center of the glenoid was identified. The guidewire was drilled into the glenoid neck using the appropriate guide. After verifying  its position, it was overreamed with the mini-baseplate reamer to create a flat surface before the stem reamer was utilized. The permanent mini-baseplate was impacted into place. It was stabilized with a 35 x 4.5 mm central screw and four peripheral locking screws. The permanent 38 mm concentric glenosphere with +1 mm of lateral offset was then impacted into place and its Morse taper locking mechanism verified using manual distraction.  The central set screw was then advanced and tightened securely.  Returning to the humeral side, the cover plate was removed and the trial +0 liner inserted. The shoulder was then reduced and placed through a range of motion. With the +0 mm insert, the arm demonstrated excellent range of motion as the hand could be brought across the chest to the opposite shoulder and brought to the top of the patient's head and to the patient's ear. The shoulder appeared stable throughout this range of motion. The joint was dislocated and the trial components removed. The permanent size 3 Meta humeral stem was impacted into place with care taken to maintain the appropriate version and valgus alignment. The permanent 38 mm +0 mm insert was impacted into place. After verifying its locking mechanism, the shoulder was relocated using two finger pressure and again placed through a range of motion with the findings as described above.  The wound was copiously irrigated with sterile saline solution using the jet lavage system before a total of 30 cc of 0.5% Sensorcaine with epinephrine was injected into the pericapsular and peri-incisional tissues to help with postoperative analgesia. The subscapularis tendon was reapproximated using #2 FiberWire interrupted sutures. The deltopectoral interval was closed using #0 Vicryl interrupted sutures before the subcutaneous tissues were closed using 2-0 Vicryl interrupted sutures. The skin was closed using staples. A sterile occlusive dressing was applied to the  wound before the arm was placed into a shoulder immobilizer with an abduction pillow. A Polar Care system also was applied to the shoulder. The patient was then transferred back to a hospital bed before being awakened, extubated, and returned to the recovery room in satisfactory condition after tolerating the procedure well.

## 2023-03-04 NOTE — H&P (Signed)
History of Present Illness:  April Santos is a 67 y.o. female who presents today as a result of a call to our clinic for left shoulder pain and weakness.   The patient's symptoms began 5 to 6 months ago and developed without any clear-cut injury . She was seen by Cranston Neighbor, PA-C, and underwent a steroid injection. This injection provided moderate leaf of her symptoms but her symptoms recurred several months later so she underwent a repeat injection in August, 2024 which provided little if any relief of her symptoms. She also started him physical therapy with limited benefit. The patient subsequently underwent an MRI scan which showed a large rotator cuff tear. The patient was then referred to me to discuss further treatment options. The patient describes the symptoms as marked (major pain with significant limitations) and have the quality of being aching, miserable, nagging, stabbing, tender, and throbbing. The pain is localized to the lateral arm/shoulder. These symptoms are aggravated with normal daily activities, with sleeping, carrying heavy objects, at higher levels of activity, with overhead activity, reaching behind the back, and exercising. She has tried non-steroidal anti-inflammatories (meloxicam), narcotics , and Neurontin with very partial relief of her symptoms. She has tried physical therapy and rest with limited benefit. She has tried the one injection described above, also with limited benefit. The patient is right-hand dominant. She denies any neck pain, and denies any prior problems with her left shoulder. This complaint is not work related. She is a sports non-participant.  Shoulder Surgical History:  The patient has had no shoulder surgery in the past.  PMH/PSH/Family History/Social History/Meds/Allergies:  I have reviewed past medical, surgical, social and family history, medications and allergies as documented in the EMR.  Current Outpatient Medications:  ALPRAZolam (XANAX) 0.25  MG tablet Take 0.25 mg by mouth once daily as needed for Sleep.  BIOTIN ORAL Take 1 tablet by mouth once daily  CALCIUM CITRATE/VITAMIN D3 (CITRACAL + D ORAL) Take 2 tablets by mouth 2 (two) times daily.  desonide (DESOWEN) 0.05 % cream Apply topically 2 (two) times daily  diazePAM (VALIUM) 5 MG tablet Take 1-2 tablets by mouth 30 minutes prior to procedure. 2 tablet 0  estradiol (ESTRACE) 0.01 % (0.1 mg/gram) vaginal cream Place 2 g vaginally twice a week.  EUCRISA 2 % Oint Apply topically as needed  famotidine (PEPCID) 40 MG tablet Take 1 tablet by mouth once daily  gabapentin (NEURONTIN) 300 MG capsule Take 1 capsule (300 mg total) by mouth 2 (two) times daily 90 capsule 0  HYDROcodone-acetaminophen (NORCO) 5-325 mg tablet Take 1 tablet by mouth every 8 (eight) hours as needed 20 tablet 0  meloxicam (MOBIC) 15 MG tablet Take 1 tablet (15 mg total) by mouth once daily 30 tablet 0  multivitamin tablet Take 1 tablet by mouth once daily.  OTEZLA 30 mg tablet Take 1 tablet by mouth 2 (two) times daily  RABEprazole (ACIPHEX) 20 mg EC tablet   Allergies:   Erythromycin Nausea  Omnicef [Cefdinir] Hives   Past Medical History:  Diagnosis Date  Anemia  Breast lump  Benign  Chickenpox  Colon polyps  Disc disorder  Dysphagia  Environmental allergies  Fibroid tumor  GERD (gastroesophageal reflux disease)  Osteoarthritis  Psoriasis   Past Surgical History:  Removal of fibroid tumors 1997  foot surgery Right 2000  COLONOSCOPY 04/12/2005 (Condyloma Acuminatum)  FLEXIBLE SIGMOIDOSCOPY 06/05/2005  EGD 07/16/2005 (Normal)  EGD 06/10/2016 (Gastritis: No repeat per RTE)  COLONOSCOPY 06/10/2016 (Int Hemorrhoids, Diverticulosis: CBF 06/2026)  BREAST SURGERY  ENDOSCOPIC CARPAL TUNNEL RELEASE Right  Right thumb CMC arthroplasty on 11/16/20 (Dr. Rosita Kea)  sacroliliac joint injection 08-17-2013 and 04-13-2014  Status post hysterectomy  Tumor removed from foot Right (Benign)   Family History:   Breast cancer Mother  Myocardial Infarction (Heart attack) Father  Arthritis Father  Reflux disease Father  Colon cancer Neg Hx   Social History:   Socioeconomic History:  Marital status: Single  Tobacco Use  Smoking status: Never  Smokeless tobacco: Never  Vaping Use  Vaping status: Never Used  Substance and Sexual Activity  Alcohol use: Yes  Comment: socially  Drug use: No  Sexual activity: Not Currently   Social Drivers of Health:   Physicist, medical Strain: Low Risk (01/22/2023)  Received from [facility]  Overall Financial Resource Strain (CARDIA)  Difficulty of Paying Living Expenses: Not hard at all  Food Insecurity: No Food Insecurity (01/22/2023)  Received from [facility]  Hunger Vital Sign  Worried About Running Out of Food in the Last Year: Never true  Ran Out of Food in the Last Year: Never true  Transportation Needs: No Transportation Needs (01/22/2023)  Received from [facility]  Lack of Transportation (Medical): No  Lack of Transportation (Non-Medical): No   Review of Systems:  A comprehensive 14 point ROS was performed, reviewed, and the pertinent orthopaedic findings are documented in the HPI.  Physical Exam:  Vitals:  01/27/23 1025  BP: 122/82  Weight: 62.1 kg (137 lb)  Height: 152.4 cm (5')  PainSc: 3  PainLoc: Shoulder   General/Constitutional: The patient appears to be well-nourished, well-developed, and in no acute distress. Neuro/Psych: Normal mood and affect, oriented to person, place and time. Eyes: Non-icteric. Pupils are equal, round, and reactive to light, and exhibit synchronous movement. ENT: Unremarkable. Lymphatic: No palpable adenopathy. Respiratory: No wheezes and Non-labored breathing Cardiovascular: No edema, swelling or tenderness, except as noted in detailed exam. Integumentary: No impressive skin lesions present, except as noted in detailed exam. Musculoskeletal: Unremarkable, except as  noted in detailed exam.  Left shoulder exam: SKIN: normal SWELLING: none WARMTH: none LYMPH NODES: no adenopathy palpable CREPITUS: none TENDERNESS: Mildly tender over anterolateral shoulder region ROM (active):  Forward flexion: 90 degrees Abduction: Degrees Internal rotation: L1 ROM (passive):  Forward flexion: 155 degrees Abduction: 145 degrees ER/IR at 90 abd: 85 degrees / 55 degrees  She has moderate to severe pain with attempted active forward flexion and abduction, and mild pain at the extremes of all other motions.  STRENGTH: Forward flexion: 3/5 Abduction: 3/5 External rotation: 3/5 Internal rotation: 4-4+/5 Pain with RC testing: Moderate-bear pain with attempted forward flexion or abduction, and moderate pain with resisted external rotation.  STABILITY: Normal  SPECIAL TESTS: Juanetta Gosling' test: positive, moderate Speed's test: positive Capsulitis - pain w/ passive ER: no Crossed arm test: Mildly positive Crank: Not evaluated Anterior apprehension: Negative Posterior apprehension: Not evaluated  She is neurovascularly intact to the left upper extremity.  Imaging:  Left Shoulder Imaging, MRI: MRI Shoulder Cartilage: Partial thickness humeral head cartilage loss. Partial thickness glenoid cartilage loss. MRI Shoulder Rotator Cuff: Full thickness tear of the spinatus and infraspinatus tendons. Retracted to the glenohumeral joint. "Mild fatty atrophy and swelling supraspinatus and infraspinatus muscles." MRI Shoulder Labrum / Biceps: Biceps tendinopathy. Also Type II SLAP tear. MRI Shoulder Bone: Normal bone.  Both the films and report were reviewed by myself and discussed with the patient.  Assessment:  1. Nontraumatic complete tear of left rotator  cuff.  2. Rotator cuff tendinitis, left.   Plan:  The treatment options were discussed with the patient. In addition, patient educational materials were provided regarding the diagnosis and treatment options. The  patient is quite frustrated by her symptoms and functional limitations, and is ready to consider more aggressive treatment options. Therefore, I have recommended a surgical procedure, specifically a reverse left total shoulder arthroplasty. The procedure was discussed with the patient, as were the potential risks (including bleeding, infection, nerve and/or blood vessel injury, persistent or recurrent pain, loosening and/or failure of the components, dislocation, need for further surgery, blood clots, strokes, heart attacks and/or arhythmias, pneumonia, etc.) and benefits. The patient states her understanding and wishes to proceed. All of the patient's questions and concerns were answered. She can call any time with further concerns. She will follow up post-surgery, routine.    H&P reviewed and patient re-examined. No changes.

## 2023-03-04 NOTE — Anesthesia Procedure Notes (Signed)
Procedure Name: Intubation Date/Time: 03/04/2023 10:34 AM  Performed by: Morene Crocker, CRNAPre-anesthesia Checklist: Patient identified, Patient being monitored, Timeout performed, Emergency Drugs available and Suction available Patient Re-evaluated:Patient Re-evaluated prior to induction Oxygen Delivery Method: Circle system utilized Preoxygenation: Pre-oxygenation with 100% oxygen Induction Type: IV induction Ventilation: Mask ventilation without difficulty Laryngoscope Size: 3 and McGrath Grade View: Grade I Tube type: Oral Tube size: 6.5 mm Number of attempts: 1 Airway Equipment and Method: Stylet Placement Confirmation: ETT inserted through vocal cords under direct vision, positive ETCO2 and breath sounds checked- equal and bilateral Secured at: 21 cm Tube secured with: Tape Dental Injury: Teeth and Oropharynx as per pre-operative assessment  Comments: Smooth atraumatic intubation, no complications noted

## 2023-03-04 NOTE — Anesthesia Postprocedure Evaluation (Signed)
Anesthesia Post Note  Patient: April Santos  Procedure(s) Performed: REVERSE SHOULDER ARTHROPLASTY (Left: Shoulder)  Patient location during evaluation: PACU Anesthesia Type: General Level of consciousness: awake and alert Pain management: pain level controlled Vital Signs Assessment: post-procedure vital signs reviewed and stable Respiratory status: spontaneous breathing, nonlabored ventilation, respiratory function stable and patient connected to nasal cannula oxygen Cardiovascular status: blood pressure returned to baseline and stable Postop Assessment: no apparent nausea or vomiting Anesthetic complications: no   No notable events documented.   Last Vitals:  Vitals:   03/04/23 1330 03/04/23 1400  BP: 133/80 107/74  Pulse: 69 64  Resp: 14 15  Temp:  (!) 36.4 C  SpO2: 100% 98%    Last Pain:  Vitals:   03/04/23 1400  TempSrc:   PainSc: 0-No pain                 Louie Boston

## 2023-03-04 NOTE — Transfer of Care (Signed)
Immediate Anesthesia Transfer of Care Note  Patient: April Santos  Procedure(s) Performed: REVERSE SHOULDER ARTHROPLASTY (Left: Shoulder)  Patient Location: PACU  Anesthesia Type:General  Level of Consciousness: drowsy  Airway & Oxygen Therapy: Patient Spontanous Breathing and Patient connected to face mask oxygen  Post-op Assessment: Report given to RN and Post -op Vital signs reviewed and stable  Post vital signs: Reviewed and stable  Last Vitals:  Vitals Value Taken Time  BP 107/64 03/04/23 1301  Temp 35.9 1301  Pulse 57 03/04/23 1304  Resp 13 03/04/23 1304  SpO2 99 % 03/04/23 1304  Vitals shown include unfiled device data.  Last Pain:  Vitals:   03/04/23 0847  TempSrc: Temporal  PainSc: 8       Patients Stated Pain Goal: 0 (03/04/23 0847)  Complications: No notable events documented.

## 2023-03-05 ENCOUNTER — Encounter: Payer: Self-pay | Admitting: Surgery

## 2023-03-06 DIAGNOSIS — K5900 Constipation, unspecified: Secondary | ICD-10-CM | POA: Diagnosis not present

## 2023-03-06 DIAGNOSIS — F419 Anxiety disorder, unspecified: Secondary | ICD-10-CM | POA: Diagnosis not present

## 2023-03-06 DIAGNOSIS — Z471 Aftercare following joint replacement surgery: Secondary | ICD-10-CM | POA: Diagnosis not present

## 2023-03-06 DIAGNOSIS — R1310 Dysphagia, unspecified: Secondary | ICD-10-CM | POA: Diagnosis not present

## 2023-03-06 DIAGNOSIS — D649 Anemia, unspecified: Secondary | ICD-10-CM | POA: Diagnosis not present

## 2023-03-06 DIAGNOSIS — L409 Psoriasis, unspecified: Secondary | ICD-10-CM | POA: Diagnosis not present

## 2023-03-06 DIAGNOSIS — M519 Unspecified thoracic, thoracolumbar and lumbosacral intervertebral disc disorder: Secondary | ICD-10-CM | POA: Diagnosis not present

## 2023-03-06 DIAGNOSIS — K219 Gastro-esophageal reflux disease without esophagitis: Secondary | ICD-10-CM | POA: Diagnosis not present

## 2023-03-06 DIAGNOSIS — M1990 Unspecified osteoarthritis, unspecified site: Secondary | ICD-10-CM | POA: Diagnosis not present

## 2023-03-10 DIAGNOSIS — R1310 Dysphagia, unspecified: Secondary | ICD-10-CM | POA: Diagnosis not present

## 2023-03-10 DIAGNOSIS — M519 Unspecified thoracic, thoracolumbar and lumbosacral intervertebral disc disorder: Secondary | ICD-10-CM | POA: Diagnosis not present

## 2023-03-10 DIAGNOSIS — K219 Gastro-esophageal reflux disease without esophagitis: Secondary | ICD-10-CM | POA: Diagnosis not present

## 2023-03-10 DIAGNOSIS — K5900 Constipation, unspecified: Secondary | ICD-10-CM | POA: Diagnosis not present

## 2023-03-10 DIAGNOSIS — L409 Psoriasis, unspecified: Secondary | ICD-10-CM | POA: Diagnosis not present

## 2023-03-10 DIAGNOSIS — Z471 Aftercare following joint replacement surgery: Secondary | ICD-10-CM | POA: Diagnosis not present

## 2023-03-10 DIAGNOSIS — M1990 Unspecified osteoarthritis, unspecified site: Secondary | ICD-10-CM | POA: Diagnosis not present

## 2023-03-10 DIAGNOSIS — D649 Anemia, unspecified: Secondary | ICD-10-CM | POA: Diagnosis not present

## 2023-03-10 DIAGNOSIS — F419 Anxiety disorder, unspecified: Secondary | ICD-10-CM | POA: Diagnosis not present

## 2023-03-13 DIAGNOSIS — L409 Psoriasis, unspecified: Secondary | ICD-10-CM | POA: Diagnosis not present

## 2023-03-13 DIAGNOSIS — F419 Anxiety disorder, unspecified: Secondary | ICD-10-CM | POA: Diagnosis not present

## 2023-03-13 DIAGNOSIS — D649 Anemia, unspecified: Secondary | ICD-10-CM | POA: Diagnosis not present

## 2023-03-13 DIAGNOSIS — Z471 Aftercare following joint replacement surgery: Secondary | ICD-10-CM | POA: Diagnosis not present

## 2023-03-13 DIAGNOSIS — K5900 Constipation, unspecified: Secondary | ICD-10-CM | POA: Diagnosis not present

## 2023-03-13 DIAGNOSIS — M1990 Unspecified osteoarthritis, unspecified site: Secondary | ICD-10-CM | POA: Diagnosis not present

## 2023-03-13 DIAGNOSIS — K219 Gastro-esophageal reflux disease without esophagitis: Secondary | ICD-10-CM | POA: Diagnosis not present

## 2023-03-13 DIAGNOSIS — R1310 Dysphagia, unspecified: Secondary | ICD-10-CM | POA: Diagnosis not present

## 2023-03-13 DIAGNOSIS — M519 Unspecified thoracic, thoracolumbar and lumbosacral intervertebral disc disorder: Secondary | ICD-10-CM | POA: Diagnosis not present

## 2023-03-17 ENCOUNTER — Other Ambulatory Visit: Payer: Self-pay

## 2023-03-17 DIAGNOSIS — L409 Psoriasis, unspecified: Secondary | ICD-10-CM | POA: Diagnosis not present

## 2023-03-17 DIAGNOSIS — K219 Gastro-esophageal reflux disease without esophagitis: Secondary | ICD-10-CM | POA: Diagnosis not present

## 2023-03-17 DIAGNOSIS — D649 Anemia, unspecified: Secondary | ICD-10-CM | POA: Diagnosis not present

## 2023-03-17 DIAGNOSIS — F419 Anxiety disorder, unspecified: Secondary | ICD-10-CM | POA: Diagnosis not present

## 2023-03-17 DIAGNOSIS — M1990 Unspecified osteoarthritis, unspecified site: Secondary | ICD-10-CM | POA: Diagnosis not present

## 2023-03-17 DIAGNOSIS — M519 Unspecified thoracic, thoracolumbar and lumbosacral intervertebral disc disorder: Secondary | ICD-10-CM | POA: Diagnosis not present

## 2023-03-17 DIAGNOSIS — Z471 Aftercare following joint replacement surgery: Secondary | ICD-10-CM | POA: Diagnosis not present

## 2023-03-17 DIAGNOSIS — R1310 Dysphagia, unspecified: Secondary | ICD-10-CM | POA: Diagnosis not present

## 2023-03-17 DIAGNOSIS — K5900 Constipation, unspecified: Secondary | ICD-10-CM | POA: Diagnosis not present

## 2023-03-17 MED ORDER — OTEZLA 30 MG PO TABS: 30.0000 mg | ORAL_TABLET | Freq: Every day | ORAL | 5 refills | Status: AC

## 2023-03-17 NOTE — Progress Notes (Signed)
Pt called requesting refills of Otezla 30 mg po QD.

## 2023-03-18 ENCOUNTER — Ambulatory Visit: Payer: Medicare PPO | Admitting: Internal Medicine

## 2023-03-18 VITALS — BP 118/70 | HR 79 | Temp 98.0°F | Resp 16 | Ht 61.0 in | Wt 130.0 lb

## 2023-03-18 DIAGNOSIS — E559 Vitamin D deficiency, unspecified: Secondary | ICD-10-CM | POA: Diagnosis not present

## 2023-03-18 DIAGNOSIS — R87622 Low grade squamous intraepithelial lesion on cytologic smear of vagina (LGSIL): Secondary | ICD-10-CM

## 2023-03-18 DIAGNOSIS — F439 Reaction to severe stress, unspecified: Secondary | ICD-10-CM

## 2023-03-18 DIAGNOSIS — M75102 Unspecified rotator cuff tear or rupture of left shoulder, not specified as traumatic: Secondary | ICD-10-CM | POA: Diagnosis not present

## 2023-03-18 DIAGNOSIS — R739 Hyperglycemia, unspecified: Secondary | ICD-10-CM

## 2023-03-18 DIAGNOSIS — K219 Gastro-esophageal reflux disease without esophagitis: Secondary | ICD-10-CM

## 2023-03-18 DIAGNOSIS — L4050 Arthropathic psoriasis, unspecified: Secondary | ICD-10-CM | POA: Diagnosis not present

## 2023-03-18 DIAGNOSIS — Z13220 Encounter for screening for lipoid disorders: Secondary | ICD-10-CM | POA: Diagnosis not present

## 2023-03-18 NOTE — Progress Notes (Signed)
Subjective:    Patient ID: April Santos, female    DOB: Dec 15, 1955, 67 y.o.   MRN: [id number]  Patient here for  Chief Complaint  Patient presents with   Medical Management of Chronic Issues    HPI Here for a scheduled follow up. Is s/p reverse left total shoulder arthroplasty for a massive rotator cuff tear - 03/04/23. Followed by ortho. Taking gabapentin. Recently evaluated by GI 01/29/23 - diarrhea resolved. Due colonoscopy 2028. Does have issues with dysphagia - to solid foods. Plan for EGD once recovered from shoulder surgery. Remains on aciphex - which controls acid reflux. Taking miralax - to help keep bowels moving. PT - tomorrow.    Past Medical History:  Diagnosis Date   Arthritis    Colon polyps    COVID-19 2022   Disc disorder    Dysphagia    Family history of adverse reaction to anesthesia    mother nausea   GERD (gastroesophageal reflux disease)    History of uterine fibroid    Psoriasis    Past Surgical History:  Procedure Laterality Date   ABDOMINAL HYSTERECTOMY     BREAST SURGERY  1999   biopsy   carpal  2005   Tunnel right hand   CARPOMETACARPAL (CMC) FUSION OF THUMB Right 11/16/2020   Procedure: Right CARPOMETACARPAL [facility]) ARTHROPLASTY;  Surgeon: Kennedy Bucker, MD;  Location: [facility] ORS;  Service: Orthopedics;  Laterality: Right;   COLONOSCOPY WITH ESOPHAGOGASTRODUODENOSCOPY (EGD)     COLONOSCOPY WITH PROPOFOL N/A 06/10/2016   Procedure: COLONOSCOPY WITH PROPOFOL;  Surgeon: Scot Jun, MD;  Location: [facility] ENDOSCOPY;  Service: Endoscopy;  Laterality: N/A;   ESOPHAGOGASTRODUODENOSCOPY (EGD) WITH PROPOFOL N/A 06/10/2016   Procedure: ESOPHAGOGASTRODUODENOSCOPY (EGD) WITH PROPOFOL;  Surgeon: Scot Jun, MD;  Location: [facility] ENDOSCOPY;  Service: Endoscopy;  Laterality: N/A;   FOOT SURGERY  02/07/1003   right foot benign tumor   REVERSE SHOULDER ARTHROPLASTY Left 03/04/2023   Procedure: REVERSE SHOULDER ARTHROPLASTY;  Surgeon: Christena Flake, MD;   Location: [facility] ORS;  Service: Orthopedics;  Laterality: Left;   SACROILIAC JOINT INJECTION  08/17/13 & 04/13/14   Dr. Yves Dill   SIGMOIDOSCOPY     UTERINE FIBROID SURGERY  1997   Family History  Problem Relation Age of Onset   Breast cancer Mother        38/74   Heart disease Father    Arthritis Father    GER disease Father    Diabetes Brother    Diabetes Maternal Uncle    Colon cancer Neg Hx    Social History   Socioeconomic History   Marital status: Significant Other    Spouse name: [name]   Number of children: 0   Years of education: Not on file   Highest education level: Not on file  Occupational History   Not on file  Tobacco Use   Smoking status: Never   Smokeless tobacco: Never  Vaping Use   Vaping status: Never Used  Substance and Sexual Activity   Alcohol use: Yes    Alcohol/week: 1.0 standard drink of alcohol    Types: 1 Glasses of wine per week    Comment: rare   Drug use: No   Sexual activity: Yes    Birth control/protection: Surgical    Comment: Hysterectomy  Other Topics Concern   Not on file  Social History Narrative   [name] - POA   Social Drivers of Health   Financial Resource Strain: Low Risk  (01/22/2023)  Overall Financial Resource Strain (CARDIA)    Difficulty of Paying Living Expenses: Not hard at all  Food Insecurity: No Food Insecurity (01/22/2023)   Hunger Vital Sign    Worried About Running Out of Food in the Last Year: Never true    Ran Out of Food in the Last Year: Never true  Transportation Needs: No Transportation Needs (01/22/2023)   PRAPARE - Administrator, Civil Service (Medical): No    Lack of Transportation (Non-Medical): No  Physical Activity: Sufficiently Active (01/22/2023)   Exercise Vital Sign    Days of Exercise per Week: 5 days    Minutes of Exercise per Session: 40 min  Stress: Stress Concern Present (01/22/2023)   [employer] of Occupational Health - Occupational Stress  Questionnaire    Feeling of Stress : To some extent  Social Connections: Moderately Integrated (01/22/2023)   Social Connection and Isolation Panel [NHANES]    Frequency of Communication with Friends and Family: More than three times a week    Frequency of Social Gatherings with Friends and Family: More than three times a week    Attends Religious Services: More than 4 times per year    Active Member of [employer] or Organizations: No    Attends Banker Meetings: Never    Marital Status: Living with partner     Review of Systems  Constitutional:  Negative for appetite change and unexpected weight change.  HENT:  Negative for congestion and sinus pressure.   Respiratory:  Negative for cough, chest tightness and shortness of breath.   Cardiovascular:  Negative for chest pain and palpitations.  Gastrointestinal:  Negative for abdominal pain, diarrhea, nausea and vomiting.  Genitourinary:  Negative for difficulty urinating and dysuria.  Musculoskeletal:  Negative for myalgias.       Persistent shoulder issues as outlined.   Skin:  Negative for color change and rash.  Neurological:  Negative for dizziness and headaches.  Psychiatric/Behavioral:  Negative for agitation and dysphoric mood.        Objective:     BP 118/70   Pulse 79   Temp 98 F (36.7 C)   Resp 16   Ht 5\' 1"  (1.549 m)   Wt 130 lb (59 kg)   LMP 02/26/2002   SpO2 98%   BMI 24.56 kg/m  Wt Readings from Last 3 Encounters:  03/18/23 130 lb (59 kg)  03/04/23 134 lb 7.7 oz (61 kg)  02/25/23 134 lb 7.7 oz (61 kg)    Physical Exam Vitals reviewed.  Constitutional:      General: She is not in acute distress.    Appearance: Normal appearance.  HENT:     Head: Normocephalic and atraumatic.     Right Ear: External ear normal.     Left Ear: External ear normal.     Mouth/Throat:     Pharynx: No oropharyngeal exudate or posterior oropharyngeal erythema.  Eyes:     General: No scleral icterus.       Right  eye: No discharge.        Left eye: No discharge.     Conjunctiva/sclera: Conjunctivae normal.  Neck:     Thyroid: No thyromegaly.  Cardiovascular:     Rate and Rhythm: Normal rate and regular rhythm.  Pulmonary:     Effort: No respiratory distress.     Breath sounds: Normal breath sounds. No wheezing.  Abdominal:     General: Bowel sounds are normal.     Palpations:  Abdomen is soft.     Tenderness: There is no abdominal tenderness.  Musculoskeletal:        General: No swelling or tenderness.     Cervical back: Neck supple. No tenderness.  Lymphadenopathy:     Cervical: No cervical adenopathy.  Skin:    Findings: No erythema or rash.  Neurological:     Mental Status: She is alert.  Psychiatric:        Mood and Affect: Mood normal.        Behavior: Behavior normal.      Outpatient Encounter Medications as of 03/18/2023  Medication Sig   ALPRAZolam (XANAX) 0.25 MG tablet Take 1 tablet (0.25 mg total) by mouth daily as needed.   Apremilast (OTEZLA) 30 MG TABS Take 1 tablet (30 mg total) by mouth daily.   B Complex-C (B-COMPLEX WITH VITAMIN C) tablet Take 1 tablet by mouth daily.   Biotin-Vitamin C (HAIR SKIN NAILS GUMMIES PO) Take 2 tablets by mouth daily. Olly   cholecalciferol (VITAMIN D3) 25 MCG (1000 UNIT) tablet Take 1,000 Units by mouth daily.   Crisaborole (EUCRISA) 2 % OINT Apply 1 application topically 2 (two) times daily as needed (psoriasis).   Cyanocobalamin (VITAMIN B-12) 3000 MCG SUBL Place 6,000 mcg under the tongue daily at 2 am. Gummy   desonide (DESOWEN) 0.05 % cream Apply 1 application topically 2 (two) times daily as needed (psoriasis).   estradiol (ESTRACE VAGINAL) 0.1 MG/GM vaginal cream Insert 1 gram vaginally 1-2 times weekly as maintenance   gabapentin (NEURONTIN) 300 MG capsule Take 300 mg by mouth as directed. Two caps in the AM and PM. 1 capsule at lunch.   ibuprofen (ADVIL) 200 MG tablet Take 600 mg by mouth every 6 (six) hours as needed for  headache.   Multiple Vitamins-Minerals (MULTIVITAMIN WITH MINERALS) tablet Take 1 tablet by mouth daily. Olly   mupirocin ointment (BACTROBAN) 2 % Apply 1 Application topically daily as needed (Wound).   OVER THE COUNTER MEDICATION Take 2 tablets by mouth daily.  Olly Heavenly Hair  gummy   oxyCODONE (ROXICODONE) 5 MG immediate release tablet Take 1-2 tablets (5-10 mg total) by mouth every 4 (four) hours as needed for moderate pain (pain score 4-6) or severe pain (pain score 7-10).   RABEprazole (ACIPHEX) 20 MG tablet Take 1 tablet (20 mg total) by mouth 2 (two) times daily.   silver sulfADIAZINE (SILVADENE) 1 % cream Apply 1 Application topically daily.   triamcinolone cream (KENALOG) 0.1 % Apply 1 Application topically 2 (two) times daily as needed. For up to 2 weeks. Avoid applying to face, groin, and axilla. Use as directed. Long-term use can cause thinning of the skin.   Wheat Dextrin (BENEFIBER) CHEW Chew 2 tablets by mouth every morning. Gummy   No facility-administered encounter medications on file as of 03/18/2023.     Lab Results  Component Value Date   WBC 5.3 02/25/2023   HGB 13.6 02/25/2023   HCT 40.4 02/25/2023   PLT 297 02/25/2023   GLUCOSE 97 02/25/2023   CHOL 183 11/12/2022   TRIG 103.0 11/12/2022   HDL 62.90 11/12/2022   LDLCALC 99 11/12/2022   ALT 19 02/25/2023   AST 21 02/25/2023   NA 140 02/25/2023   K 3.8 02/25/2023   CL 103 02/25/2023   CREATININE 0.76 02/25/2023   BUN 14 02/25/2023   CO2 29 02/25/2023   TSH 1.00 03/07/2022   HGBA1C 5.8 11/12/2022    DG Shoulder Left Port  Result  Date: 03/04/2023 CLINICAL DATA:  Status post reverse shoulder arthroplasty, left. EXAM: LEFT SHOULDER COMPARISON:  None Available. FINDINGS: Reverse left shoulder arthroplasty in expected alignment. No periprosthetic lucency or fracture. Recent postsurgical change includes air and edema in the joint space and soft tissues. Overlying skin staples in place. IMPRESSION: Reverse left  shoulder arthroplasty without immediate postoperative complication. Electronically Signed   By: Narda Rutherford M.D.   On: 03/04/2023 17:30   [place] OR NERVE BLOCK-IMAGE ONLY [facility])  Result Date: 03/04/2023 There is no interpretation for this exam.  This order is for images obtained during a surgical procedure.  Please See "Surgeries" Tab for more information regarding the procedure.       Assessment & Plan:  Hyperglycemia Assessment & Plan: The 10-year ASCVD risk score (Arnett DK, et al., 2019) is: 7.1%   Values used to calculate the score:     Age: 54 years     Sex: Female     Is Non-Hispanic African American: No     Diabetic: No     Tobacco smoker: No     Systolic Blood Pressure: 128 mmHg     Is BP treated: No     HDL Cholesterol: 62.9 mg/dL     Total Cholesterol: 183 mg/dL  Low carb diet and exercise.  Follow met b and A1c.   Orders: -     Hemoglobin A1c; Future  Psoriatic arthritis [facility]) Assessment & Plan: Followed by dermatology.  On otezla.    Orders: -     Basic metabolic panel; Future -     Hepatic function panel; Future -     TSH; Future  Vitamin D deficiency Assessment & Plan: Continue vitamin D supplements.   Orders: -     VITAMIN D 25 Hydroxy (Vit-D Deficiency, Fractures); Future  Screening cholesterol level -     Lipid panel; Future  Stress Assessment & Plan: Overall appears to be handling things relatively well.   Tear of left rotator cuff, unspecified tear extent, unspecified whether traumatic Assessment & Plan: Is s/p reverse left total shoulder arthroplasty for a massive rotator cuff tear - 03/04/23. Followed by ortho.    LGSIL Pap smear of vagina Assessment & Plan: Saw gyn 04/23/22 - PAP - ok.  Recommended repeat next year.     Gastroesophageal reflux disease without esophagitis Assessment & Plan: Recently evaluated by GI 01/29/23 - diarrhea resolved. Due colonoscopy 2028. Does have issues with dysphagia - to solid foods. Plan for EGD  once recovered from shoulder surgery. Remains on aciphex - which controls acid reflux.       Dale [place], MD

## 2023-03-19 DIAGNOSIS — M25512 Pain in left shoulder: Secondary | ICD-10-CM | POA: Diagnosis not present

## 2023-03-19 DIAGNOSIS — M25412 Effusion, left shoulder: Secondary | ICD-10-CM | POA: Diagnosis not present

## 2023-03-23 ENCOUNTER — Encounter: Payer: Self-pay | Admitting: Internal Medicine

## 2023-03-23 NOTE — Assessment & Plan Note (Signed)
Followed by dermatology.  On otezla.

## 2023-03-23 NOTE — Assessment & Plan Note (Signed)
Is s/p reverse left total shoulder arthroplasty for a massive rotator cuff tear - 03/04/23. Followed by ortho.

## 2023-03-23 NOTE — Assessment & Plan Note (Signed)
Recently evaluated by GI 01/29/23 - diarrhea resolved. Due colonoscopy 2028. Does have issues with dysphagia - to solid foods. Plan for EGD once recovered from shoulder surgery. Remains on aciphex - which controls acid reflux.

## 2023-03-23 NOTE — Assessment & Plan Note (Signed)
The 10-year ASCVD risk score (Arnett DK, et al., 2019) is: 7.1%   Values used to calculate the score:     Age: 67 years     Sex: Female     Is Non-Hispanic African American: No     Diabetic: No     Tobacco smoker: No     Systolic Blood Pressure: 128 mmHg     Is BP treated: No     HDL Cholesterol: 62.9 mg/dL     Total Cholesterol: 183 mg/dL  Low carb diet and exercise.  Follow met b and A1c.

## 2023-03-23 NOTE — Assessment & Plan Note (Signed)
Saw gyn 04/23/22 - PAP - ok.  Recommended repeat next year.

## 2023-03-23 NOTE — Assessment & Plan Note (Signed)
Overall appears to be handling things relatively well. 

## 2023-03-23 NOTE — Assessment & Plan Note (Signed)
Continue vitamin D supplements.  

## 2023-03-24 ENCOUNTER — Encounter: Payer: Self-pay | Admitting: Internal Medicine

## 2023-03-24 ENCOUNTER — Other Ambulatory Visit: Payer: Self-pay

## 2023-03-24 DIAGNOSIS — E559 Vitamin D deficiency, unspecified: Secondary | ICD-10-CM

## 2023-03-24 NOTE — Telephone Encounter (Signed)
Have her come in for a vitamin D level so that we can see the level on the current dose she is taking. Can then inform her if needs more or needs to adjust dose.

## 2023-03-24 NOTE — Progress Notes (Signed)
ERROR

## 2023-03-25 DIAGNOSIS — M5416 Radiculopathy, lumbar region: Secondary | ICD-10-CM | POA: Diagnosis not present

## 2023-03-25 DIAGNOSIS — M48062 Spinal stenosis, lumbar region with neurogenic claudication: Secondary | ICD-10-CM | POA: Diagnosis not present

## 2023-03-27 DIAGNOSIS — M25412 Effusion, left shoulder: Secondary | ICD-10-CM | POA: Diagnosis not present

## 2023-03-27 DIAGNOSIS — M25512 Pain in left shoulder: Secondary | ICD-10-CM | POA: Diagnosis not present

## 2023-04-03 DIAGNOSIS — M25412 Effusion, left shoulder: Secondary | ICD-10-CM | POA: Diagnosis not present

## 2023-04-03 DIAGNOSIS — M25512 Pain in left shoulder: Secondary | ICD-10-CM | POA: Diagnosis not present

## 2023-04-10 DIAGNOSIS — M25512 Pain in left shoulder: Secondary | ICD-10-CM | POA: Diagnosis not present

## 2023-04-10 DIAGNOSIS — M25412 Effusion, left shoulder: Secondary | ICD-10-CM | POA: Diagnosis not present

## 2023-04-14 ENCOUNTER — Encounter: Payer: Self-pay | Admitting: Dermatology

## 2023-04-14 ENCOUNTER — Ambulatory Visit: Payer: Medicare PPO | Admitting: Dermatology

## 2023-04-14 VITALS — Wt 130.0 lb

## 2023-04-14 DIAGNOSIS — L409 Psoriasis, unspecified: Secondary | ICD-10-CM

## 2023-04-14 DIAGNOSIS — Z79899 Other long term (current) drug therapy: Secondary | ICD-10-CM | POA: Diagnosis not present

## 2023-04-14 DIAGNOSIS — Z7189 Other specified counseling: Secondary | ICD-10-CM

## 2023-04-14 NOTE — Progress Notes (Signed)
   Follow-Up Visit   Subjective  April Santos is a 68 y.o. female who presents for the following: Psoriasis  At knees and elbows. Clear per patient. She is taking Otezla  once daily due to diarrhea and uses TMC 0.1% cream and Vtama  when needed.   The following portions of the chart were reviewed this encounter and updated as appropriate: medications, allergies, medical history  Review of Systems:  No other skin or systemic complaints except as noted in HPI or Assessment and Plan.  Objective  Well appearing patient in no apparent distress; mood and affect are within normal limits.  Areas Examined: elbows  Relevant exam findings are noted in the Assessment and Plan.      Assessment & Plan     PSORIASIS Clear on exam today. 0% BSA. On Otezla   Chronic condition with duration or expected duration over one year. Currently well-controlled.  No headache, depression, or upset stomach while on Otezla . Patient has lost 16 lbs since having shoulder surgery but she feels like it is related to loss of appetite from surgery.  Treatment Plan: Continue Otezla  30 mg every day Continue Vtama  daily alternating with TMC 0.1% cr as needed   Counseling on psoriasis and coordination of care  psoriasis is a chronic non-curable, but treatable genetic/hereditary disease that may have other systemic features affecting other organ systems such as joints (Psoriatic Arthritis). It is associated with an increased risk of inflammatory bowel disease, heart disease, non-alcoholic fatty liver disease, and depression.  Treatments include light and laser treatments; topical medications; and systemic medications including oral and injectables.  Side effects of Otezla  (apremilast ) include diarrhea, nausea, headache, upper respiratory infection, depression, and weight decrease (5-10%). It should only be taken by pregnant women after a discussion regarding risks and benefits with their doctor. Goal is control of  skin condition, not cure.  The use of Otezla  requires long term medication management, including periodic office visits.   Return in about 6 months (around 10/12/2023) for Psoriasis.  LILLETTE Lonell Drones, RMA, am acting as scribe for Alm Rhyme, MD .   Documentation: I have reviewed the above documentation for accuracy and completeness, and I agree with the above.  Alm Rhyme, MD

## 2023-04-14 NOTE — Patient Instructions (Signed)

## 2023-04-16 ENCOUNTER — Ambulatory Visit: Payer: Medicare PPO | Admitting: Dermatology

## 2023-04-16 DIAGNOSIS — M5136 Other intervertebral disc degeneration, lumbar region with discogenic back pain only: Secondary | ICD-10-CM | POA: Diagnosis not present

## 2023-04-16 DIAGNOSIS — M5416 Radiculopathy, lumbar region: Secondary | ICD-10-CM | POA: Diagnosis not present

## 2023-04-16 DIAGNOSIS — M51362 Other intervertebral disc degeneration, lumbar region with discogenic back pain and lower extremity pain: Secondary | ICD-10-CM | POA: Diagnosis not present

## 2023-04-16 DIAGNOSIS — M47816 Spondylosis without myelopathy or radiculopathy, lumbar region: Secondary | ICD-10-CM | POA: Diagnosis not present

## 2023-04-17 DIAGNOSIS — M25412 Effusion, left shoulder: Secondary | ICD-10-CM | POA: Diagnosis not present

## 2023-04-17 DIAGNOSIS — M25512 Pain in left shoulder: Secondary | ICD-10-CM | POA: Diagnosis not present

## 2023-04-18 DIAGNOSIS — Z96612 Presence of left artificial shoulder joint: Secondary | ICD-10-CM | POA: Diagnosis not present

## 2023-04-22 DIAGNOSIS — M25512 Pain in left shoulder: Secondary | ICD-10-CM | POA: Diagnosis not present

## 2023-04-22 DIAGNOSIS — M25412 Effusion, left shoulder: Secondary | ICD-10-CM | POA: Diagnosis not present

## 2023-04-25 DIAGNOSIS — M25412 Effusion, left shoulder: Secondary | ICD-10-CM | POA: Diagnosis not present

## 2023-04-25 DIAGNOSIS — M25512 Pain in left shoulder: Secondary | ICD-10-CM | POA: Diagnosis not present

## 2023-04-29 DIAGNOSIS — M25412 Effusion, left shoulder: Secondary | ICD-10-CM | POA: Diagnosis not present

## 2023-04-29 DIAGNOSIS — M25512 Pain in left shoulder: Secondary | ICD-10-CM | POA: Diagnosis not present

## 2023-04-30 ENCOUNTER — Encounter: Payer: Self-pay | Admitting: Internal Medicine

## 2023-05-01 ENCOUNTER — Other Ambulatory Visit: Payer: Medicare PPO

## 2023-05-01 ENCOUNTER — Other Ambulatory Visit (INDEPENDENT_AMBULATORY_CARE_PROVIDER_SITE_OTHER): Payer: Medicare PPO

## 2023-05-01 DIAGNOSIS — E559 Vitamin D deficiency, unspecified: Secondary | ICD-10-CM | POA: Diagnosis not present

## 2023-05-01 DIAGNOSIS — R739 Hyperglycemia, unspecified: Secondary | ICD-10-CM

## 2023-05-01 DIAGNOSIS — M25412 Effusion, left shoulder: Secondary | ICD-10-CM | POA: Diagnosis not present

## 2023-05-01 DIAGNOSIS — L4050 Arthropathic psoriasis, unspecified: Secondary | ICD-10-CM | POA: Diagnosis not present

## 2023-05-01 DIAGNOSIS — Z13220 Encounter for screening for lipoid disorders: Secondary | ICD-10-CM

## 2023-05-01 DIAGNOSIS — M25512 Pain in left shoulder: Secondary | ICD-10-CM | POA: Diagnosis not present

## 2023-05-01 LAB — BASIC METABOLIC PANEL
BUN: 13 mg/dL (ref 6–23)
CO2: 29 meq/L (ref 19–32)
Calcium: 9.5 mg/dL (ref 8.4–10.5)
Chloride: 106 meq/L (ref 96–112)
Creatinine, Ser: 0.85 mg/dL (ref 0.40–1.20)
GFR: 70.16 mL/min (ref >60.00)
Glucose, Bld: 97 mg/dL (ref 70–99)
Potassium: 4.5 meq/L (ref 3.5–5.1)
Sodium: 143 meq/L (ref 135–145)

## 2023-05-01 LAB — HEPATIC FUNCTION PANEL
ALT: 15 U/L (ref 0–35)
AST: 17 U/L (ref 0–37)
Albumin: 4.2 g/dL (ref 3.5–5.2)
Alkaline Phosphatase: 80 U/L (ref 39–117)
Bilirubin, Direct: 0.1 mg/dL (ref 0.0–0.3)
Total Bilirubin: 0.4 mg/dL (ref 0.2–1.2)
Total Protein: 6.9 g/dL (ref 6.0–8.3)

## 2023-05-01 LAB — LIPID PANEL
Cholesterol: 178 mg/dL (ref 0–200)
HDL: 68.9 mg/dL (ref >39.00)
LDL Cholesterol: 96 mg/dL (ref 0–99)
NonHDL: 109.19
Total CHOL/HDL Ratio: 3
Triglycerides: 67 mg/dL (ref 0.0–149.0)
VLDL: 13.4 mg/dL (ref 0.0–40.0)

## 2023-05-01 LAB — VITAMIN D 25 HYDROXY (VIT D DEFICIENCY, FRACTURES): VITD: 50.93 ng/mL (ref 30.00–100.00)

## 2023-05-01 LAB — HEMOGLOBIN A1C: Hgb A1c MFr Bld: 5.9 % (ref 4.6–6.5)

## 2023-05-01 LAB — TSH: TSH: 1.08 u[IU]/mL (ref 0.35–5.50)

## 2023-05-02 ENCOUNTER — Encounter: Payer: Self-pay | Admitting: Family

## 2023-05-02 ENCOUNTER — Ambulatory Visit: Payer: Medicare PPO | Admitting: Nurse Practitioner

## 2023-05-02 ENCOUNTER — Ambulatory Visit: Payer: Medicare PPO | Admitting: Family

## 2023-05-02 ENCOUNTER — Encounter: Payer: Self-pay | Admitting: Internal Medicine

## 2023-05-02 ENCOUNTER — Ambulatory Visit: Payer: Self-pay | Admitting: Internal Medicine

## 2023-05-02 VITALS — BP 130/70 | HR 71 | Temp 98.1°F | Ht 61.0 in | Wt 128.4 lb

## 2023-05-02 DIAGNOSIS — L509 Urticaria, unspecified: Secondary | ICD-10-CM | POA: Insufficient documentation

## 2023-05-02 NOTE — Assessment & Plan Note (Signed)
No acute respiratory distress . urticaria of unknown etiology.  Hives resolved at time of visit.  Discussed starting Zyrtec over-the-counter twice daily for the next few days and then decrease to once daily and then stopping medication in the next week or two.  She may take Benadryl at bedtime if needed.   if hives persist, we will arrange consult with [facility] and/or allergy testing.

## 2023-05-02 NOTE — Telephone Encounter (Signed)
Copied from CRM [id number]. Topic: Clinical - Red Word Triage >> May 02, 2023  8:31 AM Pascal Lux wrote: Red Word that prompted transfer to Nurse Triage: Patient called stated her psoriasis is flaring up and she is experiencing hives and is in pain, having to stay on Benadryl 24/7.   Chief Complaint: hives Symptoms: redness and hives spreading from trunk and underarms to legs, moderate to severe itching Frequency: continual since Monday, intermittent with meds Pertinent Negatives: Patient denies SOB, chest pain, swelling, abdominal pain, recent exposure to new things, new joint pain, headache, sore throat, dizziness Disposition: [] 911 / [] ED /[] Urgent Care (no appt availability in office) / [x] Appointment(In office/virtual)/ []  [facility]/ [] Home Care/ [] Refused Recommended Disposition /[] [facility]/ []  Follow-up with PCP Additional Notes: Pt reporting noticed "best guess is hives" Monday night "clawing myself madly around stomach and armpits, was red all around trunk and underneath arms, took benadryl and went back to sleep." Pt reporting hx of psoriasis but her med has "kept it in check for year." Pt reporting "only other time" this skin issue has occurred was "3 days after surgery" a few months ago, spoke to surgeon, took benadryl and "resolved itself in 24 hours. Pt reporting hives appear when scratching, "totally guilty of scratching it," and look like "tiny white welts like head of pin." Pt reporting been "happening continually since then," when takes benadryl "it clears up" both itching and redness/hives, "sarna lotion also helping to calm it" with menthol in it. Pt confirms no SOB, swelling, "no changes to personal care routine. Pt confirms hives to trunk around "stomach, extends around to back, armpits" and spread "last night for first time from line of panties to my knees." Pt reporting she was recently on prednisone for "hip issue" that she finished over 2 weeks ago, concerned  about potential for prednisone again so soon. Advised pt be seen within 24 hours, scheduled with PCP office today, confirmed location/appt info with pt. Pt verbalized understanding to go to ED if severe and sudden spreading of hives, SOB, chest pain, swelling.  Reason for Disposition  [1] MODERATE-SEVERE hives persist (i.e., hives interfere with normal activities or work) AND [2] taking antihistamine (e.g., Benadryl, Claritin) > 24 hours  Answer Assessment - Initial Assessment Questions 1. APPEARANCE of RASH: "Describe the rash." (e.g., spots, blisters, raised areas, skin peeling, scaly)     Redness, white welts like head of pin 2. SIZE: "How big are the spots?" (e.g., tip of pen, eraser, coin; inches, centimeters)     Tip of pen 3. LOCATION: "Where is the rash located?"     Trunk around stomach extends around to back, armpit, last night for first time from leg of panties to my knees 4. COLOR: "What color is the rash?" (Note: It is difficult to assess rash color in people with darker-colored skin. When this situation occurs, simply ask the caller to describe what they see.)     red 5. ONSET: "When did the rash begin?"     Monday night 6. FEVER: "Do you have a fever?" If Yes, ask: "What is your temperature, how was it measured, and when did it start?"     denies 7. ITCHING: "Does the rash itch?" If Yes, ask: "How bad is the itch?" (Scale 1-10; or mild, moderate, severe)     Itching before resolves itself with benadryl, itching will be moderate to severe 9. MEDICINE FACTORS: "Have you started any new medicines within the last 2 weeks?" (e.g.,  antibiotics)      No changes to personal care routine, been over 2 weeks since finished prednisone for hip issue 10. OTHER SYMPTOMS: "Do you have any other symptoms?" (e.g., dizziness, headache, sore throat, joint pain)       Joint pain expected in shoulder otherwise joints are okay  Answer Assessment - Initial Assessment Questions 1. APPEARANCE: "What  does the rash look like?"      Redness, white welts like head of pin 2. LOCATION: "Where is the rash located?"      Trunk around stomach extends around to back, armpit, last night for first time from line of panties to my knees 4. SIZE: "How big are the hives?" (inches, cm, compare to coins) "Do they all look the same or is there lots of variation in shape and size?"      Head of pin 5. ONSET: "When did the hives begin?" (Hours or days ago)      Monday night 6. ITCHING: "Does it itch?" If Yes, ask: "How bad is the itch?"    - MILD: doesn't interfere with normal activities   - MODERATE-SEVERE: interferes with work, school, sleep, or other activities     Itching before resolves itself with benadryl, itching will be moderate to severe 7. RECURRENT PROBLEM: "Have you had hives before?" If Yes, ask: "When was the last time?" and "What happened that time?"      Yes recently after surgery in Fall 2024, helped with benadryl within 24 hours 8. TRIGGERS: "Were you exposed to any new food, plant, cosmetic product or animal just before the hives began?"     denies 9. OTHER SYMPTOMS: "Do you have any other symptoms?" (e.g., fever, tongue swelling, difficulty breathing, abdomen pain)       Joint pain expected in shoulder otherwise joints are okay, denies other symptoms  Protocols used: Rash or Redness - Widespread-A-AH, Hives-A-AH

## 2023-05-02 NOTE — Progress Notes (Signed)
Assessment & Plan:  Urticaria Assessment & Plan: No acute respiratory distress . urticaria of unknown etiology.  Hives resolved at time of visit.  Discussed starting Zyrtec over-the-counter twice daily for the next few days and then decrease to once daily and then stopping medication in the next week or two.  She may take Benadryl at bedtime if needed.   if hives persist, we will arrange consult with [facility] and/or allergy testing.      Return precautions given.   Risks, benefits, and alternatives of the medications and treatment plan prescribed today were discussed, and patient expressed understanding.   Education regarding symptom management and diagnosis given to patient on AVS either electronically or printed.  No follow-ups on file.  Rennie Plowman, FNP  Subjective:    Patient ID: April Santos, female    DOB: 24-Aug-1955, 68 y.o.   MRN: [id number]  CC: April Santos is a 68 y.o. female who presents today for an acute visit.    HPI: Complains episodic hives over chest , inside of arms x 5 days, improved. Describes as "pinpoint, red dots.'  Resolved at this time   Abrupt onset at 3am in the morning which she awoke  Resolved with benadryl and then recurred again several hours later.    She has been taking Benadryl daily.  Denies new detergents, new medication, new food  H/o psoriasis .  She follows with  skin     Denies SOB, trouble swallowing, difficultly swallowing, fever.  She had prednisone taper prescribed for low back pain 2 weeks ago.   Compliant with Aciphex 20 mg daily  History of GERD  Allergic to macrolides, cephalosporins  No history of CKD Allergies: Erythromycin ethylsuccinate [erythromycin] and Omnicef [cefdinir] Current Outpatient Medications on File Prior to Visit  Medication Sig Dispense Refill   ALPRAZolam (XANAX) 0.25 MG tablet Take 1 tablet (0.25 mg total) by mouth daily as needed. 15 tablet 0   Apremilast (OTEZLA) 30  MG TABS Take 1 tablet (30 mg total) by mouth daily. 30 tablet 5   B Complex-C (B-COMPLEX WITH VITAMIN C) tablet Take 1 tablet by mouth daily.     Biotin-Vitamin C (HAIR SKIN NAILS GUMMIES PO) Take 2 tablets by mouth daily. Olly     cholecalciferol (VITAMIN D3) 25 MCG (1000 UNIT) tablet Take 1,000 Units by mouth daily.     Crisaborole (EUCRISA) 2 % OINT Apply 1 application topically 2 (two) times daily as needed (psoriasis). 60 g 1   Cyanocobalamin (VITAMIN B-12) 3000 MCG SUBL Place 6,000 mcg under the tongue daily at 2 am. Gummy     desonide (DESOWEN) 0.05 % cream Apply 1 application topically 2 (two) times daily as needed (psoriasis).     estradiol (ESTRACE VAGINAL) 0.1 MG/GM vaginal cream Insert 1 gram vaginally 1-2 times weekly as maintenance 42.5 g 2   gabapentin (NEURONTIN) 300 MG capsule Take 300 mg by mouth as directed. Two caps in the AM and PM. 1 capsule at lunch.     ibuprofen (ADVIL) 200 MG tablet Take 600 mg by mouth every 6 (six) hours as needed for headache.     Multiple Vitamins-Minerals (MULTIVITAMIN WITH MINERALS) tablet Take 1 tablet by mouth daily. Olly     mupirocin ointment (BACTROBAN) 2 % Apply 1 Application topically daily as needed (Wound).     OVER THE COUNTER MEDICATION Take 2 tablets by mouth daily.  Olly Heavenly Hair  gummy     oxyCODONE (ROXICODONE) 5 MG immediate  release tablet Take 1-2 tablets (5-10 mg total) by mouth every 4 (four) hours as needed for moderate pain (pain score 4-6) or severe pain (pain score 7-10). 40 tablet 0   RABEprazole (ACIPHEX) 20 MG tablet Take 1 tablet (20 mg total) by mouth 2 (two) times daily.     silver sulfADIAZINE (SILVADENE) 1 % cream Apply 1 Application topically daily.     triamcinolone cream (KENALOG) 0.1 % Apply 1 Application topically 2 (two) times daily as needed. For up to 2 weeks. Avoid applying to face, groin, and axilla. Use as directed. Long-term use can cause thinning of the skin. 80 g 1   Wheat Dextrin (BENEFIBER) CHEW Chew  2 tablets by mouth every morning. Gummy     No current facility-administered medications on file prior to visit.    Review of Systems  Constitutional:  Negative for chills and fever.  Respiratory:  Negative for cough.   Cardiovascular:  Negative for chest pain and palpitations.  Gastrointestinal:  Negative for nausea and vomiting.  Skin:  Positive for rash.      Objective:    BP 130/70   Pulse 71   Temp 98.1 F (36.7 C) (Oral)   Ht 5\' 1"  (1.549 m)   Wt 128 lb 6.4 oz (58.2 kg)   LMP 02/26/2002   SpO2 98%   BMI 24.26 kg/m   BP Readings from Last 3 Encounters:  05/02/23 130/70  03/18/23 118/70  03/04/23 109/72   Wt Readings from Last 3 Encounters:  05/02/23 128 lb 6.4 oz (58.2 kg)  04/15/23 130 lb (59 kg)  03/18/23 130 lb (59 kg)    Physical Exam Vitals reviewed.  Constitutional:      Appearance: She is well-developed.  HENT:     Mouth/Throat:     Tongue: No lesions. Tongue does not deviate from midline.     Pharynx: Oropharynx is clear. Uvula midline. No pharyngeal swelling, posterior oropharyngeal erythema or uvula swelling.  Eyes:     Conjunctiva/sclera: Conjunctivae normal.  Cardiovascular:     Rate and Rhythm: Normal rate and regular rhythm.     Pulses: Normal pulses.     Heart sounds: Normal heart sounds.  Pulmonary:     Effort: Pulmonary effort is normal.     Breath sounds: Normal breath sounds. No wheezing, rhonchi or rales.  Skin:    General: Skin is warm and dry.  Neurological:     Mental Status: She is alert.  Psychiatric:        Speech: Speech normal.        Behavior: Behavior normal.        Thought Content: Thought content normal.

## 2023-05-02 NOTE — Patient Instructions (Signed)
    Hives of unknown cause  Start triamcinolone 0.1 % cream apply thin layer to affected areas of rash qd/bid prn.    Avoid applying to face, groin, and axilla. Use as directed. Long-term use can cause thinning of the skin. Topical steroids (such as triamcinolone, fluocinolone, fluocinonide, mometasone, clobetasol, halobetasol, betamethasone, hydrocortisone) can cause thinning and lightening of the skin if they are used for too long in the same area. Please use your medication only as directed to prevent side effects.    In morning take  For Hives (urticaria)-  Start non sedating antihistamine (either Allegra 180mg , or Claritin 10mg , or Zyrtec 10mg ) daily.  All these are non-prescription ("Over the Counter").   Start out with 1 pill a day.   After a week if not improving may increase to 2 pills a day.      Recommend OTC Gold Bond Rapid Relief Anti-Itch cream (pramoxine + menthol), CeraVe Anti-itch cream or lotion (pramoxine), Sarna lotion (Original- menthol + camphor or Sensitive- pramoxine) or Eucerin 12 hour Itch Relief lotion (menthol) up to 3 times per day to areas on body that are itchy.    May continue benadryl if you would, maybe at bedtime.    Tips on Aggravating factors --    These include:  ?Physical factors -  As an example, heat (hot showers, extreme humidity) is a common trigger for many , and tight clothing or straps can also aggravate symptoms.   ?Anti-inflammatory medications - Nonsteroidal anti-inflammatory drugs (NSAIDs) worsen symptoms   ?Stress - Patients often report more severe symptoms during periods of physical or psychologic stress   ?Variations in dietary habits and alcohol - Although food allergy is a rare cause of, some patients will report that variations in diet, particularly rich meals or spicy foods, will aggravate symptoms. Alcohol also aggravates symptoms in some.  If hives do not improve, please let me know and remember :

## 2023-05-05 DIAGNOSIS — Z87411 Personal history of vaginal dysplasia: Secondary | ICD-10-CM | POA: Insufficient documentation

## 2023-05-05 NOTE — Progress Notes (Unsigned)
PCP: Dale [place], MD   No chief complaint on file.   HPI:      [name] is a 68 y.o. No obstetric history on file. whose LMP was Patient's last menstrual period was 02/26/2002., presents today for her Medicare annual examination.  Her menses are absent due to menopause/hyst for leio in her 50s; question if BSO. No PMB. Started on estradiol 1 mg after hyst, changed to vivelle dot 0.05 mg with Dr. Jerene Pitch 4/22 due to concern of high dose of estrogen, weaned off last yr. Has occas vasomotor sx, but also under stress/pain with RT hip issue.   Sex activity: single partner, contraception - post hysterectomy status. She does have vaginal dryness. Uses estrace vag crm once wkly and lubricants with mostly sx control. Wants to cont vag ERT.  Last Pap: 04/23/22 Results were NILM; 04/19/21 Reslts were ASCUS/neg HPV DNA; 03/20/20 Results were: low-grade squamous intraepithelial neoplasia (LGSIL - encompassing HPV,mild dysplasia,CIN I) /POS HPV DNA; pt without cx, no hx of CIN 2or 3. Will repeat pap today.  Hx of STDs: HPV  Last mammogram: 12/24/22 at [facility];  Results were: normal--routine follow-up in 12 months There is a FH of breast cancer in her mother twice, genetic testing not done. Pt doesn't meet Medicare guidelines for testing. There is no FH of ovarian cancer. The patient does occas do self-breast exams.  Colonoscopy: 2018 with Dr. Mechele Collin with 3 polyps; Repeat due after 10 years per pt.  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 Alcohol use:  rare No drug use Exercise: moderately active before hip issues; seeing neuro today.  She does get adequate calcium and Vitamin D in her diet.  Labs with PCP.   Patient Active Problem List   Diagnosis Date Noted   Urticaria 05/02/2023   Rotator cuff tear 01/10/2023   Diarrhea 09/22/2022   Dysphagia 09/22/2022   ASCUS of cervix with negative high risk  HPV 04/22/2022   Hand pain, left 03/13/2022   Right ankle pain 11/08/2021   Right hip pain 07/07/2021   Varicose veins of leg with swelling, bilateral 07/07/2021   Post-menopausal atrophic vaginitis 04/19/2021   LGSIL Pap smear of vagina 04/19/2021   Family history of breast cancer 04/19/2021   Arthritis of carpometacarpal (CMC) joint of right thumb 11/26/2020   History of COVID-19 10/31/2020   Hyperglycemia 08/22/2020   Breast cancer screening 01/30/2020   Vitamin D deficiency 08/02/2018   Generalized osteoarthritis of hand 09/04/2017   Right hand pain 03/11/2016   Psoriatic arthritis (HCC) 02/11/2016   Left shoulder pain 02/05/2016   Low iron 06/05/2015   Skin lesion 06/05/2015   Current use of estrogen therapy 11/29/2014   HNP (herniated nucleus pulposus), lumbar 10/20/2014   Lumbar radiculopathy 10/20/2014   Leg pain, left 05/16/2014   Health care maintenance 05/16/2014   Stress 10/17/2013   Osteoarthritis of knee 09/15/2013   Sacroiliitis (HCC) 08/17/2013   Right knee pain 07/20/2013   Abnormal mammogram 06/12/2013   Toenail fungus 06/12/2013   Osteopenia 05/14/2013   Carpal tunnel syndrome 11/26/2012   Environmental allergies 05/03/2012   History of condyloma acuminatum 05/03/2012   GERD (gastroesophageal reflux disease) 05/03/2012    Past Surgical History:  Procedure Laterality Date   ABDOMINAL HYSTERECTOMY     BREAST SURGERY  1999   biopsy   carpal  2005   Tunnel right hand  CARPOMETACARPAL (CMC) FUSION OF THUMB Right 11/16/2020   Procedure: Right CARPOMETACARPAL [facility]) ARTHROPLASTY;  Surgeon: Kennedy Bucker, MD;  Location: [facility] ORS;  Service: Orthopedics;  Laterality: Right;   COLONOSCOPY WITH ESOPHAGOGASTRODUODENOSCOPY (EGD)     COLONOSCOPY WITH PROPOFOL N/A 06/10/2016   Procedure: COLONOSCOPY WITH PROPOFOL;  Surgeon: Scot Jun, MD;  Location: [facility] ENDOSCOPY;  Service: Endoscopy;  Laterality: N/A;   ESOPHAGOGASTRODUODENOSCOPY (EGD) WITH PROPOFOL N/A  06/10/2016   Procedure: ESOPHAGOGASTRODUODENOSCOPY (EGD) WITH PROPOFOL;  Surgeon: Scot Jun, MD;  Location: [facility] ENDOSCOPY;  Service: Endoscopy;  Laterality: N/A;   FOOT SURGERY  02/07/1003   right foot benign tumor   REVERSE SHOULDER ARTHROPLASTY Left 03/04/2023   Procedure: REVERSE SHOULDER ARTHROPLASTY;  Surgeon: Christena Flake, MD;  Location: [facility] ORS;  Service: Orthopedics;  Laterality: Left;   SACROILIAC JOINT INJECTION  08/17/13 & 04/13/14   Dr. Yves Dill   SIGMOIDOSCOPY     UTERINE FIBROID SURGERY  1997    Family History  Problem Relation Age of Onset   Breast cancer Mother        32/74   Heart disease Father    Arthritis Father    GER disease Father    Diabetes Brother    Diabetes Maternal Uncle    Colon cancer Neg Hx     Social History   Socioeconomic History   Marital status: Significant Other    Spouse name: [name]   Number of children: 0   Years of education: Not on file   Highest education level: Not on file  Occupational History   Not on file  Tobacco Use   Smoking status: Never   Smokeless tobacco: Never  Vaping Use   Vaping status: Never Used  Substance and Sexual Activity   Alcohol use: Yes    Alcohol/week: 1.0 standard drink of alcohol    Types: 1 Glasses of wine per week    Comment: rare   Drug use: No   Sexual activity: Yes    Birth control/protection: Surgical    Comment: Hysterectomy  Other Topics Concern   Not on file  Social History Narrative   [name] - POA   Social Drivers of Health   Financial Resource Strain: Low Risk  (01/22/2023)   Overall Financial Resource Strain (CARDIA)    Difficulty of Paying Living Expenses: Not hard at all  Food Insecurity: No Food Insecurity (01/22/2023)   Hunger Vital Sign    Worried About Running Out of Food in the Last Year: Never true    Ran Out of Food in the Last Year: Never true  Transportation Needs: No Transportation Needs (01/22/2023)   PRAPARE - Scientist, research (physical sciences) (Medical): No    Lack of Transportation (Non-Medical): No  Physical Activity: Sufficiently Active (01/22/2023)   Exercise Vital Sign    Days of Exercise per Week: 5 days    Minutes of Exercise per Session: 40 min  Stress: Stress Concern Present (01/22/2023)   [employer] of Occupational Health - Occupational Stress Questionnaire    Feeling of Stress : To some extent  Social Connections: Moderately Integrated (01/22/2023)   Social Connection and Isolation Panel [NHANES]    Frequency of Communication with Friends and Family: More than three times a week    Frequency of Social Gatherings with Friends and Family: More than three times a week    Attends Religious Services: More than 4 times per year    Active Member of [employer] or Organizations:  No    Attends Club or Organization Meetings: Never    Marital Status: Living with partner  Intimate Partner Violence: Not At Risk (01/22/2023)   Humiliation, Afraid, Rape, and Kick questionnaire    Fear of Current or Ex-Partner: No    Emotionally Abused: No    Physically Abused: No    Sexually Abused: No     Current Outpatient Medications:    ALPRAZolam (XANAX) 0.25 MG tablet, Take 1 tablet (0.25 mg total) by mouth daily as needed., Disp: 15 tablet, Rfl: 0   Apremilast (OTEZLA) 30 MG TABS, Take 1 tablet (30 mg total) by mouth daily., Disp: 30 tablet, Rfl: 5   B Complex-C (B-COMPLEX WITH VITAMIN C) tablet, Take 1 tablet by mouth daily., Disp: , Rfl:    Biotin-Vitamin C (HAIR SKIN NAILS GUMMIES PO), Take 2 tablets by mouth daily. Olly, Disp: , Rfl:    cholecalciferol (VITAMIN D3) 25 MCG (1000 UNIT) tablet, Take 1,000 Units by mouth daily., Disp: , Rfl:    Crisaborole (EUCRISA) 2 % OINT, Apply 1 application topically 2 (two) times daily as needed (psoriasis)., Disp: 60 g, Rfl: 1   Cyanocobalamin (VITAMIN B-12) 3000 MCG SUBL, Place 6,000 mcg under the tongue daily at 2 am. Gummy, Disp: , Rfl:    desonide (DESOWEN) 0.05 % cream,  Apply 1 application topically 2 (two) times daily as needed (psoriasis)., Disp: , Rfl:    estradiol (ESTRACE VAGINAL) 0.1 MG/GM vaginal cream, Insert 1 gram vaginally 1-2 times weekly as maintenance, Disp: 42.5 g, Rfl: 2   gabapentin (NEURONTIN) 300 MG capsule, Take 300 mg by mouth as directed. Two caps in the AM and PM. 1 capsule at lunch., Disp: , Rfl:    ibuprofen (ADVIL) 200 MG tablet, Take 600 mg by mouth every 6 (six) hours as needed for headache., Disp: , Rfl:    Multiple Vitamins-Minerals (MULTIVITAMIN WITH MINERALS) tablet, Take 1 tablet by mouth daily. Olly, Disp: , Rfl:    mupirocin ointment (BACTROBAN) 2 %, Apply 1 Application topically daily as needed (Wound)., Disp: , Rfl:    OVER THE COUNTER MEDICATION, Take 2 tablets by mouth daily.  Olly Heavenly Hair  gummy, Disp: , Rfl:    oxyCODONE (ROXICODONE) 5 MG immediate release tablet, Take 1-2 tablets (5-10 mg total) by mouth every 4 (four) hours as needed for moderate pain (pain score 4-6) or severe pain (pain score 7-10)., Disp: 40 tablet, Rfl: 0   RABEprazole (ACIPHEX) 20 MG tablet, Take 1 tablet (20 mg total) by mouth 2 (two) times daily., Disp: , Rfl:    silver sulfADIAZINE (SILVADENE) 1 % cream, Apply 1 Application topically daily., Disp: , Rfl:    triamcinolone cream (KENALOG) 0.1 %, Apply 1 Application topically 2 (two) times daily as needed. For up to 2 weeks. Avoid applying to face, groin, and axilla. Use as directed. Long-term use can cause thinning of the skin., Disp: 80 g, Rfl: 1   Wheat Dextrin (BENEFIBER) CHEW, Chew 2 tablets by mouth every morning. Gummy, Disp: , Rfl:      ROS:  Review of Systems  Constitutional:  Negative for fatigue, fever and unexpected weight change.  Respiratory:  Negative for cough, shortness of breath and wheezing.   Cardiovascular:  Negative for chest pain, palpitations and leg swelling.  Gastrointestinal:  Negative for blood in stool, constipation, diarrhea, nausea and vomiting.  Endocrine:  Negative for cold intolerance, heat intolerance and polyuria.  Genitourinary:  Negative for dyspareunia, dysuria, flank pain, frequency, genital sores, hematuria,  menstrual problem, pelvic pain, urgency, vaginal bleeding, vaginal discharge and vaginal pain.  Musculoskeletal:  Positive for arthralgias. Negative for back pain, joint swelling and myalgias.  Skin:  Negative for rash.  Neurological:  Negative for dizziness, syncope, light-headedness, numbness and headaches.  Hematological:  Negative for adenopathy.  Psychiatric/Behavioral:  Negative for agitation, confusion, sleep disturbance and suicidal ideas. The patient is not nervous/anxious.    BREAST: No symptoms    Objective: LMP 02/26/2002    Physical Exam Constitutional:      Appearance: She is well-developed.  Genitourinary:     Vulva normal.     Genitourinary Comments: UTERUS/CX SURG REM     Right Labia: No rash, tenderness or lesions.    Left Labia: No tenderness, lesions or rash.    Vaginal cuff intact.    No vaginal discharge, erythema or tenderness.      Right Adnexa: not tender and no mass present.    Left Adnexa: not tender and no mass present.    Cervix is absent.     Uterus is absent.  Breasts:    Right: No mass, nipple discharge, skin change or tenderness.     Left: No mass, nipple discharge, skin change or tenderness.  Neck:     Thyroid: No thyromegaly.  Cardiovascular:     Rate and Rhythm: Normal rate and regular rhythm.     Heart sounds: Normal heart sounds. No murmur heard. Pulmonary:     Effort: Pulmonary effort is normal.     Breath sounds: Normal breath sounds.  Abdominal:     Palpations: Abdomen is soft.     Tenderness: There is no abdominal tenderness. There is no guarding.  Musculoskeletal:        General: Normal range of motion.     Cervical back: Normal range of motion.  Neurological:     General: No focal deficit present.     Mental Status: She is alert and oriented to person, place, and  time.     Cranial Nerves: No cranial nerve deficit.  Skin:    General: Skin is warm and dry.  Psychiatric:        Mood and Affect: Mood normal.        Behavior: Behavior normal.        Thought Content: Thought content normal.        Judgment: Judgment normal.  Vitals reviewed.     Assessment/Plan:  Encounter for annual routine gynecological examination  Cervical cancer screening - Plan: Cytology - PAP  ASCUS of cervix with negative high risk HPV - Plan: Cytology - PAP; repeat pap today. Will f/u with results  Encounter for screening mammogram for malignant neoplasm of breast; pt to schedule mammo 9/24  Family history of breast cancer--pt doesn't meet Medicare genetic testing guidelines but is at increased risk due to FH. Cont to follow FH. Cont yearly mammos.   Vasomotor symptoms due to menopause---sx resolved, even off ERT. F/u prn.   Post-menopausal atrophic vaginitis - Plan: estradiol (ESTRACE VAGINAL) 0.1 MG/GM vaginal cream; Rx RF. Try coconut oil as lubricant, increase vag ERT to twice wkly prn. F/u prn.    No orders of the defined types were placed in this encounter.          GYN counsel breast self exam, mammography screening, use and side effects of HRT, menopause, adequate intake of calcium and vitamin D, diet and exercise    F/U  No follow-ups on file.  Marylan Glore B. Kaevion Sinclair, PA-C 05/05/2023 3:18  PM

## 2023-05-06 ENCOUNTER — Ambulatory Visit (INDEPENDENT_AMBULATORY_CARE_PROVIDER_SITE_OTHER): Payer: Medicare PPO | Admitting: Obstetrics and Gynecology

## 2023-05-06 ENCOUNTER — Encounter: Payer: Self-pay | Admitting: Obstetrics and Gynecology

## 2023-05-06 ENCOUNTER — Other Ambulatory Visit (HOSPITAL_COMMUNITY)
Admission: RE | Admit: 2023-05-06 | Discharge: 2023-05-06 | Disposition: A | Discharge status: Home / Self Care | Payer: Medicare PPO | Source: Ambulatory Visit | Attending: Obstetrics and Gynecology | Admitting: Obstetrics and Gynecology

## 2023-05-06 VITALS — BP 122/71 | HR 69 | Ht 61.0 in | Wt 125.0 lb

## 2023-05-06 DIAGNOSIS — Z1272 Encounter for screening for malignant neoplasm of vagina: Secondary | ICD-10-CM | POA: Insufficient documentation

## 2023-05-06 DIAGNOSIS — Z87411 Personal history of vaginal dysplasia: Secondary | ICD-10-CM | POA: Diagnosis not present

## 2023-05-06 DIAGNOSIS — H52223 Regular astigmatism, bilateral: Secondary | ICD-10-CM | POA: Diagnosis not present

## 2023-05-06 DIAGNOSIS — H2513 Age-related nuclear cataract, bilateral: Secondary | ICD-10-CM | POA: Diagnosis not present

## 2023-05-06 DIAGNOSIS — Z135 Encounter for screening for eye and ear disorders: Secondary | ICD-10-CM | POA: Diagnosis not present

## 2023-05-06 DIAGNOSIS — Z1231 Encounter for screening mammogram for malignant neoplasm of breast: Secondary | ICD-10-CM

## 2023-05-06 DIAGNOSIS — Z01419 Encounter for gynecological examination (general) (routine) without abnormal findings: Secondary | ICD-10-CM | POA: Diagnosis not present

## 2023-05-06 DIAGNOSIS — N952 Postmenopausal atrophic vaginitis: Secondary | ICD-10-CM

## 2023-05-06 DIAGNOSIS — Z803 Family history of malignant neoplasm of breast: Secondary | ICD-10-CM

## 2023-05-06 DIAGNOSIS — H5213 Myopia, bilateral: Secondary | ICD-10-CM | POA: Diagnosis not present

## 2023-05-06 MED ORDER — ESTRADIOL 0.1 MG/GM VA CREA: TOPICAL_CREAM | VAGINAL | 2 refills | Status: AC

## 2023-05-06 NOTE — Patient Instructions (Signed)
I value your feedback and you entrusting [place] with your care. If you get a  patient survey, I would appreciate you taking the time to let us know about your experience today. Thank you! ? ? ?

## 2023-05-10 LAB — CYTOLOGY - PAP: Diagnosis: NEGATIVE

## 2023-05-12 DIAGNOSIS — R29898 Other symptoms and signs involving the musculoskeletal system: Secondary | ICD-10-CM | POA: Diagnosis not present

## 2023-05-16 DIAGNOSIS — Z96612 Presence of left artificial shoulder joint: Secondary | ICD-10-CM | POA: Diagnosis not present

## 2023-05-19 DIAGNOSIS — Z96612 Presence of left artificial shoulder joint: Secondary | ICD-10-CM | POA: Diagnosis not present

## 2023-05-22 DIAGNOSIS — Z96612 Presence of left artificial shoulder joint: Secondary | ICD-10-CM | POA: Diagnosis not present

## 2023-05-27 DIAGNOSIS — Z96612 Presence of left artificial shoulder joint: Secondary | ICD-10-CM | POA: Diagnosis not present

## 2023-06-02 DIAGNOSIS — Z96612 Presence of left artificial shoulder joint: Secondary | ICD-10-CM | POA: Diagnosis not present

## 2023-06-03 ENCOUNTER — Encounter: Payer: Self-pay | Admitting: Dermatology

## 2023-06-03 ENCOUNTER — Ambulatory Visit: Payer: Medicare PPO | Admitting: Dermatology

## 2023-06-03 DIAGNOSIS — I781 Nevus, non-neoplastic: Secondary | ICD-10-CM

## 2023-06-03 DIAGNOSIS — I8393 Asymptomatic varicose veins of bilateral lower extremities: Secondary | ICD-10-CM

## 2023-06-03 DIAGNOSIS — Z7189 Other specified counseling: Secondary | ICD-10-CM

## 2023-06-03 DIAGNOSIS — L988 Other specified disorders of the skin and subcutaneous tissue: Secondary | ICD-10-CM

## 2023-06-03 NOTE — Patient Instructions (Addendum)
 Counseling for BBL / IPL / Laser and Coordination of Care Discussed the treatment option of Broad Band Light (BBL) /Intense Pulsed Light (IPL)/ Laser for skin discoloration, including brown spots and redness.  Typically we recommend at least 1-3 treatment sessions about 5-8 weeks apart for best results.  Cannot have tanned skin when BBL performed, and regular use of sunscreen/photoprotection is advised after the procedure to help maintain results. The patient's condition may also require "maintenance treatments" in the future.  The fee for BBL / laser treatments is $350 per treatment session for the whole face.  A fee can be quoted for other parts of the body.  Insurance typically does not pay for BBL/laser treatments and therefore the fee is an out-of-pocket cost. Recommend prophylactic valtrex treatment. Once scheduled for procedure, will send Rx in prior to patient's appointment.     Due to recent changes in healthcare laws, you may see results of your pathology and/or laboratory studies on MyChart before the doctors have had a chance to review them. We understand that in some cases there may be results that are confusing or concerning to you. Please understand that not all results are received at the same time and often the doctors may need to interpret multiple results in order to provide you with the best plan of care or course of treatment. Therefore, we ask that you please give [place] 2 business days to thoroughly review all your results before contacting the office for clarification. Should we see a critical lab result, you will be contacted sooner.   If You Need Anything After Your Visit  If you have any questions or concerns for your doctor, please call our main line at [phone] and press option 4 to reach your doctor's medical assistant. If no one answers, please leave a voicemail as directed and we will return your call as soon as possible. Messages left after 4 pm will be answered the  following business day.   You may also send [place] a message via MyChart. We typically respond to MyChart messages within 1-2 business days.  For prescription refills, please ask your pharmacy to contact our office. Our fax number is [phone].  If you have an urgent issue when the clinic is closed that cannot wait until the next business day, you can page your doctor at the number below.    Please note that while we do our best to be available for urgent issues outside of office hours, we are not available 24/7.   If you have an urgent issue and are unable to reach [place], you may choose to seek medical care at your doctor's office, retail clinic, urgent care center, or emergency room.  If you have a medical emergency, please immediately call 911 or go to the emergency department.  Pager Numbers  - Dr. Gwen Pounds: [phone]  - Dr. Roseanne Reno: [phone]  - Dr. Katrinka Blazing: [phone]   In the event of inclement weather, please call our main line at [phone] for an update on the status of any delays or closures.  Dermatology Medication Tips: Please keep the boxes that topical medications come in in order to help keep track of the instructions about where and how to use these. Pharmacies typically print the medication instructions only on the boxes and not directly on the medication tubes.   If your medication is too expensive, please contact our office at [phone] option 4 or send [place] a message through MyChart.   We are unable to tell what  your co-pay for medications will be in advance as this is different depending on your insurance coverage. However, we may be able to find a substitute medication at lower cost or fill out paperwork to get insurance to cover a needed medication.   If a prior authorization is required to get your medication covered by your insurance company, please allow [place] 1-2 business days to complete this process.  Drug prices often vary depending on where the  prescription is filled and some pharmacies may offer cheaper prices.  The website www.goodrx.com contains coupons for medications through different pharmacies. The prices here do not account for what the cost may be with help from insurance (it may be cheaper with your insurance), but the website can give you the price if you did not use any insurance.  - You can print the associated coupon and take it with your prescription to the pharmacy.  - You may also stop by our office during regular business hours and pick up a GoodRx coupon card.  - If you need your prescription sent electronically to a different pharmacy, notify our office through [facility] or by phone at [phone] option 4.     Si Usted Necesita Algo Despus de Su Visita  Tambin puede enviarnos un mensaje a travs de Clinical cytogeneticist. Por lo general respondemos a los mensajes de MyChart en el transcurso de 1 a 2 das hbiles.  Para renovar recetas, por favor pida a su farmacia que se ponga en contacto con nuestra oficina. Annie Sable de fax es [place] [phone].  Si tiene un asunto urgente cuando la clnica est cerrada y que no puede esperar hasta el siguiente da hbil, puede llamar/localizar a su doctor(a) al nmero que aparece a continuacin.   Por favor, tenga en cuenta que aunque hacemos todo lo posible para estar disponibles para asuntos urgentes fuera del horario de [place], no estamos disponibles las 24 horas del da, los 7 [address]  [address] de la [place].   Si tiene un problema urgente y no puede comunicarse con nosotros, puede optar por buscar atencin mdica  en el consultorio de su doctor(a), en una clnica privada, en un centro de atencin urgente o en una sala de emergencias.  Si tiene Engineer, drilling, por favor llame inmediatamente al 911 o vaya a la sala de emergencias.  Nmeros de bper  - Dr. Gwen Pounds: [phone]  - Dra. Roseanne Reno: [phone]  - Dr. Katrinka Blazing: [phone]   En caso de inclemencias del  tiempo, por favor llame a Lacy Duverney principal al [phone] para una actualizacin sobre el [place] de cualquier retraso o cierre.  Consejos para la medicacin en dermatologa: Por favor, guarde las cajas en las que vienen los medicamentos de uso tpico para ayudarle a seguir las instrucciones sobre dnde y cmo usarlos. Las farmacias generalmente imprimen las instrucciones del medicamento slo en las cajas y no directamente en los tubos del [place].   Si su medicamento es muy caro, por favor, pngase en contacto con Rolm Gala llamando al [phone] y presione la opcin 4 o envenos un mensaje a travs de Clinical cytogeneticist.   No podemos decirle cul ser su copago por los medicamentos por adelantado ya que esto es diferente dependiendo de la cobertura de su seguro. Sin embargo, es posible que podamos encontrar un medicamento sustituto a Audiological scientist un formulario para que el seguro cubra el medicamento que se considera necesario.   Si se requiere una autorizacin previa para que su compaa de seguros [place] su  medicamento, por favor permtanos de 1 a 2 das hbiles para completar [address]  Los precios de los medicamentos varan con frecuencia dependiendo del Environmental consultant de dnde se surte la receta y alguna farmacias pueden ofrecer precios ms baratos.  El sitio web www.goodrx.com tiene cupones para medicamentos de Health and safety inspector. Los precios aqu no tienen en cuenta lo que podra costar con la ayuda del seguro (puede ser ms barato con su seguro), pero el sitio web puede darle el precio si no utiliz Tourist information centre manager.  - Puede imprimir el cupn correspondiente y llevarlo con su receta a la farmacia.  - Tambin puede pasar por nuestra oficina durante el horario de atencin regular y Education officer, museum una tarjeta de cupones de GoodRx.  - Si necesita que su receta se enve electrnicamente a una farmacia diferente, informe a nuestra oficina a travs de MyChart de [place] o por telfono  llamando al [phone] y presione la opcin 4.

## 2023-06-03 NOTE — Progress Notes (Signed)
 Follow-Up Visit   Subjective  April Santos is a 68 y.o. female who presents for the following: Botox for facial elastosis Would like to discuss fillers  Would like to discuss veins in rght leg Seen by Dr. Wyn Quaker and had IVLA and sclero with good results, but has a few spider veins left on R leg  Discussed botox at lips  The following portions of the chart were reviewed this encounter and updated as appropriate: medications, allergies, medical history  Review of Systems:  No other skin or systemic complaints except as noted in HPI or Assessment and Plan.  Objective  Well appearing patient in no apparent distress; mood and affect are within normal limits.  A focused examination was performed of the face.  Relevant physical exam findings are noted in the Assessment and Plan.                       Assessment & Plan   ELASTOSIS OF SKIN   Facial Elastosis Botox 71.5 units  - Frown Complex 37.5 units - Brow lift 5 units x 2 - Crows feet 10 units x 2 - upper lip  4 units   Discussed Filler today - recommend lateral chin and oral commissures Photos today    Location: frown complex, brow lift, crow's feet, upper lip  Informed consent: Discussed risks (infection, pain, bleeding, bruising, swelling, allergic reaction, paralysis of nearby muscles, eyelid droop, double vision, neck weakness, difficulty breathing, headache, undesirable cosmetic result, and need for additional treatment) and benefits of the procedure, as well as the alternatives.  Informed consent was obtained.  Preparation: The area was cleansed with alcohol.  Procedure Details:  Botox was injected into the dermis with a 30-gauge needle. Pressure applied to any bleeding. Ice packs offered for swelling.  Lot Number:  [id number] Expiration:  06/2025  Total Units Injected:  71.5  Plan: Tylenol may be used for headache.  Allow 2 weeks before returning to clinic for additional dosing as needed.  Patient will call for any problems.   Varicose Veins/Spider Veins - Dilated blue, purple or red veins at the lower extremities  see photos taken today  - Reassured - Smaller vessels can be treated by sclerotherapy (a procedure to inject a medicine into the veins to make them disappear) if desired, but the treatment is not covered by insurance. Larger vessels may be covered if symptomatic and we would refer to vascular surgeon if treatment desired.  Been seen and treated by Dr. Wyn Quaker   Counseling for BBL / IPL / Laser and Coordination of Care Discussed the treatment option of Broad Band Light (BBL) Windell Moulding Pulsed Light (IPL)/ Laser for skin discoloration, including brown spots and redness.  Typically we recommend at least 1-3 treatment sessions about 5-8 weeks apart for best results.  Cannot have tanned skin when BBL performed, and regular use of sunscreen/photoprotection is advised after the procedure to help maintain results. The patient's condition may also require "maintenance treatments" in the future.  The fee for BBL / laser treatments is $350 per treatment session for the whole face.  A fee can be quoted for other parts of the body.  Insurance typically does not pay for BBL/laser treatments and therefore the fee is an out-of-pocket cost. Recommend prophylactic valtrex treatment. Once scheduled for procedure, will send Rx in prior to patient's appointment.   Return for 3  month botox .  IAsher Muir, CMA, am acting as scribe for April Sans,  MD.   Documentation: I have reviewed the above documentation for accuracy and completeness, and I agree with the above.  April Sans, MD

## 2023-06-04 DIAGNOSIS — M75122 Complete rotator cuff tear or rupture of left shoulder, not specified as traumatic: Secondary | ICD-10-CM | POA: Diagnosis not present

## 2023-06-04 DIAGNOSIS — M7582 Other shoulder lesions, left shoulder: Secondary | ICD-10-CM | POA: Diagnosis not present

## 2023-06-04 DIAGNOSIS — Z96612 Presence of left artificial shoulder joint: Secondary | ICD-10-CM | POA: Diagnosis not present

## 2023-06-05 DIAGNOSIS — Z96612 Presence of left artificial shoulder joint: Secondary | ICD-10-CM | POA: Diagnosis not present

## 2023-06-10 DIAGNOSIS — M549 Dorsalgia, unspecified: Secondary | ICD-10-CM | POA: Diagnosis not present

## 2023-06-10 DIAGNOSIS — M533 Sacrococcygeal disorders, not elsewhere classified: Secondary | ICD-10-CM | POA: Diagnosis not present

## 2023-06-10 DIAGNOSIS — M25551 Pain in right hip: Secondary | ICD-10-CM | POA: Diagnosis not present

## 2023-06-10 DIAGNOSIS — Z96612 Presence of left artificial shoulder joint: Secondary | ICD-10-CM | POA: Diagnosis not present

## 2023-06-13 DIAGNOSIS — Z96612 Presence of left artificial shoulder joint: Secondary | ICD-10-CM | POA: Diagnosis not present

## 2023-06-16 ENCOUNTER — Encounter: Payer: Self-pay | Admitting: Internal Medicine

## 2023-06-16 ENCOUNTER — Ambulatory Visit: Payer: Medicare PPO | Admitting: Internal Medicine

## 2023-06-16 VITALS — BP 120/70 | HR 65 | Temp 97.4°F | Ht 61.0 in | Wt 128.6 lb

## 2023-06-16 DIAGNOSIS — F439 Reaction to severe stress, unspecified: Secondary | ICD-10-CM

## 2023-06-16 DIAGNOSIS — M5416 Radiculopathy, lumbar region: Secondary | ICD-10-CM | POA: Diagnosis not present

## 2023-06-16 DIAGNOSIS — L4050 Arthropathic psoriasis, unspecified: Secondary | ICD-10-CM

## 2023-06-16 DIAGNOSIS — R739 Hyperglycemia, unspecified: Secondary | ICD-10-CM

## 2023-06-16 DIAGNOSIS — K219 Gastro-esophageal reflux disease without esophagitis: Secondary | ICD-10-CM | POA: Diagnosis not present

## 2023-06-16 DIAGNOSIS — E559 Vitamin D deficiency, unspecified: Secondary | ICD-10-CM

## 2023-06-16 DIAGNOSIS — Z96612 Presence of left artificial shoulder joint: Secondary | ICD-10-CM | POA: Diagnosis not present

## 2023-06-16 DIAGNOSIS — R87622 Low grade squamous intraepithelial lesion on cytologic smear of vagina (LGSIL): Secondary | ICD-10-CM

## 2023-06-16 DIAGNOSIS — M533 Sacrococcygeal disorders, not elsewhere classified: Secondary | ICD-10-CM

## 2023-06-16 NOTE — Progress Notes (Signed)
 Subjective:    Patient ID: April Santos, female    DOB: 04-12-1955, 68 y.o.   MRN: [id number]  Patient here for  Chief Complaint  Patient presents with   Medical Management of Chronic Issues    HPI Here for a scheduled follow up.  Is status post reverse left total shoulder arthroplasty for a massive rotator cuff tear 03/04/2023.  Has been followed by orthopedics.  Has been going to physical therapy. Dry needling. Has f/u planned with Dr Joice Lofts. She did see ortho recently experiencing pain - tailbone following a series of falls. The initial fall occurred the same day attended PT and used a donut cushion. Apparently started to improve, but experienced two additional falls. One in the bathtub at the end of December and another two weeks ago - tripped over a puppy gate - landing on her bottom.  Aggravated her tail bone. During her 06/10/23 ortho visit - also reported pain - down right leg. Recommended continuing gabapentin and has been informed could increase to tid. Consider epidural injection.    Past Medical History:  Diagnosis Date   Arthritis    Colon polyps    COVID-19 2022   Disc disorder    Dysphagia    Family history of adverse reaction to anesthesia    mother nausea   GERD (gastroesophageal reflux disease)    History of uterine fibroid    Psoriasis    Past Surgical History:  Procedure Laterality Date   ABDOMINAL HYSTERECTOMY     BREAST SURGERY  1999   biopsy   carpal  2005   Tunnel right hand   CARPOMETACARPAL ([facility]) FUSION OF THUMB Right 11/16/2020   Procedure: Right CARPOMETACARPAL [facility]) ARTHROPLASTY;  Surgeon: Kennedy Bucker, MD;  Location: [facility] ORS;  Service: Orthopedics;  Laterality: Right;   COLONOSCOPY WITH ESOPHAGOGASTRODUODENOSCOPY (EGD)     COLONOSCOPY WITH PROPOFOL N/A 06/10/2016   Procedure: COLONOSCOPY WITH PROPOFOL;  Surgeon: Scot Jun, MD;  Location: [facility] ENDOSCOPY;  Service: Endoscopy;  Laterality: N/A;   ESOPHAGOGASTRODUODENOSCOPY (EGD) WITH PROPOFOL  N/A 06/10/2016   Procedure: ESOPHAGOGASTRODUODENOSCOPY (EGD) WITH PROPOFOL;  Surgeon: Scot Jun, MD;  Location: [facility] ENDOSCOPY;  Service: Endoscopy;  Laterality: N/A;   FOOT SURGERY  02/07/1003   right foot benign tumor   REVERSE SHOULDER ARTHROPLASTY Left 03/04/2023   Procedure: REVERSE SHOULDER ARTHROPLASTY;  Surgeon: Christena Flake, MD;  Location: [facility] ORS;  Service: Orthopedics;  Laterality: Left;   SACROILIAC JOINT INJECTION  08/17/13 & 04/13/14   Dr. Yves Dill   SIGMOIDOSCOPY     UTERINE FIBROID SURGERY  1997   Family History  Problem Relation Age of Onset   Breast cancer Mother        29/74   Heart disease Father    Arthritis Father    GER disease Father    Diabetes Brother    Diabetes Maternal Uncle    Colon cancer Neg Hx    Social History   Socioeconomic History   Marital status: Significant Other    Spouse name: [name]   Number of children: 0   Years of education: Not on file   Highest education level: Not on file  Occupational History   Not on file  Tobacco Use   Smoking status: Never   Smokeless tobacco: Never  Vaping Use   Vaping status: Never Used  Substance and Sexual Activity   Alcohol use: Yes    Alcohol/week: 1.0 standard drink of alcohol    Types: 1 Glasses  of wine per week    Comment: rare   Drug use: No   Sexual activity: Yes    Birth control/protection: Surgical    Comment: Hysterectomy  Other Topics Concern   Not on file  Social History Narrative   [name] - POA   Social Drivers of Health   Financial Resource Strain: Low Risk  (01/22/2023)   Overall Financial Resource Strain (CARDIA)    Difficulty of Paying Living Expenses: Not hard at all  Food Insecurity: No Food Insecurity (01/22/2023)   Hunger Vital Sign    Worried About Running Out of Food in the Last Year: Never true    Ran Out of Food in the Last Year: Never true  Transportation Needs: No Transportation Needs (01/22/2023)   PRAPARE - Scientist, research (physical sciences) (Medical): No    Lack of Transportation (Non-Medical): No  Physical Activity: Sufficiently Active (01/22/2023)   Exercise Vital Sign    Days of Exercise per Week: 5 days    Minutes of Exercise per Session: 40 min  Stress: Stress Concern Present (01/22/2023)   [employer] of Occupational Health - Occupational Stress Questionnaire    Feeling of Stress : To some extent  Social Connections: Moderately Integrated (01/22/2023)   Social Connection and Isolation Panel [NHANES]    Frequency of Communication with Friends and Family: More than three times a week    Frequency of Social Gatherings with Friends and Family: More than three times a week    Attends Religious Services: More than 4 times per year    Active Member of [employer] or Organizations: No    Attends Banker Meetings: Never    Marital Status: Living with partner     Review of Systems  Constitutional:  Negative for appetite change and unexpected weight change.  HENT:  Negative for congestion and sinus pressure.   Respiratory:  Negative for cough, chest tightness and shortness of breath.   Cardiovascular:  Negative for chest pain, palpitations and leg swelling.  Gastrointestinal:  Negative for abdominal pain, diarrhea, nausea and vomiting.  Genitourinary:  Negative for difficulty urinating and dysuria.  Musculoskeletal:  Negative for joint swelling and myalgias.       Pain - tail bone and right leg as outlined. Therapy for shoulder.   Skin:  Negative for color change and rash.  Neurological:  Negative for dizziness and headaches.  Psychiatric/Behavioral:  Negative for agitation and dysphoric mood.        Objective:     BP 120/70   Pulse 65   Temp (!) 97.4 F (36.3 C) (Oral)   Ht 5\' 1"  (1.549 m)   Wt 128 lb 9.6 oz (58.3 kg)   LMP 02/26/2002   SpO2 97%   BMI 24.30 kg/m  Wt Readings from Last 3 Encounters:  06/16/23 128 lb 9.6 oz (58.3 kg)  05/06/23 125 lb (56.7 kg)  05/02/23 128 lb 6.4  oz (58.2 kg)    Physical Exam Vitals reviewed.  Constitutional:      General: She is not in acute distress.    Appearance: Normal appearance.  HENT:     Head: Normocephalic and atraumatic.     Right Ear: External ear normal.     Left Ear: External ear normal.     Mouth/Throat:     Pharynx: No oropharyngeal exudate or posterior oropharyngeal erythema.  Eyes:     General: No scleral icterus.       Right eye: No discharge.  Left eye: No discharge.     Conjunctiva/sclera: Conjunctivae normal.  Neck:     Thyroid: No thyromegaly.  Cardiovascular:     Rate and Rhythm: Normal rate and regular rhythm.  Pulmonary:     Effort: No respiratory distress.     Breath sounds: Normal breath sounds. No wheezing.  Abdominal:     General: Bowel sounds are normal.     Palpations: Abdomen is soft.     Tenderness: There is no abdominal tenderness.  Musculoskeletal:        General: No swelling or tenderness.     Cervical back: Neck supple. No tenderness.     Comments: Pain - coccyx.   Lymphadenopathy:     Cervical: No cervical adenopathy.  Skin:    Findings: No erythema or rash.  Neurological:     Mental Status: She is alert.  Psychiatric:        Mood and Affect: Mood normal.        Behavior: Behavior normal.         Outpatient Encounter Medications as of 06/16/2023  Medication Sig   ALPRAZolam (XANAX) 0.25 MG tablet Take 1 tablet (0.25 mg total) by mouth daily as needed.   Apremilast (OTEZLA) 30 MG TABS Take 1 tablet (30 mg total) by mouth daily.   B Complex-C (B-COMPLEX WITH VITAMIN C) tablet Take 1 tablet by mouth daily.   Biotin-Vitamin C (HAIR SKIN NAILS GUMMIES PO) Take 2 tablets by mouth daily. Olly   cholecalciferol (VITAMIN D3) 25 MCG (1000 UNIT) tablet Take 1,000 Units by mouth daily.   Crisaborole (EUCRISA) 2 % OINT Apply 1 application topically 2 (two) times daily as needed (psoriasis).   Cyanocobalamin (VITAMIN B-12) 3000 MCG SUBL Place 6,000 mcg under the tongue  daily at 2 am. Gummy   desonide (DESOWEN) 0.05 % cream Apply 1 application topically 2 (two) times daily as needed (psoriasis).   estradiol (ESTRACE VAGINAL) 0.1 MG/GM vaginal cream Insert 1 gram vaginally 2 times weekly as maintenance   gabapentin (NEURONTIN) 300 MG capsule Take 300 mg by mouth as directed. Two caps in the AM and PM. 1 capsule at lunch.   ibuprofen (ADVIL) 200 MG tablet Take 600 mg by mouth every 6 (six) hours as needed for headache.   Multiple Vitamins-Minerals (MULTIVITAMIN WITH MINERALS) tablet Take 1 tablet by mouth daily. Olly   mupirocin ointment (BACTROBAN) 2 % Apply 1 Application topically daily as needed (Wound).   OVER THE COUNTER MEDICATION Take 2 tablets by mouth daily.  Olly Heavenly Hair  gummy   RABEprazole (ACIPHEX) 20 MG tablet Take 1 tablet (20 mg total) by mouth 2 (two) times daily.   silver sulfADIAZINE (SILVADENE) 1 % cream Apply 1 Application topically daily.   triamcinolone cream (KENALOG) 0.1 % Apply 1 Application topically 2 (two) times daily as needed. For up to 2 weeks. Avoid applying to face, groin, and axilla. Use as directed. Long-term use can cause thinning of the skin.   Wheat Dextrin (BENEFIBER) CHEW Chew 2 tablets by mouth every morning. Gummy   [DISCONTINUED] oxyCODONE (ROXICODONE) 5 MG immediate release tablet Take 1-2 tablets (5-10 mg total) by mouth every 4 (four) hours as needed for moderate pain (pain score 4-6) or severe pain (pain score 7-10). (Patient not taking: Reported on 06/16/2023)   No facility-administered encounter medications on file as of 06/16/2023.     Lab Results  Component Value Date   WBC 5.3 02/25/2023   HGB 13.6 02/25/2023   HCT 40.4  02/25/2023   PLT 297 02/25/2023   GLUCOSE 97 05/01/2023   CHOL 178 05/01/2023   TRIG 67.0 05/01/2023   HDL 68.90 05/01/2023   LDLCALC 96 05/01/2023   ALT 15 05/01/2023   AST 17 05/01/2023   NA 143 05/01/2023   K 4.5 05/01/2023   CL 106 05/01/2023   CREATININE 0.85 05/01/2023    BUN 13 05/01/2023   CO2 29 05/01/2023   TSH 1.08 05/01/2023   HGBA1C 5.9 05/01/2023      Assessment & Plan:  Vitamin D deficiency Assessment & Plan: Continue vitamin D supplements. Check vitamin d level with next labs.    Stress Assessment & Plan: Overall appears to be handling things relatively well.    Psoriatic arthritis (HCC) Assessment & Plan: Followed by dermatology.  Continue Otezla.   LGSIL Pap smear of vagina Assessment & Plan: Saw gyn 04/23/22 - PAP - ok.  Recommended repeat in 2025.  Will need to confirm follow-up appointment scheduled.   Hyperglycemia Assessment & Plan: Lab Results  Component Value Date   HGBA1C 5.9 05/01/2023     Low low-carb diet and exercise.  Follow Mcvey and A1c.   Gastroesophageal reflux disease without esophagitis Assessment & Plan: Recently evaluated by GI 01/29/23 - diarrhea resolved. Due colonoscopy 2028. Remains on aciphex - which controls acid reflux.     Coccyx pain Assessment & Plan: Pain - coccyx s/p falls as outlined. Saw ortho. Gel cushion. Follow.    Lumbar radiculopathy Assessment & Plan: Seeing ortho.  S/p ESI.  Referred to PT and [facility]. Evaluated by Dr Myer Haff 04/23/22 - referred for back pain and leg pain.  Was feeling better at that appt.  Has had PT and ESI's.  Anterolisthesis of L4 and L5 that could cause compression of the right L4 nerve root.  S/p ESI 07/02/22 - Dr Yves Dill. Has been seeing Dr Myer Haff - f/u right leg pain most c/w L5 distribution pain.  Has been doing therapy.  Recent ortho evaluation reported right leg pain as outlined. On gabapentin. Consider epidural injection if persistent problem.       Dale Annandale, MD

## 2023-06-19 DIAGNOSIS — Z96612 Presence of left artificial shoulder joint: Secondary | ICD-10-CM | POA: Diagnosis not present

## 2023-06-22 ENCOUNTER — Encounter: Payer: Self-pay | Admitting: Internal Medicine

## 2023-06-22 DIAGNOSIS — M533 Sacrococcygeal disorders, not elsewhere classified: Secondary | ICD-10-CM | POA: Insufficient documentation

## 2023-06-22 NOTE — Assessment & Plan Note (Signed)
 Pain - coccyx s/p falls as outlined. Saw ortho. Gel cushion. Follow.

## 2023-06-22 NOTE — Assessment & Plan Note (Signed)
 Lab Results  Component Value Date   HGBA1C 5.9 05/01/2023     Low low-carb diet and exercise.  Follow Mcvey and A1c.

## 2023-06-22 NOTE — Assessment & Plan Note (Signed)
 Saw gyn 04/23/22 - PAP - ok.  Recommended repeat in 2025.  Will need to confirm follow-up appointment scheduled.

## 2023-06-22 NOTE — Assessment & Plan Note (Signed)
 Recently evaluated by GI 01/29/23 - diarrhea resolved. Due colonoscopy 2028. Remains on aciphex - which controls acid reflux.

## 2023-06-22 NOTE — Assessment & Plan Note (Signed)
 Seeing ortho.  S/p ESI.  Referred to PT and NSU. Evaluated by Dr Myer Haff 04/23/22 - referred for back pain and leg pain.  Was feeling better at that appt.  Has had PT and ESI's.  Anterolisthesis of L4 and L5 that could cause compression of the right L4 nerve root.  S/p ESI 07/02/22 - Dr Yves Dill. Has been seeing Dr Myer Haff - f/u right leg pain most c/w L5 distribution pain.  Has been doing therapy.  Recent ortho evaluation reported right leg pain as outlined. On gabapentin. Consider epidural injection if persistent problem.

## 2023-06-22 NOTE — Assessment & Plan Note (Signed)
Overall appears to be handling things relatively well. 

## 2023-06-22 NOTE — Assessment & Plan Note (Signed)
 Continue vitamin D supplements. Check vitamin d level with next labs.

## 2023-06-22 NOTE — Assessment & Plan Note (Addendum)
 Followed by dermatology.  Continue Otezla.

## 2023-06-24 DIAGNOSIS — Z96612 Presence of left artificial shoulder joint: Secondary | ICD-10-CM | POA: Diagnosis not present

## 2023-06-27 DIAGNOSIS — Z96612 Presence of left artificial shoulder joint: Secondary | ICD-10-CM | POA: Diagnosis not present

## 2023-07-02 ENCOUNTER — Ambulatory Visit: Admitting: Family

## 2023-07-02 ENCOUNTER — Encounter: Payer: Self-pay | Admitting: Family

## 2023-07-02 VITALS — BP 122/70 | HR 70 | Temp 98.1°F | Ht 61.0 in | Wt 128.1 lb

## 2023-07-02 DIAGNOSIS — J40 Bronchitis, not specified as acute or chronic: Secondary | ICD-10-CM | POA: Diagnosis not present

## 2023-07-02 MED ORDER — GUAIFENESIN-CODEINE 100-10 MG/5ML PO SOLN: 5.0000 mL | Freq: Every evening | ORAL | 0 refills | Status: DC | PRN

## 2023-07-02 NOTE — Progress Notes (Unsigned)
 Assessment & Plan:  There are no diagnoses linked to this encounter.   Return precautions given.   Risks, benefits, and alternatives of the medications and treatment plan prescribed today were discussed, and patient expressed understanding.   Education regarding symptom management and diagnosis given to patient on AVS either electronically or printed.  No follow-ups on file.  Rennie Plowman, FNP  Subjective:    Patient ID: April Santos, female    DOB: 17-Jan-1956, 68 y.o.   MRN: [id number]  CC: April Santos is a 68 y.o. female who presents today for an acute visit.    HPI: Complains of sinus pressure, cough x 6  days  Cough worse at night.  Symptoms started with sore throat  Sinus pressure has resolved 3 days ago.  She is most bothered byt nght time cough  She started delsym, robtisusin, advil cold and sinus, mucinex with some relief  No fever, sob, cp, ear pain She has been on codeine syrups    No h/o lung disease Non smoker No CKD  Allergies: Cefdinir and Erythromycin ethylsuccinate [erythromycin] Current Outpatient Medications on File Prior to Visit  Medication Sig Dispense Refill   ALPRAZolam (XANAX) 0.25 MG tablet Take 1 tablet (0.25 mg total) by mouth daily as needed. 15 tablet 0   Apremilast (OTEZLA) 30 MG TABS Take 1 tablet (30 mg total) by mouth daily. 30 tablet 5   B Complex-C (B-COMPLEX WITH VITAMIN C) tablet Take 1 tablet by mouth daily.     Biotin-Vitamin C (HAIR SKIN NAILS GUMMIES PO) Take 2 tablets by mouth daily. Olly     cholecalciferol (VITAMIN D3) 25 MCG (1000 UNIT) tablet Take 1,000 Units by mouth daily.     Crisaborole (EUCRISA) 2 % OINT Apply 1 application topically 2 (two) times daily as needed (psoriasis). 60 g 1   Cyanocobalamin (VITAMIN B-12) 3000 MCG SUBL Place 6,000 mcg under the tongue daily at 2 am. Gummy     desonide (DESOWEN) 0.05 % cream Apply 1 application topically 2 (two) times daily as needed (psoriasis).     estradiol  (ESTRACE VAGINAL) 0.1 MG/GM vaginal cream Insert 1 gram vaginally 2 times weekly as maintenance 42.5 g 2   gabapentin (NEURONTIN) 300 MG capsule Take 300 mg by mouth as directed. Two caps in the AM and PM. 1 capsule at lunch.     ibuprofen (ADVIL) 200 MG tablet Take 600 mg by mouth every 6 (six) hours as needed for headache.     Multiple Vitamins-Minerals (MULTIVITAMIN WITH MINERALS) tablet Take 1 tablet by mouth daily. Olly     mupirocin ointment (BACTROBAN) 2 % Apply 1 Application topically daily as needed (Wound).     OVER THE COUNTER MEDICATION Take 2 tablets by mouth daily.  Olly Heavenly Hair  gummy     RABEprazole (ACIPHEX) 20 MG tablet Take 1 tablet (20 mg total) by mouth 2 (two) times daily.     silver sulfADIAZINE (SILVADENE) 1 % cream Apply 1 Application topically daily.     triamcinolone cream (KENALOG) 0.1 % Apply 1 Application topically 2 (two) times daily as needed. For up to 2 weeks. Avoid applying to face, groin, and axilla. Use as directed. Long-term use can cause thinning of the skin. 80 g 1   Wheat Dextrin (BENEFIBER) CHEW Chew 2 tablets by mouth every morning. Gummy     No current facility-administered medications on file prior to visit.    Review of Systems    Objective:  BP 122/70   Pulse 70   Temp 98.1 F (36.7 C) (Oral)   Ht 5\' 1"  (1.549 m)   Wt 128 lb 1.6 oz (58.1 kg)   LMP 02/26/2002   SpO2 98%   BMI 24.20 kg/m   BP Readings from Last 3 Encounters:  07/02/23 122/70  06/16/23 120/70  05/06/23 122/71   Wt Readings from Last 3 Encounters:  07/02/23 128 lb 1.6 oz (58.1 kg)  06/16/23 128 lb 9.6 oz (58.3 kg)  05/06/23 125 lb (56.7 kg)    Physical Exam

## 2023-07-04 DIAGNOSIS — M25512 Pain in left shoulder: Secondary | ICD-10-CM | POA: Diagnosis not present

## 2023-07-04 DIAGNOSIS — M25412 Effusion, left shoulder: Secondary | ICD-10-CM | POA: Diagnosis not present

## 2023-07-04 NOTE — Assessment & Plan Note (Signed)
 Reassuring exam.  Duration 6 days.  Discussed patient more likely viral etiology however she may also be developing early bacterial URI.  She is comfortable with starting with codeine-based syrup.  She will let me know if symptoms do not improve and at that point I will send an antibiotic.  Continue Mucinex.

## 2023-07-04 NOTE — Patient Instructions (Addendum)
 Start guaifenesin with codeine syrup at bedtime.  During the day you may continue Mucinex.  Please let me know if symptoms do not resolve as at that point I would recommend an antibiotic.  Please take cough medication at night only as needed. As we discussed, I do not recommend dosing throughout the day as coughing is a protective mechanism . It also helps to break up thick mucous.  Do not take cough suppressants with alcohol as can lead to trouble breathing. Advise caution if taking cough suppressant and operating machinery ( i.e driving a car) as you may feel very tired.  It is imperative that you store this medication away from children, pets.  Please store this in a safe place so that no one else is able to use

## 2023-07-07 ENCOUNTER — Encounter: Payer: Self-pay | Admitting: Family

## 2023-07-08 ENCOUNTER — Other Ambulatory Visit: Payer: Self-pay | Admitting: Family

## 2023-07-08 DIAGNOSIS — J40 Bronchitis, not specified as acute or chronic: Secondary | ICD-10-CM

## 2023-07-08 DIAGNOSIS — M5416 Radiculopathy, lumbar region: Secondary | ICD-10-CM | POA: Diagnosis not present

## 2023-07-08 DIAGNOSIS — M48062 Spinal stenosis, lumbar region with neurogenic claudication: Secondary | ICD-10-CM | POA: Diagnosis not present

## 2023-07-08 MED ORDER — DOXYCYCLINE HYCLATE 100 MG PO TABS: 100.0000 mg | ORAL_TABLET | Freq: Two times a day (BID) | ORAL | 0 refills | Status: AC

## 2023-07-22 DIAGNOSIS — M25512 Pain in left shoulder: Secondary | ICD-10-CM | POA: Diagnosis not present

## 2023-07-22 DIAGNOSIS — M25412 Effusion, left shoulder: Secondary | ICD-10-CM | POA: Diagnosis not present

## 2023-07-31 DIAGNOSIS — M25512 Pain in left shoulder: Secondary | ICD-10-CM | POA: Diagnosis not present

## 2023-07-31 DIAGNOSIS — M25412 Effusion, left shoulder: Secondary | ICD-10-CM | POA: Diagnosis not present

## 2023-08-13 DIAGNOSIS — M5416 Radiculopathy, lumbar region: Secondary | ICD-10-CM | POA: Diagnosis not present

## 2023-08-13 DIAGNOSIS — M47816 Spondylosis without myelopathy or radiculopathy, lumbar region: Secondary | ICD-10-CM | POA: Diagnosis not present

## 2023-08-13 DIAGNOSIS — M51362 Other intervertebral disc degeneration, lumbar region with discogenic back pain and lower extremity pain: Secondary | ICD-10-CM | POA: Diagnosis not present

## 2023-08-18 DIAGNOSIS — M5416 Radiculopathy, lumbar region: Secondary | ICD-10-CM | POA: Diagnosis not present

## 2023-08-25 DIAGNOSIS — M5416 Radiculopathy, lumbar region: Secondary | ICD-10-CM | POA: Diagnosis not present

## 2023-08-26 ENCOUNTER — Encounter (INDEPENDENT_AMBULATORY_CARE_PROVIDER_SITE_OTHER): Payer: Self-pay

## 2023-09-02 DIAGNOSIS — M5416 Radiculopathy, lumbar region: Secondary | ICD-10-CM | POA: Diagnosis not present

## 2023-09-03 DIAGNOSIS — Z96612 Presence of left artificial shoulder joint: Secondary | ICD-10-CM | POA: Diagnosis not present

## 2023-09-03 DIAGNOSIS — M7582 Other shoulder lesions, left shoulder: Secondary | ICD-10-CM | POA: Diagnosis not present

## 2023-09-03 DIAGNOSIS — M75122 Complete rotator cuff tear or rupture of left shoulder, not specified as traumatic: Secondary | ICD-10-CM | POA: Diagnosis not present

## 2023-09-08 DIAGNOSIS — M5416 Radiculopathy, lumbar region: Secondary | ICD-10-CM | POA: Diagnosis not present

## 2023-09-09 ENCOUNTER — Ambulatory Visit
Admission: RE | Admit: 2023-09-09 | Discharge: 2023-09-09 | Disposition: A | Discharge status: Home / Self Care | Source: Ambulatory Visit | Attending: Student in an Organized Health Care Education/Training Program | Admitting: Student in an Organized Health Care Education/Training Program

## 2023-09-09 ENCOUNTER — Encounter: Payer: Self-pay | Admitting: Student in an Organized Health Care Education/Training Program

## 2023-09-09 ENCOUNTER — Ambulatory Visit: Admitting: Student in an Organized Health Care Education/Training Program

## 2023-09-09 VITALS — BP 139/86 | HR 77 | Temp 97.2°F | Resp 18 | Ht 61.0 in | Wt 128.0 lb

## 2023-09-09 DIAGNOSIS — G8929 Other chronic pain: Secondary | ICD-10-CM | POA: Insufficient documentation

## 2023-09-09 DIAGNOSIS — M47816 Spondylosis without myelopathy or radiculopathy, lumbar region: Secondary | ICD-10-CM

## 2023-09-09 DIAGNOSIS — M5416 Radiculopathy, lumbar region: Secondary | ICD-10-CM | POA: Insufficient documentation

## 2023-09-09 DIAGNOSIS — M48061 Spinal stenosis, lumbar region without neurogenic claudication: Secondary | ICD-10-CM

## 2023-09-09 DIAGNOSIS — M4316 Spondylolisthesis, lumbar region: Secondary | ICD-10-CM | POA: Diagnosis not present

## 2023-09-09 DIAGNOSIS — M419 Scoliosis, unspecified: Secondary | ICD-10-CM | POA: Diagnosis not present

## 2023-09-09 NOTE — Progress Notes (Signed)
 PROVIDER NOTE: Interpretation of information contained herein should be left to medically-trained personnel. Specific patient instructions are provided elsewhere under "Patient Instructions" section of medical record. This document was created in part using AI and STT-dictation technology, any transcriptional errors that may result from this process are unintentional.  Patient: April Santos  Service: E/M Encounter  Provider: Cephus Collin, MD  DOB: 1956/01/14  Delivery: Face-to-face  Specialty: Interventional Pain Management  MRN: [id number]  Setting: Ambulatory outpatient facility  Specialty designation: 09  Type: New Patient  Location: Outpatient office facility  PCP: Dellar Fenton, MD  DOS: 09/09/2023    Referring Prov.: Coralyn Derry, PA-C   Primary Reason(s) for Visit: Encounter for initial evaluation of one or more chronic problems (new to examiner) potentially causing chronic pain, and posing a threat to normal musculoskeletal function. (Level of risk: High) CC: Back Pain (lower)  HPI  April Santos is a 68 y.o. year old, female patient, who comes for the first time to our practice referred by Coralyn Derry, PA-C for our initial evaluation of her chronic pain. She has Environmental allergies; History of condyloma acuminatum; GERD (gastroesophageal reflux disease); Carpal tunnel syndrome; Osteopenia; Abnormal mammogram; Toenail fungus; Right knee pain; Stress; Leg pain, left; Health care maintenance; Current use of estrogen therapy; Low iron; Skin lesion; Left shoulder pain; Psoriatic arthritis (HCC); Right hand pain; Vitamin D  deficiency; Generalized osteoarthritis of hand; HNP (herniated nucleus pulposus), lumbar; Chronic radicular lumbar pain; Osteoarthritis of knee; Sacroiliitis (HCC); Breast cancer screening; Hyperglycemia; History of COVID-19; Arthritis of carpometacarpal (CMC) joint of right thumb; Post-menopausal atrophic vaginitis; LGSIL Pap smear of vagina; Family history of breast  cancer; Right hip pain; Varicose veins of leg with swelling, bilateral; Right ankle pain; Hand pain, left; ASCUS of cervix with negative high risk HPV; Diarrhea; Dysphagia; Rotator cuff tear; Urticaria; History of vaginal dysplasia; Coccyx pain; Bronchitis; Lumbar facet arthropathy; and Neuroforaminal stenosis of lumbar spine on their problem list. Today she comes in for evaluation of her Back Pain (lower)  Pain Assessment: Location:   Back Radiating: lateral and back of right leg to the foot Onset: More than a month ago Duration: Chronic pain Quality: Aching, Headache, Tiring, Throbbing Severity: 5 /10 (subjective, self-reported pain score)  Effect on ADL:   Timing: Intermittent Modifying factors: Gabapentin , bending, stretching, lying down, walking, heat, PT BP: 139/86  HR: 77  Onset and Duration: Gradual and Present longer than 3 months Cause of pain: Unknown Severity: Getting worse, NAS-11 at its worse: 9/10, NAS-11 at its best: 2/10, NAS-11 now: 6/10, and NAS-11 on the average: 6/10 Timing: Morning, After activity or exercise, and After a period of immobility Aggravating Factors: Lifiting, Motion, Prolonged sitting, Squatting, Stooping , and Working Alleviating Factors: Bending, Stretching, Lying down, Medications, Resting, Sleeping, TENS, Warm showers or baths, and Walking Associated Problems: Inability to concentrate and Tingling Quality of Pain: Aching, Annoying, Burning, Intermittent, Exhausting, Feeling of weight, Itching, Pulsating, Throbbing, Tiring, and Uncomfortable Previous Examinations or Tests: MRI scan, X-rays, Neurosurgical evaluation, and Orthopedic evaluation Previous Treatments: Epidural steroid injections, Physical Therapy, Steroid treatments by mouth, Stretching exercises, and TENS  Ms. Bernardi is being evaluated for possible interventional pain management therapies for the treatment of her chronic pain.  Discussed the use of AI scribe software for clinical note  transcription with the patient, who gave verbal consent to proceed.  History of Present Illness   April Santos is a 68 year old female with chronic low back pain who presents with worsening pain  radiating down her right leg after a fall.  She has chronic low back pain that radiates down her right leg more than her left. The pain worsened following a fall on March 18, 2023, when she slipped on a wet surface and landed on cement. Since the fall, the pain has been intermittent, with periods of exacerbation, particularly when sitting at her computer desk for work.  She has a history of undergoing physical therapy, which was initially effective, allowing her to resume walking five miles a day. However, after the fall, she resumed physical therapy focusing on her hips two weeks ago. Despite continuing exercises at home and at the hospital, she reports limited relief.  She has received epidural injections for the past eight to nine years, initially once a year, then twice a year. Following the fall, she received an epidural on March 25, 2023, and another on July 08, 2023, but noted that the relief was not sustained after the anesthetic wore off. She has not had spine surgery.  Her pain extends sometimes all the way down her leg to the bottom of her foot, with significant discomfort in the buttock area. She has not been diagnosed with plantar fasciitis. She is unsure if she has had sacroiliac joint injections in the past.  Her current medications include gabapentin , which she takes at a dose of 300 mg in the morning, 300 mg at lunch, and 600 mg at night. She previously used hydrocodone  for severe pain but discontinued it.  She has had x-rays in March 2025 and an MRI in 2024, but no MRI since her fall.       Meds   Current Outpatient Medications:    ALPRAZolam  (XANAX ) 0.25 MG tablet, Take 1 tablet (0.25 mg total) by mouth daily as needed., Disp: 15 tablet, Rfl: 0   B Complex-C (B-COMPLEX WITH  VITAMIN C) tablet, Take 1 tablet by mouth daily., Disp: , Rfl:    Biotin-Vitamin C (HAIR SKIN NAILS GUMMIES PO), Take 2 tablets by mouth daily. Olly, Disp: , Rfl:    cholecalciferol (VITAMIN D3) 25 MCG (1000 UNIT) tablet, Take 1,000 Units by mouth daily., Disp: , Rfl:    Crisaborole  (EUCRISA ) 2 % OINT, Apply 1 application topically 2 (two) times daily as needed (psoriasis)., Disp: 60 g, Rfl: 1   Cyanocobalamin (VITAMIN B-12) 3000 MCG SUBL, Place 6,000 mcg under the tongue daily at 2 am. Gummy, Disp: , Rfl:    desonide  (DESOWEN ) 0.05 % cream, Apply 1 application topically 2 (two) times daily as needed (psoriasis)., Disp: , Rfl:    estradiol  (ESTRACE  VAGINAL) 0.1 MG/GM vaginal cream, Insert 1 gram vaginally 2 times weekly as maintenance, Disp: 42.5 g, Rfl: 2   gabapentin  (NEURONTIN ) 300 MG capsule, Take 300 mg by mouth as directed. One in a.m., one at noon, two at bedtime, Disp: , Rfl:    ibuprofen (ADVIL) 200 MG tablet, Take 600 mg by mouth every 6 (six) hours as needed for headache., Disp: , Rfl:    Multiple Vitamins-Minerals (MULTIVITAMIN WITH MINERALS) tablet, Take 1 tablet by mouth daily. Olly, Disp: , Rfl:    RABEprazole  (ACIPHEX ) 20 MG tablet, Take 1 tablet (20 mg total) by mouth 2 (two) times daily., Disp: , Rfl:    silver  sulfADIAZINE  (SILVADENE ) 1 % cream, Apply 1 Application topically daily., Disp: , Rfl:    Wheat Dextrin (BENEFIBER) CHEW, Chew 2 tablets by mouth every morning. Gummy, Disp: , Rfl:    Apremilast  (OTEZLA ) 30 MG TABS, Take 1 tablet (  30 mg total) by mouth daily., Disp: 30 tablet, Rfl: 5   guaiFENesin -codeine  100-10 MG/5ML syrup, Take 5 mLs by mouth at bedtime as needed for cough. (Patient not taking: Reported on 09/09/2023), Disp: 60 mL, Rfl: 0   mupirocin  ointment (BACTROBAN ) 2 %, Apply 1 Application topically daily as needed (Wound). (Patient not taking: Reported on 09/09/2023), Disp: , Rfl:    OVER THE COUNTER MEDICATION, Take 2 tablets by mouth daily.  Olly Heavenly Hair  gummy  (Patient not taking: Reported on 09/09/2023), Disp: , Rfl:    triamcinolone  cream (KENALOG ) 0.1 %, Apply 1 Application topically 2 (two) times daily as needed. For up to 2 weeks. Avoid applying to face, groin, and axilla. Use as directed. Long-term use can cause thinning of the skin. (Patient not taking: Reported on 09/09/2023), Disp: 80 g, Rfl: 1  Imaging Review  DG Shoulder Left  Narrative CLINICAL DATA:  Acute pain.  No reported injury.  EXAM: LEFT SHOULDER - 2+ VIEW  COMPARISON:  02/05/2016 .  FINDINGS: Acromioclavicular glenohumeral degenerative change. No acute bony or joint abnormality identified. No evidence of fracture or dislocation.  IMPRESSION: Degenerative changes left shoulder.  No acute abnormality.   Electronically Signed By: Andy Bannister  Register On: 06/03/2016 16:59    MR LUMBAR SPINE WO CONTRAST  Narrative CLINICAL DATA:  Chronic low back pain radiating into the right leg with burning in the right thigh. Symptoms have worsened over the past 4-5 months.  EXAM: MRI LUMBAR SPINE WITHOUT CONTRAST  TECHNIQUE: Multiplanar, multisequence MR imaging of the lumbar spine was performed. No intravenous contrast was administered.  COMPARISON:  MRI lumbar spine 10/27/2020.  FINDINGS: Segmentation:  Standard.  Alignment: 0.7 cm facet mediated anterolisthesis L4 on L5 and convex left scoliosis are unchanged.  Vertebrae: No fracture, evidence of discitis, or bone lesion. Scattered, mild degenerative endplate signal change is most notable at L4-5.  Conus medullaris and cauda equina: Conus extends to the L1 level. Conus and cauda equina appear normal.  Paraspinal and other soft tissues: 2 small left renal cysts are unchanged.  Disc levels:  T11-12 is imaged in the sagittal plane only and negative.  T12-L1: Negative.  L1-2: Minimal disc bulge without stenosis, unchanged.  L2-3: There is loss of disc space height with a shallow broad-based bulge. Mild to  moderate foraminal narrowing is worse on the right. No change.  L3-4: Loss of disc space height with a shallow bulge eccentric to the right. Mild to moderate facet arthropathy is present. Mild left and moderately severe right foraminal narrowing. The central canal is open. No change.  L4-5: Advanced facet degenerative change bilaterally. The disc is uncovered and bulging. Severe right foraminal narrowing is worse than on the prior exam. Mild to moderate central canal stenosis also shows some progression. Mild left foraminal narrowing is unchanged.  L5-S1: There is a shallow disc bulge, left worse than right facet degenerative change is present. The central canal and right foramen are open. Mild to moderate left foraminal narrowing is unchanged.  IMPRESSION: 1. Severe right foraminal narrowing at L4-5 is worse than on the prior exam. Mild to moderate central canal stenosis at L4-5 also shows some progression. 2. No change in mild to moderate foraminal narrowing at L2-3 which is worse on the right. 3. No change in moderately severe right foraminal narrowing at L3-4. 4. No change in mild to moderate left foraminal narrowing at L5-S1.   Electronically Signed By: Etheleen Her M.D. On: 08/12/2022 08:30  DG Lumbar Spine  Complete  Narrative CLINICAL DATA:  back and leg pain  EXAM: LUMBAR SPINE - COMPLETE 4+ VIEW  COMPARISON:  MR lumbar spine 10/27/2020  FINDINGS: Slight levoconvex lumbar curvature. There is no evidence of lumbar spine fracture. There is unchanged degenerative grade 1 anterolisthesis at L4-L5. There is multilevel degenerative disc disease, moderate-severe from L2 through S1. There is moderate-severe lower lumbar predominant facet arthropathy.  IMPRESSION: Multilevel degenerative disc disease, moderate-severe from L2 through S1.  Moderate-severe lower lumbar predominant facet arthropathy.  Unchanged degenerative grade 1 anterolisthesis at  L4-L5.   Electronically Signed By: Jacob  Kahn M.D. On: 08/11/2022 14:10   Complexity Note: Imaging results reviewed.                         ROS  Cardiovascular: Needs antibiotics prior to dental procedures Pulmonary or Respiratory: No reported pulmonary signs or symptoms such as wheezing and difficulty taking a deep full breath (Asthma), difficulty blowing air out (Emphysema), coughing up mucus (Bronchitis), persistent dry cough, or temporary stoppage of breathing during sleep Neurological: No reported neurological signs or symptoms such as seizures, abnormal skin sensations, urinary and/or fecal incontinence, being born with an abnormal open spine and/or a tethered spinal cord Psychological-Psychiatric: No reported psychological or psychiatric signs or symptoms such as difficulty sleeping, anxiety, depression, delusions or hallucinations (schizophrenial), mood swings (bipolar disorders) or suicidal ideations or attempts Gastrointestinal: Reflux or heatburn Genitourinary: No reported renal or genitourinary signs or symptoms such as difficulty voiding or producing urine, peeing blood, non-functioning kidney, kidney stones, difficulty emptying the bladder, difficulty controlling the flow of urine, or chronic kidney disease Hematological: No reported hematological signs or symptoms such as prolonged bleeding, low or poor functioning platelets, bruising or bleeding easily, hereditary bleeding problems, low energy levels due to low hemoglobin or being anemic Endocrine: No reported endocrine signs or symptoms such as high or low blood sugar, rapid heart rate due to high thyroid  levels, obesity or weight gain due to slow thyroid  or thyroid  disease Rheumatologic: No reported rheumatological signs and symptoms such as fatigue, joint pain, tenderness, swelling, redness, heat, stiffness, decreased range of motion, with or without associated rash Musculoskeletal: Negative for myasthenia gravis, muscular  dystrophy, multiple sclerosis or malignant hyperthermia Work History: Working part time  Allergies  Ms. Navis is allergic to cefdinir and erythromycin ethylsuccinate [erythromycin].  Laboratory Chemistry Profile   Renal Lab Results  Component Value Date   BUN 13 05/01/2023   CREATININE 0.85 05/01/2023   GFR 70.16 05/01/2023   GFRNONAA >60 02/25/2023   PROTEINUR NEGATIVE 02/25/2023     Electrolytes Lab Results  Component Value Date   NA 143 05/01/2023   K 4.5 05/01/2023   CL 106 05/01/2023   CALCIUM 9.5 05/01/2023     Hepatic Lab Results  Component Value Date   AST 17 05/01/2023   ALT 15 05/01/2023   ALBUMIN 4.2 05/01/2023   ALKPHOS 80 05/01/2023     ID Lab Results  Component Value Date   STAPHAUREUS NEGATIVE 02/25/2023   MRSAPCR NEGATIVE 02/25/2023     Bone Lab Results  Component Value Date   VD25OH 50.93 05/01/2023     Endocrine Lab Results  Component Value Date   GLUCOSE 97 05/01/2023   GLUCOSEU NEGATIVE 02/25/2023   HGBA1C 5.9 05/01/2023   TSH 1.08 05/01/2023     Neuropathy Lab Results  Component Value Date   VITAMINB12 324 06/23/2017   HGBA1C 5.9 05/01/2023     CNS No  results found for: "COLORCSF", "APPEARCSF", "RBCCOUNTCSF", "WBCCSF", "POLYSCSF", "LYMPHSCSF", "EOSCSF", "PROTEINCSF", "GLUCCSF", "JCVIRUS", "CSFOLI", "IGGCSF", "LABACHR", "ACETBL"   Inflammation (CRP: Acute  ESR: Chronic) Lab Results  Component Value Date   CRP 0.2 (L) 06/03/2016   ESRSEDRATE 3 06/03/2016     Rheumatology Lab Results  Component Value Date   RF <14 06/03/2016   ANA NEG 06/03/2016     Coagulation Lab Results  Component Value Date   PLT 297 02/25/2023     Cardiovascular Lab Results  Component Value Date   HGB 13.6 02/25/2023   HCT 40.4 02/25/2023     Screening Lab Results  Component Value Date   STAPHAUREUS NEGATIVE 02/25/2023   MRSAPCR NEGATIVE 02/25/2023     Cancer No results found for: "CEA", "CA125", "LABCA2"   Allergens No  results found for: "ALMOND", "APPLE", "ASPARAGUS", "AVOCADO", "BANANA", "BARLEY", "BASIL", "BAYLEAF", "GREENBEAN", "LIMABEAN", "WHITEBEAN", "BEEFIGE", "REDBEET", "BLUEBERRY", "BROCCOLI", "CABBAGE", "MELON", "CARROT", "CASEIN", "CASHEWNUT", "CAULIFLOWER", "CELERY"     Note: Lab results reviewed.  PFSH  Drug: Ms. Torelli  reports no history of drug use. Alcohol:  reports current alcohol use of about 1.0 standard drink of alcohol per week. Tobacco:  reports that she has never smoked. She has never used smokeless tobacco. Medical:  has a past medical history of Arthritis, Colon polyps, COVID-19 (2022), Disc disorder, Dysphagia, Family history of adverse reaction to anesthesia, GERD (gastroesophageal reflux disease), History of uterine fibroid, and Psoriasis. Family: family history includes Arthritis in her father; Breast cancer in her mother; Diabetes in her brother and maternal uncle; GER disease in her father; Heart disease in her father.  Past Surgical History:  Procedure Laterality Date   ABDOMINAL HYSTERECTOMY     BREAST SURGERY  1999   biopsy   carpal  2005   Tunnel right hand   CARPOMETACARPAL (CMC) FUSION OF THUMB Right 11/16/2020   Procedure: Right CARPOMETACARPAL [facility]) ARTHROPLASTY;  Surgeon: Molli Angelucci, MD;  Location: [facility] ORS;  Service: Orthopedics;  Laterality: Right;   COLONOSCOPY WITH ESOPHAGOGASTRODUODENOSCOPY (EGD)     COLONOSCOPY WITH PROPOFOL  N/A 06/10/2016   Procedure: COLONOSCOPY WITH PROPOFOL ;  Surgeon: Cassie Click, MD;  Location: [facility] ENDOSCOPY;  Service: Endoscopy;  Laterality: N/A;   ESOPHAGOGASTRODUODENOSCOPY (EGD) WITH PROPOFOL  N/A 06/10/2016   Procedure: ESOPHAGOGASTRODUODENOSCOPY (EGD) WITH PROPOFOL ;  Surgeon: Cassie Click, MD;  Location: [facility] ENDOSCOPY;  Service: Endoscopy;  Laterality: N/A;   FOOT SURGERY  02/07/1003   right foot benign tumor   REVERSE SHOULDER ARTHROPLASTY Left 03/04/2023   Procedure: REVERSE SHOULDER ARTHROPLASTY;  Surgeon: Elner Hahn, MD;  Location: [facility] ORS;  Service: Orthopedics;  Laterality: Left;   SACROILIAC JOINT INJECTION  08/17/13 & 04/13/14   Dr. Erman Hayward   SIGMOIDOSCOPY     UTERINE FIBROID SURGERY  1997   Active Ambulatory Problems    Diagnosis Date Noted   Environmental allergies 05/03/2012   History of condyloma acuminatum 05/03/2012   GERD (gastroesophageal reflux disease) 05/03/2012   Carpal tunnel syndrome 11/26/2012   Osteopenia 05/14/2013   Abnormal mammogram 06/12/2013   Toenail fungus 06/12/2013   Right knee pain 07/20/2013   Stress 10/17/2013   Leg pain, left 05/16/2014   Health care maintenance 05/16/2014   Current use of estrogen therapy 11/29/2014   Low iron 06/05/2015   Skin lesion 06/05/2015   Left shoulder pain 02/05/2016   Psoriatic arthritis (HCC) 02/11/2016   Right hand pain 03/11/2016   Vitamin D  deficiency 08/02/2018   Generalized osteoarthritis of hand 09/04/2017   HNP (herniated  nucleus pulposus), lumbar 10/20/2014   Chronic radicular lumbar pain 10/20/2014   Osteoarthritis of knee 09/15/2013   Sacroiliitis (HCC) 08/17/2013   Breast cancer screening 01/30/2020   Hyperglycemia 08/22/2020   History of COVID-19 10/31/2020   Arthritis of carpometacarpal [facility]) joint of right thumb 11/26/2020   Post-menopausal atrophic vaginitis 04/19/2021   LGSIL Pap smear of vagina 04/19/2021   Family history of breast cancer 04/19/2021   Right hip pain 07/07/2021   Varicose veins of leg with swelling, bilateral 07/07/2021   Right ankle pain 11/08/2021   Hand pain, left 03/13/2022   ASCUS of cervix with negative high risk HPV 04/22/2022   Diarrhea 09/22/2022   Dysphagia 09/22/2022   Rotator cuff tear 01/10/2023   Urticaria 05/02/2023   History of vaginal dysplasia 05/05/2023   Coccyx pain 06/22/2023   Bronchitis 07/02/2023   Lumbar facet arthropathy 09/09/2023   Neuroforaminal stenosis of lumbar spine 09/09/2023   Resolved Ambulatory Problems    Diagnosis Date Noted   Viral upper  respiratory infection 06/01/2015   Abdominal pain 08/01/2019   Past Medical History:  Diagnosis Date   Arthritis    Colon polyps    COVID-19 2022   Disc disorder    Family history of adverse reaction to anesthesia    History of uterine fibroid    Psoriasis    Constitutional Exam  General appearance: Well nourished, well developed, and well hydrated. In no apparent acute distress Vitals:   09/09/23 0828  BP: 139/86  Pulse: 77  Resp: 18  Temp: (!) 97.2 F (36.2 C)  TempSrc: Temporal  SpO2: 100%  Weight: 128 lb (58.1 kg)  Height: 5\' 1"  (1.549 m)   BMI Assessment: Estimated body mass index is 24.19 kg/m as calculated from the following:   Height as of this encounter: 5\' 1"  (1.549 m).   Weight as of this encounter: 128 lb (58.1 kg).  BMI interpretation table: BMI level Category Range association with higher incidence of chronic pain  <18 kg/m2 Underweight   18.5-24.9 kg/m2 Ideal body weight   25-29.9 kg/m2 Overweight Increased incidence by 20%  30-34.9 kg/m2 Obese (Class I) Increased incidence by 68%  35-39.9 kg/m2 Severe obesity (Class II) Increased incidence by 136%  >40 kg/m2 Extreme obesity (Class III) Increased incidence by 254%   Patient's current BMI Ideal Body weight  Body mass index is 24.19 kg/m. Ideal body weight: 47.8 kg (105 lb 6.1 oz) Adjusted ideal body weight: 51.9 kg (114 lb 6.8 oz)   BMI Readings from Last 4 Encounters:  09/09/23 24.19 kg/m  07/02/23 24.20 kg/m  06/16/23 24.30 kg/m  05/06/23 23.62 kg/m   Wt Readings from Last 4 Encounters:  09/09/23 128 lb (58.1 kg)  07/02/23 128 lb 1.6 oz (58.1 kg)  06/16/23 128 lb 9.6 oz (58.3 kg)  05/06/23 125 lb (56.7 kg)    Psych/Mental status: Alert, oriented x 3 (person, place, & time)       Eyes: PERLA Respiratory: No evidence of acute respiratory distress  Lumbar Spine Area Exam  Skin & Axial Inspection: No masses, redness, or swelling Alignment: Symmetrical Functional ROM: Pain restricted  ROM       Stability: No instability detected Muscle Tone/Strength: Functionally intact. No obvious neuro-muscular anomalies detected. Sensory (Neurological): Dermatomal pain pattern right L3/4 Palpation: No palpable anomalies       Provocative Tests: Hyperextension/rotation test: (+) due to pain. Lumbar quadrant test (Kemp's test): (+) on the right for foraminal stenosis  Gait & Posture Assessment  Ambulation:  Unassisted Gait: Relatively normal for age and body habitus Posture: WNL  Lower Extremity Exam    Side: Right lower extremity  Side: Left lower extremity  Stability: No instability observed          Stability: No instability observed          Skin & Extremity Inspection: Skin color, temperature, and hair growth are WNL. No peripheral edema or cyanosis. No masses, redness, swelling, asymmetry, or associated skin lesions. No contractures.  Skin & Extremity Inspection: Skin color, temperature, and hair growth are WNL. No peripheral edema or cyanosis. No masses, redness, swelling, asymmetry, or associated skin lesions. No contractures.  Functional ROM: Unrestricted ROM                  Functional ROM: Unrestricted ROM                  Muscle Tone/Strength: Functionally intact. No obvious neuro-muscular anomalies detected.  Muscle Tone/Strength: Functionally intact. No obvious neuro-muscular anomalies detected.  Sensory (Neurological): Unimpaired        Sensory (Neurological): Unimpaired        DTR: Patellar: deferred today Achilles: deferred today Plantar: deferred today  DTR: Patellar: deferred today Achilles: deferred today Plantar: deferred today  Palpation: No palpable anomalies  Palpation: No palpable anomalies    Assessment  Primary Diagnosis & Pertinent Problem List: The primary encounter diagnosis was Chronic radicular lumbar pain. Diagnoses of Lumbar facet arthropathy, Neuroforaminal stenosis of lumbar spine, and Lumbar radiculopathy were also pertinent to this  visit.  Visit Diagnosis (New problems to examiner): 1. Chronic radicular lumbar pain   2. Lumbar facet arthropathy   3. Neuroforaminal stenosis of lumbar spine   4. Lumbar radiculopathy    Plan of Care (Initial workup plan)  Assessment and Plan    Low back pain with radiculopathy   Chronic low back pain with radiculopathy affects the right leg, worsened by a fall in December 2024. Pain is intermittent, radiating to the foot. Previous epidural injections were effective but not after the fall. Gabapentin  is used for pain management. Surgery was considered last year but deferred for physical therapy, which was initially effective. Current options include further imaging and potential advanced therapies. Surgery and spinal cord stimulation were discussed, with surgery carrying inherent risks. Further imaging is necessary to guide treatment decisions. Order an MRI of the lumbar spine to assess the current status post-fall and perform flexion-extension x-rays to evaluate spinal movement. Review MRI and x-ray results in approximately three weeks to discuss further treatment options. Consider advanced therapies such as spinal cord stimulation if indicated by imaging results.        Imaging Orders         DG PAIN CLINIC C-ARM 1-60 MIN NO REPORT         DG Lumbar Spine Complete W/Bend         MR LUMBAR SPINE WO CONTRAST        Provider-requested follow-up: Return in about 3 weeks (around 09/30/2023).  Future Appointments  Date Time Provider Department Center  09/12/2023  7:45 AM [facility]  09/15/2023  3:30 PM Artemio Larry, MD [facility] None  09/16/2023  2:00 PM Dellar Fenton, MD [facility]  09/30/2023  3:30 PM Elta Halter, MD [facility] None  10/15/2023  3:00 PM Elta Halter, MD [facility] None  01/27/2024  9:30 AM [facility]NUAL WELLNESS VISIT [facility]   I discussed the assessment and treatment plan with the patient.  The patient was provided an opportunity to ask  questions and all were answered. The patient agreed with the plan and demonstrated an understanding of the instructions.  Patient advised to call back or seek an in-person evaluation if the symptoms or condition worsens.  Duration of encounter: .  Total time on encounter, as per AMA guidelines included both the face-to-face and non-face-to-face time personally spent by the physician and/or other qualified health care professional(s) on the day of the encounter (includes time in activities that require the physician or other qualified health care professional and does not include time in activities normally performed by clinical staff). Physician's time may include the following activities when performed: Preparing to see the patient (e.g., pre-charting review of records, searching for previously ordered imaging, lab work, and nerve conduction tests) Review of prior analgesic pharmacotherapies. Reviewing PMP Interpreting ordered tests (e.g., lab work, imaging, nerve conduction tests) Performing post-procedure evaluations, including interpretation of diagnostic procedures Obtaining and/or reviewing separately obtained history Performing a medically appropriate examination and/or evaluation Counseling and educating the patient/family/caregiver Ordering medications, tests, or procedures Referring and communicating with other health care professionals (when not separately reported) Documenting clinical information in the electronic or other health record Independently interpreting results (not separately reported) and communicating results to the patient/ family/caregiver Care coordination (not separately reported)  Note by: Cephus Collin, MD (TTS and AI technology used. I apologize for any typographical errors that were not detected and corrected.) Date: 09/09/2023; Time: 9:13 AM

## 2023-09-11 ENCOUNTER — Other Ambulatory Visit: Payer: Self-pay

## 2023-09-11 DIAGNOSIS — R739 Hyperglycemia, unspecified: Secondary | ICD-10-CM

## 2023-09-11 DIAGNOSIS — Z13220 Encounter for screening for lipoid disorders: Secondary | ICD-10-CM

## 2023-09-11 DIAGNOSIS — L4050 Arthropathic psoriasis, unspecified: Secondary | ICD-10-CM

## 2023-09-12 ENCOUNTER — Other Ambulatory Visit (INDEPENDENT_AMBULATORY_CARE_PROVIDER_SITE_OTHER)

## 2023-09-12 DIAGNOSIS — L4050 Arthropathic psoriasis, unspecified: Secondary | ICD-10-CM | POA: Diagnosis not present

## 2023-09-12 DIAGNOSIS — Z13220 Encounter for screening for lipoid disorders: Secondary | ICD-10-CM

## 2023-09-12 DIAGNOSIS — R739 Hyperglycemia, unspecified: Secondary | ICD-10-CM | POA: Diagnosis not present

## 2023-09-12 LAB — BASIC METABOLIC PANEL WITH GFR
BUN: 13 mg/dL (ref 6–23)
CO2: 29 meq/L (ref 19–32)
Calcium: 9.3 mg/dL (ref 8.4–10.5)
Chloride: 104 meq/L (ref 96–112)
Creatinine, Ser: 0.82 mg/dL (ref 0.40–1.20)
GFR: 73.06 mL/min (ref >60.00)
Glucose, Bld: 90 mg/dL (ref 70–99)
Potassium: 3.6 meq/L (ref 3.5–5.1)
Sodium: 141 meq/L (ref 135–145)

## 2023-09-12 LAB — LIPID PANEL
Cholesterol: 167 mg/dL (ref 0–200)
HDL: 62.6 mg/dL (ref >39.00)
LDL Cholesterol: 89 mg/dL (ref 0–99)
NonHDL: 104.17
Total CHOL/HDL Ratio: 3
Triglycerides: 75 mg/dL (ref 0.0–149.0)
VLDL: 15 mg/dL (ref 0.0–40.0)

## 2023-09-12 LAB — HEMOGLOBIN A1C: Hgb A1c MFr Bld: 5.5 % (ref 4.6–6.5)

## 2023-09-12 LAB — HEPATIC FUNCTION PANEL
ALT: 11 U/L (ref 0–35)
AST: 16 U/L (ref 0–37)
Albumin: 4.1 g/dL (ref 3.5–5.2)
Alkaline Phosphatase: 81 U/L (ref 39–117)
Bilirubin, Direct: 0.1 mg/dL (ref 0.0–0.3)
Total Bilirubin: 0.6 mg/dL (ref 0.2–1.2)
Total Protein: 6.7 g/dL (ref 6.0–8.3)

## 2023-09-13 ENCOUNTER — Ambulatory Visit: Payer: Self-pay | Admitting: Internal Medicine

## 2023-09-15 ENCOUNTER — Encounter: Payer: Self-pay | Admitting: Dermatology

## 2023-09-15 ENCOUNTER — Ambulatory Visit (INDEPENDENT_AMBULATORY_CARE_PROVIDER_SITE_OTHER): Payer: Medicare PPO | Admitting: Dermatology

## 2023-09-15 DIAGNOSIS — L988 Other specified disorders of the skin and subcutaneous tissue: Secondary | ICD-10-CM

## 2023-09-15 NOTE — Progress Notes (Signed)
   Follow-Up Visit   Subjective  April Santos is a 68 y.o. 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   The following portions of the chart were reviewed this encounter and updated as appropriate: medications, allergies, medical history  Review of Systems:  No other skin or systemic complaints except as noted in HPI or Assessment and Plan.  Objective  Well appearing patient in no apparent distress; mood and affect are within normal limits.   A focused examination was performed of the following areas: face  Relevant exam findings are noted in the Assessment and Plan.  Before filler photos               After filler photos               Injection map    Assessment & Plan   FACIAL ELASTOSIS Perioral, chin Exam: Rhytides and volume loss.  Treatment Plan: Prior to the procedure, the patient's past medical history, allergies and the rare but potential risks and complications were reviewed with the patient and a signed consent was obtained. Pre and post-treatment care was discussed and instructions provided. Pt will likely 2 syringes for better result, but pt prefers to start conservatively and do another injection later if needed.  Location: lateral chin bil  Filler Type: Restylane defyne 1cc Lot [id number] exp 01/06/2024  Procedure: The area was prepped thoroughly with Puracyn. After introducing the needle into the desired treatment area, the syringe plunger was drawn back to ensure there was no flash of blood prior to injecting the filler in order to minimize risk of intravascular injection and vascular occlusion. After injection of the filler, the treated areas were cleansed and iced to reduce swelling. Post-treatment instructions were reviewed with the  patient.       Patient tolerated the procedure well. She did not feel like the lidocaine  in the product worked.  Next treatment will apply topical numbing prior to injections. The patient will call with any problems, questions or concerns prior to their next appointment.   Recommend daily broad spectrum sunscreen SPF 30+ to sun-exposed areas, reapply every 2 hours as needed. Call for new or changing lesions.  Staying in the shade or wearing long sleeves, sun glasses (UVA+UVB protection) and wide brim hats (4-inch brim around the entire circumference of the hat) are also recommended for sun protection.      Return for as scheduled.  I, Rollie Clipper, RMA, am acting as scribe for Artemio Larry, MD .   Documentation: I have reviewed the above documentation for accuracy and completeness, and I agree with the above.  Artemio Larry, MD

## 2023-09-15 NOTE — Patient Instructions (Signed)

## 2023-09-16 ENCOUNTER — Encounter: Payer: Self-pay | Admitting: Internal Medicine

## 2023-09-16 ENCOUNTER — Ambulatory Visit: Admitting: Internal Medicine

## 2023-09-16 VITALS — BP 120/70 | HR 69 | Temp 97.9°F | Resp 16 | Ht 61.0 in | Wt 127.5 lb

## 2023-09-16 DIAGNOSIS — R739 Hyperglycemia, unspecified: Secondary | ICD-10-CM | POA: Diagnosis not present

## 2023-09-16 DIAGNOSIS — G8929 Other chronic pain: Secondary | ICD-10-CM | POA: Diagnosis not present

## 2023-09-16 DIAGNOSIS — K219 Gastro-esophageal reflux disease without esophagitis: Secondary | ICD-10-CM | POA: Diagnosis not present

## 2023-09-16 DIAGNOSIS — L4050 Arthropathic psoriasis, unspecified: Secondary | ICD-10-CM

## 2023-09-16 DIAGNOSIS — F439 Reaction to severe stress, unspecified: Secondary | ICD-10-CM

## 2023-09-16 DIAGNOSIS — M75102 Unspecified rotator cuff tear or rupture of left shoulder, not specified as traumatic: Secondary | ICD-10-CM

## 2023-09-16 DIAGNOSIS — M5416 Radiculopathy, lumbar region: Secondary | ICD-10-CM

## 2023-09-16 DIAGNOSIS — Z136 Encounter for screening for cardiovascular disorders: Secondary | ICD-10-CM | POA: Insufficient documentation

## 2023-09-16 DIAGNOSIS — Z13220 Encounter for screening for lipoid disorders: Secondary | ICD-10-CM | POA: Diagnosis not present

## 2023-09-16 NOTE — Progress Notes (Signed)
 Subjective:    Patient ID: April Santos, female    DOB: 09/26/1955, 68 y.o.   MRN: [id number]  Patient here for a scheduled follow up.   HPI Here for a scheduled follow up. Is status post reverse left total shoulder arthroplasty for a massive rotator cuff tear 03/04/2023. Has been followed by orthopedics. Has been going to physical therapy. Dry needling. Seeing pain clinic for chronic lumbar pain. Planning for MRI lumbar spine tomorrow. Increased stress related to above continued issues.  Discussed recent lab results. Discussed calculated cholesterol risk - 6.9%. discussed calcium score. Agreeable. No chest pain. Breathing stable. No abdominal pain or bowel change.    Past Medical History:  Diagnosis Date   Arthritis    Colon polyps    COVID-19 2022   Disc disorder    Dysphagia    Family history of adverse reaction to anesthesia    mother nausea   GERD (gastroesophageal reflux disease)    History of uterine fibroid    Psoriasis    Past Surgical History:  Procedure Laterality Date   ABDOMINAL HYSTERECTOMY     BREAST SURGERY  1999   biopsy   carpal  2005   Tunnel right hand   CARPOMETACARPAL ([facility]) FUSION OF THUMB Right 11/16/2020   Procedure: Right CARPOMETACARPAL [facility]) ARTHROPLASTY;  Surgeon: Molli Angelucci, MD;  Location: [facility] ORS;  Service: Orthopedics;  Laterality: Right;   COLONOSCOPY WITH ESOPHAGOGASTRODUODENOSCOPY (EGD)     COLONOSCOPY WITH PROPOFOL  N/A 06/10/2016   Procedure: COLONOSCOPY WITH PROPOFOL ;  Surgeon: Cassie Click, MD;  Location: [facility] ENDOSCOPY;  Service: Endoscopy;  Laterality: N/A;   ESOPHAGOGASTRODUODENOSCOPY (EGD) WITH PROPOFOL  N/A 06/10/2016   Procedure: ESOPHAGOGASTRODUODENOSCOPY (EGD) WITH PROPOFOL ;  Surgeon: Cassie Click, MD;  Location: [facility] ENDOSCOPY;  Service: Endoscopy;  Laterality: N/A;   FOOT SURGERY  02/07/1003   right foot benign tumor   REVERSE SHOULDER ARTHROPLASTY Left 03/04/2023   Procedure: REVERSE SHOULDER ARTHROPLASTY;  Surgeon:  Elner Hahn, MD;  Location: [facility] ORS;  Service: Orthopedics;  Laterality: Left;   SACROILIAC JOINT INJECTION  08/17/13 & 04/13/14   Dr. Erman Hayward   SIGMOIDOSCOPY     UTERINE FIBROID SURGERY  1997   Family History  Problem Relation Age of Onset   Breast cancer Mother        71/74   Heart disease Father    Arthritis Father    GER disease Father    Diabetes Brother    Diabetes Maternal Uncle    Colon cancer Neg Hx    Social History   Socioeconomic History   Marital status: Significant Other    Spouse name: Daleen Dubs   Number of children: 0   Years of education: Not on file   Highest education level: Not on file  Occupational History   Not on file  Tobacco Use   Smoking status: Never   Smokeless tobacco: Never  Vaping Use   Vaping status: Never Used  Substance and Sexual Activity   Alcohol use: Yes    Alcohol/week: 1.0 standard drink of alcohol    Types: 1 Glasses of wine per week    Comment: rare   Drug use: No   Sexual activity: Yes    Birth control/protection: Surgical    Comment: Hysterectomy  Other Topics Concern   Not on file  Social History Narrative   Daleen Dubs - POA   Social Drivers of Health   Financial Resource Strain: Low Risk  (08/13/2023)   Received from  [facility]   Overall Financial Resource Strain (CARDIA)    Difficulty of Paying Living Expenses: Not hard at all  Food Insecurity: No Food Insecurity (08/13/2023)   [facility] from [facility]   Hunger Vital Sign    Within the past 12 months, you worried that your food would run out before you got the money to buy more.: Never true    Within the past 12 months, the food you bought just didn't last and you didn't have money to get more.: Never true  Transportation Needs: No Transportation Needs (08/13/2023)   Received from [facility] - Transportation    In the past 12 months, has lack of transportation kept you from medical  appointments or from getting medications?: No    Lack of Transportation (Non-Medical): No  Physical Activity: Sufficiently Active (01/22/2023)   Exercise Vital Sign    Days of Exercise per Week: 5 days    Minutes of Exercise per Session: 40 min  Stress: Stress Concern Present (01/22/2023)   [employer] of Occupational Health - Occupational Stress Questionnaire    Feeling of Stress : To some extent  Social Connections: Moderately Integrated (01/22/2023)   Social Connection and Isolation Panel    Frequency of Communication with Friends and Family: More than three times a week    Frequency of Social Gatherings with Friends and Family: More than three times a week    Attends Religious Services: More than 4 times per year    Active Member of [employer] or Organizations: No    Attends Banker Meetings: Never    Marital Status: Living with partner     Review of Systems  Constitutional:  Negative for appetite change and unexpected weight change.  HENT:  Negative for congestion and sinus pressure.   Respiratory:  Negative for cough, chest tightness and shortness of breath.   Cardiovascular:  Negative for chest pain, palpitations and leg swelling.  Gastrointestinal:  Negative for abdominal pain, diarrhea, nausea and vomiting.  Genitourinary:  Negative for difficulty urinating and dysuria.  Musculoskeletal:  Positive for back pain. Negative for joint swelling and myalgias.  Skin:  Negative for color change and rash.  Neurological:  Negative for dizziness and headaches.  Psychiatric/Behavioral:  Negative for agitation and dysphoric mood.        Objective:     BP 120/70   Pulse 69   Temp 97.9 F (36.6 C)   Resp 16   Ht 5' 1 (1.549 m)   Wt 127 lb 8 oz (57.8 kg)   LMP 02/26/2002   SpO2 99%   BMI 24.09 kg/m  Wt Readings from Last 3 Encounters:  09/16/23 127 lb 8 oz (57.8 kg)  09/09/23 128 lb (58.1 kg)  07/02/23 128 lb 1.6 oz (58.1 kg)    Physical Exam Vitals  reviewed.  Constitutional:      General: She is not in acute distress.    Appearance: Normal appearance.  HENT:     Head: Normocephalic and atraumatic.     Right Ear: External ear normal.     Left Ear: External ear normal.     Mouth/Throat:     Pharynx: No oropharyngeal exudate or posterior oropharyngeal erythema.   Eyes:     General: No scleral icterus.       Right Santos: No discharge.        Left Santos: No discharge.     Conjunctiva/sclera: Conjunctivae normal.  Neck:     Thyroid : No thyromegaly.   Cardiovascular:     Rate and Rhythm: Normal rate and regular rhythm.  Pulmonary:     Effort: No respiratory distress.     Breath sounds: Normal breath sounds. No wheezing.  Abdominal:     General: Bowel sounds are normal.     Palpations: Abdomen is soft.     Tenderness: There is no abdominal tenderness.   Musculoskeletal:        General: No swelling or tenderness.     Cervical back: Neck supple. No tenderness.  Lymphadenopathy:     Cervical: No cervical adenopathy.   Skin:    Findings: No erythema or rash.   Neurological:     Mental Status: She is alert.   Psychiatric:        Mood and Affect: Mood normal.        Behavior: Behavior normal.         Outpatient Encounter Medications as of 09/16/2023  Medication Sig   ALPRAZolam  (XANAX ) 0.25 MG tablet Take 1 tablet (0.25 mg total) by mouth daily as needed.   Apremilast  (OTEZLA ) 30 MG TABS Take 1 tablet (30 mg total) by mouth daily.   B Complex-C (B-COMPLEX WITH VITAMIN C) tablet Take 1 tablet by mouth daily.   Biotin-Vitamin C (HAIR SKIN NAILS GUMMIES PO) Take 2 tablets by mouth daily. Olly   cholecalciferol (VITAMIN D3) 25 MCG (1000 UNIT) tablet Take 1,000 Units by mouth daily.   Crisaborole  (EUCRISA ) 2 % OINT Apply 1 application topically 2 (two) times daily as needed (psoriasis).   Cyanocobalamin (VITAMIN B-12) 3000 MCG SUBL Place 6,000 mcg under the tongue daily at 2 am. Gummy   desonide  (DESOWEN ) 0.05 % cream Apply  1 application topically 2 (two) times daily as needed (psoriasis).   estradiol  (ESTRACE  VAGINAL) 0.1 MG/GM vaginal cream Insert 1 gram vaginally 2 times weekly as maintenance   gabapentin  (NEURONTIN ) 300 MG capsule Take 300 mg by mouth as directed. One in a.m., one at noon, two at bedtime   guaiFENesin -codeine  100-10 MG/5ML syrup Take 5 mLs by mouth at bedtime as needed for cough.   ibuprofen (ADVIL) 200 MG tablet Take 600 mg by mouth every 6 (six) hours as needed for headache.   Multiple Vitamins-Minerals (MULTIVITAMIN WITH MINERALS) tablet Take 1 tablet by mouth daily. Olly   mupirocin  ointment (BACTROBAN ) 2 % Apply 1 Application topically daily as needed (Wound).   OVER THE COUNTER MEDICATION Take 2 tablets by mouth daily.  Olly Heavenly Hair  gummy   RABEprazole  (ACIPHEX ) 20 MG tablet Take 1 tablet (20 mg total) by mouth 2 (two) times daily.   silver  sulfADIAZINE  (SILVADENE ) 1 % cream Apply 1 Application topically daily.   triamcinolone  cream (KENALOG ) 0.1 % Apply 1 Application topically 2 (two) times daily as needed. For up to 2 weeks. Avoid applying to face, groin, and axilla. Use as directed. Long-term use can cause thinning of the skin.   Wheat Dextrin (BENEFIBER) CHEW Chew 2 tablets by mouth every morning. Gummy   No facility-administered encounter medications on file as of 09/16/2023.     Lab Results  Component Value Date   WBC 5.3 02/25/2023   HGB 13.6 02/25/2023   HCT 40.4 02/25/2023   PLT 297 02/25/2023   GLUCOSE 90 09/12/2023   CHOL 167 09/12/2023   TRIG 75.0 09/12/2023   HDL 62.60 09/12/2023   LDLCALC 89 09/12/2023   ALT 11 09/12/2023   AST 16 09/12/2023  NA 141 09/12/2023   K 3.6 09/12/2023   CL 104 09/12/2023   CREATININE 0.82 09/12/2023   BUN 13 09/12/2023   CO2 29 09/12/2023   TSH 1.08 05/01/2023   HGBA1C 5.5 09/12/2023    DG Lumbar Spine Complete W/Bend Result Date: 09/15/2023 CLINICAL DATA:  Low back pain EXAM: LUMBAR SPINE - COMPLETE WITH BENDING VIEWS  COMPARISON:  CT 09/30/2022 FINDINGS: Levoscoliosis. 12 mm anterolisthesis L4 on L5. Vertebral body heights are maintained. Listhesis appears stable with flexion and extension. Multilevel degenerative changes. Moderate disc space narrowing L2-L3 and L3-L4 with advanced disc space narrowing at L4-L5 and L5-S1. Lower lumbar facet degenerative changes. IMPRESSION: Levoscoliosis with multilevel degenerative changes. 12 mm anterolisthesis L4 on L5 without evidence for motion with flexion or extension. Electronically Signed   By: Esmeralda Hedge M.D.   On: 09/15/2023 20:36   DG PAIN CLINIC C-ARM 1-60 MIN NO REPORT Result Date: 09/09/2023 Fluoro was used, but no Radiologist interpretation will be provided. Please refer to NOTES tab for provider progress note.      Assessment & Plan:  Hyperglycemia Assessment & Plan: Low carb diet and exercise. Follow met b and A1c.   Orders: -     Hemoglobin A1c; Future  Psoriatic arthritis [facility]) Assessment & Plan: Followed by dermatology. Continue otezla .   Orders: -     Basic metabolic panel with GFR; Future -     Hepatic function panel; Future  Screening cholesterol level -     Lipid panel; Future  Encounter for screening for coronary artery disease Assessment & Plan: Discussed calcium score. Agreeable - order for CT calcium score.   Orders: -     CT CARDIAC SCORING (SELF PAY ONLY); Future  Chronic radicular lumbar pain Assessment & Plan: Seeing ortho.  S/p ESI.  Referred to PT and [facility]. Evaluated by Dr Mont Antis 04/23/22 - referred for back pain and leg pain.  Was feeling better at that appt.  Has had PT and ESI's.  Anterolisthesis of L4 and L5 that could cause compression of the right L4 nerve root.  S/p ESI 07/02/22 - Dr Erman Hayward. Has been seeing Dr Mont Antis - f/u right leg pain most c/w L5 distribution pain.  Has been doing therapy.  Seeing pain clinic. Planning for MRI lumbar spine tomorrow.    Gastroesophageal reflux disease without  esophagitis Assessment & Plan: Continue aciphex . No upper symptoms reported.    Tear of left rotator cuff, unspecified tear extent, unspecified whether traumatic Assessment & Plan: Is s/p reverse left total shoulder arthroplasty for a massive rotator cuff tear - 03/04/23. Followed by ortho.  Has been going to physical therapy. Dry needling.    Stress Assessment & Plan: Increased stress. Overall appears to be handling things relatively well. Follow.       Dellar Fenton, MD

## 2023-09-17 ENCOUNTER — Ambulatory Visit
Admission: RE | Admit: 2023-09-17 | Discharge: 2023-09-17 | Disposition: A | Discharge status: Home / Self Care | Source: Ambulatory Visit | Attending: Student in an Organized Health Care Education/Training Program | Admitting: Student in an Organized Health Care Education/Training Program

## 2023-09-17 DIAGNOSIS — M48061 Spinal stenosis, lumbar region without neurogenic claudication: Secondary | ICD-10-CM | POA: Insufficient documentation

## 2023-09-17 DIAGNOSIS — M5137 Other intervertebral disc degeneration, lumbosacral region with discogenic back pain only: Secondary | ICD-10-CM | POA: Diagnosis not present

## 2023-09-17 DIAGNOSIS — G8929 Other chronic pain: Secondary | ICD-10-CM | POA: Insufficient documentation

## 2023-09-17 DIAGNOSIS — M5416 Radiculopathy, lumbar region: Secondary | ICD-10-CM | POA: Diagnosis not present

## 2023-09-17 DIAGNOSIS — M47816 Spondylosis without myelopathy or radiculopathy, lumbar region: Secondary | ICD-10-CM | POA: Insufficient documentation

## 2023-09-17 DIAGNOSIS — M419 Scoliosis, unspecified: Secondary | ICD-10-CM | POA: Diagnosis not present

## 2023-09-17 DIAGNOSIS — M5136 Other intervertebral disc degeneration, lumbar region with discogenic back pain only: Secondary | ICD-10-CM | POA: Diagnosis not present

## 2023-09-18 DIAGNOSIS — M5416 Radiculopathy, lumbar region: Secondary | ICD-10-CM | POA: Diagnosis not present

## 2023-09-21 ENCOUNTER — Encounter: Payer: Self-pay | Admitting: Internal Medicine

## 2023-09-21 NOTE — Assessment & Plan Note (Signed)
 Is s/p reverse left total shoulder arthroplasty for a massive rotator cuff tear - 03/04/23. Followed by ortho.  Has been going to physical therapy. Dry needling.

## 2023-09-21 NOTE — Assessment & Plan Note (Signed)
 Seeing ortho.  S/p ESI.  Referred to PT and [facility]. Evaluated by Dr Mont Antis 04/23/22 - referred for back pain and leg pain.  Was feeling better at that appt.  Has had PT and ESI's.  Anterolisthesis of L4 and L5 that could cause compression of the right L4 nerve root.  S/p ESI 07/02/22 - Dr Erman Hayward. Has been seeing Dr Mont Antis - f/u right leg pain most c/w L5 distribution pain.  Has been doing therapy.  Seeing pain clinic. Planning for MRI lumbar spine tomorrow.

## 2023-09-21 NOTE — Assessment & Plan Note (Signed)
Increased stress.  Overall appears to be handling things relatively well.  Follow.   

## 2023-09-21 NOTE — Assessment & Plan Note (Signed)
 Followed by dermatology. Continue otezla .

## 2023-09-21 NOTE — Assessment & Plan Note (Signed)
 Low-carb diet and exercise.  Follow met b and A1c.

## 2023-09-21 NOTE — Assessment & Plan Note (Signed)
 Discussed calcium score. Agreeable - order for CT calcium score.

## 2023-09-21 NOTE — Assessment & Plan Note (Signed)
Continue aciphex. No upper symptoms reported.

## 2023-09-24 ENCOUNTER — Ambulatory Visit
Admission: RE | Admit: 2023-09-24 | Discharge: 2023-09-24 | Disposition: A | Discharge status: Home / Self Care | Payer: Self-pay | Source: Ambulatory Visit | Attending: Internal Medicine | Admitting: Internal Medicine

## 2023-09-24 DIAGNOSIS — Z136 Encounter for screening for cardiovascular disorders: Secondary | ICD-10-CM | POA: Insufficient documentation

## 2023-09-25 ENCOUNTER — Ambulatory Visit: Payer: Self-pay | Admitting: Internal Medicine

## 2023-09-25 ENCOUNTER — Telehealth: Payer: Self-pay

## 2023-09-25 DIAGNOSIS — M5416 Radiculopathy, lumbar region: Secondary | ICD-10-CM | POA: Diagnosis not present

## 2023-09-25 NOTE — Telephone Encounter (Signed)
 Per chart Patient is not due for mammogram until 12/24/23.

## 2023-09-30 ENCOUNTER — Ambulatory Visit: Payer: Medicare PPO | Admitting: Dermatology

## 2023-09-30 ENCOUNTER — Ambulatory Visit: Admitting: Internal Medicine

## 2023-09-30 VITALS — BP 110/68 | HR 72 | Temp 98.0°F | Resp 16 | Ht 61.0 in | Wt 128.0 lb

## 2023-09-30 DIAGNOSIS — R739 Hyperglycemia, unspecified: Secondary | ICD-10-CM | POA: Diagnosis not present

## 2023-09-30 DIAGNOSIS — K219 Gastro-esophageal reflux disease without esophagitis: Secondary | ICD-10-CM

## 2023-09-30 DIAGNOSIS — R931 Abnormal findings on diagnostic imaging of heart and coronary circulation: Secondary | ICD-10-CM

## 2023-09-30 MED ORDER — ROSUVASTATIN CALCIUM 5 MG PO TABS
5.0000 mg | ORAL_TABLET | Freq: Every day | ORAL | 1 refills | Status: DC
Start: 2023-09-30 — End: 2024-01-22

## 2023-09-30 NOTE — Progress Notes (Signed)
 Subjective:    Patient ID: April Santos, female    DOB: [birth year], 68 y.o.   MRN: [id number]  Patient here for  Chief Complaint  Patient presents with   Discuss CT Calcium  score    HPI Here for work in appt - work in to discuss recent calcium  score. Recent screening - CT - calcium  score revealed calcium  score 516 (93 percentile). (LAD 203 and RCA 313). No chest pain. No sob. Family history of CAD. Discussed results of calcium  score. Discussed risk factor modification. Discussed starting statin medication given findings. She is hesitant given her increased msk symptoms. Discussed starting low dose to see if tolerated.  Also instructed to take ECASA 81mg  q day. Discussed referral to cardiology for further w/up.     Past Medical History:  Diagnosis Date   Arthritis    Colon polyps    COVID-19 2022   Disc disorder    Dysphagia    Family history of adverse reaction to anesthesia    mother nausea   GERD (gastroesophageal reflux disease)    History of uterine fibroid    Psoriasis    Past Surgical History:  Procedure Laterality Date   ABDOMINAL HYSTERECTOMY     BREAST SURGERY  1999   biopsy   carpal  2005   Tunnel right hand   CARPOMETACARPAL ([facility]) FUSION OF THUMB Right 11/16/2020   Procedure: Right CARPOMETACARPAL [facility]) ARTHROPLASTY;  Surgeon: Kathlynn Sharper, MD;  Location: [facility] ORS;  Service: Orthopedics;  Laterality: Right;   COLONOSCOPY WITH ESOPHAGOGASTRODUODENOSCOPY (EGD)     COLONOSCOPY WITH PROPOFOL  N/A 06/10/2016   Procedure: COLONOSCOPY WITH PROPOFOL ;  Surgeon: Lamar ONEIDA Holmes, MD;  Location: [facility] ENDOSCOPY;  Service: Endoscopy;  Laterality: N/A;   ESOPHAGOGASTRODUODENOSCOPY (EGD) WITH PROPOFOL  N/A 06/10/2016   Procedure: ESOPHAGOGASTRODUODENOSCOPY (EGD) WITH PROPOFOL ;  Surgeon: Lamar ONEIDA Holmes, MD;  Location: [facility] ENDOSCOPY;  Service: Endoscopy;  Laterality: N/A;   FOOT SURGERY  02/07/1003   right foot benign tumor   REVERSE SHOULDER ARTHROPLASTY Left 03/04/2023    Procedure: REVERSE SHOULDER ARTHROPLASTY;  Surgeon: Edie Norleen PARAS, MD;  Location: [facility] ORS;  Service: Orthopedics;  Laterality: Left;   SACROILIAC JOINT INJECTION  08/17/13 & 04/13/14   Dr. Avanell   SIGMOIDOSCOPY     UTERINE FIBROID SURGERY  1997   Family History  Problem Relation Age of Onset   Breast cancer Mother        61/74   Heart disease Father    Arthritis Father    GER disease Father    Diabetes Brother    Diabetes Maternal Uncle    Colon cancer Neg Hx    Social History   Socioeconomic History   Marital status: Significant Other    Spouse name: Vinie Molt   Number of children: 0   Years of education: Not on file   Highest education level: Not on file  Occupational History   Not on file  Tobacco Use   Smoking status: Never   Smokeless tobacco: Never  Vaping Use   Vaping status: Never Used  Substance and Sexual Activity   Alcohol use: Yes    Alcohol/week: 1.0 standard drink of alcohol    Types: 1 Glasses of wine per week    Comment: rare   Drug use: No   Sexual activity: Yes    Birth control/protection: Surgical    Comment: Hysterectomy  Other Topics Concern   Not on file  Social History Narrative   Vinie Molt - POA  Social Drivers of Corporate investment banker Strain: Low Risk  (08/13/2023)   Received from [facility]   Overall Financial Resource Strain (CARDIA)    Difficulty of Paying Living Expenses: Not hard at all  Food Insecurity: No Food Insecurity (08/13/2023)   Received from [facility]   Hunger Vital Sign    Within the past 12 months, you worried that your food would run out before you got the money to buy more.: Never true    Within the past 12 months, the food you bought just didn't last and you didn't have money to get more.: Never true  Transportation Needs: No Transportation Needs (08/13/2023)   Received from [facility] - Transportation    In the past 12 months, has lack  of transportation kept you from medical appointments or from getting medications?: No    Lack of Transportation (Non-Medical): No  Physical Activity: Sufficiently Active (01/22/2023)   Exercise Vital Sign    Days of Exercise per Week: 5 days    Minutes of Exercise per Session: 40 min  Stress: Stress Concern Present (01/22/2023)   [employer] of Occupational Health - Occupational Stress Questionnaire    Feeling of Stress : To some extent  Social Connections: Moderately Integrated (01/22/2023)   Social Connection and Isolation Panel    Frequency of Communication with Friends and Family: More than three times a week    Frequency of Social Gatherings with Friends and Family: More than three times a week    Attends Religious Services: More than 4 times per year    Active Member of [employer] or Organizations: No    Attends Banker Meetings: Never    Marital Status: Living with partner     Review of Systems  Constitutional:  Negative for appetite change and unexpected weight change.  HENT:  Negative for congestion and sinus pressure.   Respiratory:  Negative for cough, chest tightness and shortness of breath.   Cardiovascular:  Negative for chest pain, palpitations and leg swelling.  Gastrointestinal:  Negative for abdominal pain, diarrhea, nausea and vomiting.  Genitourinary:  Negative for difficulty urinating and dysuria.  Musculoskeletal:  Negative for myalgias.  Skin:  Negative for color change and rash.  Neurological:  Negative for dizziness and headaches.  Psychiatric/Behavioral:  Negative for agitation and dysphoric mood.        Objective:     BP 110/68   Pulse 72   Temp 98 F (36.7 C)   Resp 16   Ht 5' 1 (1.549 m)   Wt 128 lb (58.1 kg)   LMP 02/26/2002   SpO2 99%   BMI 24.19 kg/m  Wt Readings from Last 3 Encounters:  10/02/23 128 lb (58.1 kg)  09/30/23 128 lb (58.1 kg)  09/16/23 127 lb 8 oz (57.8 kg)    Physical Exam Vitals reviewed.   Constitutional:      General: She is not in acute distress.    Appearance: Normal appearance.  HENT:     Head: Normocephalic and atraumatic.     Right Ear: External ear normal.     Left Ear: External ear normal.     Mouth/Throat:     Pharynx: No oropharyngeal exudate or posterior oropharyngeal erythema.   Eyes:     General: No scleral icterus.       Right eye: No discharge.        Left eye: No discharge.  Conjunctiva/sclera: Conjunctivae normal.   Neck:     Thyroid : No thyromegaly.   Cardiovascular:     Rate and Rhythm: Normal rate and regular rhythm.  Pulmonary:     Effort: No respiratory distress.     Breath sounds: Normal breath sounds. No wheezing.  Abdominal:     General: Bowel sounds are normal.     Palpations: Abdomen is soft.     Tenderness: There is no abdominal tenderness.   Musculoskeletal:        General: No swelling or tenderness.     Cervical back: Neck supple. No tenderness.  Lymphadenopathy:     Cervical: No cervical adenopathy.   Skin:    Findings: No erythema or rash.   Neurological:     Mental Status: She is alert.   Psychiatric:        Mood and Affect: Mood normal.        Behavior: Behavior normal.         Outpatient Encounter Medications as of 09/30/2023  Medication Sig   rosuvastatin  (CRESTOR ) 5 MG tablet Take 1 tablet (5 mg total) by mouth daily.   ALPRAZolam  (XANAX ) 0.25 MG tablet Take 1 tablet (0.25 mg total) by mouth daily as needed.   Apremilast  (OTEZLA ) 30 MG TABS Take 1 tablet (30 mg total) by mouth daily.   B Complex-C (B-COMPLEX WITH VITAMIN C) tablet Take 1 tablet by mouth daily.   Biotin-Vitamin C (HAIR SKIN NAILS GUMMIES PO) Take 2 tablets by mouth daily. Olly   cholecalciferol (VITAMIN D3) 25 MCG (1000 UNIT) tablet Take 1,000 Units by mouth daily.   Crisaborole  (EUCRISA ) 2 % OINT Apply 1 application topically 2 (two) times daily as needed (psoriasis).   Cyanocobalamin (VITAMIN B-12) 3000 MCG SUBL Place 6,000 mcg under  the tongue daily at 2 am. Gummy   desonide  (DESOWEN ) 0.05 % cream Apply 1 application topically 2 (two) times daily as needed (psoriasis).   estradiol  (ESTRACE  VAGINAL) 0.1 MG/GM vaginal cream Insert 1 gram vaginally 2 times weekly as maintenance   gabapentin  (NEURONTIN ) 300 MG capsule Take 300 mg by mouth as directed. One in a.m., one at noon, two at bedtime   guaiFENesin -codeine  100-10 MG/5ML syrup Take 5 mLs by mouth at bedtime as needed for cough.   ibuprofen (ADVIL) 200 MG tablet Take 600 mg by mouth every 6 (six) hours as needed for headache.   Multiple Vitamins-Minerals (MULTIVITAMIN WITH MINERALS) tablet Take 1 tablet by mouth daily. Olly   mupirocin  ointment (BACTROBAN ) 2 % Apply 1 Application topically daily as needed (Wound).   OVER THE COUNTER MEDICATION Take 2 tablets by mouth daily.  Olly Heavenly Hair  gummy   RABEprazole  (ACIPHEX ) 20 MG tablet Take 1 tablet (20 mg total) by mouth 2 (two) times daily.   silver  sulfADIAZINE  (SILVADENE ) 1 % cream Apply 1 Application topically daily.   triamcinolone  cream (KENALOG ) 0.1 % Apply 1 Application topically 2 (two) times daily as needed. For up to 2 weeks. Avoid applying to face, groin, and axilla. Use as directed. Long-term use can cause thinning of the skin.   Wheat Dextrin (BENEFIBER) CHEW Chew 2 tablets by mouth every morning. Gummy   No facility-administered encounter medications on file as of 09/30/2023.     Lab Results  Component Value Date   WBC 5.3 02/25/2023   HGB 13.6 02/25/2023   HCT 40.4 02/25/2023   PLT 297 02/25/2023   GLUCOSE 90 09/12/2023   CHOL 167 09/12/2023   TRIG 75.0 09/12/2023  HDL 62.60 09/12/2023   LDLCALC 89 09/12/2023   ALT 11 09/12/2023   AST 16 09/12/2023   NA 141 09/12/2023   K 3.6 09/12/2023   CL 104 09/12/2023   CREATININE 0.82 09/12/2023   BUN 13 09/12/2023   CO2 29 09/12/2023   TSH 1.08 05/01/2023   HGBA1C 5.5 09/12/2023    CT CARDIAC SCORING (SELF PAY ONLY) Result Date:  09/24/2023 CLINICAL DATA:  Cardiovascular Disease Risk stratification EXAM: Coronary Calcium  Score TECHNIQUE: A gated, non-contrast computed tomography scan of the heart was performed using 3mm slice thickness. Axial images were analyzed on a dedicated workstation. Calcium  scoring of the coronary arteries was performed using the Agatston method. FINDINGS: Coronary Calcium  Score: Left main: 0 Left anterior descending artery: 203 Left circumflex artery: 0 Right coronary artery: 313 Total: 516 Percentile: 93 Pericardium: Normal. Ascending Aorta: Normal caliber.  Aortic atherosclerosis. Non-cardiac: See separate report from [facility]. IMPRESSION: Coronary calcium  score of 516. This was 66 percentile for age-, race-, and sex-matched controls. Aortic atherosclerosis. RECOMMENDATIONS: Coronary artery calcium  (CAC) score is a strong predictor of incident coronary heart disease (CHD) and provides predictive information beyond traditional risk factors. CAC scoring is reasonable to use in the decision to withhold, postpone, or initiate statin therapy in intermediate-risk or selected borderline-risk asymptomatic adults (age 47-75 years and LDL-C >=70 to <190 mg/dL) who do not have diabetes or established atherosclerotic cardiovascular disease (ASCVD).* In intermediate-risk (10-year ASCVD risk >=7.5% to <20%) adults or selected borderline-risk (10-year ASCVD risk >=5% to <7.5%) adults in whom a CAC score is measured for the purpose of making a treatment decision the following recommendations have been made: If CAC=0, it is reasonable to withhold statin therapy and reassess in 5 to 10 years, as long as higher risk conditions are absent (diabetes mellitus, family history of premature CHD in first degree relatives (males <55 years; females <65 years), cigarette smoking, or LDL >=190 mg/dL). If CAC is 1 to 99, it is reasonable to initiate statin therapy for patients >=67 years of age. If CAC is >=100 or >=75th percentile,  it is reasonable to initiate statin therapy at any age. Cardiology referral should be considered for patients with CAC scores >=400 or >=75th percentile. *2018 AHA/ACC/AACVPR/AAPA/ABC/ACPM/ADA/AGS/APhA/ASPC/NLA/PCNA Guideline on the Management of Blood Cholesterol: A Report of the American College of Cardiology/American Heart Association Task Force on Clinical Practice Guidelines. J Am Coll Cardiol. 2019;73(24):3168-3209. Redell Shallow, MD Electronically Signed   By: Redell Shallow M.D.   On: 09/24/2023 16:21       Assessment & Plan:  Elevated coronary artery calcium  score Assessment & Plan: Discussed results of recent calcium  score.  (516 - 93 percentile). Discussed risk factor modification.  Discussed starting statin medication given findings. She is hesitant given her increased msk symptoms. Discussed starting low dose to see if tolerated.  Agreeable to start low dose crestor  three days per week. If tolerates, plan to increase dose. Also instructed to take ECASA 81mg  q day. Discussed referral to cardiology for further w/up.  Agreeable for referral.   Orders: -     Ambulatory referral to Cardiology  Gastroesophageal reflux disease without esophagitis Assessment & Plan: Continue aciphex . No upper symptoms reported.    Hyperglycemia Assessment & Plan: Low carb diet and exercise. Follow met b and A1c.    Other orders -     Rosuvastatin  Calcium ; Take 1 tablet (5 mg total) by mouth daily.  Dispense: 90 tablet; Refill: 1     Allena Hamilton, MD

## 2023-10-02 ENCOUNTER — Ambulatory Visit
Attending: Student in an Organized Health Care Education/Training Program | Admitting: Student in an Organized Health Care Education/Training Program

## 2023-10-02 ENCOUNTER — Encounter: Payer: Self-pay | Admitting: Student in an Organized Health Care Education/Training Program

## 2023-10-02 VITALS — BP 122/75 | HR 66 | Temp 98.0°F | Resp 16 | Ht 61.0 in | Wt 128.0 lb

## 2023-10-02 DIAGNOSIS — M5416 Radiculopathy, lumbar region: Secondary | ICD-10-CM | POA: Insufficient documentation

## 2023-10-02 DIAGNOSIS — M4726 Other spondylosis with radiculopathy, lumbar region: Secondary | ICD-10-CM

## 2023-10-02 DIAGNOSIS — G8929 Other chronic pain: Secondary | ICD-10-CM | POA: Insufficient documentation

## 2023-10-02 DIAGNOSIS — M48062 Spinal stenosis, lumbar region with neurogenic claudication: Secondary | ICD-10-CM | POA: Insufficient documentation

## 2023-10-02 DIAGNOSIS — G894 Chronic pain syndrome: Secondary | ICD-10-CM | POA: Insufficient documentation

## 2023-10-02 DIAGNOSIS — M48061 Spinal stenosis, lumbar region without neurogenic claudication: Secondary | ICD-10-CM | POA: Insufficient documentation

## 2023-10-02 DIAGNOSIS — M47816 Spondylosis without myelopathy or radiculopathy, lumbar region: Secondary | ICD-10-CM | POA: Insufficient documentation

## 2023-10-02 DIAGNOSIS — R1310 Dysphagia, unspecified: Secondary | ICD-10-CM | POA: Diagnosis not present

## 2023-10-02 DIAGNOSIS — K5903 Drug induced constipation: Secondary | ICD-10-CM | POA: Diagnosis not present

## 2023-10-02 MED ORDER — HYDROCODONE-ACETAMINOPHEN 5-325 MG PO TABS: 1.0000 | ORAL_TABLET | Freq: Two times a day (BID) | ORAL | 0 refills | Status: AC | PRN

## 2023-10-02 NOTE — Progress Notes (Signed)
 Safety precautions to be maintained throughout the outpatient stay will include: orient to surroundings, keep bed in low position, maintain call bell within reach at all times, provide assistance with transfer out of bed and ambulation.

## 2023-10-02 NOTE — Addendum Note (Signed)
 Addended by: MARCELINO NURSE on: 10/02/2023 03:58 PM   Modules accepted: Orders, Level of Service

## 2023-10-02 NOTE — Addendum Note (Signed)
 Addended by: MARCELINO NURSE on: 10/02/2023 03:56 PM   Modules accepted: Orders, Level of Service

## 2023-10-02 NOTE — Progress Notes (Addendum)
 PROVIDER NOTE: Interpretation of information contained herein should be left to medically-trained personnel. Specific patient instructions are provided elsewhere under Patient Instructions section of medical record. This document was created in part using AI and STT-dictation technology, any transcriptional errors that may result from this process are unintentional.  Patient: April Santos  Service: E/M   PCP: Glendia Shad, MD  DOB: 01-16-1956  DOS: 10/02/2023  Provider: Wallie Sherry, MD  MRN: [id number]  Delivery: Face-to-face  Specialty: Interventional Pain Management  Type: Established Patient  Setting: Ambulatory outpatient facility  Specialty designation: 09  Referring Prov.: Glendia Shad, MD  Location: Outpatient office facility       History of present illness (HPI) [name], a 68 y.o. year old female, is here today because of her Chronic radicular lumbar pain [M54.16, G89.29]. [name]'s primary complain today is Pain (Buttocks bilat, right is worse)   Pain Assessment: Severity of Chronic pain is reported as a 8 /10. Location: Buttocks Right, Left (right is worse)/down back of right leg to foot, down back of left leg to behind knee. Onset: More than a month ago. Quality: Aching, Burning. Timing: Constant. Modifying factor(s): gaba, bending, stretching, lying down, walking, heat, PT. Vitals:  height is 5' 1 (1.549 m) and weight is 128 lb (58.1 kg). Her temperature is 98 F (36.7 C). Her blood pressure is 122/75 and her pulse is 66. Her respiration is 16 and oxygen saturation is 100%.  BMI: Estimated body mass index is 24.19 kg/m as calculated from the following:   Height as of this encounter: 5' 1 (1.549 m).   Weight as of this encounter: 128 lb (58.1 kg).  Last encounter: 09/09/2023. Last procedure: Visit date not found.  Reason for encounter: 2nd pt visit.   Discussed the use of AI scribe software for clinical note transcription with the patient, who gave verbal  consent to proceed.  History of Present Illness   April Santos is a 68 year old female who presents with low back and leg pain (bilateral right worse than left)  She experiences low back pain radiating predominantly to her right leg, occasionally affecting her left leg. This issue has persisted for some time. She previously received lumbar epidural steroid injections with Dr Avanell PM&R , which became less effective.  She did benefit from  physical therapy. While this treatment was beneficial last year, her current two-month course of therapy has not shown the same progress.  She has severe impingement at the L4-L5 level of her spine. A recent MRI indicated worsening compression.  For pain management, she uses gabapentin . Previously, she was prescribed hydrocodone , which she took as needed, not daily, at a dose of 5 mg. She also has leftover oxycodone  from a surgery in November 2024, which she uses occasionally for severe pain, most recently taking one pill yesterday morning.       HPI from initial clinic visit: April Santos is a 68 year old female with chronic low back pain who presents with worsening pain radiating down her right leg after a fall.   She has chronic low back pain that radiates down her right leg more than her left. The pain worsened following a fall on March 18, 2023, when she slipped on a wet surface and landed on cement. Since the fall, the pain has been intermittent, with periods of exacerbation, particularly when sitting at her computer desk for work.   She has a history of undergoing physical therapy, which was  initially effective, allowing her to resume walking five miles a day. However, after the fall, she resumed physical therapy focusing on her hips two weeks ago. Despite continuing exercises at home and at the hospital, she reports limited relief.   She has received epidural injections for the past eight to nine years, initially once a year, then twice a  year. Following the fall, she received an epidural on March 25, 2023, and another on July 08, 2023, but noted that the relief was not sustained after the anesthetic wore off. She has not had spine surgery.   Her pain extends sometimes all the way down her leg to the bottom of her foot, with significant discomfort in the buttock area. She has not been diagnosed with plantar fasciitis. She is unsure if she has had sacroiliac joint injections in the past.   Her current medications include gabapentin , which she takes at a dose of 300 mg in the morning, 300 mg at lunch, and 600 mg at night. She previously used hydrocodone  for severe pain but discontinued it.   She has had x-rays in March 2025 and an MRI in 2024, but no MRI since her fall.  ROS  Constitutional: Denies any fever or chills Gastrointestinal: No reported hemesis, hematochezia, vomiting, or acute GI distress Musculoskeletal: Low back and radiating right>left leg pain Neurological: No reported episodes of acute onset apraxia, aphasia, dysarthria, agnosia, amnesia, paralysis, loss of coordination, or loss of consciousness  Medication Review  ALPRAZolam , Apremilast , B-complex with vitamin C, Benefiber, Biotin-Vitamin C, Crisaborole , HYDROcodone -acetaminophen , OVER THE COUNTER MEDICATION, RABEprazole , Vitamin B-12, aspirin EC, cholecalciferol, desonide , estradiol , gabapentin , guaiFENesin -codeine , ibuprofen, multivitamin with minerals, mupirocin  ointment, rosuvastatin, silver  sulfADIAZINE , and triamcinolone  cream  History Review  Allergy: [name] is allergic to cefdinir and erythromycin ethylsuccinate [erythromycin]. Drug: [name]  reports no history of drug use. Alcohol:  reports current alcohol use of about 1.0 standard drink of alcohol per week. Tobacco:  reports that she has never smoked. She has never used smokeless tobacco. Social: [name]  reports that she has never smoked. She has never used smokeless tobacco. She reports  current alcohol use of about 1.0 standard drink of alcohol per week. She reports that she does not use drugs. Medical:  has a past medical history of Arthritis, Colon polyps, COVID-19 (2022), Disc disorder, Dysphagia, Family history of adverse reaction to anesthesia, GERD (gastroesophageal reflux disease), History of uterine fibroid, and Psoriasis. Surgical: [name]  has a past surgical history that includes Uterine fibroid surgery (1997); Breast surgery (1999); Foot surgery (02/07/1003); carpal (2005); Sacroiliac joint injection (08/17/13 & 04/13/14); Colonoscopy with esophagogastroduodenoscopy (egd); Abdominal hysterectomy; Sigmoidoscopy; Colonoscopy with propofol  (N/A, 06/10/2016); Esophagogastroduodenoscopy (egd) with propofol  (N/A, 06/10/2016); Carpometacarpal (cmc) fusion of thumb (Right, 11/16/2020); and Reverse shoulder arthroplasty (Left, 03/04/2023). Family: family history includes Arthritis in her father; Breast cancer in her mother; Diabetes in her brother and maternal uncle; GER disease in her father; Heart disease in her father.  Laboratory Chemistry Profile   Renal Lab Results  Component Value Date   BUN 13 09/12/2023   CREATININE 0.82 09/12/2023   GFR 73.06 09/12/2023   GFRNONAA >60 02/25/2023    Hepatic Lab Results  Component Value Date   AST 16 09/12/2023   ALT 11 09/12/2023   ALBUMIN 4.1 09/12/2023   ALKPHOS 81 09/12/2023    Electrolytes Lab Results  Component Value Date   NA 141 09/12/2023   K 3.6 09/12/2023   CL 104 09/12/2023   CALCIUM 9.3 09/12/2023  Bone Lab Results  Component Value Date   VD25OH 50.93 05/01/2023    Inflammation (CRP: Acute Phase) (ESR: Chronic Phase) Lab Results  Component Value Date   CRP 0.2 (L) 06/03/2016   ESRSEDRATE 3 06/03/2016         Note: Above Lab results reviewed.  Recent Imaging Review  MR LUMBAR SPINE WO CONTRAST CLINICAL DATA:  Low back pain  EXAM: MRI LUMBAR SPINE WITHOUT CONTRAST  TECHNIQUE: Multiplanar,  multisequence MR imaging of the lumbar spine was performed. No intravenous contrast was administered.  COMPARISON:  Radiograph 09/09/2023 and MRI 08/07/2022  FINDINGS: Segmentation: The lowest lumbar type non-rib-bearing vertebra is labeled as L5.  Alignment: Moderate levoconvex lumbar scoliosis with rotary component.  Multilevel subluxation including:  3 mm degenerative retrolisthesis at L1-2  3 mm degenerative retrolisthesis at L2-3  9 mm degenerative retrolisthesis at L4-5  Vertebrae: Disc desiccation and loss of disc height at all levels from L2 and S1.  Substantial type 1 degenerative endplate findings at L4-5. Substantial degenerative facet edema bilaterally at L4-5 which can be a direct source for lumbar back pain.  Conus medullaris and cauda equina: Conus extends to the L1-2 level. Conus and cauda equina appear normal.  Paraspinal and other soft tissues: Bilateral renal fluid signal intensity lesions are compatible with cysts. No further imaging workup of these lesions is indicated.  Cholelithiasis.  Disc levels:  T12-L1: No impingement.  Mild disc bulge  L1-2: No impingement.  Left eccentric disc bulge.  L2-3: Mild right foraminal stenosis related to facet arthropathy and disc bulge. Essentially stable.  L3-4: Moderate right foraminal stenosis due to right foraminal disc osteophyte complex and facet arthropathy. Roughly stable from previous.  L4-5: Severe central narrowing of the thecal sac with severe right and mild left foraminal stenosis and moderate bilateral subarticular lateral recess stenosis due to disc uncovering, central and right foraminal disc protrusion, disc bulge, facet arthropathy, and ligamentum flavum redundancy. The central narrowing of the thecal sac appears minimally worsened from prior.  L5-S1: Mild left foraminal stenosis due to disc osteophyte complex and facet arthropathy. Mildly worsened from prior.  IMPRESSION: 1. Lumbar  spondylosis and degenerative disc disease, causing severe impingement at L4-5; moderate impingement at L3-4; and mild impingement at L2-3 and L5-S1. 2. Moderate levoconvex lumbar scoliosis with rotary component. 3. Substantial degenerative facet edema bilaterally at L4-5 which can be a direct source for lumbar back pain. 4. Cholelithiasis.  Electronically Signed   By: Ryan Salvage M.D.   On: 10/02/2023 14:27 Note: Reviewed        Physical Exam  General appearance: Well nourished, well developed, and well hydrated. In no apparent acute distress Mental status: Alert, oriented x 3 (person, place, & time)       Respiratory: No evidence of acute respiratory distress Eyes: PERLA Vitals: BP 122/75   Pulse 66   Temp 98 F (36.7 C)   Resp 16   Ht 5' 1 (1.549 m)   Wt 128 lb (58.1 kg)   LMP 02/26/2002   SpO2 100%   BMI 24.19 kg/m  BMI: Estimated body mass index is 24.19 kg/m as calculated from the following:   Height as of this encounter: 5' 1 (1.549 m).   Weight as of this encounter: 128 lb (58.1 kg). Ideal: Ideal body weight: 47.8 kg (105 lb 6.1 oz) Adjusted ideal body weight: 51.9 kg (114 lb 6.8 oz)  Lumbar Spine Area Exam  Skin & Axial Inspection: No masses, redness, or swelling Alignment: Symmetrical  Functional ROM: Pain restricted ROM       Stability: No instability detected Muscle Tone/Strength: Functionally intact. No obvious neuro-muscular anomalies detected. Sensory (Neurological): Dermatomal pain pattern right L3/4 Palpation: No palpable anomalies       Provocative Tests: Hyperextension/rotation test: (+) due to pain. Lumbar quadrant test (Kemp's test): (+) on the right>left for foraminal stenosis   Gait & Posture Assessment  Ambulation: Unassisted Gait: Relatively normal for age and body habitus Posture: WNL  Lower Extremity Exam      Side: Right lower extremity   Side: Left lower extremity  Stability: No instability observed           Stability: No  instability observed          Skin & Extremity Inspection: Skin color, temperature, and hair growth are WNL. No peripheral edema or cyanosis. No masses, redness, swelling, asymmetry, or associated skin lesions. No contractures.   Skin & Extremity Inspection: Skin color, temperature, and hair growth are WNL. No peripheral edema or cyanosis. No masses, redness, swelling, asymmetry, or associated skin lesions. No contractures.  Functional ROM: Unrestricted ROM                   Functional ROM: Unrestricted ROM                  Muscle Tone/Strength: Functionally intact. No obvious neuro-muscular anomalies detected.   Muscle Tone/Strength: Functionally intact. No obvious neuro-muscular anomalies detected.  Sensory (Neurological): Unimpaired         Sensory (Neurological): Unimpaired        DTR: Patellar: deferred today Achilles: deferred today Plantar: deferred today   DTR: Patellar: deferred today Achilles: deferred today Plantar: deferred today  Palpation: No palpable anomalies   Palpation: No palpable anomalies     Assessment   Diagnosis Status  1. Chronic radicular lumbar pain   2. Lumbar facet arthropathy   3. Neuroforaminal stenosis of lumbar spine   4. Lumbar radiculopathy   5. Spinal stenosis, lumbar region, with neurogenic claudication   6. Chronic pain syndrome    Controlled Controlled Controlled   Updated Problems: No problems updated.  Plan of Care  Assessment and Plan    Severe spinal stenosis at L4-L5   Recent MRI shows worsening compression at L4-L5. Previous treatments, including epidural injections, are less effective due to severe impingement. Physical therapy progress has declined compared to last year. Surgical intervention may be necessary to decompress the nerve. If surgery is not recommended, consider a spinal cord stimulator for pain management, which involves a trial period with external components before permanent implantation. Refer to Dr. Clois to  evaluate the need for surgery based on MRI findings. If surgery is not advised, discuss the spinal cord stimulator option and provide information on the trial and implantation process.  Chronic pain due to spinal stenosis   She experiences chronic low back and predominantly right leg pain, occasionally affecting the left leg, due to severe spinal stenosis at L4-L5. Pain management is challenging with limited options. Gabapentin  is currently used, but additional relief is needed. Hydrocodone  has been used in the past for severe pain on an as-needed basis.  Obtain baseline urine specimen and have patient complete controlled substance agreement, prescribe hydrocodone  5 mg as needed for severe pain.       Pharmacotherapy (Medications Ordered): Meds ordered this encounter  Medications   HYDROcodone -acetaminophen  (NORCO/VICODIN) 5-325 MG tablet    Sig: Take 1 tablet by mouth every 12 (twelve) hours as  needed for severe pain (pain score 7-10). Must last 30 days.    Dispense:  60 tablet    Refill:  0    Chronic Pain: STOP Act (Not applicable) Fill 1 day early if closed on refill date. Avoid benzodiazepines within 8 hours of opioids   Orders:  Orders Placed This Encounter  Procedures   Compliance Drug Analysis, Ur    Volume: 30 ml(s). Minimum 3 ml of urine is needed. Document temperature of fresh sample. Indications: Long term (current) use of opiate analgesic (S20.108) Test#: [phone] (Comprehensive Profile)    Release to patient:   Immediate     Return for patient will call to schedule F2F appt prn.    Recent Visits Date Type Provider Dept  09/09/23 Office Visit Marcelino Nurse, MD [facility]  Showing recent visits within past 90 days and meeting all other requirements Today's Visits Date Type Provider Dept  10/02/23 Office Visit Marcelino Nurse, MD [facility]  Showing today's visits and meeting all other requirements Future Appointments Date Type Provider Dept   10/07/23 Appointment Marcelino Nurse, MD [facility]  Showing future appointments within next 90 days and meeting all other requirements  I discussed the assessment and treatment plan with the patient. The patient was provided an opportunity to ask questions and all were answered. The patient agreed with the plan and demonstrated an understanding of the instructions.  Patient advised to call back or seek an in-person evaluation if the symptoms or condition worsens.  Duration of encounter: .  Total time on encounter, as per AMA guidelines included both the face-to-face and non-face-to-face time personally spent by the physician and/or other qualified health care professional(s) on the day of the encounter (includes time in activities that require the physician or other qualified health care professional and does not include time in activities normally performed by clinical staff). Physician's time may include the following activities when performed: Preparing to see the patient (e.g., pre-charting review of records, searching for previously ordered imaging, lab work, and nerve conduction tests) Review of prior analgesic pharmacotherapies. Reviewing PMP Interpreting ordered tests (e.g., lab work, imaging, nerve conduction tests) Performing post-procedure evaluations, including interpretation of diagnostic procedures Obtaining and/or reviewing separately obtained history Performing a medically appropriate examination and/or evaluation Counseling and educating the patient/family/caregiver Ordering medications, tests, or procedures Referring and communicating with other health care professionals (when not separately reported) Documenting clinical information in the electronic or other health record Independently interpreting results (not separately reported) and communicating results to the patient/ family/caregiver Care coordination (not separately reported)  Note by: Nurse Marcelino, MD (TTS and AI technology used. I apologize for any typographical errors that were not detected and corrected.) Date: 10/02/2023; Time: 3:57 PM

## 2023-10-05 ENCOUNTER — Encounter: Payer: Self-pay | Admitting: Internal Medicine

## 2023-10-05 NOTE — Assessment & Plan Note (Signed)
Continue aciphex. No upper symptoms reported.

## 2023-10-05 NOTE — Assessment & Plan Note (Signed)
 Discussed results of recent calcium  score.  (516 - 93 percentile). Discussed risk factor modification.  Discussed starting statin medication given findings. She is hesitant given her increased msk symptoms. Discussed starting low dose to see if tolerated.  Agreeable to start low dose crestor  three days per week. If tolerates, plan to increase dose. Also instructed to take ECASA 81mg  q day. Discussed referral to cardiology for further w/up.  Agreeable for referral.

## 2023-10-05 NOTE — Assessment & Plan Note (Signed)
 Low-carb diet and exercise.  Follow met b and A1c.

## 2023-10-06 LAB — COMPLIANCE DRUG ANALYSIS, UR

## 2023-10-07 ENCOUNTER — Ambulatory Visit: Admitting: Student in an Organized Health Care Education/Training Program

## 2023-10-07 DIAGNOSIS — M5416 Radiculopathy, lumbar region: Secondary | ICD-10-CM | POA: Diagnosis not present

## 2023-10-08 ENCOUNTER — Encounter: Payer: Self-pay | Admitting: Internal Medicine

## 2023-10-09 ENCOUNTER — Encounter: Payer: Self-pay | Admitting: Internal Medicine

## 2023-10-09 DIAGNOSIS — R911 Solitary pulmonary nodule: Secondary | ICD-10-CM | POA: Insufficient documentation

## 2023-10-14 ENCOUNTER — Encounter: Payer: Self-pay | Admitting: Internal Medicine

## 2023-10-15 ENCOUNTER — Ambulatory Visit: Payer: Medicare PPO | Admitting: Dermatology

## 2023-10-15 ENCOUNTER — Ambulatory Visit: Admitting: Neurosurgery

## 2023-10-15 DIAGNOSIS — Z7189 Other specified counseling: Secondary | ICD-10-CM | POA: Diagnosis not present

## 2023-10-15 DIAGNOSIS — L409 Psoriasis, unspecified: Secondary | ICD-10-CM

## 2023-10-15 DIAGNOSIS — L988 Other specified disorders of the skin and subcutaneous tissue: Secondary | ICD-10-CM | POA: Diagnosis not present

## 2023-10-15 DIAGNOSIS — Z79899 Other long term (current) drug therapy: Secondary | ICD-10-CM

## 2023-10-15 NOTE — Progress Notes (Unsigned)
   Follow-Up Visit   Subjective  April Santos is a 68 y.o. female who presents for the following: 6 month psoriasis, feels has improved while on otezla  30 mg qd. Has had flares only on right ring finger and using vtama  cream for flares. Denies headaches, Gi issues, depression and reports no weight loss while taking medication.   Would like to discuss botox treatment and would crestor  and aspirin would cause any bruising  Would like to discuss any treatment that can be used to offset pain after fillers.   The following portions of the chart were reviewed this encounter and updated as appropriate: medications, allergies, medical history  Review of Systems:  No other skin or systemic complaints except as noted in HPI or Assessment and Plan.  Objective  Well appearing patient in no apparent distress; mood and affect are within normal limits.  A focused examination was performed of the following areas: Hands, arms, face   Relevant exam findings are noted in the Assessment and Plan.   Assessment & Plan   PSORIASIS Improved while on Otezla   Clear on exam today. 0% BSA. On Otezla    Chronic condition with duration or expected duration over one year. Currently well-controlled.   No headache, depression, weight loss or upset stomach while on Otezla .  Treatment Plan: Continue Otezla  30 mg every day Patient states she did not refills at this time. Will speak with Alan regarding refills.  Continue Vtama  daily alternating with TMC 0.1% cr as needed  Patient did not want refills at this time.   Counseling on psoriasis and coordination of care  psoriasis is a chronic non-curable, but treatable genetic/hereditary disease that may have other systemic features affecting other organ systems such as joints (Psoriatic Arthritis). It is associated with an increased risk of inflammatory bowel disease, heart disease, non-alcoholic fatty liver disease, and depression.  Treatments include light and  laser treatments; topical medications; and systemic medications including oral and injectables.   Side effects of Otezla  (apremilast ) include diarrhea, nausea, headache, upper respiratory infection, depression, and weight decrease (5-10%). It should only be taken by pregnant women after a discussion regarding risks and benefits with their doctor. Goal is control of skin condition, not cure.  The use of Otezla  requires long term medication management, including periodic office visits.   FACIAL ELASTOSIS Exam: Rhytides and volume loss. Treatment Plan: Patient concerned about bruising since started on Crestor  and daily aspirin 81 mg  Discussed continuing to take medications even when getting Botox or fillers as they should not affect botox and should not cause any significant more bruising   Patient also concerned with pain she experienced after recent fillers. Would prefer numbing cream applied at next procedure before fillers.   Discussed numbing cream before procedure and using a vibrating device could be used while administering injections  Recommend daily broad spectrum sunscreen SPF 30+ to sun-exposed areas, reapply every 2 hours as needed. Call for new or changing lesions.  Staying in the shade or wearing long sleeves, sun glasses (UVA+UVB protection) and wide brim hats (4-inch brim around the entire circumference of the hat) are also recommended for sun protection.    Return in about 6 months (around 04/16/2024) for psoriasis.  IEleanor Blush, CMA, am acting as scribe for Alm Rhyme, MD.   Documentation: I have reviewed the above documentation for accuracy and completeness, and I agree with the above.  Alm Rhyme, MD

## 2023-10-15 NOTE — Patient Instructions (Signed)

## 2023-10-16 ENCOUNTER — Encounter: Payer: Self-pay | Admitting: Obstetrics and Gynecology

## 2023-10-16 ENCOUNTER — Encounter: Payer: Self-pay | Admitting: Dermatology

## 2023-10-16 DIAGNOSIS — M5416 Radiculopathy, lumbar region: Secondary | ICD-10-CM | POA: Diagnosis not present

## 2023-10-19 ENCOUNTER — Other Ambulatory Visit: Payer: Self-pay | Admitting: Obstetrics and Gynecology

## 2023-10-19 DIAGNOSIS — N898 Other specified noninflammatory disorders of vagina: Secondary | ICD-10-CM

## 2023-10-19 DIAGNOSIS — N760 Acute vaginitis: Secondary | ICD-10-CM

## 2023-10-19 MED ORDER — DESONIDE 0.05 % EX CREA: 1.0000 | TOPICAL_CREAM | Freq: Two times a day (BID) | CUTANEOUS | 0 refills | Status: AC | PRN

## 2023-10-23 ENCOUNTER — Ambulatory Visit: Admitting: Neurosurgery

## 2023-10-23 ENCOUNTER — Encounter: Payer: Self-pay | Admitting: Neurosurgery

## 2023-10-23 VITALS — BP 122/78 | Ht 61.0 in | Wt 128.0 lb

## 2023-10-23 DIAGNOSIS — M5441 Lumbago with sciatica, right side: Secondary | ICD-10-CM | POA: Diagnosis not present

## 2023-10-23 DIAGNOSIS — M4316 Spondylolisthesis, lumbar region: Secondary | ICD-10-CM | POA: Diagnosis not present

## 2023-10-23 DIAGNOSIS — G8929 Other chronic pain: Secondary | ICD-10-CM
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
## 2023-10-24 DIAGNOSIS — M5416 Radiculopathy, lumbar region: Secondary | ICD-10-CM | POA: Diagnosis not present

## 2023-10-30 DIAGNOSIS — M51362 Other intervertebral disc degeneration, lumbar region with discogenic back pain and lower extremity pain: Secondary | ICD-10-CM | POA: Diagnosis not present

## 2023-11-04 ENCOUNTER — Ambulatory Visit (INDEPENDENT_AMBULATORY_CARE_PROVIDER_SITE_OTHER): Payer: Self-pay | Admitting: Dermatology

## 2023-11-04 DIAGNOSIS — L988 Other specified disorders of the skin and subcutaneous tissue: Secondary | ICD-10-CM

## 2023-11-04 NOTE — Patient Instructions (Signed)

## 2023-11-04 NOTE — Progress Notes (Unsigned)
   Follow-Up Visit   Subjective  April Santos is a 68 y.o. female who presents for the following: Botox for facial elastosis  The following portions of the chart were reviewed this encounter and updated as appropriate: medications, allergies, medical history  Review of Systems:  No other skin or systemic complaints except as noted in HPI or Assessment and Plan.  Objective  Well appearing patient in no apparent distress; mood and affect are within normal limits.  A focused examination was performed of the face.  Relevant physical exam findings are noted in the Assessment and Plan.  Injection map photo      Assessment & Plan    Facial Elastosis Botox 71.5 units - Frown Complex 37.5 units - Brow Lift 5  units x 2 - crows feet 10 units x 2 - upper lip - 4 units   Location: See attached image  Informed consent: Discussed risks (infection, pain, bleeding, bruising, swelling, allergic reaction, paralysis of nearby muscles, eyelid droop, double vision, neck weakness, difficulty breathing, headache, undesirable cosmetic result, and need for additional treatment) and benefits of the procedure, as well as the alternatives.  Informed consent was obtained.  Preparation: The area was cleansed with alcohol.  Procedure Details:  Botox was injected into the dermis with a 30-gauge needle. Pressure applied to any bleeding. Ice packs offered for swelling.  Lot Number:  [id number] Expiration:  08/2025  Total Units Injected:  71.5  Plan: Tylenol  may be used for headache.  Allow 2 weeks before returning to clinic for additional dosing as needed. Patient will call for any problems.  Return for 3 to 4 month botox .  IEleanor Blush, CMA, am acting as scribe for Alm Rhyme, MD.   Documentation: I have reviewed the above documentation for accuracy and completeness, and I agree with the above.  Alm Rhyme, MD

## 2023-11-05 ENCOUNTER — Encounter: Payer: Self-pay | Admitting: Dermatology

## 2023-11-06 DIAGNOSIS — M51362 Other intervertebral disc degeneration, lumbar region with discogenic back pain and lower extremity pain: Secondary | ICD-10-CM | POA: Diagnosis not present
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
## 2023-11-18 ENCOUNTER — Encounter: Payer: Self-pay | Admitting: Neurosurgery

## 2023-11-18 ENCOUNTER — Ambulatory Visit: Admitting: Neurosurgery

## 2023-11-18 VITALS — BP 128/80 | Ht 61.0 in | Wt 128.0 lb

## 2023-11-18 DIAGNOSIS — G8929 Other chronic pain: Secondary | ICD-10-CM

## 2023-11-18 DIAGNOSIS — M4316 Spondylolisthesis, lumbar region: Secondary | ICD-10-CM

## 2023-11-18 DIAGNOSIS — M5441 Lumbago with sciatica, right side: Secondary | ICD-10-CM | POA: Diagnosis not present

## 2023-11-18 NOTE — Patient Instructions (Signed)
 Please see below for information in regards to your upcoming surgery:   Planned surgery: L4-5 lateral lumbar interbody fusion (XLIF) and posterior spinal fusion   Surgery date: 12/22/23 at [facility] (Medical Mall: [address], [place], [place] [address]) - you will find out your arrival time the business day before your surgery.   Pre-op appointment at [facility] Pre-admit Testing: you will receive a call with a date/time for this appointment. If you are scheduled for an in person appointment, Pre-admit Testing is located on the first floor of the [facility] building, [address], [address]. During this appointment, they will advise you which medications you can take the morning of surgery, and which medications you will need to hold for surgery. Labs (such as blood work, EKG) may be done at your pre-op appointment. You are not required to fast for these labs. Should you need to change your pre-op appointment, please call Pre-admit testing at [phone].     Blood thinners:    Aspirin 81mg :  OK to stay on aspirin 81mg      Surgical clearance: we will send a clearance form to Dr Glendia. They may wish to see you in their office prior to signing the clearance form. If so, they may call you to schedule an appointment.     NSAIDS (Non-steroidal anti-inflammatory drugs): because you are having a fusion, please avoid taking any NSAIDS (examples: ibuprofen, motrin, aleve, naproxen, meloxicam , diclofenac) for 3 months after surgery. Celebrex is an exception and is OK to take, if prescribed. Tylenol  is not an NSAID.    Common restrictions after spine surgery: No bending, lifting, or twisting ("BLT"). Avoid lifting objects heavier than 10 pounds for the first 6 weeks after surgery. Where possible, avoid household activities that involve lifting, bending, reaching, pushing, or pulling such as laundry, vacuuming, grocery shopping, and childcare. Try  to arrange for help from friends and family for these activities while you heal. Do not drive while taking prescription pain medication. Weeks 6 through 12 after surgery: avoid lifting more than 25 pounds.    X-rays after surgery: Because you are having a fusion: for appointments after your 2 week follow-up: please arrive at the [facility] outpatient imaging center ([address], [address], [place]) or [employer] one hour prior to your appointment for x-rays. This applies to every appointment after your 2 week follow-up. Failure to do so may result in your appointment being rescheduled. *We recently started construction to have x-ray in our office. This may be completed by the time you come in for your 6 week post-op appointment. Please check with us  closer to that time to see if you can have your x-rays at our office*    How to contact us :  If you have any questions/concerns before or after surgery, you can reach us  at [phone], or you can send a mychart message. We can be reached by phone or mychart 8am-4pm, Monday-Friday.  *Please note: Calls after 4pm are forwarded to a third party answering service. Mychart messages are not routinely monitored during evenings, weekends, and holidays. Please call our office to contact the answering service for urgent concerns during non-business hours.   If you have FMLA/disability paperwork, please drop it off or fax it to [phone]   Appointments/FMLA & disability paperwork: [employer] Registered Nurse/Surgery scheduler: Yahye Siebert, RN Certified Medical Assistants: Don, CMA, Elenor, CMA, & Damien, CMA Physician Assistants: Lyle Decamp, PA-C, E[employer]el [employer]s, PA-C &  Glade Boys, PA-C Surgeons: Penne Sharps, MD & Reeves Daisy, MD   [facility] PREADMIT TESTING VISIT and SURGERY INFORMATION SHEET   Now that surgery has been scheduled you can anticipate several phone calls from [facility] services. A pharmacy technician will call you to verify your current list of medications taken at home.               The Pre-Service Center will call to verify your insurance information and to give you billing estimates and information.             The Preadmit Testing Office will be calling to schedule a visit to obtain information for the anesthesia team and provide instructions on preparation for surgery.  What can you expect for the Preadmit Testing Visit: Appointments may be scheduled in-person or by telephone.  If a telephone visit is scheduled, you may be asked to come into the office to have lab tests or other studies performed.   This visit will not be completed any greater than 14 days prior to your surgery.  If your surgery has been scheduled for a future date, please do not be alarmed if we have not contacted you to schedule an appointment more than a month prior to the surgery date.    Please be prepared to provide the following information during this appointment:            -Personal medical history                                               -Medication and allergy list            -Any history of problems with anesthesia              -Recent lab work or diagnostic studies            -Please notify us  of any needs we should be aware of to provide the best care possible           -You will be provided with instructions on how to prepare for your surgery.    On The Day of Surgery:  You must have a driver to take you home after surgery, you will be asked not to drive for 24 hours following surgery.  Taxi, Gisele and non-medical transport will not be acceptable means of transportation unless you have a responsible individual who will be traveling with you.  Visitors in the surgical area:   2 people will be able to visit you in your room once your preparation for surgery has been completed. During surgery, your visitors will be asked to wait in the Surgery Waiting Area.  It  is not a requirement for them to stay, if they prefer to leave and come back.  Your visitor(s) will be given an update once the surgery has been completed.  No visitors are allowed in the initial recovery room to respect patient privacy and safety.  Once you are more awake and transfer to the secondary recovery area, or are transferred to an inpatient room, visitors will again be able to see you.  To respect and protect your privacy: We will ask on the day of surgery who your driver will be and what the contact number for that individual will be. We will ask if it is okay to  share information with this individual, or if there is an alternative individual that we, or the surgeon, should contact to provide updates and information. If family or friends come to the surgical information desk requesting information about you, who you have not listed with us , no information will be given.   It may be helpful to designate someone as the main contact who will be responsible for updating your other friends and family.    PREADMIT TESTING OFFICE: [phone] SAME DAY SURGERY: [phone] We look forward to caring for you before and throughout the process of your surgery.

## 2023-11-19 ENCOUNTER — Other Ambulatory Visit: Payer: Self-pay

## 2023-11-19 DIAGNOSIS — Z01818 Encounter for other preprocedural examination: Secondary | ICD-10-CM

## 2023-11-19 DIAGNOSIS — G8929 Other chronic pain: Secondary | ICD-10-CM

## 2023-11-19 DIAGNOSIS — M4316 Spondylolisthesis, lumbar region: Secondary | ICD-10-CM

## 2023-11-20 ENCOUNTER — Encounter: Payer: Self-pay | Admitting: Internal Medicine

## 2023-11-20 NOTE — Telephone Encounter (Signed)
 Reviewed. Pre op appt scheduled. If she is taking and tolerting he gabapentin  - ok to ocntinue. Would recommend trying to limit amount of xanax  as she is doing. Let us  know if any change or problems.

## 2023-11-20 NOTE — Telephone Encounter (Signed)
 FYI patient has been scheduled for pre op 8/27. See the rest of her message. Confirmed she is doing ok.

## 2023-11-27 ENCOUNTER — Encounter: Payer: Self-pay | Admitting: Internal Medicine

## 2023-11-27 ENCOUNTER — Ambulatory Visit: Attending: Internal Medicine | Admitting: Internal Medicine

## 2023-11-27 VITALS — BP 124/72 | HR 59 | Ht 61.0 in | Wt 132.4 lb

## 2023-11-27 DIAGNOSIS — E785 Hyperlipidemia, unspecified: Secondary | ICD-10-CM | POA: Diagnosis not present

## 2023-11-27 DIAGNOSIS — Z01810 Encounter for preprocedural cardiovascular examination: Secondary | ICD-10-CM | POA: Diagnosis not present

## 2023-11-27 DIAGNOSIS — R931 Abnormal findings on diagnostic imaging of heart and coronary circulation: Secondary | ICD-10-CM

## 2023-11-27 DIAGNOSIS — I2510 Atherosclerotic heart disease of native coronary artery without angina pectoris: Secondary | ICD-10-CM

## 2023-11-27 NOTE — Patient Instructions (Signed)
 Medication Instructions:  Your physician recommends that you continue on your current medications as directed. Please refer to the Current Medication list given to you today.   *If you need a refill on your cardiac medications before your next appointment, please call your pharmacy*  Lab Work: None ordered at this time   Follow-Up: At [facility], you and your health needs are our priority.  As part of our continuing mission to provide you with exceptional heart care, our providers are all part of one team.  This team includes your primary Cardiologist (physician) and Advanced Practice Providers or APPs (Physician Assistants and Nurse Practitioners) who all work together to provide you with the care you need, when you need it.  Your next appointment:   3 month(s)  Provider:   You may see Dr Lonni End or one of the following Advanced Practice Providers on your designated Care Team:   Lonni Meager, NP Lesley Maffucci, PA-C Bernardino Bring, PA-C Cadence Malden, PA-C Tylene Lunch, NP Barnie Hila, NP

## 2023-11-27 NOTE — Progress Notes (Unsigned)
 Cardiology Office Note:  .   Date:  11/28/2023  ID:  OMMIE DEGEORGE, DOB 03-Nov-1955, MRN [id number] PCP: Glendia Shad, MD  [facility] Providers Cardiologist:  None     History of Present Illness: .   April Santos is a 68 y.o. female with history of coronary artery calcification, venous insufficiency, degenerative disc disease, GERD, and psoriasis, who has been referred by Dr. Glendia for evaluation of elevated coronary artery calcium  score (516 AU).  Today, April Santos reports that she has been feeling fairly well other than progressive low back and hip pain for which she is scheduled to undergo lumbar fusion next month.  Up until a year ago, she was walking 5 miles a day, but now finds it difficult to go so far because of her back and hip discomfort.  She denies having any chest pain, shortness of breath, palpitations, and she notes occasional swelling in her legs, right greater than left, which has coincided with progressive low back pain.  She underwent treatment of varicose veins through vascular surgery last year but feels like her leg swelling and discoloration has actually gotten worse since then.  April Santos denies a history of heart disease.  She underwent calcium  scoring for cardiovascular screening.  She has not had any other cardiac testing in the past.  She was started on rosuvastatin  in June by Dr. Glendia and has been tolerating this well.  She underwent left shoulder replacement last November and did not have any periprocedural complications.  ROS: See HPI  Studies Reviewed: SABRA   EKG Interpretation Date/Time:  Thursday November 27 2023 08:44:59 EDT Ventricular Rate:  59 PR Interval:  158 QRS Duration:  68 QT Interval:  388 QTC Calculation: 384 R Axis:   12  Text Interpretation: Sinus bradycardia Low voltage QRS Nonspecific T wave abnormality Abnormal ECG When compared with ECG of 25-Feb-2023 15:08, Nonspecific T wave abnormality is now Present Confirmed by Odarius Dines,  Lonni [phone]) on 11/28/2023 7:07:37 AM    Coronary calcium  score (09/24/2023): Coronary calcium  score 516 (93rd percentile for age and sex matched controls) predominantly affecting the LAD and RCA.  Aortic atherosclerosis also noted.  Risk Assessment/Calculations:             Physical Exam:   VS:  BP 124/72 (BP Location: Left Arm, Patient Position: Sitting, Cuff Size: Normal)   Pulse (!) 59   Ht 5' 1 (1.549 m)   Wt 132 lb 6.4 oz (60.1 kg)   LMP 02/26/2002   SpO2 97%   BMI 25.02 kg/m    Wt Readings from Last 3 Encounters:  11/27/23 132 lb 6.4 oz (60.1 kg)  11/18/23 128 lb (58.1 kg)  10/23/23 128 lb (58.1 kg)    General:  NAD. Neck: No JVD or HJR. Lungs: Clear to auscultation bilaterally without wheezes or crackles. Heart: Regular rate and rhythm without murmurs, rubs, or gallops. Abdomen: Soft, nontender, nondistended. Extremities: Minimal lower extremity edema noted.  ASSESSMENT AND PLAN: .    Coronary artery calcification: Coronary calcium  score high 516 (93rd percentile for age and sex matched controls).  April Santos does not have any anginal symptoms, though her functional capacity has been more limited over the last few months due to her low back/hip pain.  We discussed role for ischemia testing as well as medical therapy to prevent progression of ASCVD.  We will defer ischemia testing at this time and continue current regimen of aspirin and rosuvastatin .  April Santos is scheduled for  follow-up lipid panel with Dr. Glendia next week.  I suggest that an LP(a) also be checked, given her high calcium  score and relatively low baseline LDL.  Hyperlipidemia: Continue rosuvastatin  10 mg daily for target LDL less than 70 the setting of high coronary calcium  score.  Recommend checking LP(a) with follow-up lipid panel next week through Dr. Freda office.  Preoperative cardiovascular risk assessment: April Santos does not report angina or dyspnea.  I personally observed her climb 2  flights of stairs without any symptoms.  She is therefore able to complete more than 4 METS of activity.  Her EKG today shows nonspecific T wave changes but no significant abnormalities.  I think it is reasonable for her to proceed with her planned lumbar spine surgery; based on the ACS surgical risk calculator, her risk of perioperative cardiac complication is less than 1%.  It is reasonable to hold aspirin for 5-7 days before surgery, and for this to be resumed as soon as it is felt safe to do so by her neurosurgical team.    Dispo: Return to clinic in 3 months  Signed, Lonni Hanson, MD

## 2023-11-28 ENCOUNTER — Encounter: Payer: Self-pay | Admitting: Internal Medicine

## 2023-11-28 DIAGNOSIS — E785 Hyperlipidemia, unspecified: Secondary | ICD-10-CM | POA: Insufficient documentation

## 2023-11-28 NOTE — Progress Notes (Signed)
 Thank you for the update. She is scheduled to see me next week. I will check LP(a) during her upcoming appointment. Thank you for seeing her. I appreciate it. Hope you have a nice weekend as well.    Sahar Ryback

## 2023-12-03 ENCOUNTER — Ambulatory Visit: Admitting: Internal Medicine

## 2023-12-03 VITALS — BP 108/68 | HR 71 | Resp 16 | Ht 61.0 in | Wt 130.0 lb

## 2023-12-03 DIAGNOSIS — R931 Abnormal findings on diagnostic imaging of heart and coronary circulation: Secondary | ICD-10-CM | POA: Diagnosis not present

## 2023-12-03 DIAGNOSIS — I2510 Atherosclerotic heart disease of native coronary artery without angina pectoris: Secondary | ICD-10-CM | POA: Diagnosis not present

## 2023-12-03 DIAGNOSIS — G8929 Other chronic pain: Secondary | ICD-10-CM

## 2023-12-03 DIAGNOSIS — Z01818 Encounter for other preprocedural examination: Secondary | ICD-10-CM | POA: Diagnosis not present

## 2023-12-03 DIAGNOSIS — R911 Solitary pulmonary nodule: Secondary | ICD-10-CM | POA: Diagnosis not present

## 2023-12-03 DIAGNOSIS — K219 Gastro-esophageal reflux disease without esophagitis: Secondary | ICD-10-CM | POA: Diagnosis not present

## 2023-12-03 DIAGNOSIS — L4050 Arthropathic psoriasis, unspecified: Secondary | ICD-10-CM

## 2023-12-03 DIAGNOSIS — M5416 Radiculopathy, lumbar region: Secondary | ICD-10-CM

## 2023-12-03 DIAGNOSIS — R739 Hyperglycemia, unspecified: Secondary | ICD-10-CM | POA: Diagnosis not present

## 2023-12-03 DIAGNOSIS — F439 Reaction to severe stress, unspecified: Secondary | ICD-10-CM | POA: Diagnosis not present

## 2023-12-03 LAB — BASIC METABOLIC PANEL WITH GFR
BUN: 13 mg/dL (ref 6–23)
CO2: 30 meq/L (ref 19–32)
Calcium: 9.4 mg/dL (ref 8.4–10.5)
Chloride: 101 meq/L (ref 96–112)
Creatinine, Ser: 0.68 mg/dL (ref 0.40–1.20)
GFR: 88.82 mL/min (ref >60.00)
Glucose, Bld: 85 mg/dL (ref 70–99)
Potassium: 4.6 meq/L (ref 3.5–5.1)
Sodium: 140 meq/L (ref 135–145)

## 2023-12-03 LAB — CBC WITH DIFFERENTIAL/PLATELET
Basophils Absolute: 0 K/uL (ref 0.0–0.1)
Basophils Relative: 0.4 % (ref 0.0–3.0)
Eosinophils Absolute: 0.1 K/uL (ref 0.0–0.7)
Eosinophils Relative: 1.8 % (ref 0.0–5.0)
HCT: 38.5 % (ref 36.0–46.0)
Hemoglobin: 12.7 g/dL (ref 12.0–15.0)
Lymphocytes Relative: 38.9 % (ref 12.0–46.0)
Lymphs Abs: 1.5 K/uL (ref 0.7–4.0)
MCHC: 33 g/dL (ref 30.0–36.0)
MCV: 86.1 fl (ref 78.0–100.0)
Monocytes Absolute: 0.3 K/uL (ref 0.1–1.0)
Monocytes Relative: 9 % (ref 3.0–12.0)
Neutro Abs: 1.9 K/uL (ref 1.4–7.7)
Neutrophils Relative %: 49.9 % (ref 43.0–77.0)
Platelets: 278 K/uL (ref 150.0–400.0)
RBC: 4.47 Mil/uL (ref 3.87–5.11)
RDW: 13.4 % (ref 11.5–15.5)
WBC: 3.8 K/uL — ABNORMAL LOW (ref 4.0–10.5)

## 2023-12-03 NOTE — Progress Notes (Signed)
 Subjective:    Patient ID: April Santos, female    DOB: 10/26/1955, 68 y.o.   MRN: [id number]  Patient here for  Chief Complaint  Patient presents with   Medical Management of Chronic Issues    HPI Here for pre op evaluation. Recent screening - CT - calcium  score revealed calcium  score 516 (93 percentile). (LAD 203 and RCA 313).  S/p shoulder replacement in 02/2023. Saw GI 10/02/23 - dysphagia. Recommended EGD. GERD controlled on aciphex  20mg  bid. F/u constipation as well. Recommended mirilax. UTD on colonoscopy. Being followed by pain clnic - pain buttocks right > left. Saw neurosurgery - right > left leg pain and left quad weakness. Not improved with PT. Recommended considering surgical intervention. Follow up with Dr Clois - 11/18/23 - planning for surgery - scheduled for 12/22/23. Saw cardiology 11/27/23 - recommended to continue rosuvastatin .  Also, based on the ACS surgical risk calculator, her risk of perioperative cardiac complication is less than 1%.  They also recommended to hold aspirin for 5-7 days before surgery, and for this to be resumed as soon as it is felt safe to do so by her neurosurgical team.  No cough or congestion. No abdominal pain or bowel change.    Past Medical History:  Diagnosis Date   Arthritis    Colon polyps    COVID-19 2022   Disc disorder    Dysphagia    Family history of adverse reaction to anesthesia    mother nausea   GERD (gastroesophageal reflux disease)    History of uterine fibroid    Psoriasis    Past Surgical History:  Procedure Laterality Date   ABDOMINAL HYSTERECTOMY     BREAST SURGERY  1999   biopsy   carpal  2005   Tunnel right hand   CARPOMETACARPAL ([facility]) FUSION OF THUMB Right 11/16/2020   Procedure: Right CARPOMETACARPAL [facility]) ARTHROPLASTY;  Surgeon: Kathlynn Sharper, MD;  Location: [facility] ORS;  Service: Orthopedics;  Laterality: Right;   COLONOSCOPY WITH ESOPHAGOGASTRODUODENOSCOPY (EGD)     COLONOSCOPY WITH PROPOFOL  N/A 06/10/2016    Procedure: COLONOSCOPY WITH PROPOFOL ;  Surgeon: Lamar ONEIDA Holmes, MD;  Location: [facility] ENDOSCOPY;  Service: Endoscopy;  Laterality: N/A;   ESOPHAGOGASTRODUODENOSCOPY (EGD) WITH PROPOFOL  N/A 06/10/2016   Procedure: ESOPHAGOGASTRODUODENOSCOPY (EGD) WITH PROPOFOL ;  Surgeon: Lamar ONEIDA Holmes, MD;  Location: [facility] ENDOSCOPY;  Service: Endoscopy;  Laterality: N/A;   FOOT SURGERY  02/07/1003   right foot benign tumor   REVERSE SHOULDER ARTHROPLASTY Left 03/04/2023   Procedure: REVERSE SHOULDER ARTHROPLASTY;  Surgeon: Edie Norleen PARAS, MD;  Location: [facility] ORS;  Service: Orthopedics;  Laterality: Left;   SACROILIAC JOINT INJECTION  08/17/13 & 04/13/14   Dr. Avanell   SIGMOIDOSCOPY     UTERINE FIBROID SURGERY  1997   Family History  Problem Relation Age of Onset   Breast cancer Mother        58/74   Heart attack Father    Arthritis Father    GER disease Father    Coronary artery disease Father        39   Diabetes Brother    Diabetes Maternal Uncle    Colon cancer Neg Hx    Social History   Socioeconomic History   Marital status: Significant Other    Spouse name: Vinie Molt   Number of children: 0   Years of education: Not on file   Highest education level: Not on file  Occupational History   Not on file  Tobacco Use  Smoking status: Never   Smokeless tobacco: Never  Vaping Use   Vaping status: Never Used  Substance and Sexual Activity   Alcohol use: Not Currently    Comment: rare   Drug use: No   Sexual activity: Yes    Birth control/protection: Surgical    Comment: Hysterectomy  Other Topics Concern   Not on file  Social History Narrative   Vinie Molt - POA   Social Drivers of Health   Financial Resource Strain: Low Risk  (08/13/2023)   Received from [facility]   Overall Financial Resource Strain (CARDIA)    Difficulty of Paying Living Expenses: Not hard at all  Food Insecurity: No Food Insecurity (08/13/2023)   Received from [facility]   Hunger Vital Sign    Within the past 12 months, you worried that your food would run out before you got the money to buy more.: Never true    Within the past 12 months, the food you bought just didn't last and you didn't have money to get more.: Never true  Transportation Needs: No Transportation Needs (08/13/2023)   Received from [facility] - Transportation    In the past 12 months, has lack of transportation kept you from medical appointments or from getting medications?: No    Lack of Transportation (Non-Medical): No  Physical Activity: Sufficiently Active (01/22/2023)   Exercise Vital Sign    Days of Exercise per Week: 5 days    Minutes of Exercise per Session: 40 min  Stress: Stress Concern Present (01/22/2023)   [employer] of Occupational Health - Occupational Stress Questionnaire    Feeling of Stress : To some extent  Social Connections: Moderately Integrated (01/22/2023)   Social Connection and Isolation Panel    Frequency of Communication with Friends and Family: More than three times a week    Frequency of Social Gatherings with Friends and Family: More than three times a week    Attends Religious Services: More than 4 times per year    Active Member of [employer] or Organizations: No    Attends Banker Meetings: Never    Marital Status: Living with partner     Review of Systems  Constitutional:  Negative for appetite change and unexpected weight change.  HENT:  Negative for congestion and sinus pressure.   Respiratory:  Negative for cough, chest tightness and shortness of breath.   Cardiovascular:  Negative for chest pain, palpitations and leg swelling.  Gastrointestinal:  Negative for abdominal pain, diarrhea, nausea and vomiting.  Genitourinary:  Negative for difficulty urinating and dysuria.  Musculoskeletal:  Positive for back pain. Negative for joint swelling and myalgias.       Pain - buttocks.   Skin:  Negative  for color change and rash.  Neurological:  Negative for dizziness and headaches.  Psychiatric/Behavioral:  Negative for agitation and dysphoric mood.        Objective:     BP 108/68   Pulse 71   Resp 16   Ht 5' 1 (1.549 m)   Wt 130 lb (59 kg)   LMP 02/26/2002   SpO2 98%   BMI 24.56 kg/m  Wt Readings from Last 3 Encounters:  12/03/23 130 lb (59 kg)  11/27/23 132 lb 6.4 oz (60.1 kg)  11/18/23 128 lb (58.1 kg)    Physical Exam Vitals reviewed.  Constitutional:      General: She is not in acute distress.  Appearance: Normal appearance.  HENT:     Head: Normocephalic and atraumatic.     Right Ear: External ear normal.     Left Ear: External ear normal.     Mouth/Throat:     Pharynx: No oropharyngeal exudate or posterior oropharyngeal erythema.  Eyes:     General: No scleral icterus.       Right eye: No discharge.        Left eye: No discharge.     Conjunctiva/sclera: Conjunctivae normal.  Neck:     Thyroid : No thyromegaly.  Cardiovascular:     Rate and Rhythm: Normal rate and regular rhythm.  Pulmonary:     Effort: No respiratory distress.     Breath sounds: Normal breath sounds. No wheezing.  Abdominal:     General: Bowel sounds are normal.     Palpations: Abdomen is soft.     Tenderness: There is no abdominal tenderness.  Musculoskeletal:        General: No swelling or tenderness.     Cervical back: Neck supple. No tenderness.  Lymphadenopathy:     Cervical: No cervical adenopathy.  Skin:    Findings: No erythema or rash.  Neurological:     Mental Status: She is alert.  Psychiatric:        Mood and Affect: Mood normal.        Behavior: Behavior normal.         Outpatient Encounter Medications as of 12/03/2023  Medication Sig   ALPRAZolam  (XANAX ) 0.25 MG tablet Take 1 tablet (0.25 mg total) by mouth daily as needed.   amoxicillin (AMOXIL) 500 MG capsule Take 1,000 mg by mouth 2 (two) times daily. (Patient taking differently: Take 1,000 mg by mouth  once. Prior to dental procedures)   Apremilast  (OTEZLA ) 30 MG TABS Take 1 tablet (30 mg total) by mouth daily.   aspirin EC 81 MG tablet Take 81 mg by mouth daily. Swallow whole.   B Complex-C (B-COMPLEX WITH VITAMIN C) tablet Take 1 tablet by mouth daily.   Biotin-Vitamin C (HAIR SKIN NAILS GUMMIES PO) Take 2 tablets by mouth daily. Olly   cholecalciferol (VITAMIN D3) 25 MCG (1000 UNIT) tablet Take 1,000 Units by mouth daily.   Crisaborole  (EUCRISA ) 2 % OINT Apply 1 application topically 2 (two) times daily as needed (psoriasis).   Cyanocobalamin (VITAMIN B-12) 3000 MCG SUBL Place 6,000 mcg under the tongue daily at 2 am. Gummy   desonide  (DESOWEN ) 0.05 % cream Apply 1 Application topically 2 (two) times daily as needed (psoriasis). For 1-2 wks   estradiol  (ESTRACE  VAGINAL) 0.1 MG/GM vaginal cream Insert 1 gram vaginally 2 times weekly as maintenance   gabapentin  (NEURONTIN ) 300 MG capsule Take 300 mg by mouth as directed. One in a.m., one at noon, two at bedtime   guaiFENesin -codeine  100-10 MG/5ML syrup Take 5 mLs by mouth at bedtime as needed for cough.   HYDROcodone -acetaminophen  (NORCO/VICODIN) 5-325 MG tablet Take 1 tablet by mouth every 6 (six) hours as needed for moderate pain (pain score 4-6).   ibuprofen (ADVIL) 200 MG tablet Take 600 mg by mouth every 6 (six) hours as needed for headache.   Multiple Vitamins-Minerals (MULTIVITAMIN WITH MINERALS) tablet Take 1 tablet by mouth daily. Olly   mupirocin  ointment (BACTROBAN ) 2 % Apply 1 Application topically daily as needed (Wound).   RABEprazole  (ACIPHEX ) 20 MG tablet Take 1 tablet (20 mg total) by mouth 2 (two) times daily.   rosuvastatin  (CRESTOR ) 5 MG tablet Take 1 tablet (  5 mg total) by mouth daily.   triamcinolone  cream (KENALOG ) 0.1 % Apply 1 Application topically 2 (two) times daily as needed. For up to 2 weeks. Avoid applying to face, groin, and axilla. Use as directed. Long-term use can cause thinning of the skin.   Wheat Dextrin  (BENEFIBER) CHEW Chew 2 tablets by mouth every morning. Gummy   No facility-administered encounter medications on file as of 12/03/2023.     Lab Results  Component Value Date   WBC 3.8 (L) 12/03/2023   HGB 12.7 12/03/2023   HCT 38.5 12/03/2023   PLT 278.0 12/03/2023   GLUCOSE 85 12/03/2023   CHOL 167 09/12/2023   TRIG 75.0 09/12/2023   HDL 62.60 09/12/2023   LDLCALC 89 09/12/2023   ALT 11 09/12/2023   AST 16 09/12/2023   NA 140 12/03/2023   K 4.6 12/03/2023   CL 101 12/03/2023   CREATININE 0.68 12/03/2023   BUN 13 12/03/2023   CO2 30 12/03/2023   TSH 1.08 05/01/2023   HGBA1C 5.5 09/12/2023    CT CARDIAC SCORING (SELF PAY ONLY) Addendum Date: 10/04/2023 ADDENDUM REPORT: 10/04/2023 22:06 EXAM: OVER-READ INTERPRETATION  CT CHEST The following report is an over-read performed by radiologist Dr. Andrea Gasman of [facility] on 10/04/2023. This over-read does not include interpretation of cardiac or coronary anatomy or pathology. The coronary calcium  score interpretation by the cardiologist is attached. COMPARISON:  None. FINDINGS: Vascular: Aortic atherosclerosis. The included aorta is normal in caliber. Mediastinum/nodes: No adenopathy or mass. Unremarkable esophagus. Lungs: No focal airspace disease. Tiny pulmonary nodules noted perifissural intrapulmonary lymph nodes, need no further imaging follow-up. No pleural fluid. The included airways are patent. Upper abdomen: No acute or unexpected findings. Musculoskeletal: There are no acute or suspicious osseous abnormalities. IMPRESSION: Aortic Atherosclerosis (ICD10-I70.0). Electronically Signed   By: Andrea Gasman M.D.   On: 10/04/2023 22:06   Result Date: 10/04/2023 CLINICAL DATA:  Cardiovascular Disease Risk stratification EXAM: Coronary Calcium  Score TECHNIQUE: A gated, non-contrast computed tomography scan of the heart was performed using 3mm slice thickness. Axial images were analyzed on a dedicated workstation. Calcium   scoring of the coronary arteries was performed using the Agatston method. FINDINGS: Coronary Calcium  Score: Left main: 0 Left anterior descending artery: 203 Left circumflex artery: 0 Right coronary artery: 313 Total: 516 Percentile: 93 Pericardium: Normal. Ascending Aorta: Normal caliber.  Aortic atherosclerosis. Non-cardiac: See separate report from [facility]. IMPRESSION: Coronary calcium  score of 516. This was 70 percentile for age-, race-, and sex-matched controls. Aortic atherosclerosis. RECOMMENDATIONS: Coronary artery calcium  (CAC) score is a strong predictor of incident coronary heart disease (CHD) and provides predictive information beyond traditional risk factors. CAC scoring is reasonable to use in the decision to withhold, postpone, or initiate statin therapy in intermediate-risk or selected borderline-risk asymptomatic adults (age 35-75 years and LDL-C >=70 to <190 mg/dL) who do not have diabetes or established atherosclerotic cardiovascular disease (ASCVD).* In intermediate-risk (10-year ASCVD risk >=7.5% to <20%) adults or selected borderline-risk (10-year ASCVD risk >=5% to <7.5%) adults in whom a CAC score is measured for the purpose of making a treatment decision the following recommendations have been made: If CAC=0, it is reasonable to withhold statin therapy and reassess in 5 to 10 years, as long as higher risk conditions are absent (diabetes mellitus, family history of premature CHD in first degree relatives (males <55 years; females <65 years), cigarette smoking, or LDL >=190 mg/dL). If CAC is 1 to 99, it is reasonable to initiate statin therapy  for patients >=52 years of age. If CAC is >=100 or >=75th percentile, it is reasonable to initiate statin therapy at any age. Cardiology referral should be considered for patients with CAC scores >=400 or >=75th percentile. *2018 AHA/ACC/AACVPR/AAPA/ABC/ACPM/ADA/AGS/APhA/ASPC/NLA/PCNA Guideline on the Management of Blood Cholesterol: A  Report of the American College of Cardiology/American Heart Association Task Force on Clinical Practice Guidelines. J Am Coll Cardiol. 2019;73(24):3168-3209. Redell Shallow, MD Electronically Signed: By: Redell Shallow M.D. On: 09/24/2023 16:21       Assessment & Plan:  Coronary artery disease involving native coronary artery of native heart without angina pectoris Assessment & Plan:  Recent screening - CT - calcium  score revealed calcium  score 516 (93 percentile). (LAD 203 and RCA 313).  Saw cardiology 11/27/23 - recommended to continue rosuvastatin .  Also, based on the ACS surgical risk calculator, her risk of perioperative cardiac complication is less than 1%.  They also recommended to hold aspirin for 5-7 days before surgery, and for this to be resumed as soon as it is felt safe to do so by her neurosurgical team.   Orders: -     Lipoprotein A (LPA) -     CBC with Differential/Platelet -     Basic metabolic panel with GFR  Chronic radicular lumbar pain Assessment & Plan: Being followed by pain clnic - pain buttocks right > left. Saw neurosurgery - right > left leg pain and left quad weakness. Not improved with PT. Recommended considering surgical intervention. Follow up with Dr Clois - 11/18/23 - planning for surgery - scheduled for 12/22/23.   Elevated coronary artery calcium  score Assessment & Plan: Being followed by pain clnic - pain buttocks right > left. Saw neurosurgery - right > left leg pain and left quad weakness. Not improved with PT. Recommended considering surgical intervention. Follow up with Dr Clois - 11/18/23 - planning for surgery - scheduled for 12/22/23. Recommended to draw LP(a) with next labs. Discussed. Draw today and forward to cardiology.    Gastroesophageal reflux disease without esophagitis Assessment & Plan: No upper symptoms reported. On aciphex .    Hyperglycemia Assessment & Plan: Low carb diet and exercise. Follow met b and A1c.    Lung  nodule Assessment & Plan: Reviewed cardiac CT with pulmonary - The nodule is showing multiple characteristics that suggest an perifissural lymph node. It's quite small, but unfortunately the images stop half way through the nodule (cannot completely visualize it). If she is low risk I would feel comfortable not repeating a CT. I do have a low threshold to getting a dedicated chest CT at the one year mark for any patient (especially elderly) with any type of risk (personal history of smoking, significant second hand smoke exposure, occupational exposures, significant time spent commuting, [employer] work, [employer] representative work, family history, etc...). It would allow for a one year surveillance of the nodule and better visualization of the whole lung. Recommend f/u one year after cardiac scan.    Psoriatic arthritis (HCC) Assessment & Plan: Followed by dermatology. Continue otezla .    Stress Assessment & Plan: Increased stress related to current pain and health issues. Discussed. Notify me if feels need further intervention.    Pre-op evaluation Assessment & Plan: See cardiology recommendation. Saw cardiology 11/27/23 - recommended to continue rosuvastatin .  Also, based on the ACS surgical risk calculator, her risk of perioperative cardiac complication is less than 1%.  They also recommended to hold aspirin for 5-7 days before surgery, and for this to be resumed as soon  as it is felt safe to do so by her neurosurgical team.  Breathing stable. No sob. Follow pressures to avoid extremes.       Allena Hamilton, MD

## 2023-12-04 ENCOUNTER — Encounter: Payer: Self-pay | Admitting: Internal Medicine

## 2023-12-05 ENCOUNTER — Ambulatory Visit: Payer: Self-pay | Admitting: Internal Medicine

## 2023-12-05 DIAGNOSIS — I2510 Atherosclerotic heart disease of native coronary artery without angina pectoris: Secondary | ICD-10-CM

## 2023-12-06 LAB — LIPOPROTEIN A (LPA): Lipoprotein (a): 194 nmol/L — ABNORMAL HIGH (ref <75)

## 2023-12-08 ENCOUNTER — Encounter: Payer: Self-pay | Admitting: Internal Medicine

## 2023-12-08 DIAGNOSIS — Z01818 Encounter for other preprocedural examination: Secondary | ICD-10-CM | POA: Insufficient documentation

## 2023-12-08 NOTE — Assessment & Plan Note (Signed)
 Followed by dermatology. Continue otezla .

## 2023-12-08 NOTE — Assessment & Plan Note (Addendum)
 See cardiology recommendation. Saw cardiology 11/27/23 - recommended to continue rosuvastatin .  Also, based on the ACS surgical risk calculator, her risk of perioperative cardiac complication is less than 1%.  They also recommended to hold aspirin for 5-7 days before surgery, and for this to be resumed as soon as it is felt safe to do so by her neurosurgical team.  Breathing stable. No sob. Follow pressures to avoid extremes.

## 2023-12-08 NOTE — Assessment & Plan Note (Signed)
 Low-carb diet and exercise.  Follow met b and A1c.

## 2023-12-08 NOTE — Assessment & Plan Note (Signed)
 Being followed by pain clnic - pain buttocks right > left. Saw neurosurgery - right > left leg pain and left quad weakness. Not improved with PT. Recommended considering surgical intervention. Follow up with Dr Clois - 11/18/23 - planning for surgery - scheduled for 12/22/23. Recommended to draw LP(a) with next labs. Discussed. Draw today and forward to cardiology.

## 2023-12-08 NOTE — Assessment & Plan Note (Signed)
 Increased stress related to current pain and health issues. Discussed. Notify me if feels need further intervention.

## 2023-12-08 NOTE — Assessment & Plan Note (Signed)
 Reviewed cardiac CT with pulmonary - The nodule is showing multiple characteristics that suggest an perifissural lymph node. It's quite small, but unfortunately the images stop half way through the nodule (cannot completely visualize it). If she is low risk I would feel comfortable not repeating a CT. I do have a low threshold to getting a dedicated chest CT at the one year mark for any patient (especially elderly) with any type of risk (personal history of smoking, significant second hand smoke exposure, occupational exposures, significant time spent commuting, [employer] work, [employer] representative work, family history, etc...). It would allow for a one year surveillance of the nodule and better visualization of the whole lung. Recommend f/u one year after cardiac scan.

## 2023-12-08 NOTE — Assessment & Plan Note (Signed)
No upper symptoms reported.  On aciphex.   

## 2023-12-08 NOTE — Assessment & Plan Note (Signed)
 Recent screening - CT - calcium  score revealed calcium  score 516 (93 percentile). (LAD 203 and RCA 313).  Saw cardiology 11/27/23 - recommended to continue rosuvastatin .  Also, based on the ACS surgical risk calculator, her risk of perioperative cardiac complication is less than 1%.  They also recommended to hold aspirin for 5-7 days before surgery, and for this to be resumed as soon as it is felt safe to do so by her neurosurgical team.

## 2023-12-08 NOTE — Assessment & Plan Note (Signed)
 Being followed by pain clnic - pain buttocks right > left. Saw neurosurgery - right > left leg pain and left quad weakness. Not improved with PT. Recommended considering surgical intervention. Follow up with Dr Clois - 11/18/23 - planning for surgery - scheduled for 12/22/23.

## 2023-12-09 ENCOUNTER — Encounter: Payer: Self-pay | Admitting: Internal Medicine

## 2023-12-09 ENCOUNTER — Ambulatory Visit: Attending: Nurse Practitioner | Admitting: Nurse Practitioner

## 2023-12-09 ENCOUNTER — Encounter: Payer: Self-pay | Admitting: Nurse Practitioner

## 2023-12-09 VITALS — BP 120/84 | HR 68 | Temp 97.7°F | Resp 18 | Ht 61.0 in | Wt 130.0 lb

## 2023-12-09 DIAGNOSIS — G8929 Other chronic pain: Secondary | ICD-10-CM | POA: Insufficient documentation

## 2023-12-09 DIAGNOSIS — M48062 Spinal stenosis, lumbar region with neurogenic claudication: Secondary | ICD-10-CM | POA: Diagnosis not present

## 2023-12-09 DIAGNOSIS — M5416 Radiculopathy, lumbar region: Secondary | ICD-10-CM | POA: Insufficient documentation

## 2023-12-09 DIAGNOSIS — M4726 Other spondylosis with radiculopathy, lumbar region: Secondary | ICD-10-CM | POA: Diagnosis not present

## 2023-12-09 DIAGNOSIS — M48061 Spinal stenosis, lumbar region without neurogenic claudication: Secondary | ICD-10-CM | POA: Insufficient documentation

## 2023-12-09 DIAGNOSIS — M47816 Spondylosis without myelopathy or radiculopathy, lumbar region: Secondary | ICD-10-CM | POA: Insufficient documentation

## 2023-12-09 DIAGNOSIS — G894 Chronic pain syndrome: Secondary | ICD-10-CM | POA: Insufficient documentation

## 2023-12-09 MED ORDER — HYDROCODONE-ACETAMINOPHEN 5-325 MG PO TABS: 1.0000 | ORAL_TABLET | Freq: Two times a day (BID) | ORAL | 0 refills | Status: DC | PRN

## 2023-12-09 NOTE — Progress Notes (Signed)
 PROVIDER NOTE: Interpretation of information contained herein should be left to medically-trained personnel. Specific patient instructions are provided elsewhere under Patient Instructions section of medical record. This document was created in part using AI and STT-dictation technology, any transcriptional errors that may result from this process are unintentional.  Patient: April Santos  Service: E/M   PCP: Glendia Shad, MD  DOB: 08/04/1955  DOS: 12/09/2023  Provider: Emmy MARLA Blanch, NP  MRN: [id number]  Delivery: Face-to-face  Specialty: Interventional Pain Management  Type: Established Patient  Setting: Ambulatory outpatient facility  Specialty designation: 09  Referring Prov.: Glendia Shad, MD  Location: Outpatient office facility       History of present illness (HPI) [name], a 68 y.o. year old female, is here today because of her Chronic radicular lumbar pain [M54.16, G89.29]. April Santos's primary complain today is Hip Pain (Radiates to bilateral thighs )  Pertinent problems: April Santos has Left shoulder pain; Psoriatic arthritis (HCC); Right hand pain; Osteopenia; Toenail fungus; Right knee pain; Stress; Leg pain, left; Generalized osteoarthritis of hand; HNP (herniated nucleus pulposus), lumbar; Chronic radicular lumbar pain; Osteoarthritis of knee; Sacroiliitis (HCC); Coccyx pain; and Chronic pain Syndrome on their pertinent problem list.   Pain Assessment: Severity of Chronic pain is reported as a 8 /10. Location: Hip Right/Radiates down into bilateral hip and into bilateral thighs. Onset: More than a month ago. Quality: Aching, Throbbing. Timing: Constant. Modifying factor(s): Rest. Vitals:  height is 5' 1 (1.549 m) and weight is 130 lb (59 kg). Her temporal temperature is 97.7 F (36.5 C). Her blood pressure is 120/84 and her pulse is 68. Her respiration is 18 and oxygen saturation is 99%.  BMI: Estimated body mass index is 24.56 kg/m as calculated from the  following:   Height as of this encounter: 5' 1 (1.549 m).   Weight as of this encounter: 130 lb (59 kg).  Last encounter: 10/02/2023 Last procedure: Visit date not found.  Reason for encounter: medication management. No change in medical history since last visit.  Patient's pain is at baseline.  Patient continues multimodal pain regimen as prescribed.  States that it provides pain relief and improvement in functional status.  The patient continues to experience low back pain that radiates primarily to her right leg, with occasional radiation to the left leg.  She has surgery scheduled with Dr. Katrina on December 22, 2023.  Pharmacotherapy Assessment   Hydrocodone  acetaminophen  (Norco) 5-25 mg tablet every 12 hours as needed for pain for up to 30 days. MME=10 Monitoring: [place] PMP: PDMP reviewed during this encounter.       Pharmacotherapy: No side-effects or adverse reactions reported. Compliance: No problems identified. Effectiveness: Clinically acceptable.  Erlene Doyal SAUN[place], [place]  12/09/2023  1:20 PM  Sign when Signing Visit Nursing Pain Medication Assessment:  Safety precautions to be maintained throughout the outpatient stay will include: orient to surroundings, keep bed in low position, maintain call bell within reach at all times, provide assistance with transfer out of bed and ambulation.  Medication Inspection Compliance: Pill count conducted under aseptic conditions, in front of the patient. Neither the pills nor the bottle was removed from the patient's sight at any time. Once count was completed pills were immediately returned to the patient in their original bottle.  Medication: Hydrocodone /APAP Pill/Patch Count: 06 of 60 pills/patches remain Pill/Patch Appearance: Markings consistent with prescribed medication Bottle Appearance: Standard pharmacy container. Clearly labeled. Filled Date: 06 / 25 / 2025 Last Medication intake:  Today    UDS:  Summary  Date Value Ref Range  Status  10/02/2023 FINAL  Final    Comment:    ==================================================================== Compliance Drug Analysis, Ur ==================================================================== Test                             Result       Flag       Units  Drug Present and Declared for Prescription Verification   Gabapentin                      PRESENT      EXPECTED  Drug Present not Declared for Prescription Verification   Oxymorphone                    311          UNEXPECTED ng/mg creat    Sources of oxymorphone include scheduled prescription medications;    it is also an expected metabolite of oxycodone .  Drug Absent but Declared for Prescription Verification   Alprazolam                      Not Detected UNEXPECTED ng/mg creat   Codeine                         Not Detected UNEXPECTED ng/mg creat   Hydrocodone                     Not Detected UNEXPECTED ng/mg creat   Acetaminophen                   Not Detected UNEXPECTED    Acetaminophen , as indicated in the declared medication list, is not    always detected even when used as directed.    Salicylate                     Not Detected UNEXPECTED    Aspirin, as indicated in the declared medication list, is not always    detected even when used as directed.    Ibuprofen                      Not Detected UNEXPECTED    Ibuprofen, as indicated in the declared medication list, is not    always detected even when used as directed.    Guaifenesin                     Not Detected UNEXPECTED ==================================================================== Test                      Result    Flag   Units      Ref Range   Creatinine              75               mg/dL      >=79 ==================================================================== Declared Medications:  The flagging and interpretation on this report are based on the  following declared medications.  Unexpected results may arise from  inaccuracies in  the declared medications.   **Note: The testing scope of this panel includes these medications:   Alprazolam  (Xanax )  Codeine   Gabapentin   Guaifenesin   Hydrocodone  (Norco)   **Note: The testing scope of this panel does not include small to  moderate amounts of these reported medications:  Acetaminophen  (Norco)  Aspirin  Ibuprofen (Advil)   **Note: The testing scope of this panel does not include the  following reported medications:   Apremilast  (Otezla )  Crisaborole  (Eucrisa )  Desonide  (Desowen )  Estradiol  (Estrace )  Rabeprazole  (Aciphex )  Rosuvastatin  (Crestor )  Sulfadiazine  (Silvadene )  Supplement  Triamcinolone  (Kenalog )  Vitamin B  Vitamin B12  Vitamin C  Vitamin D3 ==================================================================== For clinical consultation, please call [phone]. ====================================================================     No results found for: CBDTHCR No results found for: D8THCCBX No results found for: D9THCCBX  ROS  Constitutional: Denies any fever or chills Gastrointestinal: No reported hemesis, hematochezia, vomiting, or acute GI distress Musculoskeletal: Denies any acute onset joint swelling, redness, loss of ROM, or weakness Neurological: No reported episodes of acute onset apraxia, aphasia, dysarthria, agnosia, amnesia, paralysis, loss of coordination, or loss of consciousness  Medication Review  ALPRAZolam , Apremilast , B-complex with vitamin C, Benefiber, Biotin-Vitamin C, Crisaborole , HYDROcodone -acetaminophen , RABEprazole , Vitamin B-12, amoxicillin, aspirin EC, cholecalciferol, desonide , estradiol , gabapentin , guaiFENesin -codeine , hydrocortisone cream, ibuprofen, multivitamin with minerals, mupirocin  ointment, rosuvastatin , and triamcinolone  cream  History Review  Allergy: April Santos is allergic to cefdinir and erythromycin ethylsuccinate [erythromycin]. Drug: April Santos  reports no history of drug  use. Alcohol:  reports that she does not currently use alcohol. Tobacco:  reports that she has never smoked. She has never used smokeless tobacco. Social: April Santos  reports that she has never smoked. She has never used smokeless tobacco. She reports that she does not currently use alcohol. She reports that she does not use drugs. Medical:  has a past medical history of Arthritis, Colon polyps, COVID-19 (2022), Disc disorder, Dysphagia, Family history of adverse reaction to anesthesia, GERD (gastroesophageal reflux disease), History of uterine fibroid, and Psoriasis. Surgical: April Santos  has a past surgical history that includes Uterine fibroid surgery (1997); Breast surgery (1999); Foot surgery (02/07/1003); carpal (2005); Sacroiliac joint injection (08/17/13 & 04/13/14); Colonoscopy with esophagogastroduodenoscopy (egd); Abdominal hysterectomy; Sigmoidoscopy; Colonoscopy with propofol  (N/A, 06/10/2016); Esophagogastroduodenoscopy (egd) with propofol  (N/A, 06/10/2016); Carpometacarpal (cmc) fusion of thumb (Right, 11/16/2020); and Reverse shoulder arthroplasty (Left, 03/04/2023). Family: family history includes Arthritis in her father; Breast cancer in her mother; Coronary artery disease in her father; Diabetes in her brother and maternal uncle; GER disease in her father; Heart attack in her father.  Laboratory Chemistry Profile   Renal Lab Results  Component Value Date   BUN 13 12/03/2023   CREATININE 0.68 12/03/2023   GFR 88.82 12/03/2023   GFRNONAA >60 02/25/2023    Hepatic Lab Results  Component Value Date   AST 16 09/12/2023   ALT 11 09/12/2023   ALBUMIN 4.1 09/12/2023   ALKPHOS 81 09/12/2023    Electrolytes Lab Results  Component Value Date   NA 140 12/03/2023   K 4.6 12/03/2023   CL 101 12/03/2023   CALCIUM  9.4 12/03/2023    Bone Lab Results  Component Value Date   VD25OH 50.93 05/01/2023    Inflammation (CRP: Acute Phase) (ESR: Chronic Phase) Lab Results  Component Value  Date   CRP 0.2 (L) 06/03/2016   ESRSEDRATE 3 06/03/2016         Note: Above Lab results reviewed.  Recent Imaging Review  CT CARDIAC SCORING (SELF PAY ONLY) Addendum: ADDENDUM REPORT: 10/04/2023 22:06   EXAM:  OVER-READ INTERPRETATION  CT CHEST   The following report is an over-read performed by radiologist Dr.  Andrea Gasman of [facility] on 10/04/2023. This  over-read does not include interpretation of cardiac or coronary  anatomy or pathology. The coronary calcium  score interpretation by  the cardiologist is attached.   COMPARISON:  None.   FINDINGS:  Vascular: Aortic atherosclerosis. The included aorta is normal in  caliber.   Mediastinum/nodes: No adenopathy or mass. Unremarkable esophagus.   Lungs: No focal airspace disease. Tiny pulmonary nodules noted  perifissural intrapulmonary lymph nodes, need no further imaging  follow-up. No pleural fluid. The included airways are patent.   Upper abdomen: No acute or unexpected findings.   Musculoskeletal: There are no acute or suspicious osseous  abnormalities.   IMPRESSION:  Aortic Atherosclerosis (ICD10-I70.0).   Electronically Signed    By: Andrea Gasman M.D.    On: 10/04/2023 22:06 Narrative: CLINICAL DATA:  Cardiovascular Disease Risk stratification  EXAM: Coronary Calcium  Score  TECHNIQUE: A gated, non-contrast computed tomography scan of the heart was performed using 3mm slice thickness. Axial images were analyzed on a dedicated workstation. Calcium  scoring of the coronary arteries was performed using the Agatston method.  FINDINGS: Coronary Calcium  Score:  Left main: 0  Left anterior descending artery: 203  Left circumflex artery: 0  Right coronary artery: 313  Total: 516  Percentile: 93  Pericardium: Normal.  Ascending Aorta: Normal caliber.  Aortic atherosclerosis.  Non-cardiac: See separate report from [facility].  IMPRESSION: Coronary calcium  score of  516. This was 42 percentile for age-, race-, and sex-matched controls.  Aortic atherosclerosis.  RECOMMENDATIONS: Coronary artery calcium  (CAC) score is a strong predictor of incident coronary heart disease (CHD) and provides predictive information beyond traditional risk factors. CAC scoring is reasonable to use in the decision to withhold, postpone, or initiate statin therapy in intermediate-risk or selected borderline-risk asymptomatic adults (age 58-75 years and LDL-C >=70 to <190 mg/dL) who do not have diabetes or established atherosclerotic cardiovascular disease (ASCVD).* In intermediate-risk (10-year ASCVD risk >=7.5% to <20%) adults or selected borderline-risk (10-year ASCVD risk >=5% to <7.5%) adults in whom a CAC score is measured for the purpose of making a treatment decision the following recommendations have been made:  If CAC=0, it is reasonable to withhold statin therapy and reassess in 5 to 10 years, as long as higher risk conditions are absent (diabetes mellitus, family history of premature CHD in first degree relatives (males <55 years; females <65 years), cigarette smoking, or LDL >=190 mg/dL).  If CAC is 1 to 99, it is reasonable to initiate statin therapy for patients >=69 years of age.  If CAC is >=100 or >=75th percentile, it is reasonable to initiate statin therapy at any age.  Cardiology referral should be considered for patients with CAC scores >=400 or >=75th percentile.  *2018 AHA/ACC/AACVPR/AAPA/ABC/ACPM/ADA/AGS/APhA/ASPC/NLA/PCNA Guideline on the Management of Blood Cholesterol: A Report of the American College of Cardiology/American Heart Association Task Force on Clinical Practice Guidelines. J Am Coll Cardiol. 2019;73(24):3168-3209.  Redell Shallow, MD  Electronically Signed: By: Redell Shallow M.D. On: 09/24/2023 16:21 Note: Reviewed        Physical Exam  Vitals: BP 120/84 (BP Location: Right Arm)   Pulse 68   Temp 97.7 F (36.5  C) (Temporal)   Resp 18   Ht 5' 1 (1.549 m)   Wt 130 lb (59 kg)   LMP 02/26/2002   SpO2 99%   BMI 24.56 kg/m  BMI: Estimated body mass index is 24.56 kg/m as calculated from the following:   Height as of this encounter: 5' 1 (1.549 m).   Weight as of this encounter: 130 lb (59 kg). Ideal: Ideal body weight: 47.8 kg (  105 lb 6.1 oz) Adjusted ideal body weight: 52.3 kg (115 lb 3.7 oz) General appearance: Well nourished, well developed, and well hydrated. In no apparent acute distress Mental status: Alert, oriented x 3 (person, place, & time)       Respiratory: No evidence of acute respiratory distress Eyes: PERLA   Assessment   Diagnosis Status  1. Chronic radicular lumbar pain   2. Lumbar facet arthropathy   3. Neuroforaminal stenosis of lumbar spine   4. Lumbar radiculopathy   5. Spinal stenosis, lumbar region, with neurogenic claudication   6. Chronic pain syndrome    Controlled Controlled Controlled   Updated Problems: No problems updated.  Plan of Care  Problem-specific:  Assessment and Plan We will continue on current medication regimen.  Prescribing drug monitoring (PDMP) reviewed; findings consistent with the use of prescribed medication and no evidence of narcotic misuse or abuse.  Urine drug screening (UDS) up-to-date.  Schedule follow-up in 30 days for medication management with Mylez Venable, NP.   April Santos has a current medication list which includes the following long-term medication(s): rabeprazole  and rosuvastatin .  Pharmacotherapy (Medications Ordered): Meds ordered this encounter  Medications   HYDROcodone -acetaminophen  (NORCO/VICODIN) 5-325 MG tablet    Sig: Take 1 tablet by mouth every 12 (twelve) hours as needed for moderate pain (pain score 4-6).    Dispense:  30 tablet    Refill:  0   Orders:  No orders of the defined types were placed in this encounter.     Return in about 1 month (around 01/08/2024) for (F2F), (MM), Emmy Blanch  NP.    Recent Visits Date Type Provider Dept  10/02/23 Office Visit Marcelino Nurse, MD [facility]  Showing recent visits within past 90 days and meeting all other requirements Today's Visits Date Type Provider Dept  12/09/23 Office Visit Keylon Labelle K, NP [facility]  Showing today's visits and meeting all other requirements Future Appointments Date Type Provider Dept  01/08/24 Appointment Safiya Girdler K, NP [facility]  Showing future appointments within next 90 days and meeting all other requirements  I discussed the assessment and treatment plan with the patient. The patient was provided an opportunity to ask questions and all were answered. The patient agreed with the plan and demonstrated an understanding of the instructions.  Patient advised to call back or seek an in-person evaluation if the symptoms or condition worsens.  Duration of encounter: 30 minutes.  Total time on encounter, as per AMA guidelines included both the face-to-face and non-face-to-face time personally spent by the physician and/or other qualified health care professional(s) on the day of the encounter (includes time in activities that require the physician or other qualified health care professional and does not include time in activities normally performed by clinical staff). Physician's time may include the following activities when performed: Preparing to see the patient (e.g., pre-charting review of records, searching for previously ordered imaging, lab work, and nerve conduction tests) Review of prior analgesic pharmacotherapies. Reviewing PMP Interpreting ordered tests (e.g., lab work, imaging, nerve conduction tests) Performing post-procedure evaluations, including interpretation of diagnostic procedures Obtaining and/or reviewing separately obtained history Performing a medically appropriate examination and/or evaluation Counseling and educating the  patient/family/caregiver Ordering medications, tests, or procedures Referring and communicating with other health care professionals (when not separately reported) Documenting clinical information in the electronic or other health record Independently interpreting results (not separately reported) and communicating results to the patient/ family/caregiver Care coordination (not separately  reported)  Note by: Dannie Hattabaugh K Naser Schuld, NP (TTS and AI technology used. I apologize for any typographical errors that were not detected and corrected.) Date: 12/09/2023; Time: 2:07 PM

## 2023-12-09 NOTE — Progress Notes (Signed)
 Nursing Pain Medication Assessment:  Safety precautions to be maintained throughout the outpatient stay will include: orient to surroundings, keep bed in low position, maintain call bell within reach at all times, provide assistance with transfer out of bed and ambulation.  Medication Inspection Compliance: Pill count conducted under aseptic conditions, in front of the patient. Neither the pills nor the bottle was removed from the patient's sight at any time. Once count was completed pills were immediately returned to the patient in their original bottle.  Medication: Hydrocodone /APAP Pill/Patch Count: 06 of 60 pills/patches remain Pill/Patch Appearance: Markings consistent with prescribed medication Bottle Appearance: Standard pharmacy container. Clearly labeled. Filled Date: 06 / 25 / 2025 Last Medication intake:  Today

## 2023-12-10 ENCOUNTER — Encounter
Admission: RE | Admit: 2023-12-10 | Discharge: 2023-12-10 | Disposition: A | Discharge status: Home / Self Care | Source: Ambulatory Visit | Attending: Neurosurgery | Admitting: Neurosurgery

## 2023-12-10 ENCOUNTER — Other Ambulatory Visit: Payer: Self-pay

## 2023-12-10 VITALS — BP 120/80 | HR 68 | Resp 14 | Ht 61.0 in | Wt 133.6 lb

## 2023-12-10 DIAGNOSIS — Z01812 Encounter for preprocedural laboratory examination: Secondary | ICD-10-CM | POA: Insufficient documentation

## 2023-12-10 DIAGNOSIS — G8929 Other chronic pain: Secondary | ICD-10-CM | POA: Insufficient documentation

## 2023-12-10 DIAGNOSIS — M4316 Spondylolisthesis, lumbar region: Secondary | ICD-10-CM | POA: Insufficient documentation

## 2023-12-10 DIAGNOSIS — M5441 Lumbago with sciatica, right side: Secondary | ICD-10-CM | POA: Insufficient documentation

## 2023-12-10 DIAGNOSIS — Z01818 Encounter for other preprocedural examination: Secondary | ICD-10-CM

## 2023-12-10 HISTORY — DX: Atherosclerosis of aorta: I70.0

## 2023-12-10 HISTORY — DX: Vitamin D deficiency, unspecified: E55.9

## 2023-12-10 HISTORY — DX: Hyperlipidemia, unspecified: E78.5

## 2023-12-10 HISTORY — DX: Spinal stenosis, lumbar region without neurogenic claudication: M48.061

## 2023-12-10 HISTORY — DX: Arthropathic psoriasis, unspecified: L40.50

## 2023-12-10 HISTORY — DX: Solitary pulmonary nodule: R91.1

## 2023-12-10 HISTORY — DX: Unspecified osteoarthritis, unspecified site: M19.90

## 2023-12-10 HISTORY — DX: Asymptomatic varicose veins of unspecified lower extremity: I83.90

## 2023-12-10 HISTORY — DX: Atherosclerotic heart disease of native coronary artery without angina pectoris: I25.10

## 2023-12-10 HISTORY — DX: Abnormal findings on diagnostic imaging of heart and coronary circulation: R93.1

## 2023-12-10 LAB — URINALYSIS, COMPLETE (UACMP) WITH MICROSCOPIC
Bilirubin Urine: NEGATIVE
Glucose, UA: NEGATIVE mg/dL
Hgb urine dipstick: NEGATIVE
Ketones, ur: NEGATIVE mg/dL
Leukocytes,Ua: NEGATIVE
Nitrite: NEGATIVE
Protein, ur: NEGATIVE mg/dL
RBC / HPF: 0 RBC/hpf (ref 0–5)
Specific Gravity, Urine: 1.012 (ref 1.005–1.030)
pH: 7 (ref 5.0–8.0)

## 2023-12-10 LAB — TYPE AND SCREEN
ABO/RH(D): A POS
Antibody Screen: NEGATIVE

## 2023-12-10 LAB — SURGICAL PCR SCREEN
MRSA, PCR: NEGATIVE
Staphylococcus aureus: NEGATIVE

## 2023-12-10 NOTE — Telephone Encounter (Signed)
 Reviewed message. Regarding her white blood cell count, this can vary for various reasons, viral infections, etc. This can also be a normal variant. Just need to recheck to confirm stable/normal. I sent the LP(a) to Dr End. (When elevated - increased risk of heart disease, etc). We are addressing and she is seeing cardiology.

## 2023-12-10 NOTE — Patient Instructions (Signed)
 How to Use an Incentive Spirometer An incentive spirometer is a tool that measures how well you are filling your lungs with each breath. Learning to take long, deep breaths using this tool can help you keep your lungs clear and active. This may help to reverse or lessen your chance of developing breathing (pulmonary) problems, especially infection. You may be asked to use a spirometer: After a surgery. If you have a lung problem or a history of smoking. After a long period of time when you have been unable to move or be active. If the spirometer includes an indicator to show the highest number that you have reached, your health care provider or respiratory therapist will help you set a goal. Keep a log of your progress as told by your health care provider. What are the risks? Breathing too quickly may cause dizziness or cause you to pass out. Take your time so you do not get dizzy or light-headed. If you are in pain, you may need to take pain medicine before doing incentive spirometry. It is harder to take a deep breath if you are having pain. How to use your incentive spirometer  Sit up on the edge of your bed or on a chair. Hold the incentive spirometer so that it is in an upright position. Before you use the spirometer, breathe out normally. Place the mouthpiece in your mouth. Make sure your lips are closed tightly around it. Breathe in slowly and as deeply as you can through your mouth, causing the piston or the ball to rise toward the top of the chamber. Hold your breath for 3-5 seconds, or for as long as possible. If the spirometer includes a coach indicator, use this to guide you in breathing. Slow down your breathing if the indicator goes above the marked areas. Remove the mouthpiece from your mouth and breathe out normally. The piston or ball will return to the bottom of the chamber. Rest for a few seconds, then repeat the steps 10 or more times. Take your time and take a few normal  breaths between deep breaths so that you do not get dizzy or light-headed. Do this every 1-2 hours when you are awake. If the spirometer includes a goal marker to show the highest number you have reached (best effort), use this as a goal to work toward during each repetition. After each set of 10 deep breaths, cough a few times. This will help to make sure that your lungs are clear. If you have an incision on your chest or abdomen from surgery, place a pillow or a rolled-up towel firmly against the incision when you cough. This can help to reduce pain while taking deep breaths and coughing. General tips When you are able to get out of bed: Walk around often. Continue to take deep breaths and cough in order to clear your lungs. Keep using the incentive spirometer until your health care provider says it is okay to stop using it. If you have been in the hospital, you may be told to keep using the spirometer at home. Contact a health care provider if: You are having difficulty using the spirometer. You have trouble using the spirometer as often as instructed. Your pain medicine is not giving enough relief for you to use the spirometer as told. You have a fever. Get help right away if: You develop shortness of breath. You develop a cough with bloody mucus from the lungs. You have fluid or blood coming from an incision  site after you cough. Summary An incentive spirometer is a tool that can help you learn to take long, deep breaths to keep your lungs clear and active. You may be asked to use a spirometer after a surgery, if you have a lung problem or a history of smoking, or if you have been inactive for a long period of time. Use your incentive spirometer as instructed every 1-2 hours while you are awake. If you have an incision on your chest or abdomen, place a pillow or a rolled-up towel firmly against your incision when you cough. This will help to reduce pain. Get help right away if you have  shortness of breath, you cough up bloody mucus, or blood comes from your incision when you cough. This information is not intended to replace advice given to you by your health care provider. Make sure you discuss any questions you have with your health care provider. Document Revised: 01/31/2023 Document Reviewed: 01/31/2023 Elsevier Patient Education  2024 [employer].

## 2023-12-10 NOTE — Patient Instructions (Addendum)
 Your procedure is scheduled on:12-22-23 Monday Report to the Registration Desk on the 1st floor of the [facility].Then proceed to the 2nd floor Surgery Desk To find out your arrival time, please call [phone] between 1PM - 3PM on:12-19-23 Friday If your arrival time is 6:00 am, do not arrive before that time as the [facility] entrance doors do not open until 6:00 am.  REMEMBER: Instructions that are not followed completely may result in serious medical risk, up to and including death; or upon the discretion of your surgeon and anesthesiologist your surgery may need to be rescheduled.  Do not eat food after midnight the night before surgery.  No gum chewing or hard candies.  You may however, drink CLEAR liquids up to 2 hours before you are scheduled to arrive for your surgery. Do not drink anything within 2 hours of your scheduled arrival time.  Clear liquids include: - water  - apple juice without pulp - gatorade (not RED colors) - black coffee or tea (Do NOT add milk or creamers to the coffee or tea) Do NOT drink anything that is not on this list.  One week prior to surgery:Last dose will be on 12-14-23 Stop ANY OVER THE COUNTER supplements until after surgery (B Complex with Vitamin C, Hair Skin and Nails, Vitamin D , Vitamin B12, Multivitamin)  Continue taking all of your other prescription medications up until the day of surgery.  ON THE DAY OF SURGERY ONLY TAKE THESE MEDICATIONS WITH SIPS OF WATER: -gabapentin  (NEURONTIN )  -RABEprazole  (ACIPHEX )   Continue your 81 mg Aspirin up until the day prior to surgery-Do NOT take the morning of surgery  No Alcohol for 24 hours before or after surgery.  No Smoking including e-cigarettes for 24 hours before surgery.  No chewable tobacco products for at least 6 hours before surgery.  No nicotine patches on the day of surgery.  Do not use any recreational drugs for at least a week (preferably 2 weeks) before your surgery.  Please  be advised that the combination of cocaine and anesthesia may have negative outcomes, up to and including death. If you test positive for cocaine, your surgery will be cancelled.  On the morning of surgery brush your teeth with toothpaste and water, you may rinse your mouth with mouthwash if you wish. Do not swallow any toothpaste or mouthwash.  Use CHG Soap as directed on instruction sheet.  Do not wear jewelry, make-up, hairpins, clips or nail polish.  For welded (permanent) jewelry: bracelets, anklets, waist bands, etc.  Please have this removed prior to surgery.  If it is not removed, there is a chance that hospital personnel will need to cut it off on the day of surgery.  Do not wear lotions, powders, or perfumes.   Do not shave body hair from the neck down 48 hours before surgery.  Contact lenses, hearing aids and dentures may not be worn into surgery.  Do not bring valuables to the hospital. [facility] is not responsible for any missing/lost belongings or valuables.   Notify your doctor if there is any change in your medical condition (cold, fever, infection).  Wear comfortable clothing (specific to your surgery type) to the hospital.  After surgery, you can help prevent lung complications by doing breathing exercises.  Take deep breaths and cough every 1-2 hours. Your doctor may order a device called an Incentive Spirometer to help you take deep breaths. When coughing or sneezing, hold a pillow firmly against your incision with  both hands. This is called "splinting." Doing this helps protect your incision. It also decreases belly discomfort.  If you are being admitted to the hospital overnight, leave your suitcase in the car. After surgery it may be brought to your room.  In case of increased patient census, it may be necessary for you, the patient, to continue your postoperative care in the Same Day Surgery department.  If you are being discharged the day of surgery, you  will not be allowed to drive home. You will need a responsible individual to drive you home and stay with you for 24 hours after surgery.   If you are taking public transportation, you will need to have a responsible individual with you.  Please call the Pre-admissions Testing Dept. at [phone] if you have any questions about these instructions.  Surgery Visitation Policy:  Patients having surgery or a procedure may have two visitors.  Children under the age of 73 must have an adult with them who is not the patient.  Inpatient Visitation:    Visiting hours are 7 a.m. to 8 p.m. Up to four visitors are allowed at one time in a patient room. The visitors may rotate out with other people during the day.  One visitor age 27 or older may stay with the patient overnight and must be in the room by 8 p.m.    Pre-operative 5 CHG Bath Instructions   You can play a key role in reducing the risk of infection after surgery. Your skin needs to be as free of germs as possible. You can reduce the number of germs on your skin by washing with CHG (chlorhexidine  gluconate) soap before surgery. CHG is an antiseptic soap that kills germs and continues to kill germs even after washing.   DO NOT use if you have an allergy to chlorhexidine /CHG or antibacterial soaps. If your skin becomes reddened or irritated, stop using the CHG and notify one of our RNs at [phone].   Please shower with the CHG soap starting 4 days before surgery using the following schedule:     Please keep in mind the following:  DO NOT shave, including legs and underarms, starting the day of your first shower.   You may shave your face at any point before/day of surgery.  Place clean sheets on your bed the day you start using CHG soap. Use a clean washcloth (not used since being washed) for each shower. DO NOT sleep with pets once you start using the CHG.   CHG Shower Instructions:  If you choose to wash your hair and  private area, wash first with your normal shampoo/soap.  After you use shampoo/soap, rinse your hair and body thoroughly to remove shampoo/soap residue.  Turn the water OFF and apply about 3 tablespoons (45 ml) of CHG soap to a CLEAN washcloth.  Apply CHG soap ONLY FROM YOUR NECK DOWN TO YOUR TOES (washing for 3-5 minutes)  DO NOT use CHG soap on face, private areas, open wounds, or sores.  Pay special attention to the area where your surgery is being performed.  If you are having back surgery, having someone wash your back for you may be helpful. Wait 2 minutes after CHG soap is applied, then you may rinse off the CHG soap.  Pat dry with a clean towel  Put on clean clothes/pajamas   If you choose to wear lotion, please use ONLY the CHG-compatible lotions on the back of this paper.  Additional instructions for the day of surgery: DO NOT APPLY any lotions, deodorants, cologne, or perfumes.   Put on clean/comfortable clothes.  Brush your teeth.  Ask your nurse before applying any prescription medications to the skin.      CHG Compatible Lotions   Aveeno Moisturizing lotion  Cetaphil Moisturizing Cream  Cetaphil Moisturizing Lotion  Clairol Herbal Essence Moisturizing Lotion, Dry Skin  Clairol Herbal Essence Moisturizing Lotion, Extra Dry Skin  Clairol Herbal Essence Moisturizing Lotion, Normal Skin  Curel Age Defying Therapeutic Moisturizing Lotion with Alpha Hydroxy  Curel Extreme Care Body Lotion  Curel Soothing Hands Moisturizing Hand Lotion  Curel Therapeutic Moisturizing Cream, Fragrance-Free  Curel Therapeutic Moisturizing Lotion, Fragrance-Free  Curel Therapeutic Moisturizing Lotion, Original Formula  Eucerin Daily Replenishing Lotion  Eucerin Dry Skin Therapy Plus Alpha Hydroxy Crme  Eucerin Dry Skin Therapy Plus Alpha Hydroxy Lotion  Eucerin Original Crme  Eucerin Original Lotion  Eucerin Plus Crme Eucerin Plus Lotion  Eucerin TriLipid Replenishing Lotion  Keri  Anti-Bacterial Hand Lotion  Keri Deep Conditioning Original Lotion Dry Skin Formula Softly Scented  Keri Deep Conditioning Original Lotion, Fragrance Free Sensitive Skin Formula  Keri Lotion Fast Absorbing Fragrance Free Sensitive Skin Formula  Keri Lotion Fast Absorbing Softly Scented Dry Skin Formula  Keri Original Lotion  Keri Skin Renewal Lotion Keri Silky Smooth Lotion  Keri Silky Smooth Sensitive Skin Lotion  Nivea Body Creamy Conditioning Oil  Nivea Body Extra Enriched Teacher, adult education Moisturizing Lotion Nivea Crme  Nivea Skin Firming Lotion  NutraDerm 30 Skin Lotion  NutraDerm Skin Lotion  NutraDerm Therapeutic Skin Cream  NutraDerm Therapeutic Skin Lotion  ProShield Protective Hand Cream  Provon moisturizing lotion   Merchandiser, retail to address health-related social needs:  [email or web address]

## 2023-12-18 ENCOUNTER — Encounter: Payer: Self-pay | Admitting: Neurosurgery

## 2023-12-18 ENCOUNTER — Encounter: Payer: Self-pay | Admitting: Internal Medicine

## 2023-12-18 NOTE — Progress Notes (Signed)
 Perioperative / Anesthesia Services  Pre-Admission Testing Clinical Review / Pre-Operative Anesthesia Consult  Date: 12/18/23  PATIENT DEMOGRAPHICS: Name: April Santos DOB: [birth year] MRN:   [id number]  Note: Available PAT nursing documentation and vital signs have been reviewed. Clinical nursing staff has updated patient's PMH/PSHx, current medication list, and drug allergies/intolerances to ensure complete and comprehensive history available to assist care teams in MDM as it pertains to the aforementioned surgical procedure and anticipated anesthetic course. Extensive review of available clinical information personally performed. April Santos PMH and PSHx updated with any diagnoses/procedures that  may have been inadvertently omitted during her intake with the pre-admission testing department's nursing staff.  PLANNED SURGICAL PROCEDURE(S):   Case: [id number] Date/Time: 12/22/23 1358   Procedures:      ANTERIOR LATERAL LUMBAR FUSION WITH PERCUTANEOUS SCREW 1 LEVEL - L4-5 LATERAL LUMBAR INTERBODY FUSION AND POSTERIOR SPINAL FUSION     APPLICATION OF INTRAOPERATIVE CT SCAN   Anesthesia type: General   Diagnosis:      Spondylolisthesis of lumbar region [M43.16]     Chronic bilateral low back pain with right-sided sciatica [G89.29, M54.41]   Pre-op diagnosis:      M43.16 Spondylolisthesis of lumbar region     M54.41, G89.29 Chronic bilateral low back pain with right-sided sciatica   Location: [facility] OR ROOM 03 / [facility]   Surgeons: Clois Fret, MD        CLINICAL DISCUSSION: April Santos is a 68 y.o. female who is submitted for pre-surgical anesthesia review and clearance prior to her undergoing the above procedure. Patient has never been a smoker in the past. Pertinent PMH includes: CAD, aortic atherosclerosis, HLD, pulmonary nodules, GERD (on daily PPI), dysphagia, OA, lumbar DDD with spinal stenosis, anxiety (on BZO).   Patient is followed by  cardiology (End, MD). She was last seen in the cardiology clinic on 11/27/2023; notes reviewed. At the time of her clinic visit, patient doing well overall from a cardiovascular perspective. Patient denied any chest pain, shortness of breath, PND, orthopnea, palpitations, significant peripheral edema, weakness, fatigue, vertiginous symptoms, or presyncope/syncope. Patient with a past medical history significant for cardiovascular diagnoses. Documented physical exam was grossly benign, providing no evidence of acute exacerbation and/or decompensation of the patient's known cardiovascular conditions.  Coronary CTA was performed on 09/24/2023 that demonstrated an Agatston coronary artery calcium  score of 516. This placed patient in the 93rd percentile for age, sex, and race matched controls. Calcium  depositions noted to be isolated mainly in the LAD and RCA distributions.  Study demonstrated normal coronary origin with RIGHT dominance.  Blood pressure well controlled at 124/72 mmHg; takes no antihypertensives.  Patient is on rosuvastatin  for her HLD diagnosis and ASCVD prevention. Patient is not diabetic. She does not have an OSAH diagnosis. Patient is able to complete all of her  ADL/IADLs without cardiovascular limitation.  Per the DASI, patient is able to achieve at least 4 METS of physical activity without experiencing any significant degree of angina/anginal equivalent symptoms. No changes were made to her medication regimen during her visit with cardiology.  Patient scheduled to follow-up with outpatient cardiology in 3 months or sooner if needed.  April Santos Husband is scheduled for an elective ANTERIOR LATERAL LUMBAR FUSION WITH PERCUTANEOUS SCREW 1 LEVEL; APPLICATION OF INTRAOPERATIVE CT SCAN on 12/22/2023 with Dr. Fret Clois, MD. Given patient's past medical history significant for cardiovascular diagnoses, presurgical cardiac clearance was sought by the PAT team.  Per cardiology, April Santos does  not  report angina or dyspnea.  I personally observed her climb 2 flights of stairs without any symptoms.  She is therefore able to complete more than 4 METS of activity.  Her EKG today shows nonspecific T wave changes but no significant abnormalities.  I think it is reasonable for her to proceed with her planned lumbar spine surgery; based on the ACS surgical risk calculator, her risk of perioperative cardiac complication is less than 1%.  In review of the patient's chart, it is noted that she is on daily oral antithrombotic therapy. Given that patient's past medical history is significant for cardiovascular diagnoses, including but not limited to CAD, neurosurgery has cleared patient to continue her daily low dose ASA throughout her perioperative course.  Patient has been updated on these directives from her specialty care providers by the PAT team.  Patient denies previous perioperative complications with anesthesia in the past. In review her EMR, it is noted that patient underwent a general anesthetic course here at [facility] ([facility]) in 02/2023 without documented complications.   MOST RECENT VITAL SIGNS:    12/10/2023    8:42 AM 12/09/2023    1:12 PM 12/03/2023   11:57 AM  Vitals with BMI  Height 5' 1 5' 1 5' 1  Weight 133 lbs 10 oz 130 lbs 130 lbs  BMI 25.26 24.58 24.58  Systolic 120 120 891  Diastolic 80 84 68  Pulse 68 68 71   PROVIDERS/SPECIALISTS: NOTE: Primary physician provider listed below. Patient may have been seen by APP or partner within same practice.   PROVIDER ROLE / SPECIALTY LAST April Clois Fret, MD Neurosurgery (Surgeon) 11/18/2023  Glendia Shad, MD Primary Care Provider 12/03/2023  End, Lonni, MD Cardiology 11/27/2023  Marcelino Nurse, MD Pain Management 12/09/2023   ALLERGIES: Allergies  Allergen Reactions   Cefdinir Hives and Itching    Tolerated 1st generation cephalosporin (CEFAZOLIN ) on 11/16/2020 with no  documented ADRs   Erythromycin Ethylsuccinate [Erythromycin] Other (See Comments)    Unknown    CURRENT HOME MEDICATIONS: No current facility-administered medications for this encounter.    ALPRAZolam  (XANAX ) 0.25 MG tablet   Apremilast  (OTEZLA ) 30 MG TABS   aspirin EC 81 MG tablet   B Complex-C (B-COMPLEX WITH VITAMIN C) tablet   Biotin-Vitamin C (HAIR SKIN NAILS GUMMIES PO)   cholecalciferol (VITAMIN D3) 25 MCG (1000 UNIT) tablet   Crisaborole  (EUCRISA ) 2 % OINT   Cyanocobalamin (VITAMIN B-12) 3000 MCG SUBL   desonide  (DESOWEN ) 0.05 % cream   estradiol  (ESTRACE  VAGINAL) 0.1 MG/GM vaginal cream   gabapentin  (NEURONTIN ) 300 MG capsule   hydrocortisone cream 1 %   ibuprofen (ADVIL) 200 MG tablet   Multiple Vitamins-Minerals (MULTIVITAMIN WITH MINERALS) tablet   RABEprazole  (ACIPHEX ) 20 MG tablet   rosuvastatin  (CRESTOR ) 5 MG tablet   triamcinolone  cream (KENALOG ) 0.1 %   Wheat Dextrin (BENEFIBER) CHEW   amoxicillin (AMOXIL) 500 MG capsule   guaiFENesin -codeine  100-10 MG/5ML syrup   HYDROcodone -acetaminophen  (NORCO/VICODIN) 5-325 MG tablet   mupirocin  ointment (BACTROBAN ) 2 %   HISTORY: Past Medical History:  Diagnosis Date   Anxiety    a.) on BZO PRN (alprazolam )   Aortic atherosclerosis (HCC)    CAD (coronary artery disease)    Colon polyps    COVID-19 2022   DDD (degenerative disc disease), lumbar    Dysphagia    Family history of adverse reaction to anesthesia    a.) PONV in 1st degree relative (mother)   GERD (  gastroesophageal reflux disease)    History of uterine fibroid    Hyperlipidemia    Long-term current use of immunomodulator    a.) on DMARD therapy (apremilast ) for psoriatic arthritis   Long-term use of aspirin therapy    Lumbar stenosis    Lung nodule    Osteoarthritis    Psoriatic arthritis (HCC)    a.) Tx'd with DMARD therapy (apremilast )   Varicose vein of leg    Venous insufficiency    Vitamin D  deficiency    Past Surgical History:   Procedure Laterality Date   ABDOMINAL HYSTERECTOMY     BREAST SURGERY  1999   biopsy   carpal  2005   Tunnel right hand   CARPOMETACARPAL (CMC) FUSION OF THUMB Right 11/16/2020   Procedure: Right CARPOMETACARPAL [facility]) ARTHROPLASTY;  Surgeon: Kathlynn Sharper, MD;  Location: [facility] ORS;  Service: Orthopedics;  Laterality: Right;   COLONOSCOPY WITH ESOPHAGOGASTRODUODENOSCOPY (EGD)     COLONOSCOPY WITH PROPOFOL  N/A 06/10/2016   Procedure: COLONOSCOPY WITH PROPOFOL ;  Surgeon: Lamar ONEIDA Holmes, MD;  Location: [facility] ENDOSCOPY;  Service: Endoscopy;  Laterality: N/A;   ESOPHAGOGASTRODUODENOSCOPY (EGD) WITH PROPOFOL  N/A 06/10/2016   Procedure: ESOPHAGOGASTRODUODENOSCOPY (EGD) WITH PROPOFOL ;  Surgeon: Lamar ONEIDA Holmes, MD;  Location: [facility] ENDOSCOPY;  Service: Endoscopy;  Laterality: N/A;   FOOT SURGERY  02/07/1003   right foot benign tumor   REVERSE SHOULDER ARTHROPLASTY Left 03/04/2023   Procedure: REVERSE SHOULDER ARTHROPLASTY;  Surgeon: Edie Norleen PARAS, MD;  Location: [facility] ORS;  Service: Orthopedics;  Laterality: Left;   SACROILIAC JOINT INJECTION  08/17/13 & 04/13/14   Dr. Avanell   SIGMOIDOSCOPY     UTERINE FIBROID SURGERY  1997   VARICOSE VEIN SURGERY     Family History  Problem Relation Age of Onset   Breast cancer Mother        61/74   Heart attack Father    Arthritis Father    GER disease Father    Coronary artery disease Father        82   Diabetes Brother    Diabetes Maternal Uncle    Colon cancer Neg Hx    Social History   Tobacco Use   Smoking status: Never   Smokeless tobacco: Never  Substance Use Topics   Alcohol use: Not Currently    Comment: rare   LABS:  Hospital Outpatient Visit on 12/10/2023  Component Date Value Ref Range Status   ABO/RH(D) 12/10/2023 A POS   Final   Antibody Screen 12/10/2023 NEG   Final   Sample Expiration 12/10/2023 12/24/2023,2359   Final   Extend sample reason 12/10/2023    Final                   Value:NO TRANSFUSIONS OR PREGNANCY IN THE  PAST 3 MONTHS Performed at [facility], [address] [place], [place] [address]    MRSA, PCR 12/10/2023 NEGATIVE  NEGATIVE Final   Staphylococcus aureus 12/10/2023 NEGATIVE  NEGATIVE Final   Comment: (NOTE) The Xpert SA Assay (FDA approved for NASAL specimens in patients 59 years of age and older), is one component of a comprehensive surveillance program. It is not intended to diagnose infection nor to guide or monitor treatment. Performed at [facility], [address] [place], [place] [address]    Color, Urine 12/10/2023 YELLOW (A)  YELLOW Final   APPearance 12/10/2023 CLEAR (A)  CLEAR Final   Specific Gravity, Urine 12/10/2023 1.012  1.005 - 1.030 Final   pH  12/10/2023 7.0  5.0 - 8.0 Final   Glucose, UA 12/10/2023 NEGATIVE  NEGATIVE mg/dL Final   Hgb urine dipstick 12/10/2023 NEGATIVE  NEGATIVE Final   Bilirubin Urine 12/10/2023 NEGATIVE  NEGATIVE Final   Ketones, ur 12/10/2023 NEGATIVE  NEGATIVE mg/dL Final   Protein, ur 90/96/7974 NEGATIVE  NEGATIVE mg/dL Final   Nitrite 90/96/7974 NEGATIVE  NEGATIVE Final   Leukocytes,Ua 12/10/2023 NEGATIVE  NEGATIVE Final   RBC / HPF 12/10/2023 0  0 - 5 RBC/hpf Final   WBC, UA 12/10/2023 0-5  0 - 5 WBC/hpf Final   Bacteria, UA 12/10/2023 RARE (A)  NONE SEEN Final   Squamous Epithelial / HPF 12/10/2023 0-5  0 - 5 /HPF Final   Mucus 12/10/2023 PRESENT   Final   Performed at [facility], [address], [place], [place] [address]  Office Visit on 12/03/2023  Component Date Value Ref Range Status   Lipoprotein (a) 12/03/2023 194 (H)  <75 nmol/L Final   Comment: . Risk Category   Optimal        < 75 nmol/L   Moderate   75 - 125 nmol/L   High          > 125 nmol/L . Cardiovascular event risk category cut points (optimal, moderate, high) are based on Tsimika S. JACC 2017;69:692-711. .    WBC 12/03/2023 3.8 (L)  4.0 - 10.5 K/uL Final   RBC 12/03/2023 4.47  3.87 - 5.11 Mil/uL Final   Hemoglobin  12/03/2023 12.7  12.0 - 15.0 g/dL Final   HCT 91/72/7974 38.5  36.0 - 46.0 % Final   MCV 12/03/2023 86.1  78.0 - 100.0 fl Final   MCHC 12/03/2023 33.0  30.0 - 36.0 g/dL Final   RDW 91/72/7974 13.4  11.5 - 15.5 % Final   Platelets 12/03/2023 278.0  150.0 - 400.0 K/uL Final   Neutrophils Relative % 12/03/2023 49.9  43.0 - 77.0 % Final   Lymphocytes Relative 12/03/2023 38.9  12.0 - 46.0 % Final   Monocytes Relative 12/03/2023 9.0  3.0 - 12.0 % Final   Eosinophils Relative 12/03/2023 1.8  0.0 - 5.0 % Final   Basophils Relative 12/03/2023 0.4  0.0 - 3.0 % Final   Neutro Abs 12/03/2023 1.9  1.4 - 7.7 K/uL Final   Lymphs Abs 12/03/2023 1.5  0.7 - 4.0 K/uL Final   Monocytes Absolute 12/03/2023 0.3  0.1 - 1.0 K/uL Final   Eosinophils Absolute 12/03/2023 0.1  0.0 - 0.7 K/uL Final   Basophils Absolute 12/03/2023 0.0  0.0 - 0.1 K/uL Final   Sodium 12/03/2023 140  135 - 145 mEq/L Final   Potassium 12/03/2023 4.6  3.5 - 5.1 mEq/L Final   Chloride 12/03/2023 101  96 - 112 mEq/L Final   CO2 12/03/2023 30  19 - 32 mEq/L Final   Glucose, Bld 12/03/2023 85  70 - 99 mg/dL Final   BUN 91/72/7974 13  6 - 23 mg/dL Final   Creatinine, Ser 12/03/2023 0.68  0.40 - 1.20 mg/dL Final   GFR 91/72/7974 88.82  >60.00 mL/min Final   Calculated using the CKD-EPI Creatinine Equation (2021)   Calcium  12/03/2023 9.4  8.4 - 10.5 mg/dL Final    ECG: Date: 91/78/7974  Time ECG obtained: 0844 AM Rate: 59 bpm Rhythm: sinus bradycardia Axis (leads I and aVF): normal Intervals: PR 158 ms. QRS 68 ms. QTc 384 ms. ST segment and T wave changes: Nonspecific T wave abnormality in lead III.   Evidence of a  possible, age undetermined, prior infarct:  No Comparison: Similar to previous tracing obtained on 02/25/2023   IMAGING / PROCEDURES: CT CARDIAC SCORING performed on 09/24/2023 Coronary calcium  score of 516. This was 25 percentile for age, race, and sex matched controls. Aortic atherosclerosis.  MR LUMBAR SPINE WO  CONTRAST performed on 09/17/2023 Lumbar spondylosis and degenerative disc disease, causing severe impingement at L4-5; moderate impingement at L3-4; and mild impingement at L2-3 and L5-S1. Moderate levoconvex lumbar scoliosis with rotary component. Substantial degenerative facet edema bilaterally at L4-5 which can be a direct source for lumbar back pain. Cholelithiasis.  CT ABDOMEN PELVIS W CONTRAST performed on 09/30/2023 Degenerative disc and facet disease throughout the lumbar spine. 7 mm anterolisthesis of L4 on L5 related to facet disease. Cholelithiasis.  No CT evidence of acute cholecystitis. No acute findings in the abdomen or pelvis.  IMPRESSION AND PLAN: KENNI NEWTON has been referred for pre-anesthesia review and clearance prior to her undergoing the planned anesthetic and procedural courses. Available labs, pertinent testing, and imaging results were personally reviewed by me in preparation for upcoming operative/procedural course. The [facility] medical record has been updated following extensive record review and patient interview with PAT staff.   This patient has been appropriately cleared by cardiology with an overall ACCEPTABLE risk of patient experiencing significant perioperative cardiovascular complications. Based on clinical review performed today (12/18/23), barring any significant acute changes in the patient's overall condition, it is anticipated that she will be able to proceed with the planned surgical intervention. Any acute changes in clinical condition may necessitate her procedure being postponed and/or cancelled. Patient will meet with anesthesia team (MD and/or CRNA) on the day of her procedure for preoperative evaluation/assessment. Questions regarding anesthetic course will be fielded at that time.   Pre-surgical instructions were reviewed with the patient during his PAT appointment, and questions were fielded to satisfaction by PAT clinical staff. She has been  instructed on which medications that she will need to hold prior to surgery, as well as the ones that have been deemed safe/appropriate to take on the day of her procedure. As part of the general education provided by PAT, patient made aware both verbally and in writing, that she would need to abstain from the use of any illegal substances during her perioperative course. She was advised that failure to follow the provided instructions could necessitate case cancellation or result in serious perioperative complications up to and including death. Patient encouraged to contact PAT and/or her surgeon's office to discuss any questions or concerns that may arise prior to surgery; verbalized understanding.   Dorise Pereyra, MSN, APRN, FNP-C, CEN [facility]  Perioperative Services Nurse Practitioner Phone: [phone] Fax: [phone] 12/18/23 11:48 AM  NOTE: This note has been prepared using Dragon dictation software. Despite my best ability to proofread, there is always the potential that unintentional transcriptional errors may still occur from this process.

## 2023-12-21 MED ORDER — CHLORHEXIDINE GLUCONATE 0.12 % MT SOLN
15.0000 mL | Freq: Once | OROMUCOSAL | Status: AC
  Administered 2023-12-22: 15 mL via OROMUCOSAL

## 2023-12-21 MED ORDER — CEFAZOLIN IN SODIUM CHLORIDE 2-0.9 GM/100ML-% IV SOLN
2.0000 g | Freq: Once | INTRAVENOUS | Status: DC
  Filled 2023-12-21: qty 100

## 2023-12-21 MED ORDER — ORAL CARE MOUTH RINSE: 15.0000 mL | Freq: Once | OROMUCOSAL | Status: AC

## 2023-12-21 MED ORDER — VANCOMYCIN HCL IN DEXTROSE 1-5 GM/200ML-% IV SOLN
1000.0000 mg | Freq: Once | INTRAVENOUS | Status: AC
  Administered 2023-12-22: 1000 mg via INTRAVENOUS
  Filled 2023-12-21: qty 200

## 2023-12-21 MED ORDER — CEFAZOLIN SODIUM-DEXTROSE 2-4 GM/100ML-% IV SOLN
2.0000 g | INTRAVENOUS | Status: AC
  Administered 2023-12-22: 2 g via INTRAVENOUS

## 2023-12-21 MED ORDER — LACTATED RINGERS IV SOLN: INTRAVENOUS | Status: DC

## 2023-12-22 ENCOUNTER — Inpatient Hospital Stay

## 2023-12-22 ENCOUNTER — Inpatient Hospital Stay: Payer: Self-pay | Admitting: Urgent Care

## 2023-12-22 ENCOUNTER — Inpatient Hospital Stay
Admission: RE | Admit: 2023-12-22 | Discharge: 2023-12-24 | DRG: 402 | Disposition: A | Discharge status: Home / Self Care | Attending: Neurosurgery | Admitting: Neurosurgery

## 2023-12-22 ENCOUNTER — Other Ambulatory Visit: Payer: Self-pay

## 2023-12-22 ENCOUNTER — Encounter
Admission: RE | Disposition: A | Discharge status: Home / Self Care | Payer: Self-pay | Source: Home / Self Care | Attending: Neurosurgery

## 2023-12-22 ENCOUNTER — Encounter: Payer: Self-pay | Admitting: Neurosurgery

## 2023-12-22 DIAGNOSIS — Z90710 Acquired absence of both cervix and uterus: Secondary | ICD-10-CM | POA: Diagnosis not present

## 2023-12-22 DIAGNOSIS — Z833 Family history of diabetes mellitus: Secondary | ICD-10-CM | POA: Diagnosis not present

## 2023-12-22 DIAGNOSIS — K219 Gastro-esophageal reflux disease without esophagitis: Secondary | ICD-10-CM | POA: Diagnosis not present

## 2023-12-22 DIAGNOSIS — Z981 Arthrodesis status: Principal | ICD-10-CM

## 2023-12-22 DIAGNOSIS — E785 Hyperlipidemia, unspecified: Secondary | ICD-10-CM | POA: Diagnosis not present

## 2023-12-22 DIAGNOSIS — Z7982 Long term (current) use of aspirin: Secondary | ICD-10-CM

## 2023-12-22 DIAGNOSIS — G8929 Other chronic pain: Secondary | ICD-10-CM | POA: Diagnosis present

## 2023-12-22 DIAGNOSIS — Z860100 Personal history of colon polyps, unspecified: Secondary | ICD-10-CM

## 2023-12-22 DIAGNOSIS — M5441 Lumbago with sciatica, right side: Secondary | ICD-10-CM | POA: Diagnosis present

## 2023-12-22 DIAGNOSIS — M4316 Spondylolisthesis, lumbar region: Principal | ICD-10-CM | POA: Diagnosis present

## 2023-12-22 DIAGNOSIS — Z96612 Presence of left artificial shoulder joint: Secondary | ICD-10-CM | POA: Diagnosis present

## 2023-12-22 DIAGNOSIS — R1310 Dysphagia, unspecified: Secondary | ICD-10-CM | POA: Diagnosis not present

## 2023-12-22 DIAGNOSIS — Z888 Allergy status to other drugs, medicaments and biological substances status: Secondary | ICD-10-CM

## 2023-12-22 DIAGNOSIS — Z8249 Family history of ischemic heart disease and other diseases of the circulatory system: Secondary | ICD-10-CM

## 2023-12-22 DIAGNOSIS — M51369 Other intervertebral disc degeneration, lumbar region without mention of lumbar back pain or lower extremity pain: Secondary | ICD-10-CM | POA: Diagnosis not present

## 2023-12-22 DIAGNOSIS — I872 Venous insufficiency (chronic) (peripheral): Secondary | ICD-10-CM | POA: Diagnosis present

## 2023-12-22 DIAGNOSIS — I700 Atherosclerosis of aorta: Secondary | ICD-10-CM | POA: Diagnosis present

## 2023-12-22 DIAGNOSIS — Z881 Allergy status to other antibiotic agents status: Secondary | ICD-10-CM | POA: Diagnosis not present

## 2023-12-22 DIAGNOSIS — Z8261 Family history of arthritis: Secondary | ICD-10-CM | POA: Diagnosis not present

## 2023-12-22 DIAGNOSIS — Z01818 Encounter for other preprocedural examination: Secondary | ICD-10-CM

## 2023-12-22 DIAGNOSIS — L4050 Arthropathic psoriasis, unspecified: Secondary | ICD-10-CM | POA: Diagnosis not present

## 2023-12-22 DIAGNOSIS — F419 Anxiety disorder, unspecified: Secondary | ICD-10-CM | POA: Diagnosis present

## 2023-12-22 DIAGNOSIS — M48061 Spinal stenosis, lumbar region without neurogenic claudication: Secondary | ICD-10-CM | POA: Diagnosis present

## 2023-12-22 DIAGNOSIS — Z803 Family history of malignant neoplasm of breast: Secondary | ICD-10-CM

## 2023-12-22 DIAGNOSIS — I2510 Atherosclerotic heart disease of native coronary artery without angina pectoris: Secondary | ICD-10-CM | POA: Diagnosis present

## 2023-12-22 DIAGNOSIS — E559 Vitamin D deficiency, unspecified: Secondary | ICD-10-CM | POA: Diagnosis present

## 2023-12-22 DIAGNOSIS — Z8616 Personal history of COVID-19: Secondary | ICD-10-CM | POA: Diagnosis not present

## 2023-12-22 HISTORY — DX: Calculus of gallbladder without cholecystitis without obstruction: K80.20

## 2023-12-22 HISTORY — PX: APPLICATION OF INTRAOPERATIVE CT SCAN: SHX6668

## 2023-12-22 HISTORY — PX: ANTERIOR LATERAL LUMBAR FUSION WITH PERCUTANEOUS SCREW 1 LEVEL: SHX5553

## 2023-12-22 LAB — ABO/RH: ABO/RH(D): A POS

## 2023-12-22 SURGERY — ANTERIOR LATERAL LUMBAR FUSION WITH PERCUTANEOUS SCREW 1 LEVEL
Anesthesia: General | Site: Spine Lumbar

## 2023-12-22 MED ORDER — PHENYLEPHRINE 80 MCG/ML (10ML) SYRINGE FOR IV PUSH (FOR BLOOD PRESSURE SUPPORT)
PREFILLED_SYRINGE | INTRAVENOUS | Status: AC
  Filled 2023-12-22: qty 10

## 2023-12-22 MED ORDER — IRRISEPT - 450ML BOTTLE WITH 0.05% CHG IN STERILE WATER, USP 99.95% OPTIME
TOPICAL | Status: DC | PRN
  Administered 2023-12-22: 450 mL

## 2023-12-22 MED ORDER — GUAIFENESIN-CODEINE 100-10 MG/5ML PO SOLN: 5.0000 mL | Freq: Every evening | ORAL | Status: DC | PRN

## 2023-12-22 MED ORDER — HYDROCORTISONE 1 % EX CREA: 1.0000 | TOPICAL_CREAM | Freq: Two times a day (BID) | CUTANEOUS | Status: DC | PRN

## 2023-12-22 MED ORDER — FENTANYL CITRATE (PF) 100 MCG/2ML IJ SOLN
INTRAMUSCULAR | Status: DC | PRN
  Administered 2023-12-22 (×2): 50 ug via INTRAVENOUS

## 2023-12-22 MED ORDER — CEFAZOLIN SODIUM-DEXTROSE 2-4 GM/100ML-% IV SOLN
INTRAVENOUS | Status: AC
  Filled 2023-12-22: qty 100

## 2023-12-22 MED ORDER — CRISABOROLE 2 % EX OINT: 1.0000 | TOPICAL_OINTMENT | Freq: Two times a day (BID) | CUTANEOUS | Status: DC | PRN

## 2023-12-22 MED ORDER — OXYCODONE HCL 5 MG PO TABS
5.0000 mg | ORAL_TABLET | Freq: Once | ORAL | Status: AC | PRN
  Administered 2023-12-22: 5 mg via ORAL

## 2023-12-22 MED ORDER — METHOCARBAMOL 1000 MG/10ML IJ SOLN: 500.0000 mg | Freq: Four times a day (QID) | INTRAMUSCULAR | Status: DC | PRN

## 2023-12-22 MED ORDER — PHENYLEPHRINE 80 MCG/ML (10ML) SYRINGE FOR IV PUSH (FOR BLOOD PRESSURE SUPPORT)
PREFILLED_SYRINGE | INTRAVENOUS | Status: DC | PRN
  Administered 2023-12-22 (×2): 80 ug via INTRAVENOUS

## 2023-12-22 MED ORDER — OXYCODONE HCL 5 MG/5ML PO SOLN: 5.0000 mg | Freq: Once | ORAL | Status: AC | PRN

## 2023-12-22 MED ORDER — DOCUSATE SODIUM 100 MG PO CAPS
100.0000 mg | ORAL_CAPSULE | Freq: Two times a day (BID) | ORAL | Status: DC
  Administered 2023-12-22 – 2023-12-24 (×4): 100 mg via ORAL
  Filled 2023-12-22 (×4): qty 1

## 2023-12-22 MED ORDER — MIDAZOLAM HCL 2 MG/2ML IJ SOLN
INTRAMUSCULAR | Status: AC
  Filled 2023-12-22: qty 2

## 2023-12-22 MED ORDER — CHLORHEXIDINE GLUCONATE 0.12 % MT SOLN
OROMUCOSAL | Status: AC
Start: 2023-12-22 — End: 2023-12-22
  Filled 2023-12-22: qty 15

## 2023-12-22 MED ORDER — ENOXAPARIN SODIUM 40 MG/0.4ML IJ SOSY
40.0000 mg | PREFILLED_SYRINGE | INTRAMUSCULAR | Status: DC
  Administered 2023-12-23 – 2023-12-24 (×2): 40 mg via SUBCUTANEOUS
  Filled 2023-12-22 (×2): qty 0.4

## 2023-12-22 MED ORDER — KETOROLAC TROMETHAMINE 15 MG/ML IJ SOLN
INTRAMUSCULAR | Status: AC
  Filled 2023-12-22: qty 1

## 2023-12-22 MED ORDER — DEXAMETHASONE SODIUM PHOSPHATE 10 MG/ML IJ SOLN
INTRAMUSCULAR | Status: DC | PRN
  Administered 2023-12-22: 10 mg via INTRAVENOUS

## 2023-12-22 MED ORDER — LIDOCAINE HCL (CARDIAC) PF 100 MG/5ML IV SOSY
PREFILLED_SYRINGE | INTRAVENOUS | Status: DC | PRN
  Administered 2023-12-22: 60 mg via INTRAVENOUS

## 2023-12-22 MED ORDER — EPHEDRINE SULFATE-NACL 50-0.9 MG/10ML-% IV SOSY
PREFILLED_SYRINGE | INTRAVENOUS | Status: DC | PRN
  Administered 2023-12-22 (×2): 5 mg via INTRAVENOUS

## 2023-12-22 MED ORDER — ACETAMINOPHEN 325 MG PO TABS: 650.0000 mg | ORAL_TABLET | ORAL | Status: DC | PRN

## 2023-12-22 MED ORDER — GLYCOPYRROLATE 0.2 MG/ML IJ SOLN
INTRAMUSCULAR | Status: AC
  Filled 2023-12-22: qty 1

## 2023-12-22 MED ORDER — APREMILAST 30 MG PO TABS: 30.0000 mg | ORAL_TABLET | Freq: Every day | ORAL | Status: DC

## 2023-12-22 MED ORDER — SENNA 8.6 MG PO TABS
1.0000 | ORAL_TABLET | Freq: Two times a day (BID) | ORAL | Status: DC
  Administered 2023-12-22 – 2023-12-24 (×4): 8.6 mg via ORAL
  Filled 2023-12-22 (×4): qty 1

## 2023-12-22 MED ORDER — METHOCARBAMOL 500 MG PO TABS
500.0000 mg | ORAL_TABLET | Freq: Four times a day (QID) | ORAL | Status: DC | PRN
  Administered 2023-12-22: 500 mg via ORAL

## 2023-12-22 MED ORDER — KETAMINE HCL 50 MG/5ML IJ SOSY
PREFILLED_SYRINGE | INTRAMUSCULAR | Status: DC | PRN
Start: 2023-12-22 — End: 2023-12-22
  Administered 2023-12-22: 30 mg via INTRAVENOUS
  Administered 2023-12-22: 10 mg via INTRAVENOUS

## 2023-12-22 MED ORDER — MIDAZOLAM HCL 2 MG/2ML IJ SOLN
INTRAMUSCULAR | Status: DC | PRN
  Administered 2023-12-22: 2 mg via INTRAVENOUS

## 2023-12-22 MED ORDER — ALPRAZOLAM 0.25 MG PO TABS
0.1250 mg | ORAL_TABLET | Freq: Every day | ORAL | Status: DC
  Administered 2023-12-23: 0.125 mg via ORAL
  Filled 2023-12-22: qty 1

## 2023-12-22 MED ORDER — PROPOFOL 10 MG/ML IV BOLUS
INTRAVENOUS | Status: AC
  Filled 2023-12-22: qty 20

## 2023-12-22 MED ORDER — ONDANSETRON HCL 4 MG/2ML IJ SOLN: 4.0000 mg | Freq: Four times a day (QID) | INTRAMUSCULAR | Status: DC | PRN

## 2023-12-22 MED ORDER — DESONIDE 0.05 % EX CREA: 1.0000 | TOPICAL_CREAM | Freq: Two times a day (BID) | CUTANEOUS | Status: DC | PRN

## 2023-12-22 MED ORDER — ONDANSETRON HCL 4 MG/2ML IJ SOLN
INTRAMUSCULAR | Status: AC
  Filled 2023-12-22: qty 2

## 2023-12-22 MED ORDER — PROPOFOL 10 MG/ML IV BOLUS
INTRAVENOUS | Status: DC | PRN
  Administered 2023-12-22: 80 mg via INTRAVENOUS

## 2023-12-22 MED ORDER — FENTANYL CITRATE (PF) 100 MCG/2ML IJ SOLN
INTRAMUSCULAR | Status: AC
  Filled 2023-12-22: qty 2

## 2023-12-22 MED ORDER — KETOROLAC TROMETHAMINE 15 MG/ML IJ SOLN
7.5000 mg | Freq: Four times a day (QID) | INTRAMUSCULAR | Status: AC
  Administered 2023-12-22 – 2023-12-23 (×3): 7.5 mg via INTRAVENOUS
  Filled 2023-12-22 (×2): qty 1

## 2023-12-22 MED ORDER — ROSUVASTATIN CALCIUM 5 MG PO TABS
5.0000 mg | ORAL_TABLET | Freq: Every day | ORAL | Status: DC
  Administered 2023-12-22 – 2023-12-24 (×3): 5 mg via ORAL
  Filled 2023-12-22 (×3): qty 1

## 2023-12-22 MED ORDER — BUPIVACAINE-EPINEPHRINE 0.5% -1:200000 IJ SOLN
INTRAMUSCULAR | Status: DC | PRN
  Administered 2023-12-22: 9 mL
  Administered 2023-12-22: 10 mL

## 2023-12-22 MED ORDER — LIDOCAINE HCL (PF) 2 % IJ SOLN
INTRAMUSCULAR | Status: AC
  Filled 2023-12-22: qty 5

## 2023-12-22 MED ORDER — MAGNESIUM CITRATE PO SOLN: 1.0000 | Freq: Once | ORAL | Status: DC | PRN

## 2023-12-22 MED ORDER — ACETAMINOPHEN 500 MG PO TABS
1000.0000 mg | ORAL_TABLET | Freq: Four times a day (QID) | ORAL | Status: DC
  Administered 2023-12-23 – 2023-12-24 (×4): 1000 mg via ORAL
  Filled 2023-12-22 (×6): qty 2

## 2023-12-22 MED ORDER — ACETAMINOPHEN 10 MG/ML IV SOLN
INTRAVENOUS | Status: DC | PRN
  Administered 2023-12-22: 1000 mg via INTRAVENOUS

## 2023-12-22 MED ORDER — HYDROMORPHONE HCL 1 MG/ML IJ SOLN: 0.5000 mg | INTRAMUSCULAR | Status: DC | PRN

## 2023-12-22 MED ORDER — FENTANYL CITRATE (PF) 100 MCG/2ML IJ SOLN
25.0000 ug | INTRAMUSCULAR | Status: DC | PRN
  Administered 2023-12-22: 25 ug via INTRAVENOUS
  Administered 2023-12-22: 50 ug via INTRAVENOUS

## 2023-12-22 MED ORDER — SODIUM CHLORIDE 0.9 % IV SOLN
250.0000 mL | INTRAVENOUS | Status: AC
  Administered 2023-12-22: 250 mL via INTRAVENOUS

## 2023-12-22 MED ORDER — SODIUM CHLORIDE 0.9% FLUSH
3.0000 mL | Freq: Two times a day (BID) | INTRAVENOUS | Status: DC
  Administered 2023-12-22 – 2023-12-23 (×3): 3 mL via INTRAVENOUS

## 2023-12-22 MED ORDER — SODIUM CHLORIDE 0.9% FLUSH: 3.0000 mL | INTRAVENOUS | Status: DC | PRN

## 2023-12-22 MED ORDER — EPHEDRINE 5 MG/ML INJ
INTRAVENOUS | Status: AC
  Filled 2023-12-22: qty 5

## 2023-12-22 MED ORDER — METHOCARBAMOL 500 MG PO TABS
ORAL_TABLET | ORAL | Status: AC
  Filled 2023-12-22: qty 1

## 2023-12-22 MED ORDER — POLYETHYLENE GLYCOL 3350 17 G PO PACK
17.0000 g | PACK | Freq: Every day | ORAL | Status: DC | PRN
  Administered 2023-12-23 – 2023-12-24 (×2): 17 g via ORAL
  Filled 2023-12-22 (×2): qty 1

## 2023-12-22 MED ORDER — ONDANSETRON HCL 4 MG/2ML IJ SOLN
INTRAMUSCULAR | Status: DC | PRN
  Administered 2023-12-22: 4 mg via INTRAVENOUS

## 2023-12-22 MED ORDER — MENTHOL 3 MG MT LOZG: 1.0000 | LOZENGE | OROMUCOSAL | Status: DC | PRN

## 2023-12-22 MED ORDER — PHENOL 1.4 % MT LIQD: 1.0000 | OROMUCOSAL | Status: DC | PRN

## 2023-12-22 MED ORDER — SURGIFLO WITH THROMBIN (HEMOSTATIC MATRIX KIT) OPTIME
TOPICAL | Status: DC | PRN
  Administered 2023-12-22: 1 via TOPICAL

## 2023-12-22 MED ORDER — PHENYLEPHRINE HCL-NACL 20-0.9 MG/250ML-% IV SOLN
INTRAVENOUS | Status: DC | PRN
  Administered 2023-12-22: 40 ug/min via INTRAVENOUS

## 2023-12-22 MED ORDER — SUCCINYLCHOLINE CHLORIDE 200 MG/10ML IV SOSY
PREFILLED_SYRINGE | INTRAVENOUS | Status: DC | PRN
  Administered 2023-12-22: 120 mg via INTRAVENOUS

## 2023-12-22 MED ORDER — GABAPENTIN 300 MG PO CAPS
300.0000 mg | ORAL_CAPSULE | Freq: Four times a day (QID) | ORAL | Status: DC
  Administered 2023-12-22 – 2023-12-24 (×7): 300 mg via ORAL
  Filled 2023-12-22 (×7): qty 1

## 2023-12-22 MED ORDER — ACETAMINOPHEN 10 MG/ML IV SOLN
INTRAVENOUS | Status: AC
  Filled 2023-12-22: qty 100

## 2023-12-22 MED ORDER — 0.9 % SODIUM CHLORIDE (POUR BTL) OPTIME
TOPICAL | Status: DC | PRN
  Administered 2023-12-22: 500 mL

## 2023-12-22 MED ORDER — OXYCODONE HCL 5 MG PO TABS
ORAL_TABLET | ORAL | Status: AC
  Filled 2023-12-22: qty 1

## 2023-12-22 MED ORDER — GLYCOPYRROLATE 0.2 MG/ML IJ SOLN
INTRAMUSCULAR | Status: DC | PRN
  Administered 2023-12-22: .2 mg via INTRAVENOUS

## 2023-12-22 MED ORDER — PHENYLEPHRINE HCL-NACL 20-0.9 MG/250ML-% IV SOLN
INTRAVENOUS | Status: AC
  Filled 2023-12-22: qty 250

## 2023-12-22 MED ORDER — OXYCODONE HCL 5 MG PO TABS
10.0000 mg | ORAL_TABLET | ORAL | Status: DC | PRN
  Administered 2023-12-22 – 2023-12-24 (×4): 10 mg via ORAL
  Filled 2023-12-22 (×4): qty 2

## 2023-12-22 MED ORDER — DEXAMETHASONE SODIUM PHOSPHATE 10 MG/ML IJ SOLN
INTRAMUSCULAR | Status: AC
  Filled 2023-12-22: qty 1

## 2023-12-22 MED ORDER — OXYCODONE HCL 5 MG PO TABS: 5.0000 mg | ORAL_TABLET | ORAL | Status: DC | PRN

## 2023-12-22 MED ORDER — SUCCINYLCHOLINE CHLORIDE 200 MG/10ML IV SOSY
PREFILLED_SYRINGE | INTRAVENOUS | Status: AC
  Filled 2023-12-22: qty 10

## 2023-12-22 MED ORDER — SODIUM CHLORIDE (PF) 0.9 % IJ SOLN
INTRAMUSCULAR | Status: DC | PRN
  Administered 2023-12-22: 60 mL via INTRAMUSCULAR

## 2023-12-22 MED ORDER — SORBITOL 70 % SOLN: 30.0000 mL | Freq: Every day | Status: DC | PRN

## 2023-12-22 MED ORDER — ONDANSETRON HCL 4 MG PO TABS: 4.0000 mg | ORAL_TABLET | Freq: Four times a day (QID) | ORAL | Status: DC | PRN

## 2023-12-22 MED ORDER — ACETAMINOPHEN 650 MG RE SUPP: 650.0000 mg | RECTAL | Status: DC | PRN

## 2023-12-22 MED ORDER — PANTOPRAZOLE SODIUM 40 MG PO TBEC
40.0000 mg | DELAYED_RELEASE_TABLET | Freq: Every day | ORAL | Status: DC
  Administered 2023-12-23 – 2023-12-24 (×2): 40 mg via ORAL
  Filled 2023-12-22 (×2): qty 1

## 2023-12-22 MED ORDER — KETAMINE HCL 50 MG/5ML IJ SOSY
PREFILLED_SYRINGE | INTRAMUSCULAR | Status: AC
  Filled 2023-12-22: qty 5

## 2023-12-22 SURGICAL SUPPLY — 67 items
ALLOGRAFT BONESTRIP KORE 2.5X5 (Bone Implant) IMPLANT
BASIN KIT SINGLE STR (MISCELLANEOUS) ×2 IMPLANT
CHLORAPREP W/TINT 26 (MISCELLANEOUS) ×4 IMPLANT
CORD BIP STRL DISP 12FT (MISCELLANEOUS) ×2 IMPLANT
CORD LIGHT LATERIAL X LIFT (MISCELLANEOUS) IMPLANT
DERMABOND ADVANCED .7 DNX12 (GAUZE/BANDAGES/DRESSINGS) ×6 IMPLANT
DRAPE C ARM PK CFD 31 SPINE (DRAPES) ×2 IMPLANT
DRAPE C-ARMOR (DRAPES) IMPLANT
DRAPE HD 5FT BACK TABLE (DRAPES) ×2 IMPLANT
DRAPE INCISE IOBAN 66X45 STRL (DRAPES) ×2 IMPLANT
DRAPE LAPAROTOMY 100X77 ABD (DRAPES) ×4 IMPLANT
DRAPE MICROSCOPE SPINE 48X150 (DRAPES) IMPLANT
DRAPE POUCH INSTRU U-SHP 10X18 (DRAPES) ×2 IMPLANT
DRAPE SCAN PATIENT (DRAPES) ×2 IMPLANT
DRAPE TABLE BACK 80X90 (DRAPES) ×2 IMPLANT
DRSG OPSITE POSTOP 4X6 (GAUZE/BANDAGES/DRESSINGS) IMPLANT
DRSG TEGADERM 2-3/8X2-3/4 SM (GAUZE/BANDAGES/DRESSINGS) IMPLANT
DRSG TEGADERM 4X4.75 (GAUZE/BANDAGES/DRESSINGS) IMPLANT
DRSG TEGADERM 6X8 (GAUZE/BANDAGES/DRESSINGS) IMPLANT
ELECTRODE REM PT RTRN 9FT ADLT (ELECTROSURGICAL) ×4 IMPLANT
EX-PIN ORTHOLOCK NAV 4X150 (PIN) ×2 IMPLANT
FEE CVG SUPP BRAINLAB NG SPNE (MISCELLANEOUS) ×2 IMPLANT
FEE INTRAOP CADWELL SUPPLY NCS (MISCELLANEOUS) IMPLANT
FEE INTRAOP MONITOR IMPULS NCS (MISCELLANEOUS) IMPLANT
FORCEPS BPLR BAYO 10IN 1.0TIP (ORTHOPEDIC DISPOSABLE SUPPLIES) IMPLANT
GAUZE 4X4 16PLY ~~LOC~~+RFID DBL (SPONGE) IMPLANT
GAUZE SPONGE 2X2 STRL 8-PLY (GAUZE/BANDAGES/DRESSINGS) IMPLANT
GLOVE BIOGEL PI IND STRL 6.5 (GLOVE) ×6 IMPLANT
GLOVE SURG SYN 6.5 PF PI (GLOVE) ×10 IMPLANT
GLOVE SURG SYN 8.5 PF PI (GLOVE) ×12 IMPLANT
GOWN SRG LRG LVL 4 IMPRV REINF (GOWNS) ×4 IMPLANT
GOWN SRG XL LVL 3 NONREINFORCE (GOWNS) ×4 IMPLANT
GUIDEWIRE NITINOL BEVEL TIP (WIRE) IMPLANT
HOLDER FOLEY CATH W/STRAP (MISCELLANEOUS) ×2 IMPLANT
KIT DILATOR XLIF 5 (KITS) IMPLANT
KIT DISP MARS 3V (KITS) IMPLANT
KIT SPINAL PRONEVIEW (KITS) ×2 IMPLANT
KIT TURNOVER KIT A (KITS) ×2 IMPLANT
LAVAGE JET IRRISEPT WOUND (IRRIGATION / IRRIGATOR) ×2 IMPLANT
MANIFOLD NEPTUNE II (INSTRUMENTS) ×4 IMPLANT
MARKER SKIN DUAL TIP RULER LAB (MISCELLANEOUS) ×2 IMPLANT
MARKER SPHERE PSV REFLC 13MM (MARKER) ×14 IMPLANT
MODULE NVM5 NEXT GEN EMG (NEUROSURGERY SUPPLIES) IMPLANT
NDL SAFETY ECLIPSE 18X1.5 (NEEDLE) ×2 IMPLANT
NS IRRIG 500ML POUR BTL (IV SOLUTION) ×2 IMPLANT
PACK LAMINECTOMY ARMC (PACKS) ×2 IMPLANT
PAD ARMBOARD POSITIONER FOAM (MISCELLANEOUS) ×2 IMPLANT
PENCIL SMOKE EVACUATOR (MISCELLANEOUS) ×2 IMPLANT
ROD RELINE MAS TI LORD 5.5X40 (Rod) IMPLANT
SCREW LOCK RELINE 5.5 TULIP (Screw) IMPLANT
SCREW RELINE MAS RED 7.5X50MM (Screw) IMPLANT
SCREW RELINE RED 6.5X45MM POLY (Screw) IMPLANT
SPACER HEDRON 10D 18X45X11 (Spacer) IMPLANT
STAPLER SKIN PROX 35W (STAPLE) IMPLANT
SURGIFLO W/THROMBIN 8M KIT (HEMOSTASIS) ×2 IMPLANT
SUT STRATA 3-0 15 PS-2 (SUTURE) ×4 IMPLANT
SUT VIC AB 0 CT1 27XCR 8 STRN (SUTURE) ×4 IMPLANT
SUT VIC AB 2-0 CT1 18 (SUTURE) ×4 IMPLANT
SUTURE EHLN 3-0 FS-10 30 BLK (SUTURE) IMPLANT
SYR 30ML LL (SYRINGE) ×4 IMPLANT
TAPE CLOTH 3X10 WHT NS LF (GAUZE/BANDAGES/DRESSINGS) ×8 IMPLANT
TOWEL OR 17X26 4PK STRL BLUE (TOWEL DISPOSABLE) ×4 IMPLANT
TRAP FLUID SMOKE EVACUATOR (MISCELLANEOUS) ×2 IMPLANT
TRAY FOLEY SLVR 16FR LF STAT (SET/KITS/TRAYS/PACK) ×2 IMPLANT
TUBING CONNECTING 10 (TUBING) ×2 IMPLANT
WATER STERILE IRR 1000ML POUR (IV SOLUTION) IMPLANT
WATER STERILE IRR 500ML POUR (IV SOLUTION) IMPLANT
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
## 2023-12-22 NOTE — Op Note (Signed)
 Indications: [name] is a 68 y.o. female with M43.16 Spondylolisthesis of lumbar region, M54.41, G89.29 Chronic bilateral low back pain with right-sided sciatica   Findings: expansion of disc space  Preoperative Diagnosis: M43.16 Spondylolisthesis of lumbar region, M54.41, G89.29 Chronic bilateral low back pain with right-sided sciatica  Postoperative Diagnosis: same   EBL: 50 ml IVF: see anesthesia record Drains: none Disposition: Extubated and Stable to PACU Complications: none  A foley catheter was placed.   Preoperative Note:   Risks of surgery discussed include: infection, bleeding, stroke, coma, death, paralysis, CSF leak, nerve/spinal cord injury, numbness, tingling, weakness, complex regional pain syndrome, recurrent stenosis and/or disc herniation, vascular injury, development of instability, neck/back pain, need for further surgery, persistent symptoms, development of deformity, and the risks of anesthesia. The patient understood these risks and agreed to proceed.  NAME OF ANTERIOR PROCEDURE:               1. Anterior lumbar interbody fusion via a right lateral retroperitoneal approach at L4/5 2. Placement of a Lordotic  Globus Hedron interbody cage, filled with Demineralized Bone Matrix  NAME OF POSTERIOR PROCEDURE 1. Posterior instrumentation using Nuvasive Reline Instrumentation 2. Posterolateral fusion, L4/5, utilizing demineralized bone matrix 3. Use of Stereotaxis   PROCEDURE:  Patient was brought to the operating room, intubated, turned to the lateral position.  All pressure points were checked and double-checked.  The patient was prepped and draped in the standard fashion. Prior to prepping, fluoroscopy was brought in and the patient was positioned with a large bump under the contralateral side between the iliac crest and rib cage, allowing the area between the iliac crest and the lateral aspect of the rib cage to open and increase the ability to reach  inferiorly, to facilitate entry into the disc space.  The incision was marked upon the skin both the location of the disc space as well as the superior most aspect of the iliac crest.  Based on the identification of the disc space an incision was prepared, marked upon the skin and eventually was used for our lateral incision.  The fluoroscopy was turned into a cross table A/P image in order to confirm that the patient's spine remained in a perpendicular trajectory to the floor without rotation.  Once confirming that all the pressure points were checked and double-checked and the patient remained in sturdy position strapped down in this slightly jack-knifed lateral position, the patient was prepped and draped in standard fashion.  The skin was injected with local anesthetic, then incised until the abdominal wall fascia was noted.  I bluntly dissected posteriorly until we were able to identify the posterior musculature near petit's triangle.  At this point, using primarily blunt dissection with our finger aided with a metzenbaum scissor, were able to enter the retroperitoneal cavity.  The retroperitoneal potential space was opened further until palpating out the psoas muscle, the medial aspect of the iliac crest, the medial aspect of the last rib and continued to define the retroperitoneal space with blunt dissection in order to facilitate safe placement of our dilators.    While protecting by dissecting directly onto a finger in the retroperitoneum, the retroperitoneal space was entered safely from the lateral incision and the initial dilator placed onto the muscle belly of the psoas.  While directly stimulating the dilator and after radiographically confirming our location relative to the disc space, I placed the dilator through the psoas.  The dilators were stimulated to ensure remaining safely away from  any of the lumbar plexus nerves; the dilators were repositioned until no pathologic stimulation was  appreciated.  Once I had confirmed the location of our initial dilator radiographically, a K-wire secured the dilator into the L4/5 disc space and confirmed position under A/P and lateral fluoroscopy.  At this point, I dilated up with direct stimulation to confirm lack of pathologic stimulation.  Once all the dilators were in position, I placed in the retractor and secured it onto the table, locked into position and confirmed under A/P and lateral fluoroscopy to confirm our approach angle to the disc space as well as location relative to the disc space.  I then placed the muscle stimulator in through the working channel down to the vertebral body, stimulating the entire lateral surface of the vertebral body and any of the visualized psoas muscle that was adjacent to the retractor, confirming again the safe passage to the psoas before we began performing the discectomy.  At this point, we began our discectomy at L4/5.  The disc was incised laterally throughout the extent of our exposure. Using a combination of pituitary rongeurs, Kerrison rongeurs, rasps, curettes of various sorts, we were able to begin to clean out the disc space.  Once we had cleaned out the majority of the disc space, we then cut the lateral annulus with a cob, breaking the lateral annual attachments on the contralateral side by subtly working the cob through the annulus while using flouroscopy.  Care was taken not to extend further than required after cutting the annular attachments.  After this had been performed, we prepared the endplates for placement of our graft, sized a graft to the disc space by serially dilating up in trial sizes until we confirmed that our graft would be well positioned, allowing distraction while maintaining good grip.  This was confirmed under A/P and lateral fluoroscopy in order to ensure its placement as an eventual trial for placement of our final graft.  We irrigated with saline.  Once confirmed placement, the  Hedron implant filled with allograft was impacted into position at L4/5.   Through a combination of intradiscal distraction and anterior releasing, we were able to correct the anterior deformity during disc preparation and placement of the graft.  At this point, final radiographs were performed, and we began closure.  The wound was closed using 0 Vicryl interrupted suture in the fascia and 2-0 Vicryl inverted suture were placed in the subcutaneous tissue and dermis. 3-0 monocryl was used for final closure. Dermabond was used to close the skin.    After closing the anterior part in layers, the patient was repositioned into prone position.  All pressure points were checked and double-checked.  The posterior operative site was prepped and draped in standard fashion.  The stereotactic array was placed.  Stereotactic images were acquired using intraoperative CT scanning.  This was registered to the patient.  Using stereotaxis, screw trajectories were planned and incisions made.  The pedicles from L4 to L5 were cannulated bilaterally and K wires used to secure the tracks.  We then utilized a stereotactic screwdriver to place pedicle screws from L4 to L5.  At each level, Nuvasive Reline pedicle screws were placed. During reduction, the left L4 screw was noted to pull back slightly. This was upsized and re-confirmed. The left L5 medial border was inconclusive, so I repositioned that screw.  Once the screws were placed, the screw extensions were then linked, a path was formed for the rod and a rod was  utilized to connect the screws.  We then compressed, torqued / counter-torqued and removed the screw assembly. Once performed on each side, the C-arm was brought back and to take confirmatory CT scan showing appropriate placement of all instrumentation and anatomic alignment.    Posterolateral arthrodesis was performed at L4-L5 utilizing demineralized bone matrix.  We irrigated each incision and obtained  hemostasis. Each wound was closed using 0 Vicryl interrupted suture in the fascia, 2-0 Vicryl inverted suture were placed in the subcutaneous tissue and dermis. 3-0 monocryl was used for final closure. Dermabond was used to close the skin.    Needle, lap and all counts were correct at the end of the case.     [employer] PA assisted in the entire procedure. An assistant was required for this procedure due to the complexity.  The assistant provided assistance in tissue manipulation and suction, and was required for the successful and safe performance of the procedure. I performed the critical portions of the procedure.   Reeves Daisy MD Neurosurgery

## 2023-12-22 NOTE — Anesthesia Procedure Notes (Signed)
 Procedure Name: Intubation Date/Time: 12/22/2023 1:45 PM  Performed by: Jackye Spanner, CRNAPre-anesthesia Checklist: Patient identified, Patient being monitored, Timeout performed, Emergency Drugs available and Suction available Patient Re-evaluated:Patient Re-evaluated prior to induction Oxygen Delivery Method: Circle system utilized Preoxygenation: Pre-oxygenation with 100% oxygen Induction Type: IV induction Ventilation: Mask ventilation without difficulty Laryngoscope Size: 3 and McGrath Grade View: Grade I Tube type: Oral Tube size: 6.5 mm Number of attempts: 1 Airway Equipment and Method: Stylet Placement Confirmation: ETT inserted through vocal cords under direct vision, positive ETCO2 and breath sounds checked- equal and bilateral Secured at: 20 cm Tube secured with: Tape Dental Injury: Teeth and Oropharynx as per pre-operative assessment  Comments: Smooth atraumatic intubation, no complications noted.

## 2023-12-22 NOTE — Transfer of Care (Signed)
 Immediate Anesthesia Transfer of Care Note  Patient: April Santos  Procedure(s) Performed: ANTERIOR LATERAL LUMBAR FUSION WITH PERCUTANEOUS SCREW 1 LEVEL (Spine Lumbar) APPLICATION OF INTRAOPERATIVE CT SCAN (Spine Lumbar)  Patient Location: PACU  Anesthesia Type:General  Level of Consciousness: drowsy  Airway & Oxygen Therapy: Patient Spontanous Breathing and Patient connected to face mask oxygen  Post-op Assessment: Report given to RN and Post -op Vital signs reviewed and stable  Post vital signs: Reviewed and stable  Last Vitals:  Vitals Value Taken Time  BP 123/72 12/22/23 16:18  Temp 36.1 1618  Pulse 68 12/22/23 16:21  Resp 13 12/22/23 16:21  SpO2 100 % 12/22/23 16:21  Vitals shown include unfiled device data.  Last Pain: There were no vitals filed for this visit.       Complications: No notable events documented.

## 2023-12-22 NOTE — Discharge Instructions (Signed)
 Your surgeon has performed an operation on your lumbar spine (low back) to relieve pressure on one or more nerves. Many times, patients feel better immediately after surgery and can "overdo it." Even if you feel well, it is important that you follow these activity guidelines. If you do not let your back heal properly from the surgery, you can increase the chance of hardware complications and/or return of your symptoms. The following are instructions to help in your recovery once you have been discharged from the hospital.  Do not use NSAIDs for 3 months after surgery.  *Regarding compression stockings-  Please wear day and night until you are walking a couple hundred feet three times a day.   Activity    No bending, lifting, or twisting ("BLT"). Avoid lifting objects heavier than 10 pounds (gallon milk jug).  Where possible, avoid household activities that involve lifting, bending, pushing, or pulling such as laundry, vacuuming, grocery shopping, and childcare. Try to arrange for help from friends and family for these activities while your back heals.  Increase physical activity slowly as tolerated.  Taking short walks is encouraged, but avoid strenuous exercise. Do not jog, run, bicycle, lift weights, or participate in any other exercises unless specifically allowed by your doctor. Avoid prolonged sitting, including car rides.  Talk to your doctor before resuming sexual activity.  You should not drive until cleared by your doctor.  Until released by your doctor, you should not return to work or school.  You should rest at home and let your body heal.   You may shower three days after your surgery.  After showering, lightly dab your incision dry. Do not take a tub bath or go swimming for 3 weeks, or until approved by your doctor at your follow-up appointment.  If you smoke, we strongly recommend that you quit.  Smoking has been proven to interfere with normal healing in your back and will  dramatically reduce the success rate of your surgery. Please contact [facility] (800-QUIT-NOW) and use the resources at www.[facility].com for assistance in stopping smoking.  Surgical Incision   If you have a dressing on your incision, you may remove it three days after your surgery. Keep your incision area clean and dry.  Your incision was closed with Dermabond glue. The glue should begin to peel away within about a week.  Diet            You may return to your usual diet. Be sure to stay hydrated.  When to Contact Us   Although your surgery and recovery will likely be uneventful, you may have some residual numbness, aches, and pains in your back and/or legs. This is normal and should improve in the next few weeks.  However, should you experience any of the following, contact us  immediately: New numbness or weakness Pain that is progressively getting worse, and is not relieved by your pain medications or rest Bleeding, redness, swelling, pain, or drainage from surgical incision Chills or flu-like symptoms Fever greater than 101.0 F (38.3 C) Problems with bowel or bladder functions Difficulty breathing or shortness of breath Warmth, tenderness, or swelling in your calf  Contact Information How to contact us :  If you have any questions/concerns before or after surgery, you can reach us  at [phone], or you can send a mychart message. We can be reached by phone or mychart 8am-4pm, Monday-Friday.  *Please note: Calls after 4pm are forwarded to a third party answering service. Mychart messages are not routinely monitored during evenings,  weekends, and holidays. Please call our office to contact the answering service for urgent concerns during non-business hours.

## 2023-12-22 NOTE — Anesthesia Preprocedure Evaluation (Signed)
 Anesthesia Evaluation  Patient identified by MRN, date of birth, ID band Patient awake    Reviewed: Allergy & Precautions, NPO status , Patient's Chart, lab work & pertinent test results  History of Anesthesia Complications Negative for: history of anesthetic complications  Airway Mallampati: III  TM Distance: >3 FB Neck ROM: full    Dental no notable dental hx.    Pulmonary neg pulmonary ROS   Pulmonary exam normal        Cardiovascular + CAD  Normal cardiovascular exam     Neuro/Psych  PSYCHIATRIC DISORDERS Anxiety      Neuromuscular disease    GI/Hepatic Neg liver ROS,GERD  Medicated,,  Endo/Other  negative endocrine ROS    Renal/GU      Musculoskeletal   Abdominal   Peds  Hematology negative hematology ROS (+)   Anesthesia Other Findings Past Medical History: No date: Anxiety     Comment:  a.) on BZO PRN (alprazolam ) No date: Aortic atherosclerosis (HCC) No date: CAD (coronary artery disease) No date: Cholelithiasis No date: Colon polyps 2022: COVID-19 No date: DDD (degenerative disc disease), lumbar No date: Dysphagia No date: Family history of adverse reaction to anesthesia     Comment:  a.) PONV in 1st degree relative (mother) No date: GERD (gastroesophageal reflux disease) No date: History of uterine fibroid No date: Hyperlipidemia No date: Long-term current use of immunomodulator     Comment:  a.) on DMARD therapy (apremilast ) for psoriatic               arthritis No date: Long-term use of aspirin therapy No date: Lumbar stenosis No date: Lung nodule No date: Osteoarthritis No date: Psoriatic arthritis (HCC)     Comment:  a.) Tx'd with DMARD therapy (apremilast ) No date: Varicose vein of leg No date: Venous insufficiency No date: Vitamin D  deficiency  Past Surgical History: No date: ABDOMINAL HYSTERECTOMY 1999: BREAST SURGERY     Comment:  biopsy 2005: carpal     Comment:  Tunnel  right hand 11/16/2020: CARPOMETACARPAL ([facility]) FUSION OF THUMB; Right     Comment:  Procedure: Right CARPOMETACARPAL [facility]) ARTHROPLASTY;                Surgeon: Kathlynn Sharper, MD;  Location: [facility] ORS;                Service: Orthopedics;  Laterality: Right; No date: COLONOSCOPY WITH ESOPHAGOGASTRODUODENOSCOPY (EGD) 06/10/2016: COLONOSCOPY WITH PROPOFOL ; N/A     Comment:  Procedure: COLONOSCOPY WITH PROPOFOL ;  Surgeon: Lamar ONEIDA Holmes, MD;  Location: [facility] ENDOSCOPY;  Service:               Endoscopy;  Laterality: N/A; 06/10/2016: ESOPHAGOGASTRODUODENOSCOPY (EGD) WITH PROPOFOL ; N/A     Comment:  Procedure: ESOPHAGOGASTRODUODENOSCOPY (EGD) WITH               PROPOFOL ;  Surgeon: Lamar ONEIDA Holmes, MD;  Location: [facility]              ENDOSCOPY;  Service: Endoscopy;  Laterality: N/A; 02/07/1003: FOOT SURGERY     Comment:  right foot benign tumor 03/04/2023: REVERSE SHOULDER ARTHROPLASTY; Left     Comment:  Procedure: REVERSE SHOULDER ARTHROPLASTY;  Surgeon:               Edie Norleen PARAS, MD;  Location: [facility] ORS;  Service:               Orthopedics;  Laterality: Left; 08/17/13 & 04/13/14: SACROILIAC JOINT INJECTION     Comment:  Dr. Avanell No date: SIGMOIDOSCOPY 1997: UTERINE FIBROID SURGERY No date: VARICOSE VEIN SURGERY     Reproductive/Obstetrics negative OB ROS                              Anesthesia Physical Anesthesia Plan  ASA: 2  Anesthesia Plan: General ETT   Post-op Pain Management: Toradol  IV (intra-op)*, Ofirmev  IV (intra-op)*, Dilaudid  IV and Ketamine  IV*   Induction: Intravenous  PONV Risk Score and Plan: 3 and Ondansetron , Dexamethasone , Midazolam  and Treatment may vary due to age or medical condition  Airway Management Planned: Oral ETT  Additional Equipment:   Intra-op Plan:   Post-operative Plan: Extubation in OR  Informed Consent: I have reviewed the patients History and Physical, chart, labs and discussed the procedure  including the risks, benefits and alternatives for the proposed anesthesia with the patient or authorized representative who has indicated his/her understanding and acceptance.     Dental Advisory Given  Plan Discussed with: Anesthesiologist, CRNA and Surgeon  Anesthesia Plan Comments: (Patient consented for risks of anesthesia including but not limited to:  - adverse reactions to medications - damage to eyes, teeth, lips or other oral mucosa - nerve damage due to positioning  - sore throat or hoarseness - Damage to heart, brain, nerves, lungs, other parts of body or loss of life  Patient voiced understanding and assent.)        Anesthesia Quick Evaluation

## 2023-12-23 ENCOUNTER — Encounter: Payer: Self-pay | Admitting: Neurosurgery

## 2023-12-23 MED ORDER — DIPHENHYDRAMINE HCL 25 MG PO CAPS
25.0000 mg | ORAL_CAPSULE | Freq: Four times a day (QID) | ORAL | Status: DC | PRN
  Administered 2023-12-23 – 2023-12-24 (×2): 25 mg via ORAL
  Filled 2023-12-23 (×2): qty 1

## 2023-12-23 MED ORDER — CELECOXIB 200 MG PO CAPS
200.0000 mg | ORAL_CAPSULE | Freq: Two times a day (BID) | ORAL | Status: DC
  Administered 2023-12-23 – 2023-12-24 (×3): 200 mg via ORAL
  Filled 2023-12-23 (×3): qty 1

## 2023-12-23 NOTE — Evaluation (Signed)
 Physical Therapy Evaluation Patient Details Name: April Santos MRN: [id number] DOB: 02/04/1956 Today's Date: 12/23/2023  History of Present Illness  [name] Husband is POD 1 s/p lumbar fusion at L4-5. PMH includes: CAD, anxiety, and GERD.  Clinical Impression  Pt is a pleasant 68 year old female who was admitted for lumbar fusion at L4-5. She is POD 1 on day of evaluation. Prior to sx, pt able was independent with ambulation and ADLs. Pt performs bed mobility with supervision. She was educated about using the log roll technique to get in and out of bed and demonstrated understanding. Pt able to perform transfers and ambulation with CGA using the RW. Pt limited by pain this session and was able to tolerate 9ft of ambulation. Would benefit from skilled PT to address above deficits and promote optimal return to PLOF. .         If plan is discharge home, recommend the following: A little help with walking and/or transfers;A little help with bathing/dressing/bathroom;Assistance with cooking/housework;Help with stairs or ramp for entrance   Can travel by private vehicle        Equipment Recommendations None recommended by PT  Recommendations for Other Services       Functional Status Assessment Patient has had a recent decline in their functional status and demonstrates the ability to make significant improvements in function in a reasonable and predictable amount of time.     Precautions / Restrictions Precautions Precautions: Fall Restrictions Weight Bearing Restrictions Per Provider Order: No      Mobility  Bed Mobility Overal bed mobility: Needs Assistance Bed Mobility: Rolling, Sidelying to Sit Rolling: Supervision Sidelying to sit: Supervision       General bed mobility comments: Verbal cues through log roll technique and for hand placement.    Transfers Overall transfer level: Needs assistance Equipment used: Rolling walker (2 wheels) Transfers: Sit to/from  Stand, Bed to chair/wheelchair/BSC Sit to Stand: Supervision   Step pivot transfers: Contact guard assist       General transfer comment: Verbal cues for hand placement and sequencing. Pt tends to sit with dec hip flexion, so verbal cue needed to sit further back into recliner.    Ambulation/Gait Ambulation/Gait assistance: Contact guard assist Gait Distance (Feet): 30 Feet Assistive device: Rolling walker (2 wheels) Gait Pattern/deviations: Step-through pattern Gait velocity: dec     General Gait Details: Decreased speed while ambulating in room.  Stairs            Wheelchair Mobility     Tilt Bed    Modified Rankin (Stroke Patients Only)       Balance Overall balance assessment: Needs assistance Sitting-balance support: No upper extremity supported, Feet supported Sitting balance-Leahy Scale: Normal     Standing balance support: Bilateral upper extremity supported, During functional activity, Reliant on assistive device for balance Standing balance-Leahy Scale: Good Standing balance comment: Able to perform STS from EOB using RW. No LOB.                             Pertinent Vitals/Pain Pain Assessment Pain Assessment: 0-10 Pain Score: 5  Pain Location: back Pain Descriptors / Indicators: Discomfort, Grimacing Pain Intervention(s): Limited activity within patient's tolerance    Home Living Family/patient expects to be discharged to:: Private residence Living Arrangements: Spouse/significant other Available Help at Discharge: Family;Available 24 hours/day Type of Home: House Home Access: Stairs to enter Entrance Stairs-Rails: None Entrance Stairs-Number of Steps:  1   Home Layout: One level Home Equipment: Pharmacist, hospital (2 wheels);Hand held shower head      Prior Function Prior Level of Function : Independent/Modified Independent             Mobility Comments: No AD for mobility. 2 falls in the past year d/t  dogs. ADLs Comments: Independent     Extremity/Trunk Assessment   Upper Extremity Assessment Upper Extremity Assessment: Overall WFL for tasks assessed    Lower Extremity Assessment Lower Extremity Assessment: Overall WFL for tasks assessed    Cervical / Trunk Assessment Cervical / Trunk Assessment: Back Surgery  Communication   Communication Communication: No apparent difficulties    Cognition Arousal: Alert Behavior During Therapy: WFL for tasks assessed/performed   PT - Cognitive impairments: No apparent impairments                       PT - Cognition Comments: Pleasant and agreeable to PT session Following commands: Intact       Cueing Cueing Techniques: Verbal cues     General Comments      Exercises Other Exercises Other Exercises: Education about log roll technique.   Assessment/Plan    PT Assessment Patient needs continued PT services  PT Problem List Decreased strength;Decreased activity tolerance;Decreased balance;Decreased range of motion;Decreased mobility;Decreased knowledge of use of DME;Pain       PT Treatment Interventions DME instruction;Gait training;Stair training;Functional mobility training;Therapeutic activities;Therapeutic exercise;Balance training;Neuromuscular re-education;Patient/family education    PT Goals (Current goals can be found in the Care Plan section)  Acute Rehab PT Goals Patient Stated Goal: to go home and have outpatient PT PT Goal Formulation: With patient Time For Goal Achievement: 01/06/24 Potential to Achieve Goals: Good    Frequency 7X/week     Co-evaluation               AM-PAC PT 6 Clicks Mobility  Outcome Measure Help needed turning from your back to your side while in a flat bed without using bedrails?: A Little Help needed moving from lying on your back to sitting on the side of a flat bed without using bedrails?: A Little Help needed moving to and from a bed to a chair (including a  wheelchair)?: None Help needed standing up from a chair using your arms (e.g., wheelchair or bedside chair)?: None Help needed to walk in hospital room?: None Help needed climbing 3-5 steps with a railing? : A Little 6 Click Score: 21    End of Session Equipment Utilized During Treatment: Gait belt Activity Tolerance: Patient tolerated treatment well Patient left: in chair;with call bell/phone within reach;with chair alarm set Nurse Communication: Mobility status PT Visit Diagnosis: Difficulty in walking, not elsewhere classified (R26.2)    Time: 9155-9093 PT Time Calculation (min) (ACUTE ONLY): 22 min   Charges:                 Avelyn Touch, SPT   Giulietta Prokop 12/23/2023, 10:34 AM

## 2023-12-23 NOTE — Evaluation (Signed)
 Occupational Therapy Evaluation Patient Details Name: April Santos MRN: [id number] DOB: 1955-07-07 Today's Date: 12/23/2023   History of Present Illness   April Santos Husband is POD 1 s/p lumbar fusion at L4-5. PMH includes: CAD, anxiety, and GERD.   Clinical Impressions April Santos was seen for OT evaluation this date. Prior to hospital admission, pt was IND. Pt lives with significant other. Pt currently requires MOD A don B socks in sitting. SUPERVISION + RW for toilet t/f , pericare, and standing handwashing. Pt educated in functional application of back precautions, AE/DME for bathing/dressing/ toileting, and home/routines modifications. Pt verbalized understanding of all education/training provided. All education complete, will sign off. Upon hospital discharge, recommend no OT follow up.     If plan is discharge home, recommend the following:   Help with stairs or ramp for entrance;A little help with bathing/dressing/bathroom     Functional Status Assessment   Patient has had a recent decline in their functional status and demonstrates the ability to make significant improvements in function in a reasonable and predictable amount of time.     Equipment Recommendations   Other (comment) (RW)     Recommendations for Other Services         Precautions/Restrictions   Precautions Precautions: Fall;Back Recall of Precautions/Restrictions: Intact Restrictions Weight Bearing Restrictions Per Provider Order: No     Mobility Bed Mobility               General bed mobility comments: not tested    Transfers Overall transfer level: Needs assistance Equipment used: Rolling walker (2 wheels) Transfers: Sit to/from Stand Sit to Stand: Supervision           General transfer comment: cues hand placement      Balance Overall balance assessment: Needs assistance Sitting-balance support: No upper extremity supported, Feet supported Sitting balance-Leahy  Scale: Normal     Standing balance support: No upper extremity supported, During functional activity Standing balance-Leahy Scale: Good                             ADL either performed or assessed with clinical judgement   ADL Overall ADL's : Needs assistance/impaired                                       General ADL Comments: MOD A don B socks in sitting. SUPERVISION + RW for toilet t/f , pericare, and standing handwashing.      Pertinent Vitals/Pain Pain Assessment Pain Assessment: 0-10 Pain Score: 6  Pain Location: back Pain Descriptors / Indicators: Discomfort, Grimacing Pain Intervention(s): Limited activity within patient's tolerance, Repositioned, Premedicated before session     Extremity/Trunk Assessment Upper Extremity Assessment Upper Extremity Assessment: Overall WFL for tasks assessed   Lower Extremity Assessment Lower Extremity Assessment: Overall WFL for tasks assessed       Communication Communication Communication: No apparent difficulties   Cognition Arousal: Alert Behavior During Therapy: WFL for tasks assessed/performed Cognition: No apparent impairments                               Following commands: Intact                  Home Living Family/patient expects to be discharged to:: Private residence Living Arrangements: Spouse/significant other Available  Help at Discharge: Family;Available 24 hours/day Type of Home: House Home Access: Stairs to enter [employer] of Steps: 1 Entrance Stairs-Rails: None Home Layout: One level     Bathroom Shower/Tub: Walk-in shower;Tub/shower unit   Bathroom Toilet: Handicapped height     Home Equipment: Pharmacist, hospital (2 wheels);Hand held shower head          Prior Functioning/Environment Prior Level of Function : Independent/Modified Independent                    OT Problem List: Decreased activity  tolerance;Decreased range of motion        OT Goals(Current goals can be found in the care plan section)   Acute Rehab OT Goals Patient Stated Goal: to go home OT Goal Formulation: With patient Time For Goal Achievement: 12/23/23 Potential to Achieve Goals: Good   AM-PAC OT 6 Clicks Daily Activity     Outcome Measure Help from another person eating meals?: None Help from another person taking care of personal grooming?: None Help from another person toileting, which includes using toliet, bedpan, or urinal?: A Little Help from another person bathing (including washing, rinsing, drying)?: A Little Help from another person to put on and taking off regular upper body clothing?: None Help from another person to put on and taking off regular lower body clothing?: A Lot 6 Click Score: 20   End of Session Equipment Utilized During Treatment: Rolling walker (2 wheels) Nurse Communication: Mobility status  Activity Tolerance: Patient tolerated treatment well Patient left: in chair;with call bell/phone within reach;with nursing/sitter in room  OT Visit Diagnosis: Other abnormalities of gait and mobility (R26.89);Muscle weakness (generalized) (M62.81)                Time: 9088-9069 OT Time Calculation (min): 19 min Charges:  OT General Charges $OT Visit: 1 Visit OT Evaluation $OT Eval Low Complexity: 1 Low OT Treatments $Self Care/Home Management : 8-22 mins  Elston Slot, M.S. OTR/L  12/23/23, 10:13 AM  ascom [phone]

## 2023-12-23 NOTE — Plan of Care (Signed)

## 2023-12-23 NOTE — Plan of Care (Signed)
  Problem: Education: Goal: Knowledge of General Education information will improve Description: Including pain rating scale, medication(s)/side effects and non-pharmacologic comfort measures Outcome: Progressing   Problem: Clinical Measurements: Goal: Diagnostic test results will improve Outcome: Progressing   Problem: Coping: Goal: Level of anxiety will decrease Outcome: Progressing   Problem: Pain Managment: Goal: General experience of comfort will improve and/or be controlled Outcome: Progressing   Problem: Clinical Measurements: Goal: Diagnostic test results will improve Outcome: Progressing

## 2023-12-23 NOTE — Progress Notes (Signed)
 Neurosurgery Progress Note  History: IDALIS HOELTING is here for spondylolisthesis  POD1: Doing well. Expected pain  Physical Exam: Vitals:   12/22/23 1950 12/23/23 0400  BP: 112/70 100/64  Pulse: 61 (!) 55  Resp: 17 18  Temp: 98.1 F (36.7 C) 97.8 F (36.6 C)  SpO2: 100% 99%    AA Ox3 CNI  Strength:5/5 throughout BLE Dressing c/d/i  Data:  Other tests/results: n/a  Assessment/Plan:  April Santos is doing well after surgery  - mobilize - pain control - DVT prophylaxis - PTOT   Reeves Daisy MD, [facility] Department of Neurosurgery

## 2023-12-24 ENCOUNTER — Other Ambulatory Visit: Payer: Self-pay

## 2023-12-24 MED ORDER — CELECOXIB 200 MG PO CAPS
200.0000 mg | ORAL_CAPSULE | Freq: Two times a day (BID) | ORAL | 0 refills | Status: DC
  Filled 2023-12-24: qty 30, 15d supply, fill #0

## 2023-12-24 MED ORDER — SENNA 8.6 MG PO TABS
1.0000 | ORAL_TABLET | Freq: Two times a day (BID) | ORAL | 0 refills | Status: DC | PRN
  Filled 2023-12-24: qty 30, 15d supply, fill #0

## 2023-12-24 MED ORDER — METHOCARBAMOL 500 MG PO TABS
500.0000 mg | ORAL_TABLET | Freq: Four times a day (QID) | ORAL | 0 refills | Status: DC | PRN
  Filled 2023-12-24: qty 120, 30d supply, fill #0

## 2023-12-24 MED ORDER — POLYETHYLENE GLYCOL 3350 17 G PO PACK: 17.0000 g | PACK | Freq: Every day | ORAL | Status: AC | PRN

## 2023-12-24 MED ORDER — DIPHENHYDRAMINE HCL 25 MG PO CAPS: 25.0000 mg | ORAL_CAPSULE | Freq: Four times a day (QID) | ORAL | Status: AC | PRN

## 2023-12-24 MED ORDER — ACETAMINOPHEN 500 MG PO TABS: 1000.0000 mg | ORAL_TABLET | Freq: Four times a day (QID) | ORAL | Status: AC | PRN

## 2023-12-24 MED ORDER — OXYCODONE HCL 5 MG PO TABS
5.0000 mg | ORAL_TABLET | ORAL | 0 refills | Status: DC | PRN
  Filled 2023-12-24: qty 30, 5d supply, fill #0

## 2023-12-24 NOTE — Progress Notes (Signed)
 Physical Therapy Treatment Patient Details Name: April Santos MRN: [id number] DOB: Feb 16, 1956 Today's Date: 12/24/2023   History of Present Illness April Santos is POD 1 s/p lumbar fusion at L4-5. PMH includes: CAD, anxiety, and GERD.    PT Comments  Pt received speaking with PA upon arrival. Pt able to ambulate 266ft using RW with supervision. Pt educated about stairs and demonstrates ability to safely negotiate stairs in order to enter and exit her home. Pt reports that she was able to dress herself in sitting prior to PT arrival. Would benefit from skilled PT to address above deficits and promote optimal return to PLOF.    If plan is discharge home, recommend the following: A little help with walking and/or transfers;A little help with bathing/dressing/bathroom;Assistance with cooking/housework;Help with stairs or ramp for entrance   Can travel by private vehicle        Equipment Recommendations  None recommended by PT    Recommendations for Other Services       Precautions / Restrictions Precautions Precautions: Fall;Back Recall of Precautions/Restrictions: Intact Restrictions Weight Bearing Restrictions Per Provider Order: No     Mobility  Bed Mobility Overal bed mobility: Independent             General bed mobility comments: NT. Pt received sitting at EOB this morning. Reports she was able to dress herself prior to PT session    Transfers Overall transfer level: Modified independent Equipment used: Rolling walker (2 wheels) Transfers: Sit to/from Stand Sit to Stand: Modified independent (Device/Increase time)   Step pivot transfers: Supervision       General transfer comment: demonstrates understanding of hand palcement and safe practice with transfers. Use of RW    Ambulation/Gait Ambulation/Gait assistance: Modified independent (Device/Increase time) Gait Distance (Feet): 200 Feet Assistive device: Rolling walker (2 wheels) Gait  Pattern/deviations: WFL(Within Functional Limits) Gait velocity: dec     General Gait Details: Demos proper use of RW with ambulation. Step-through pattern throughout. No LOB   Stairs Stairs: Yes Stairs assistance: Supervision Stair Management: No rails, With walker, Step to pattern Number of Stairs: 1 General stair comments: 1 step to get inside her home. Able to place RW fully onto step without difficulty. No LOB   Wheelchair Mobility     Tilt Bed    Modified Rankin (Stroke Patients Only)       Balance Overall balance assessment: Modified Independent Sitting-balance support: No upper extremity supported, Feet supported Sitting balance-Leahy Scale: Normal     Standing balance support: Bilateral upper extremity supported, During functional activity, Reliant on assistive device for balance Standing balance-Leahy Scale: Good Standing balance comment: Pt reports ability to dress herself in sitting prior to PT session. No LOB with prolonged standing while using RW                            Communication Communication Communication: No apparent difficulties  Cognition Arousal: Alert Behavior During Therapy: WFL for tasks assessed/performed   PT - Cognitive impairments: No apparent impairments                       PT - Cognition Comments: Pleasant and agreeable to PT session Following commands: Intact      Cueing Cueing Techniques: Verbal cues  Exercises Other Exercises Other Exercises: Education about log roll technique/spinal precautions.    General Comments        Pertinent Vitals/Pain Pain Assessment Pain  Assessment: No/denies pain Pain Score: 0-No pain Pain Intervention(s): Premedicated before session    Home Living                          Prior Function            PT Goals (current goals can now be found in the care plan section) Acute Rehab PT Goals Patient Stated Goal: to go home PT Goal Formulation: With  patient Time For Goal Achievement: 01/06/24 Potential to Achieve Goals: Good Progress towards PT goals: Progressing toward goals    Frequency    7X/week      PT Plan      Co-evaluation              AM-PAC PT 6 Clicks Mobility   Outcome Measure  Help needed turning from your back to your side while in a flat bed without using bedrails?: None Help needed moving from lying on your back to sitting on the side of a flat bed without using bedrails?: None Help needed moving to and from a bed to a chair (including a wheelchair)?: None Help needed standing up from a chair using your arms (e.g., wheelchair or bedside chair)?: None Help needed to walk in hospital room?: None Help needed climbing 3-5 steps with a railing? : A Little 6 Click Score: 23    End of Session   Activity Tolerance: Patient tolerated treatment well Patient left: with call bell/phone within reach;in bed Nurse Communication: Mobility status PT Visit Diagnosis: Difficulty in walking, not elsewhere classified (R26.2)     Time: 9071-9045 PT Time Calculation (min) (ACUTE ONLY): 26 min  Charges:    $Gait Training: 23-37 mins PT General Charges $$ ACUTE PT VISIT: 1 Visit                     Luvenia Avers, SPT    April Santos 12/24/2023, 12:59 PM

## 2023-12-24 NOTE — Progress Notes (Signed)
 Neurosurgery Progress Note  History: April Santos is here for spondylolisthesis  POD2: doing well this morning. Reports she has not had a BM since Monday but is passing gas and denies any abdominal pain POD1: Doing well. Expected pain  Physical Exam: Vitals:   12/24/23 0421 12/24/23 0811  BP: 115/72 106/65  Pulse: 76 (!) 59  Resp: 18 18  Temp: 98 F (36.7 C) 97.8 F (36.6 C)  SpO2: 94% 100%    AA Ox3 CNI  Strength:5/5 throughout BLE Dressing c/d/i  Data:  Other tests/results: n/a  Assessment/Plan:  April Santos is doing well after surgery  - mobilize - pain control - DVT prophylaxis - PTOT; recommending rolling walker. Pt reports having appropriate DME at home. Is hoping to avoid HH  Edsel Goods PA-C Department of Neurosurgery

## 2023-12-24 NOTE — Discharge Summary (Signed)
 Discharge Summary  Patient ID: April Santos MRN: [id number] DOB/AGE: [birth year] 69 y.o.  Admit date: 12/22/2023 Discharge date: 12/24/2023  Admission Diagnoses: M43.16 Spondylolisthesis of lumbar region, M54.41, G89.29 Chronic bilateral low back pain with right-sided sciatica   Discharge Diagnoses:  Principal Problem:   S/P lumbar fusion Active Problems:   Spondylolisthesis of lumbar region   Chronic bilateral low back pain with right-sided sciatica   Discharged Condition: good  Hospital Course:  April Santos is a 68 y.o presenting with lumbar spondylolisthesis and right sided sciatica s/p L4-5 XLIF and PSF. Her intraoperative course was uncomplicated. She was admitted for pain control and therapy evaluation. She was seen by therapy and deemed appropriate for discharge home without further needs on POD2. Postoperative she experienced some itching around her right flank incision without any significant rash. She was instructed to take OTC antihistamines as needed. She was given prescriptions for Oxycodone , Robaxin , and Senna to take a needed.   Consults: None  Significant Diagnostic Studies: NA  Treatments: surgery: as above. Please see separately dictated operative report for further details   Discharge Exam: Blood pressure 106/65, pulse (!) 59, temperature 97.8 F (36.6 C), resp. rate 18, last menstrual period 02/26/2002, SpO2 100%.  AA Ox3 CNI   Strength:5/5 throughout BLE Dressing c/d/i  Disposition: Discharge disposition: 01-Home or Self Care       Discharge Instructions     Incentive spirometry RT   Complete by: As directed    Remove dressing in 24 hours   Complete by: As directed       Allergies as of 12/24/2023       Reactions   Cefdinir Hives, Itching   Has tolerated 1st generation cephalosporin (CEFAZOLIN ) with no documented ADRs   Erythromycin Ethylsuccinate [erythromycin] Other (See Comments)   Unknown        Medication List     STOP  taking these medications    HYDROcodone -acetaminophen  5-325 MG tablet Commonly known as: NORCO/VICODIN   ibuprofen 200 MG tablet Commonly known as: ADVIL       TAKE these medications    acetaminophen  500 MG tablet Commonly known as: TYLENOL  Take 2 tablets (1,000 mg total) by mouth every 6 (six) hours as needed.   ALPRAZolam  0.25 MG tablet Commonly known as: XANAX  Take 1 tablet (0.25 mg total) by mouth daily as needed. What changed:  how much to take when to take this   amoxicillin 500 MG capsule Commonly known as: AMOXIL Take 1,000 mg by mouth 2 (two) times daily. What changed:  how much to take when to take this additional instructions   aspirin EC 81 MG tablet Take 81 mg by mouth daily. Swallow whole.   B-complex with vitamin C tablet Take 1 tablet by mouth daily.   Benefiber Chew Chew 2 tablets by mouth every morning. Gummy   celecoxib  200 MG capsule Commonly known as: CELEBREX  Take 1 capsule (200 mg total) by mouth 2 (two) times daily.   cholecalciferol 25 MCG (1000 UNIT) tablet Commonly known as: VITAMIN D3 Take 1,000 Units by mouth daily.   desonide  0.05 % cream Commonly known as: DESOWEN  Apply 1 Application topically 2 (two) times daily as needed (psoriasis). For 1-2 wks   diphenhydrAMINE  25 mg capsule Commonly known as: BENADRYL  Take 1 capsule (25 mg total) by mouth every 6 (six) hours as needed for itching.   estradiol  0.1 MG/GM vaginal cream Commonly known as: ESTRACE  VAGINAL Insert 1 gram vaginally 2 times weekly as maintenance  Eucrisa  2 % Oint Generic drug: Crisaborole  Apply 1 application topically 2 (two) times daily as needed (psoriasis).   gabapentin  300 MG capsule Commonly known as: NEURONTIN  Take 300 mg by mouth See admin instructions. Take 300 mg by mouth in the morning and at noon, and take 600 mg at bedtime.   guaiFENesin -codeine  100-10 MG/5ML syrup Take 5 mLs by mouth at bedtime as needed for cough.   HAIR SKIN NAILS  GUMMIES PO Take 2 tablets by mouth daily. Olly   hydrocortisone  cream 1 % Apply 1 Application topically 2 (two) times daily as needed for itching.   methocarbamol  500 MG tablet Commonly known as: ROBAXIN  Take 1 tablet (500 mg total) by mouth every 6 (six) hours as needed for muscle spasms.   multivitamin with minerals tablet Take 1 tablet by mouth daily. Olly   mupirocin  ointment 2 % Commonly known as: BACTROBAN  Apply 1 Application topically daily as needed (Wound).   Otezla  30 MG Tabs Generic drug: Apremilast  Take 1 tablet (30 mg total) by mouth daily.   oxyCODONE  5 MG immediate release tablet Commonly known as: Oxy IR/ROXICODONE  Take 1 tablet (5 mg total) by mouth every 4 (four) hours as needed for moderate pain (pain score 4-6).   polyethylene glycol 17 g packet Commonly known as: MIRALAX  / GLYCOLAX  Take 17 g by mouth daily as needed for mild constipation.   RABEprazole  20 MG tablet Commonly known as: ACIPHEX  Take 1 tablet (20 mg total) by mouth 2 (two) times daily. What changed: when to take this   rosuvastatin  5 MG tablet Commonly known as: Crestor  Take 1 tablet (5 mg total) by mouth daily.   senna 8.6 MG Tabs tablet Commonly known as: SENOKOT Take 1 tablet (8.6 mg total) by mouth 2 (two) times daily as needed for mild constipation.   triamcinolone  cream 0.1 % Commonly known as: KENALOG  Apply 1 Application topically 2 (two) times daily as needed. For up to 2 weeks. Avoid applying to face, groin, and axilla. Use as directed. Long-term use can cause thinning of the skin.   Vitamin B-12 3000 MCG Subl Place 6,000 mcg under the tongue daily at 2 am. Gummy         Signed: Edsel Jama Goods 12/24/2023, 10:14 AM

## 2023-12-24 NOTE — Progress Notes (Signed)
 1036 D/C AVS completed and reviewed with pt. All opportunities for questions answered and clarified. IV removed. Pt will be wheeled down to car at [facility] mall entrance via wheelchair.  D/c barrier: Meds to beds.

## 2023-12-25 ENCOUNTER — Telehealth: Payer: Self-pay

## 2023-12-25 NOTE — Anesthesia Postprocedure Evaluation (Signed)
 Anesthesia Post Note  Patient: April Santos  Procedure(s) Performed: ANTERIOR LATERAL LUMBAR FUSION WITH PERCUTANEOUS SCREW 1 LEVEL (Spine Lumbar) APPLICATION OF INTRAOPERATIVE CT SCAN (Spine Lumbar)  Patient location during evaluation: PACU Anesthesia Type: General Level of consciousness: awake and alert Pain management: pain level controlled Vital Signs Assessment: post-procedure vital signs reviewed and stable Respiratory status: spontaneous breathing, nonlabored ventilation, respiratory function stable and patient connected to nasal cannula oxygen Cardiovascular status: blood pressure returned to baseline and stable Postop Assessment: no apparent nausea or vomiting Anesthetic complications: no   No notable events documented.   Last Vitals:  Vitals:   12/24/23 0421 12/24/23 0811  BP: 115/72 106/65  Pulse: 76 (!) 59  Resp: 18 18  Temp: 36.7 C 36.6 C  SpO2: 94% 100%    Last Pain:  Vitals:   12/24/23 0834  TempSrc:   PainSc: 6                  Prentice Murphy

## 2023-12-25 NOTE — Transitions of Care (Post Inpatient/ED Visit) (Signed)
   12/25/2023  Name: April Santos MRN: [id number] DOB: May 12, 1955  Today's TOC FU Call Status: Today's TOC FU Call Status:: Unsuccessful Call (1st Attempt) Unsuccessful Call (1st Attempt) Date: 12/25/23  Attempted to reach the patient regarding the most recent Inpatient/ED visit.  Follow Up Plan: Additional outreach attempts will be made to reach the patient to complete the Transitions of Care (Post Inpatient/ED visit) call.   Arvin Seip RN, BSN, CCM [employer], Population Health Case Manager Phone: [phone]

## 2023-12-26 ENCOUNTER — Telehealth: Payer: Self-pay

## 2023-12-26 NOTE — Transitions of Care (Post Inpatient/ED Visit) (Signed)
   12/26/2023  Name: NAYDENE KAMROWSKI MRN: [id number] DOB: 13-Aug-1955  Today's TOC FU Call Status: Today's TOC FU Call Status:: Unsuccessful Call (2nd Attempt)  Attempted to reach the patient regarding the most recent Inpatient/ED visit.  Follow Up Plan: Additional outreach attempts will be made to reach the patient to complete the Transitions of Care (Post Inpatient/ED visit) call.   Deniese Oberry J. Caryle Helgeson RN, MSN [facility], [facility] RN Care Manager Direct Dial: [phone]  Fax: [phone] Website: [email or web address]

## 2023-12-29 ENCOUNTER — Telehealth: Payer: Self-pay

## 2023-12-29 NOTE — Transitions of Care (Post Inpatient/ED Visit) (Signed)
   12/29/2023  Name: ROXANNA MCEVER MRN: [id number] DOB: Apr 17, 1955  Today's TOC FU Call Status: Today's TOC FU Call Status:: Unsuccessful Call (3rd Attempt) Unsuccessful Call (3rd Attempt) Date: 12/29/23  Attempted to reach the patient regarding the most recent Inpatient/ED visit.  Follow Up Plan: No further outreach attempts will be made at this time. We have been unable to contact the patient.  Kyllian Clingerman J. Kristion Holifield RN, MSN [facility], [facility] RN Care Manager Direct Dial: [phone]  Fax: [phone] Website: [email or web address]

## 2023-12-30 ENCOUNTER — Encounter: Payer: Self-pay | Admitting: Internal Medicine

## 2023-12-31 ENCOUNTER — Encounter: Payer: Self-pay | Admitting: Internal Medicine

## 2023-12-31 NOTE — Telephone Encounter (Signed)
 Additional info to last message

## 2023-12-31 NOTE — Telephone Encounter (Signed)
 Ok to work in as we discussed.

## 2023-12-31 NOTE — Telephone Encounter (Signed)
 Do you want to work her in virtual since she has not been cleared to drive?

## 2023-12-31 NOTE — Telephone Encounter (Signed)
 LM for patient

## 2023-12-31 NOTE — Telephone Encounter (Signed)
 See attached. Ok for work in as we discussed

## 2023-12-31 NOTE — Telephone Encounter (Signed)
 Pt is scheduled for tomorrow at 11:30 (virtually)

## 2024-01-01 ENCOUNTER — Telehealth: Admitting: Internal Medicine

## 2024-01-01 VITALS — Ht 61.0 in | Wt 133.0 lb

## 2024-01-01 DIAGNOSIS — L4050 Arthropathic psoriasis, unspecified: Secondary | ICD-10-CM | POA: Diagnosis not present

## 2024-01-01 DIAGNOSIS — Z9889 Other specified postprocedural states: Secondary | ICD-10-CM | POA: Diagnosis not present

## 2024-01-01 DIAGNOSIS — R21 Rash and other nonspecific skin eruption: Secondary | ICD-10-CM | POA: Diagnosis not present

## 2024-01-01 NOTE — Telephone Encounter (Signed)
 April Santos was seen today in the office.

## 2024-01-01 NOTE — Progress Notes (Signed)
 Patient ID: April Santos, female   DOB: 03/01/1956, 68 y.o.   MRN: [id number]   Virtual Visit via video Note  I connected with Devere Husband by a video enabled telemedicine application and verified that I am speaking with the correct person using two identifiers. Location patient: home Location provider: work Persons participating in the virtual visit: patient, provider  The limitations, risks, security and privacy concerns of performing an evaluation and management service by video and the availability of in person appointments have been discussed. It has also bene discussed with the patient that there may be a patient responsible charge related to this service. The patient expressed understanding and agreed to proceed.   Reason for visit: work in appt.   HPI: Work in with concerns regarding possible ringworm. Was admitted 12/22/23 - 12/24/23 - s/p L4-5 XLIF and PSF. Having some pain - s/p surgery. Continues to f/u with [facility].  Reports was out spreading mulch. Noticed leg itching and circular rash after. Localized to left posterior calf. Has been using lamisil cream with noted improvement. Started the day before yesterday. No other rash. No fever.    ROS: See pertinent positives and negatives per HPI.  Past Medical History:  Diagnosis Date   Anxiety    a.) on BZO PRN (alprazolam )   Aortic atherosclerosis    CAD (coronary artery disease)    Cholelithiasis    Colon polyps    COVID-19 2022   DDD (degenerative disc disease), lumbar    Dysphagia    Family history of adverse reaction to anesthesia    a.) PONV in 1st degree relative (mother)   GERD (gastroesophageal reflux disease)    History of uterine fibroid    Hyperlipidemia    Long-term current use of immunomodulator    a.) on DMARD therapy (apremilast ) for psoriatic arthritis   Long-term use of aspirin therapy    Lumbar stenosis    Lung nodule    Osteoarthritis    Psoriatic arthritis (HCC)    a.) Tx'd with DMARD therapy  (apremilast )   Varicose vein of leg    Venous insufficiency    Vitamin D  deficiency     Past Surgical History:  Procedure Laterality Date   ABDOMINAL HYSTERECTOMY     ANTERIOR LATERAL LUMBAR FUSION WITH PERCUTANEOUS SCREW 1 LEVEL N/A 12/22/2023   Procedure: ANTERIOR LATERAL LUMBAR FUSION WITH PERCUTANEOUS SCREW 1 LEVEL;  Surgeon: Clois Fret, MD;  Location: [facility] ORS;  Service: Neurosurgery;  Laterality: N/A;  L4-5 LATERAL LUMBAR INTERBODY FUSION AND POSTERIOR SPINAL FUSION   APPLICATION OF INTRAOPERATIVE CT SCAN N/A 12/22/2023   Procedure: APPLICATION OF INTRAOPERATIVE CT SCAN;  Surgeon: Clois Fret, MD;  Location: [facility] ORS;  Service: Neurosurgery;  Laterality: N/A;   BREAST SURGERY  1999   biopsy   carpal  2005   Tunnel right hand   CARPOMETACARPAL (CMC) FUSION OF THUMB Right 11/16/2020   Procedure: Right CARPOMETACARPAL [facility]) ARTHROPLASTY;  Surgeon: Kathlynn Sharper, MD;  Location: [facility] ORS;  Service: Orthopedics;  Laterality: Right;   COLONOSCOPY WITH ESOPHAGOGASTRODUODENOSCOPY (EGD)     COLONOSCOPY WITH PROPOFOL  N/A 06/10/2016   Procedure: COLONOSCOPY WITH PROPOFOL ;  Surgeon: Lamar ONEIDA Holmes, MD;  Location: [facility] ENDOSCOPY;  Service: Endoscopy;  Laterality: N/A;   ESOPHAGOGASTRODUODENOSCOPY (EGD) WITH PROPOFOL  N/A 06/10/2016   Procedure: ESOPHAGOGASTRODUODENOSCOPY (EGD) WITH PROPOFOL ;  Surgeon: Lamar ONEIDA Holmes, MD;  Location: [facility] ENDOSCOPY;  Service: Endoscopy;  Laterality: N/A;   FOOT SURGERY  02/07/1003   right foot benign tumor   REVERSE  SHOULDER ARTHROPLASTY Left 03/04/2023   Procedure: REVERSE SHOULDER ARTHROPLASTY;  Surgeon: Edie Norleen PARAS, MD;  Location: [facility] ORS;  Service: Orthopedics;  Laterality: Left;   SACROILIAC JOINT INJECTION  08/17/13 & 04/13/14   Dr. Avanell   SIGMOIDOSCOPY     UTERINE FIBROID SURGERY  1997   VARICOSE VEIN SURGERY      Family History  Problem Relation Age of Onset   Breast cancer Mother        62/74   Heart attack Father     Arthritis Father    GER disease Father    Coronary artery disease Father        44   Diabetes Brother    Diabetes Maternal Uncle    Colon cancer Neg Hx     SOCIAL HX: reviewed.    Current Outpatient Medications:    acetaminophen  (TYLENOL ) 500 MG tablet, Take 2 tablets (1,000 mg total) by mouth every 6 (six) hours as needed., Disp: , Rfl:    ALPRAZolam  (XANAX ) 0.25 MG tablet, Take 1 tablet (0.25 mg total) by mouth daily as needed. (Patient taking differently: Take 0.125 mg by mouth at bedtime.), Disp: 15 tablet, Rfl: 0   amoxicillin (AMOXIL) 500 MG capsule, Take 1,000 mg by mouth 2 (two) times daily. (Patient taking differently: Take 2,000 mg by mouth See admin instructions. Take 2000 mg by mouth prior to dental procedures), Disp: , Rfl:    Apremilast  (OTEZLA ) 30 MG TABS, Take 1 tablet (30 mg total) by mouth daily., Disp: 30 tablet, Rfl: 5   aspirin EC 81 MG tablet, Take 81 mg by mouth daily. Swallow whole., Disp: , Rfl:    B Complex-C (B-COMPLEX WITH VITAMIN C) tablet, Take 1 tablet by mouth daily., Disp: , Rfl:    Biotin-Vitamin C (HAIR SKIN NAILS GUMMIES PO), Take 2 tablets by mouth daily. Olly, Disp: , Rfl:    celecoxib  (CELEBREX ) 200 MG capsule, Take 1 capsule (200 mg total) by mouth 2 (two) times daily., Disp: 30 capsule, Rfl: 0   cholecalciferol (VITAMIN D3) 25 MCG (1000 UNIT) tablet, Take 1,000 Units by mouth daily., Disp: , Rfl:    Crisaborole  (EUCRISA ) 2 % OINT, Apply 1 application topically 2 (two) times daily as needed (psoriasis)., Disp: 60 g, Rfl: 1   Cyanocobalamin (VITAMIN B-12) 3000 MCG SUBL, Place 6,000 mcg under the tongue daily at 2 am. Gummy, Disp: , Rfl:    desonide  (DESOWEN ) 0.05 % cream, Apply 1 Application topically 2 (two) times daily as needed (psoriasis). For 1-2 wks, Disp: 15 g, Rfl: 0   diphenhydrAMINE  (BENADRYL ) 25 mg capsule, Take 1 capsule (25 mg total) by mouth every 6 (six) hours as needed for itching., Disp: , Rfl:    estradiol  (ESTRACE  VAGINAL) 0.1 MG/GM  vaginal cream, Insert 1 gram vaginally 2 times weekly as maintenance, Disp: 42.5 g, Rfl: 2   gabapentin  (NEURONTIN ) 300 MG capsule, Take 300 mg by mouth See admin instructions. Take 300 mg by mouth in the morning and at noon, and take 600 mg at bedtime., Disp: , Rfl:    hydrocortisone  cream 1 %, Apply 1 Application topically 2 (two) times daily as needed for itching., Disp: , Rfl:    methocarbamol  (ROBAXIN ) 500 MG tablet, Take 1 tablet (500 mg total) by mouth every 6 (six) hours as needed for muscle spasms., Disp: 120 tablet, Rfl: 0   Multiple Vitamins-Minerals (MULTIVITAMIN WITH MINERALS) tablet, Take 1 tablet by mouth daily. Olly, Disp: , Rfl:    mupirocin  ointment (BACTROBAN )  2 %, Apply 1 Application topically daily as needed (Wound)., Disp: , Rfl:    oxyCODONE  (OXY IR/ROXICODONE ) 5 MG immediate release tablet, Take 1 tablet (5 mg total) by mouth every 4 (four) hours as needed for moderate pain (pain score 4-6)., Disp: 30 tablet, Rfl: 0   polyethylene glycol (MIRALAX  / GLYCOLAX ) 17 g packet, Take 17 g by mouth daily as needed for mild constipation., Disp: , Rfl:    RABEprazole  (ACIPHEX ) 20 MG tablet, Take 1 tablet (20 mg total) by mouth 2 (two) times daily. (Patient taking differently: Take 20 mg by mouth daily.), Disp: , Rfl:    rosuvastatin  (CRESTOR ) 5 MG tablet, Take 1 tablet (5 mg total) by mouth daily., Disp: 90 tablet, Rfl: 1   senna (SENOKOT) 8.6 MG TABS tablet, Take 1 tablet (8.6 mg total) by mouth 2 (two) times daily as needed for mild constipation., Disp: 30 tablet, Rfl: 0   triamcinolone  cream (KENALOG ) 0.1 %, Apply 1 Application topically 2 (two) times daily as needed. For up to 2 weeks. Avoid applying to face, groin, and axilla. Use as directed. Long-term use can cause thinning of the skin., Disp: 80 g, Rfl: 1   Wheat Dextrin (BENEFIBER) CHEW, Chew 2 tablets by mouth every morning. Gummy, Disp: , Rfl:   EXAM:  GENERAL: alert, oriented, appears well and in no acute distress  HEENT:  atraumatic, conjunttiva clear, no obvious abnormalities on inspection of external nose and ears  NECK: normal movements of the head and neck  LUNGS: on inspection no signs of respiratory distress, breathing rate appears normal, no obvious gross SOB, gasping or wheezing  CV: no obvious cyanosis  Skin: reviewed picture sent in by pt.   PSYCH/NEURO: pleasant and cooperative, no obvious depression or anxiety, speech and thought processing grossly intact  ASSESSMENT AND PLAN:  Discussed the following assessment and plan:  Problem List Items Addressed This Visit     Rash - Primary   Small circular rash- left posterior calf. Improved with lamisil. Will continue lamisil. Follow. Call or message with update.       Psoriatic arthritis (HCC)   Followed by dermatology. Continue otezla .       History of lumbar surgery   Recent surgery as outlined - 12/24/23. Continue f/u with [facility].        No follow-ups on file.   I discussed the assessment and treatment plan with the patient. The patient was provided an opportunity to ask questions and all were answered. The patient agreed with the plan and demonstrated an understanding of the instructions.   The patient was advised to call back or seek an in-person evaluation if the symptoms worsen or if the condition fails to improve as anticipated.    Allena Hamilton, MD

## 2024-01-05 ENCOUNTER — Encounter: Payer: Self-pay | Admitting: Internal Medicine

## 2024-01-05 DIAGNOSIS — Z9889 Other specified postprocedural states: Secondary | ICD-10-CM | POA: Insufficient documentation

## 2024-01-05 NOTE — Progress Notes (Unsigned)
   REFERRING PHYSICIAN:  Glendia Shad, Md [address] [place],  [place] [address]  DOS: 12/22/23  PSF/XLIF L4-L5  HISTORY OF PRESENT ILLNESS: April Santos is approximately 2 weeks status post above surgery. Was given celebrex , robaxin , and oxycodone  on discharge from the hospital.   She has a lot of pain around her incision. Her preop back and right leg pain is better!  She is taking oxycodone , robaxin , and celebrex . She did not restart neurontin  when she came home from the hospital.    PHYSICAL EXAMINATION:  General: Patient is well developed, well nourished, calm, collected, and in no apparent distress.   NEUROLOGICAL:  General: In no acute distress.   Awake, alert, oriented to person, place, and time.  Pupils equal round and reactive to light.  Facial tone is symmetric.     Strength:      Side Iliopsoas Quads Hamstring PF DF EHL  R 5 5 5 5 5 5   L 5 5 5 5 5 5    Incisions c/d/i   ROS (Neurologic):  Negative except as noted above  IMAGING: Nothing new to review.   ASSESSMENT/PLAN:  April Santos is doing well s/p above surgery. Treatment options reviewed with patient and following plan made:   - I have advised the patient to lift up to 10 pounds until 6 weeks after surgery (follow up with Dr. Clois).  - Reviewed wound care.  - No bending, twisting, or lifting.  - Continue on current medications including prn oxycodone , celebrex , robaxin , and prn senna.  - Refills on oxycodone . PMP reviewed and is appropriate. Directions changed to q 6 hours prn pain.  - She stopped neurotnin when she came home from hospital and is doing okay. She will not restart for now.  - Follow up as scheduled in 4 weeks and prn.   Advised to contact the office if any questions or concerns arise.  Glade Boys PA-C Department of neurosurgery

## 2024-01-05 NOTE — Assessment & Plan Note (Signed)
 Small circular rash- left posterior calf. Improved with lamisil. Will continue lamisil. Follow. Call or message with update.

## 2024-01-05 NOTE — Assessment & Plan Note (Signed)
 Followed by dermatology. Continue otezla .

## 2024-01-05 NOTE — Assessment & Plan Note (Signed)
 Recent surgery as outlined - 12/24/23. Continue f/u with NSU.

## 2024-01-06 ENCOUNTER — Encounter: Payer: Self-pay | Admitting: Orthopedic Surgery

## 2024-01-06 ENCOUNTER — Ambulatory Visit (INDEPENDENT_AMBULATORY_CARE_PROVIDER_SITE_OTHER): Admitting: Orthopedic Surgery

## 2024-01-06 VITALS — BP 128/82 | Temp 98.4°F | Ht 61.0 in | Wt 133.0 lb

## 2024-01-06 DIAGNOSIS — Z981 Arthrodesis status: Secondary | ICD-10-CM

## 2024-01-06 DIAGNOSIS — M4316 Spondylolisthesis, lumbar region: Secondary | ICD-10-CM

## 2024-01-06 MED ORDER — SENNA 8.6 MG PO TABS: 1.0000 | ORAL_TABLET | Freq: Two times a day (BID) | ORAL | 0 refills | Status: AC | PRN

## 2024-01-06 MED ORDER — OXYCODONE HCL 5 MG PO TABS: 5.0000 mg | ORAL_TABLET | Freq: Four times a day (QID) | ORAL | 0 refills | Status: DC | PRN

## 2024-01-06 MED ORDER — CELECOXIB 200 MG PO CAPS: 200.0000 mg | ORAL_CAPSULE | Freq: Two times a day (BID) | ORAL | 0 refills | Status: DC

## 2024-01-08 ENCOUNTER — Encounter: Payer: Self-pay | Admitting: Nurse Practitioner

## 2024-01-08 ENCOUNTER — Other Ambulatory Visit (HOSPITAL_COMMUNITY): Payer: Self-pay

## 2024-01-08 ENCOUNTER — Ambulatory Visit: Attending: Nurse Practitioner | Admitting: Nurse Practitioner

## 2024-01-08 VITALS — BP 120/67 | HR 65 | Temp 97.5°F | Resp 18 | Ht 61.0 in | Wt 125.0 lb

## 2024-01-08 DIAGNOSIS — G894 Chronic pain syndrome: Secondary | ICD-10-CM | POA: Insufficient documentation

## 2024-01-08 DIAGNOSIS — G8929 Other chronic pain: Secondary | ICD-10-CM | POA: Diagnosis not present

## 2024-01-08 DIAGNOSIS — M47816 Spondylosis without myelopathy or radiculopathy, lumbar region: Secondary | ICD-10-CM | POA: Insufficient documentation

## 2024-01-08 DIAGNOSIS — M48061 Spinal stenosis, lumbar region without neurogenic claudication: Secondary | ICD-10-CM | POA: Diagnosis not present

## 2024-01-08 DIAGNOSIS — M5416 Radiculopathy, lumbar region: Secondary | ICD-10-CM | POA: Insufficient documentation

## 2024-01-08 NOTE — Progress Notes (Signed)
 Nursing Pain Medication Assessment:  Safety precautions to be maintained throughout the outpatient stay will include: orient to surroundings, keep bed in low position, maintain call bell within reach at all times, provide assistance with transfer out of bed and ambulation.  Medication Inspection Compliance: Pill count conducted under aseptic conditions, in front of the patient. Neither the pills nor the bottle was removed from the patient's sight at any time. Once count was completed pills were immediately returned to the patient in their original bottle.  Medication: Hydrocodone /APAP Pill/Patch Count: 10 of 30 pills/patches remain Pill/Patch Appearance: Markings consistent with prescribed medication Bottle Appearance: Standard pharmacy container. Clearly labeled. Filled Date: 09 / 02 / 2025 Last Medication intake:  12/21/2023

## 2024-01-08 NOTE — Progress Notes (Signed)
 PROVIDER NOTE: Interpretation of information contained herein should be left to medically-trained personnel. Specific patient instructions are provided elsewhere under Patient Instructions section of medical record. This document was created in part using AI and STT-dictation technology, any transcriptional errors that may result from this process are unintentional.  Patient: April Santos  Service: E/M   PCP: Glendia Shad, MD  DOB: [birth year]  DOS: 01/08/2024  Provider: Emmy MARLA Blanch, NP  MRN: [id number]  Delivery: Face-to-face  Specialty: Interventional Pain Management  Type: Established Patient  Setting: Ambulatory outpatient facility  Specialty designation: 09  Referring Prov.: Glendia Shad, MD  Location: Outpatient office facility       History of present illness (HPI) April Santos, a 68 y.o. year old female, is here today because of her Back Pain (Lower back to outside of right knee). April Santos's primary complain today is Back Pain (Lower back to outside of right knee)  Pertinent problems: April Santos has  Left shoulder pain; Psoriatic arthritis (HCC); Right hand pain; Osteopenia; Toenail fungus; Right knee pain; Stress; Leg pain, left; Generalized osteoarthritis of hand; HNP (herniated nucleus pulposus), lumbar; Chronic radicular lumbar pain; Osteoarthritis of knee; Sacroiliitis (HCC); Coccyx pain; and Chronic pain Syndrome on their pertinent problem list.  Pain Assessment: Severity of Chronic pain is reported as a 6 /10. Location: Back Lower/Radiates down right hip to outside of right knee. Onset: More than a month ago. Quality: Aching. Timing: Constant. Modifying factor(s): Medication, Rest. Vitals:  height is 5' 1 (1.549 m) and weight is 125 lb (56.7 kg). Her temporal temperature is 97.5 F (36.4 C) (abnormal). Her blood pressure is 120/67 and her pulse is 65. Her respiration is 18 and oxygen saturation is 100%.  BMI: Estimated body mass index is 23.62 kg/m as calculated  from the following:   Height as of this encounter: 5' 1 (1.549 m).   Weight as of this encounter: 125 lb (56.7 kg).  Last encounter: 12/09/2023. Last procedure: Visit date not found.  Reason for encounter: follow-up evaluation.  The patient presents status post L4-L5 XLIF and PSF surgical procedure performed by Dr. Katrina.  She continues to experience back pain, however reports overall improvement since the lumbar fusion.  Postoperatively, she was prescribed oxycodone  5 mg by Stacy Luna, PA-C.  She is currently not taking hydrocodone  previously prescribed by pain management.  I advised her to continue oxycodone  as needed at this time for postoperative pain control. Pharmacotherapy Assessment   Monitoring: [place] PMP: PDMP reviewed during this encounter.       Pharmacotherapy: No side-effects or adverse reactions reported. Compliance: No problems identified. Effectiveness: Clinically acceptable.  April Santos, April Santos  01/08/2024 11:24 AM  Sign when Signing Visit Nursing Pain Medication Assessment:  Safety precautions to be maintained throughout the outpatient stay will include: orient to surroundings, keep bed in low position, maintain call bell within reach at all times, provide assistance with transfer out of bed and ambulation.  Medication Inspection Compliance: Pill count conducted under aseptic conditions, in front of the patient. Neither the pills nor the bottle was removed from the patient's sight at any time. Once count was completed pills were immediately returned to the patient in their original bottle.  Medication: Hydrocodone /APAP Pill/Patch Count: 10 of 30 pills/patches remain Pill/Patch Appearance: Markings consistent with prescribed medication Bottle Appearance: Standard pharmacy container. Clearly labeled. Filled Date: 09 / 02 / 2025 Last Medication intake:  12/21/2023    UDS:  Summary  Date Value Ref Range Status  10/02/2023 FINAL  Final    Comment:     ==================================================================== Compliance Drug Analysis, Ur ==================================================================== Test                             Result       Flag       Units  Drug Present and Declared for Prescription Verification   Gabapentin                      PRESENT      EXPECTED  Drug Present not Declared for Prescription Verification   Oxymorphone                    311          UNEXPECTED ng/mg creat    Sources of oxymorphone include scheduled prescription medications;    it is also an expected metabolite of oxycodone .  Drug Absent but Declared for Prescription Verification   Alprazolam                      Not Detected UNEXPECTED ng/mg creat   Codeine                         Not Detected UNEXPECTED ng/mg creat   Hydrocodone                     Not Detected UNEXPECTED ng/mg creat   Acetaminophen                   Not Detected UNEXPECTED    Acetaminophen , as indicated in the declared medication list, is not    always detected even when used as directed.    Salicylate                     Not Detected UNEXPECTED    Aspirin, as indicated in the declared medication list, is not always    detected even when used as directed.    Ibuprofen                      Not Detected UNEXPECTED    Ibuprofen, as indicated in the declared medication list, is not    always detected even when used as directed.    Guaifenesin                     Not Detected UNEXPECTED ==================================================================== Test                      Result    Flag   Units      Ref Range   Creatinine              75               mg/dL      >=79 ==================================================================== Declared Medications:  The flagging and interpretation on this report are based on the  following declared medications.  Unexpected results may arise from  inaccuracies in the declared medications.   **Note: The testing  scope of this panel includes these medications:   Alprazolam  (Xanax )  Codeine   Gabapentin   Guaifenesin   Hydrocodone  (Norco)   **Note: The testing scope of this panel does not include small to  moderate amounts of these reported medications:   Acetaminophen  (Norco)  Aspirin  Ibuprofen (Advil)   **Note: The testing scope  of this panel does not include the  following reported medications:   Apremilast  (Otezla )  Crisaborole  (Eucrisa )  Desonide  (Desowen )  Estradiol  (Estrace )  Rabeprazole  (Aciphex )  Rosuvastatin  (Crestor )  Sulfadiazine  (Silvadene )  Supplement  Triamcinolone  (Kenalog )  Vitamin B  Vitamin B12  Vitamin C  Vitamin D3 ==================================================================== For clinical consultation, please call [phone]. ====================================================================     No results found for: CBDTHCR No results found for: D8THCCBX No results found for: D9THCCBX  ROS  Constitutional: Denies any fever or chills Gastrointestinal: No reported hemesis, hematochezia, vomiting, or acute GI distress Musculoskeletal: Low back pain Neurological: No reported episodes of acute onset apraxia, aphasia, dysarthria, agnosia, amnesia, paralysis, loss of coordination, or loss of consciousness  Medication Review  ALPRAZolam , Apremilast , B-complex with vitamin C, Benefiber, Biotin-Vitamin C, Crisaborole , RABEprazole , Vitamin B-12, acetaminophen , amoxicillin, aspirin EC, celecoxib , cholecalciferol, desonide , diphenhydrAMINE , estradiol , gabapentin , hydrocortisone  cream, methocarbamol , multivitamin with minerals, mupirocin  ointment, oxyCODONE , polyethylene glycol, rosuvastatin , senna, and triamcinolone  cream  History Review  Allergy: April Santos is allergic to cefdinir and erythromycin ethylsuccinate [erythromycin]. Drug: April Santos  reports no history of drug use. Alcohol:  reports that she does not currently use alcohol. Tobacco:   reports that she has never smoked. She has never used smokeless tobacco. Social: April Santos  reports that she has never smoked. She has never used smokeless tobacco. She reports that she does not currently use alcohol. She reports that she does not use drugs. Medical:  has a past medical history of Anxiety, Aortic atherosclerosis, CAD (coronary artery disease), Cholelithiasis, Colon polyps, COVID-19 (2022), DDD (degenerative disc disease), lumbar, Dysphagia, Family history of adverse reaction to anesthesia, GERD (gastroesophageal reflux disease), History of uterine fibroid, Hyperlipidemia, Long-term current use of immunomodulator, Long-term use of aspirin therapy, Lumbar stenosis, Lung nodule, Osteoarthritis, Psoriatic arthritis (HCC), Varicose vein of leg, Venous insufficiency, and Vitamin D  deficiency. Surgical: April Santos  has a past surgical history that includes Uterine fibroid surgery (1997); Breast surgery (1999); Foot surgery (02/07/1003); carpal (2005); Sacroiliac joint injection (08/17/13 & 04/13/14); Colonoscopy with esophagogastroduodenoscopy (egd); Abdominal hysterectomy; Sigmoidoscopy; Colonoscopy with propofol  (N/A, 06/10/2016); Esophagogastroduodenoscopy (egd) with propofol  (N/A, 06/10/2016); Carpometacarpal (cmc) fusion of thumb (Right, 11/16/2020); Reverse shoulder arthroplasty (Left, 03/04/2023); Varicose vein surgery; Anterior lateral lumbar fusion with percutaneous screw 1 level (N/A, 12/22/2023); and Application of intraoperative CT scan (N/A, 12/22/2023). Family: family history includes Arthritis in her father; Breast cancer in her mother; Coronary artery disease in her father; Diabetes in her brother and maternal uncle; GER disease in her father; Heart attack in her father.  Laboratory Chemistry Profile   Renal Lab Results  Component Value Date   BUN 13 12/03/2023   CREATININE 0.68 12/03/2023   GFR 88.82 12/03/2023   GFRNONAA >60 02/25/2023    Hepatic Lab Results  Component  Value Date   AST 16 09/12/2023   ALT 11 09/12/2023   ALBUMIN 4.1 09/12/2023   ALKPHOS 81 09/12/2023    Electrolytes Lab Results  Component Value Date   NA 140 12/03/2023   K 4.6 12/03/2023   CL 101 12/03/2023   CALCIUM  9.4 12/03/2023    Bone Lab Results  Component Value Date   VD25OH 50.93 05/01/2023    Inflammation (CRP: Acute Phase) (ESR: Chronic Phase) Lab Results  Component Value Date   CRP 0.2 (L) 06/03/2016   ESRSEDRATE 3 06/03/2016         Note: Above Lab results reviewed.  Recent Imaging Review  DG Lumbar Spine 2-3 Views CLINICAL DATA:  Elective surgery.  EXAM:  LUMBAR SPINE - 2-3 VIEW  COMPARISON:  Preoperative imaging  FINDINGS: Eight fluoroscopic spot views as well as rete C-arm images submitted from the operating room. Posterior rod and pedicle screw fixation with interbody spacer at L4-L5. Fluoroscopy time 1 minutes 53 seconds. Dose 114.32 mGy.  IMPRESSION: Intraoperative fluoroscopy during lumbar fusion.  Electronically Signed   By: Andrea Gasman M.D.   On: 12/22/2023 19:15 DG C-Arm 1-60 Min-No Report Fluoroscopy was utilized by the requesting physician.  No radiographic  interpretation.  DG C-Arm 1-60 Min-No Report Fluoroscopy was utilized by the requesting physician.  No radiographic  interpretation.  Note: Reviewed        Physical Exam  Vitals: BP 120/67 (BP Location: Right Arm, Patient Position: Sitting, Cuff Size: Normal)   Pulse 65   Temp (!) 97.5 F (36.4 C) (Temporal)   Resp 18   Ht 5' 1 (1.549 m)   Wt 125 lb (56.7 kg)   LMP 02/26/2002   SpO2 100%   BMI 23.62 kg/m  BMI: Estimated body mass index is 23.62 kg/m as calculated from the following:   Height as of this encounter: 5' 1 (1.549 m).   Weight as of this encounter: 125 lb (56.7 kg). Ideal: Ideal body weight: 47.8 kg (105 lb 6.1 oz) Adjusted ideal body weight: 51.4 kg (113 lb 3.7 oz) General appearance: Well nourished, well developed, and well hydrated. In no  apparent acute distress Mental status: Alert, oriented x 3 (person, place, & time)       Respiratory: No evidence of acute respiratory distress Eyes: PERLA   Assessment   Diagnosis Status  1. Chronic radicular lumbar pain   2. Lumbar facet arthropathy   3. Lumbar radiculopathy   4. Neuroforaminal stenosis of lumbar spine   5. Chronic pain syndrome    Controlled Controlled Controlled   Updated Problems: No problems updated.  Plan of Care  Problem-specific:  Assessment and Plan  The patient presents status post L4-L5 XLIF and PSF surgical procedure performed by Dr. Katrina.  She continues to experience back pain, however reports overall improvement since the lumbar fusion.  April Santos has a current medication list which includes the following long-term medication(s): diphenhydramine , rabeprazole , and rosuvastatin .  Pharmacotherapy (Medications Ordered): No orders of the defined types were placed in this encounter.  Orders:  No orders of the defined types were placed in this encounter.       Return in about 1 month (around 02/08/2024) for (VV), (MM), Emmy Blanch NP.    Recent Visits Date Type Provider Dept  12/09/23 Office Visit Adalae Baysinger K, NP [facility]  Showing recent visits within past 90 days and meeting all other requirements Today's Visits Date Type Provider Dept  01/08/24 Office Visit Tasfia Vasseur K, NP [facility]  Showing today's visits and meeting all other requirements Future Appointments No visits were found meeting these conditions. Showing future appointments within next 90 days and meeting all other requirements  I discussed the assessment and treatment plan with the patient. The patient was provided an opportunity to ask questions and all were answered. The patient agreed with the plan and demonstrated an understanding of the instructions.  Patient advised to call back or seek an in-person evaluation if the  symptoms or condition worsens.  I personally spent a total of 15 minutes in the care of the patient today including preparing to see the patient, getting/reviewing separately obtained history, performing a medically appropriate exam/evaluation, counseling and educating, referring  and communicating with other health care professionals, documenting clinical information in the EHR, independently interpreting results, communicating results, and coordinating care.   Note by: Emmy MARLA Blanch, NP  Date: 01/08/2024; Time: 2:30 PM

## 2024-01-15 ENCOUNTER — Encounter: Payer: Self-pay | Admitting: Internal Medicine

## 2024-01-15 NOTE — Telephone Encounter (Signed)
 Has she tried melatonin. If no, then can try taking 2 hours before bed and see if this will help to regulate her natural sleep cycle.

## 2024-01-20 ENCOUNTER — Other Ambulatory Visit (INDEPENDENT_AMBULATORY_CARE_PROVIDER_SITE_OTHER)

## 2024-01-20 DIAGNOSIS — R739 Hyperglycemia, unspecified: Secondary | ICD-10-CM

## 2024-01-20 DIAGNOSIS — Z13220 Encounter for screening for lipoid disorders: Secondary | ICD-10-CM | POA: Diagnosis not present

## 2024-01-20 DIAGNOSIS — I2510 Atherosclerotic heart disease of native coronary artery without angina pectoris: Secondary | ICD-10-CM

## 2024-01-20 DIAGNOSIS — L4050 Arthropathic psoriasis, unspecified: Secondary | ICD-10-CM

## 2024-01-20 LAB — CBC WITH DIFFERENTIAL/PLATELET
Basophils Absolute: 0 K/uL (ref 0.0–0.1)
Basophils Relative: 0.5 % (ref 0.0–3.0)
Eosinophils Absolute: 0.1 K/uL (ref 0.0–0.7)
Eosinophils Relative: 2.4 % (ref 0.0–5.0)
HCT: 36.8 % (ref 36.0–46.0)
Hemoglobin: 12.2 g/dL (ref 12.0–15.0)
Lymphocytes Relative: 39.8 % (ref 12.0–46.0)
Lymphs Abs: 1.3 K/uL (ref 0.7–4.0)
MCHC: 33.2 g/dL (ref 30.0–36.0)
MCV: 85.7 fl (ref 78.0–100.0)
Monocytes Absolute: 0.3 K/uL (ref 0.1–1.0)
Monocytes Relative: 10.2 % (ref 3.0–12.0)
Neutro Abs: 1.6 K/uL (ref 1.4–7.7)
Neutrophils Relative %: 47.1 % (ref 43.0–77.0)
Platelets: 262 K/uL (ref 150.0–400.0)
RBC: 4.29 Mil/uL (ref 3.87–5.11)
RDW: 13.9 % (ref 11.5–15.5)
WBC: 3.4 K/uL — ABNORMAL LOW (ref 4.0–10.5)

## 2024-01-20 LAB — BASIC METABOLIC PANEL WITH GFR
BUN: 11 mg/dL (ref 6–23)
CO2: 28 meq/L (ref 19–32)
Calcium: 9.4 mg/dL (ref 8.4–10.5)
Chloride: 105 meq/L (ref 96–112)
Creatinine, Ser: 0.73 mg/dL (ref 0.40–1.20)
GFR: 83.79 mL/min (ref >60.00)
Glucose, Bld: 98 mg/dL (ref 70–99)
Potassium: 4.1 meq/L (ref 3.5–5.1)
Sodium: 141 meq/L (ref 135–145)

## 2024-01-20 LAB — HEPATIC FUNCTION PANEL
ALT: 8 U/L (ref 0–35)
AST: 14 U/L (ref 0–37)
Albumin: 4.3 g/dL (ref 3.5–5.2)
Alkaline Phosphatase: 109 U/L (ref 39–117)
Bilirubin, Direct: 0.2 mg/dL (ref 0.0–0.3)
Total Bilirubin: 0.6 mg/dL (ref 0.2–1.2)
Total Protein: 7.1 g/dL (ref 6.0–8.3)

## 2024-01-20 LAB — HEMOGLOBIN A1C: Hgb A1c MFr Bld: 5.7 % (ref 4.6–6.5)

## 2024-01-20 LAB — LIPID PANEL
Cholesterol: 133 mg/dL (ref 0–200)
HDL: 60.8 mg/dL (ref >39.00)
LDL Cholesterol: 59 mg/dL (ref 0–99)
NonHDL: 72.41
Total CHOL/HDL Ratio: 2
Triglycerides: 68 mg/dL (ref 0.0–149.0)
VLDL: 13.6 mg/dL (ref 0.0–40.0)

## 2024-01-22 ENCOUNTER — Telehealth: Payer: Self-pay

## 2024-01-22 ENCOUNTER — Ambulatory Visit: Admitting: Internal Medicine

## 2024-01-22 VITALS — BP 108/60 | HR 65 | Temp 97.9°F | Ht 60.5 in | Wt 128.0 lb

## 2024-01-22 DIAGNOSIS — L4050 Arthropathic psoriasis, unspecified: Secondary | ICD-10-CM

## 2024-01-22 DIAGNOSIS — R739 Hyperglycemia, unspecified: Secondary | ICD-10-CM | POA: Diagnosis not present

## 2024-01-22 DIAGNOSIS — K219 Gastro-esophageal reflux disease without esophagitis: Secondary | ICD-10-CM | POA: Diagnosis not present

## 2024-01-22 DIAGNOSIS — Z0000 Encounter for general adult medical examination without abnormal findings: Secondary | ICD-10-CM

## 2024-01-22 DIAGNOSIS — Z9889 Other specified postprocedural states: Secondary | ICD-10-CM

## 2024-01-22 DIAGNOSIS — Z23 Encounter for immunization: Secondary | ICD-10-CM | POA: Diagnosis not present

## 2024-01-22 DIAGNOSIS — R911 Solitary pulmonary nodule: Secondary | ICD-10-CM

## 2024-01-22 DIAGNOSIS — E785 Hyperlipidemia, unspecified: Secondary | ICD-10-CM

## 2024-01-22 DIAGNOSIS — R1310 Dysphagia, unspecified: Secondary | ICD-10-CM

## 2024-01-22 DIAGNOSIS — Z13220 Encounter for screening for lipoid disorders: Secondary | ICD-10-CM | POA: Diagnosis not present

## 2024-01-22 DIAGNOSIS — I2510 Atherosclerotic heart disease of native coronary artery without angina pectoris: Secondary | ICD-10-CM | POA: Diagnosis not present

## 2024-01-22 DIAGNOSIS — R87622 Low grade squamous intraepithelial lesion on cytologic smear of vagina (LGSIL): Secondary | ICD-10-CM

## 2024-01-22 DIAGNOSIS — Z9109 Other allergy status, other than to drugs and biological substances: Secondary | ICD-10-CM | POA: Diagnosis not present

## 2024-01-22 DIAGNOSIS — G8929 Other chronic pain: Secondary | ICD-10-CM

## 2024-01-22 MED ORDER — ROSUVASTATIN CALCIUM 5 MG PO TABS: 5.0000 mg | ORAL_TABLET | Freq: Every day | ORAL | 1 refills | Status: DC

## 2024-01-22 NOTE — Progress Notes (Signed)
 Subjective:    Patient ID: April Santos, female    DOB: [birth year], 68 y.o.   MRN: [id number]  Patient here for  Chief Complaint  Patient presents with   Medical Management of Chronic Issues    CPE    HPI Here for a physical exam. Is s/p L4-L5 >LIF and PSF surgical procedure performed by Dr Clois (12/22/23). Pain has improved. Some aching - end of day.  Off narcotic pain medication. S/p shoulder replacement in 02/2023. Saw GI 10/02/23 - dysphagia. Recommended EGD. GERD controlled on aciphex  20mg  bid. F/u constipation as well. Recommended mirilax. UTD on colonoscopy. Planning EGD in 03/2024. Decreased appetite. Discussed - protein shakes. Bowels are moving. Sees gyn   Past Medical History:  Diagnosis Date   Anxiety    a.) on BZO PRN (alprazolam )   Aortic atherosclerosis    CAD (coronary artery disease)    Cholelithiasis    Colon polyps    COVID-19 2022   DDD (degenerative disc disease), lumbar    Dysphagia    Family history of adverse reaction to anesthesia    a.) PONV in 1st degree relative (mother)   GERD (gastroesophageal reflux disease)    History of uterine fibroid    Hyperlipidemia    Long-term current use of immunomodulator    a.) on DMARD therapy (apremilast ) for psoriatic arthritis   Long-term use of aspirin therapy    Lumbar stenosis    Lung nodule    Osteoarthritis    Psoriatic arthritis (HCC)    a.) Tx'd with DMARD therapy (apremilast )   Varicose vein of leg    Venous insufficiency    Vitamin D  deficiency    Past Surgical History:  Procedure Laterality Date   ABDOMINAL HYSTERECTOMY     ANTERIOR LATERAL LUMBAR FUSION WITH PERCUTANEOUS SCREW 1 LEVEL N/A 12/22/2023   Procedure: ANTERIOR LATERAL LUMBAR FUSION WITH PERCUTANEOUS SCREW 1 LEVEL;  Surgeon: Clois Fret, MD;  Location: [facility] OR[facility];  Service: Neurosurgery;  Laterality: N/A;  L4-5 LATERAL LUMBAR INTERBODY FUSION AND POSTERIOR SPINAL FUSION   APPLICATION OF INTRAOPERATIVE CT SCAN N/A 12/22/2023    Procedure: APPLICATION OF INTRAOPERATIVE CT SCAN;  Surgeon: Clois Fret, MD;  Location: [facility];  Service: Neurosurgery;  Laterality: N/A;   BREAST SURGERY  1999   biopsy   carpal  2005   Tunnel right hand   CARPOMETACARPAL (CMC) FUSION OF THUMB Right 11/16/2020   Procedure: Right CARPOMETACARPAL [facility]) ARTHROPLASTY;  Surgeon: Kathlynn Sharper, MD;  Location: [facility] OR[facility];  Service: Orthopedics;  Laterality: Right;   COLONOSCOPY WITH ESOPHAGOGASTRODUODENOSCOPY (EGD)     COLONOSCOPY WITH PROPOFOL  N/A 06/10/2016   Procedure: COLONOSCOPY WITH PROPOFOL ;  Surgeon: Lamar ONEIDA Holmes, MD;  Location: [facility] ENDOSCOPY;  Service: Endoscopy;  Laterality: N/A;   ESOPHAGOGASTRODUODENOSCOPY (EGD) WITH PROPOFOL  N/A 06/10/2016   Procedure: ESOPHAGOGASTRODUODENOSCOPY (EGD) WITH PROPOFOL ;  Surgeon: Lamar ONEIDA Holmes, MD;  Location: [facility] ENDOSCOPY;  Service: Endoscopy;  Laterality: N/A;   FOOT SURGERY  02/07/1003   right foot benign tumor   REVERSE SHOULDER ARTHROPLASTY Left 03/04/2023   Procedure: REVERSE SHOULDER ARTHROPLASTY;  Surgeon: Edie Norleen PARAS, MD;  Location: [facility] ORS;  Service: Orthopedics;  Laterality: Left;   SACROILIAC JOINT INJECTION  08/17/13 & 04/13/14   Dr. Avanell   SIGMOIDOSCOPY     UTERINE FIBROID SURGERY  1997   VARICOSE VEIN SURGERY     Family History  Problem Relation Age of Onset   Breast cancer Mother        101/74  Heart attack Father    Arthritis Father    GER disease Father    Coronary artery disease Father        11   Diabetes Brother    Diabetes Maternal Uncle    Colon cancer Neg Hx    Social History   Socioeconomic History   Marital status: Significant Other    Spouse name: Vinie Molt   Number of children: 0   Years of education: Not on file   Highest education level: Not on file  Occupational History   Not on file  Tobacco Use   Smoking status: Never   Smokeless tobacco: Never  Vaping Use   Vaping status: Never Used  Substance and Sexual Activity    Alcohol use: Not Currently    Comment: rare   Drug use: No   Sexual activity: Yes    Birth control/protection: Surgical    Comment: Hysterectomy  Other Topics Concern   Not on file  Social History Narrative   Vinie Molt - POA   Social Drivers of Health   Financial Resource Strain: Low Risk  (01/27/2024)   Overall Financial Resource Strain (CARDIA)    Difficulty of Paying Living Expenses: Not very hard  Food Insecurity: No Food Insecurity (01/27/2024)   Hunger Vital Sign    Worried About Running Out of Food in the Last Year: Never true    Ran Out of Food in the Last Year: Never true  Transportation Needs: No Transportation Needs (01/27/2024)   PRAPARE - Administrator, Civil Service (Medical): No    Lack of Transportation (Non-Medical): No  Physical Activity: Sufficiently Active (01/27/2024)   Exercise Vital Sign    Days of Exercise per Week: 5 days    Minutes of Exercise per Session: 60 min  Stress: No Stress Concern Present (01/27/2024)   [employer] of Occupational Health - Occupational Stress Questionnaire    Feeling of Stress: Not at all  Social Connections: Moderately Integrated (01/27/2024)   Social Connection and Isolation Panel    Frequency of Communication with Friends and Family: Twice a week    Frequency of Social Gatherings with Friends and Family: Twice a week    Attends Religious Services: More than 4 times per year    Active Member of [employer] or Organizations: No    Attends Banker Meetings: Never    Marital Status: Living with partner     Review of Systems  Constitutional:  Negative for unexpected weight change.       Some decreased appetite.   HENT:  Negative for congestion, sinus pressure and sore throat.   Eyes:  Negative for pain and visual disturbance.  Respiratory:  Negative for cough, chest tightness and shortness of breath.   Cardiovascular:  Negative for chest pain, palpitations and leg swelling.   Gastrointestinal:  Negative for abdominal pain, diarrhea and vomiting.  Genitourinary:  Negative for difficulty urinating and dysuria.  Musculoskeletal:  Positive for back pain. Negative for joint swelling and myalgias.  Skin:  Negative for color change and rash.  Neurological:  Negative for dizziness and headaches.  Hematological:  Negative for adenopathy. Does not bruise/bleed easily.  Psychiatric/Behavioral:  Negative for agitation and dysphoric mood.        Objective:     BP 108/60 (Cuff Size: Normal)   Pulse 65   Temp 97.9 F (36.6 C) (Oral)   Ht 5' 0.5 (1.537 m)   Wt 128 lb (58.1 kg)   LMP  02/26/2002   SpO2 98%   BMI 24.59 kg/m  Wt Readings from Last 3 Encounters:  01/27/24 125 lb (56.7 kg)  01/22/24 128 lb (58.1 kg)  01/08/24 125 lb (56.7 kg)    Physical Exam Vitals reviewed.  Constitutional:      General: She is not in acute distress.    Appearance: Normal appearance.  HENT:     Head: Normocephalic and atraumatic.     Right Ear: External ear normal.     Left Ear: External ear normal.     Mouth/Throat:     Pharynx: No oropharyngeal exudate or posterior oropharyngeal erythema.  Eyes:     General: No scleral icterus.       Right eye: No discharge.        Left eye: No discharge.     Conjunctiva/sclera: Conjunctivae normal.  Neck:     Thyroid : No thyromegaly.  Cardiovascular:     Rate and Rhythm: Normal rate and regular rhythm.  Pulmonary:     Effort: No respiratory distress.     Breath sounds: Normal breath sounds. No wheezing.  Abdominal:     General: Bowel sounds are normal.     Palpations: Abdomen is soft.     Tenderness: There is no abdominal tenderness.  Musculoskeletal:        General: No swelling or tenderness.     Cervical back: Neck supple. No tenderness.  Lymphadenopathy:     Cervical: No cervical adenopathy.  Skin:    Findings: No erythema or rash.  Neurological:     Mental Status: She is alert.  Psychiatric:        Mood and Affect:  Mood normal.        Behavior: Behavior normal.         Outpatient Encounter Medications as of 01/22/2024  Medication Sig   acetaminophen  (TYLENOL ) 500 MG tablet Take 2 tablets (1,000 mg total) by mouth every 6 (six) hours as needed.   ALPRAZolam  (XANAX ) 0.25 MG tablet Take 1 tablet (0.25 mg total) by mouth daily as needed.   Apremilast  (OTEZLA ) 30 MG TABS Take 1 tablet (30 mg total) by mouth daily.   aspirin EC 81 MG tablet Take 81 mg by mouth daily. Swallow whole.   B Complex-C (B-COMPLEX WITH VITAMIN C) tablet Take 1 tablet by mouth daily.   Biotin-Vitamin C (HAIR SKIN NAILS GUMMIES PO) Take 2 tablets by mouth daily. Olly   celecoxib  (CELEBREX ) 200 MG capsule Take 1 capsule (200 mg total) by mouth 2 (two) times daily.   cholecalciferol (VITAMIN D3) 25 MCG (1000 UNIT) tablet Take 1,000 Units by mouth daily.   Crisaborole  (EUCRISA ) 2 % OINT Apply 1 application topically 2 (two) times daily as needed (psoriasis).   Cyanocobalamin (VITAMIN B-12) 3000 MCG SUBL Place 6,000 mcg under the tongue daily at 2 am. Gummy   desonide  (DESOWEN ) 0.05 % cream Apply 1 Application topically 2 (two) times daily as needed (psoriasis). For 1-2 wks   diphenhydrAMINE  (BENADRYL ) 25 mg capsule Take 1 capsule (25 mg total) by mouth every 6 (six) hours as needed for itching.   estradiol  (ESTRACE  VAGINAL) 0.1 MG/GM vaginal cream Insert 1 gram vaginally 2 times weekly as maintenance   hydrocortisone  cream 1 % Apply 1 Application topically 2 (two) times daily as needed for itching.   MELATONIN PO Take by mouth.   Multiple Vitamins-Minerals (MULTIVITAMIN WITH MINERALS) tablet Take 1 tablet by mouth daily. Olly   mupirocin  ointment (BACTROBAN ) 2 % Apply 1 Application  topically daily as needed (Wound).   polyethylene glycol (MIRALAX  / GLYCOLAX ) 17 g packet Take 17 g by mouth daily as needed for mild constipation.   RABEprazole  (ACIPHEX ) 20 MG tablet Take 1 tablet (20 mg total) by mouth 2 (two) times daily. (Patient  taking differently: Take 20 mg by mouth daily.)   senna (SENOKOT) 8.6 MG TABS tablet Take 1 tablet (8.6 mg total) by mouth 2 (two) times daily as needed for mild constipation.   triamcinolone  cream (KENALOG ) 0.1 % Apply 1 Application topically 2 (two) times daily as needed. For up to 2 weeks. Avoid applying to face, groin, and axilla. Use as directed. Long-term use can cause thinning of the skin.   Wheat Dextrin (BENEFIBER) CHEW Chew 2 tablets by mouth every morning. Gummy   [DISCONTINUED] amoxicillin (AMOXIL) 500 MG capsule Take 1,000 mg by mouth 2 (two) times daily. (Patient not taking: Reported on 01/27/2024)   [DISCONTINUED] rosuvastatin  (CRESTOR ) 5 MG tablet Take 1 tablet (5 mg total) by mouth daily.   gabapentin  (NEURONTIN ) 300 MG capsule Take 300 mg by mouth See admin instructions. Take 300 mg by mouth in the morning and at noon, and take 600 mg at bedtime.   rosuvastatin  (CRESTOR ) 5 MG tablet Take 1 tablet (5 mg total) by mouth daily.   [DISCONTINUED] methocarbamol  (ROBAXIN ) 500 MG tablet Take 1 tablet (500 mg total) by mouth every 6 (six) hours as needed for muscle spasms. (Patient not taking: Reported on 01/27/2024)   [DISCONTINUED] oxyCODONE  (OXY IR/ROXICODONE ) 5 MG immediate release tablet Take 1 tablet (5 mg total) by mouth every 6 (six) hours as needed for moderate pain (pain score 4-6).   No facility-administered encounter medications on file as of 01/22/2024.     Lab Results  Component Value Date   WBC 3.4 (L) 01/20/2024   HGB 12.2 01/20/2024   HCT 36.8 01/20/2024   PLT 262.0 01/20/2024   GLUCOSE 98 01/20/2024   CHOL 133 01/20/2024   TRIG 68.0 01/20/2024   HDL 60.80 01/20/2024   LDLCALC 59 01/20/2024   ALT 8 01/20/2024   AST 14 01/20/2024   NA 141 01/20/2024   K 4.1 01/20/2024   CL 105 01/20/2024   CREATININE 0.73 01/20/2024   BUN 11 01/20/2024   CO2 28 01/20/2024   TSH 1.08 05/01/2023   HGBA1C 5.7 01/20/2024    DG Lumbar Spine 2-3 Views Result Date:  12/22/2023 CLINICAL DATA:  Elective surgery. EXAM: LUMBAR SPINE - 2-3 VIEW COMPARISON:  Preoperative imaging FINDINGS: Eight fluoroscopic spot views as well as rete C-arm images submitted from the operating room. Posterior rod and pedicle screw fixation with interbody spacer at L4-L5. Fluoroscopy time 1 minutes 53 seconds. Dose 114.32 mGy. IMPRESSION: Intraoperative fluoroscopy during lumbar fusion. Electronically Signed   By: Andrea Gasman M.D.   On: 12/22/2023 19:15   DG C-Arm 1-60 Min-No Report Result Date: 12/22/2023 Fluoroscopy was utilized by the requesting physician.  No radiographic interpretation.   DG C-Arm 1-60 Min-No Report Result Date: 12/22/2023 Fluoroscopy was utilized by the requesting physician.  No radiographic interpretation.       Assessment & Plan:  Routine general medical examination at a health care facility  Health care maintenance Assessment & Plan: Physical today 01/22/24. Breast and pelvic exam through gyn.  Colposcopy 04/2020 - recommended f/u pap in 1-3 years. Mammogram 12/24/22 - Birads I.  Mammogram has been ordered. colonosocpy 06/2016.  Recommended f/u in 10 years.    Psoriatic arthritis (HCC) Assessment & Plan: Followed by  dermatology. Continue otezla .   Orders: -     Basic metabolic panel with GFR; Future -     Hepatic function panel; Future  Hyperglycemia Assessment & Plan: Low carb diet and exercise. Follow met b and A1c.   Orders: -     Hemoglobin A1c; Future  Screening cholesterol level -     Lipid panel; Future  Coronary artery disease involving native coronary artery of native heart without angina pectoris Assessment & Plan:  Recent screening - CT - calcium  score revealed calcium  score 516 (93 percentile). (LAD 203 and RCA 313).  Saw cardiology 11/27/23 - recommended to continue rosuvastatin .  Continue risk factor modification.   Orders: -     CBC with Differential/Platelet; Future  Flu vaccine need -     Flu vaccine HIGH DOSE  PF(Fluzone Trivalent)  Chronic radicular lumbar pain Assessment & Plan: Is s/p L4-L5 >LIF and PSF surgical procedure performed by Dr Clois (12/22/23). Pain has improved. Being followed by pain clinic.    Dysphagia, unspecified type Assessment & Plan: Seeing GI. On aciphex . Planning EGD 03/2024.    Environmental allergies Assessment & Plan: Has been followed by Dr Frutoso.    Gastroesophageal reflux disease without esophagitis Assessment & Plan: No upper symptoms reported. Continue aciphex .    History of lumbar surgery Assessment & Plan: Recent surgery as outlined - 12/24/23. Continue f/u with NSU.    Hyperlipidemia LDL goal <70 Assessment & Plan: With elevated calcium  score, continue crestor .    LGSIL Pap smear of vagina Assessment & Plan: Saw gyn 04/23/22 - PAP - ok.  Recommended repeat in 2025. Request records.    Lung nodule Assessment & Plan: Reviewed cardiac CT with pulmonary - The nodule is showing multiple characteristics that suggest an perifissural lymph node. It's quite small, but unfortunately the images stop half way through the nodule (cannot completely visualize it). If she is low risk I would feel comfortable not repeating a CT. I do have a low threshold to getting a dedicated chest CT at the one year mark for any patient (especially elderly) with any type of risk (personal history of smoking, significant second hand smoke exposure, occupational exposures, significant time spent commuting, [employer] and company work, holiday representative work, family history, etc...). It would allow for a one year surveillance of the nodule and better visualization of the whole lung. Recommend f/u one year after cardiac scan.    Other orders -     Rosuvastatin  Calcium ; Take 1 tablet (5 mg total) by mouth daily.  Dispense: 90 tablet; Refill: 1     Allena Hamilton, MD

## 2024-01-22 NOTE — Assessment & Plan Note (Addendum)
 Physical today 01/22/24. Breast and pelvic exam through gyn.  Colposcopy 04/2020 - recommended f/u pap in 1-3 years. Mammogram 12/24/22 - Birads I.  Mammogram has been ordered. colonosocpy 06/2016.  Recommended f/u in 10 years.

## 2024-01-22 NOTE — Telephone Encounter (Signed)
 1 sample pack of Otezla  provided to patient. LOT [id number] EXP 06/05/25  Patient is going to complete [employer] patient assistance paperwork and bring to office next week. aw

## 2024-01-26 ENCOUNTER — Ambulatory Visit: Admitting: Dermatology

## 2024-01-27 ENCOUNTER — Ambulatory Visit: Payer: Medicare PPO

## 2024-01-27 VITALS — Ht 60.5 in | Wt 125.0 lb

## 2024-01-27 DIAGNOSIS — Z0000 Encounter for general adult medical examination without abnormal findings: Secondary | ICD-10-CM | POA: Diagnosis not present

## 2024-01-27 NOTE — Progress Notes (Signed)
 Subjective:   April Santos is a 68 y.o. female who presents for Medicare Annual (Subsequent) preventive examination.  Visit Complete: Virtual I connected with  April Santos Husband on 01/27/24 by a audio enabled telemedicine application and verified that I am speaking with the correct person using two identifiers.  Patient Location: Home  Provider Location: Home Office  I discussed the limitations of evaluation and management by telemedicine. The patient expressed understanding and agreed to proceed.  Vital Signs: Because this visit was a virtual/telehealth visit, some criteria may be missing or patient reported. Any vitals not documented were not able to be obtained and vitals that have been documented are patient reported.  Patient Medicare AWV questionnaire was completed by the patient on 01-22-2024; I have confirmed that all information answered by patient is correct and no changes since this date.  Cardiac Risk Factors include: advanced age (>43men, >46 women)     Objective:    Today's Vitals   01/27/24 0935  Weight: 125 lb (56.7 kg)  Height: 5' 0.5 (1.537 m)  PainSc: 0-No pain   Body mass index is 24.01 kg/m.     01/27/2024    9:40 AM 01/08/2024   11:22 AM 12/22/2023    1:13 PM 12/10/2023    9:04 AM 12/09/2023    1:18 PM 10/02/2023    1:22 PM 03/04/2023    8:31 AM  Advanced Directives  Does Patient Have a Medical Advance Directive? Yes Yes Yes Yes Yes Yes Yes  Type of Estate agent of [employer] Power of [place];Living will Healthcare Power of [place];Living will  Healthcare Power of [place];Living will  Healthcare Power of [place];Out of facility DNR (pink MOST or yellow form)  Does patient want to make changes to medical advance directive? No - Patient declined  No - Patient declined  No - Patient declined  No - Guardian declined  Copy of Healthcare Power of Attorney in Chart? No - copy requested  No - copy requested    No - copy  requested    Current Medications (verified) Outpatient Encounter Medications as of 01/27/2024  Medication Sig   acetaminophen  (TYLENOL ) 500 MG tablet Take 2 tablets (1,000 mg total) by mouth every 6 (six) hours as needed.   ALPRAZolam  (XANAX ) 0.25 MG tablet Take 1 tablet (0.25 mg total) by mouth daily as needed.   Apremilast  (OTEZLA ) 30 MG TABS Take 1 tablet (30 mg total) by mouth daily.   aspirin EC 81 MG tablet Take 81 mg by mouth daily. Swallow whole.   B Complex-C (B-COMPLEX WITH VITAMIN C) tablet Take 1 tablet by mouth daily.   Biotin-Vitamin C (HAIR SKIN NAILS GUMMIES PO) Take 2 tablets by mouth daily. Olly   celecoxib  (CELEBREX ) 200 MG capsule Take 1 capsule (200 mg total) by mouth 2 (two) times daily.   cholecalciferol (VITAMIN D3) 25 MCG (1000 UNIT) tablet Take 1,000 Units by mouth daily.   Crisaborole  (EUCRISA ) 2 % OINT Apply 1 application topically 2 (two) times daily as needed (psoriasis).   Cyanocobalamin (VITAMIN B-12) 3000 MCG SUBL Place 6,000 mcg under the tongue daily at 2 am. Gummy   desonide  (DESOWEN ) 0.05 % cream Apply 1 Application topically 2 (two) times daily as needed (psoriasis). For 1-2 wks   diphenhydrAMINE  (BENADRYL ) 25 mg capsule Take 1 capsule (25 mg total) by mouth every 6 (six) hours as needed for itching.   estradiol  (ESTRACE  VAGINAL) 0.1 MG/GM vaginal cream Insert 1 gram vaginally 2 times weekly as  maintenance   gabapentin  (NEURONTIN ) 300 MG capsule Take 300 mg by mouth See admin instructions. Take 300 mg by mouth in the morning and at noon, and take 600 mg at bedtime.   hydrocortisone  cream 1 % Apply 1 Application topically 2 (two) times daily as needed for itching.   MELATONIN PO Take by mouth.   Multiple Vitamins-Minerals (MULTIVITAMIN WITH MINERALS) tablet Take 1 tablet by mouth daily. Olly   mupirocin  ointment (BACTROBAN ) 2 % Apply 1 Application topically daily as needed (Wound).   oxyCODONE  (OXY IR/ROXICODONE ) 5 MG immediate release tablet Take 1 tablet  (5 mg total) by mouth every 6 (six) hours as needed for moderate pain (pain score 4-6).   polyethylene glycol (MIRALAX  / GLYCOLAX ) 17 g packet Take 17 g by mouth daily as needed for mild constipation.   RABEprazole  (ACIPHEX ) 20 MG tablet Take 1 tablet (20 mg total) by mouth 2 (two) times daily. (Patient taking differently: Take 20 mg by mouth daily.)   rosuvastatin  (CRESTOR ) 5 MG tablet Take 1 tablet (5 mg total) by mouth daily.   senna (SENOKOT) 8.6 MG TABS tablet Take 1 tablet (8.6 mg total) by mouth 2 (two) times daily as needed for mild constipation.   triamcinolone  cream (KENALOG ) 0.1 % Apply 1 Application topically 2 (two) times daily as needed. For up to 2 weeks. Avoid applying to face, groin, and axilla. Use as directed. Long-term use can cause thinning of the skin.   Wheat Dextrin (BENEFIBER) CHEW Chew 2 tablets by mouth every morning. Gummy   amoxicillin (AMOXIL) 500 MG capsule Take 1,000 mg by mouth 2 (two) times daily. (Patient not taking: Reported on 01/27/2024)   methocarbamol  (ROBAXIN ) 500 MG tablet Take 1 tablet (500 mg total) by mouth every 6 (six) hours as needed for muscle spasms. (Patient not taking: Reported on 01/27/2024)   No facility-administered encounter medications on file as of 01/27/2024.    Allergies (verified) Cefdinir and Erythromycin ethylsuccinate [erythromycin]   History: Past Medical History:  Diagnosis Date   Anxiety    a.) on BZO PRN (alprazolam )   Aortic atherosclerosis    CAD (coronary artery disease)    Cholelithiasis    Colon polyps    COVID-19 2022   DDD (degenerative disc disease), lumbar    Dysphagia    Family history of adverse reaction to anesthesia    a.) PONV in 1st degree relative (mother)   GERD (gastroesophageal reflux disease)    History of uterine fibroid    Hyperlipidemia    Long-term current use of immunomodulator    a.) on DMARD therapy (apremilast ) for psoriatic arthritis   Long-term use of aspirin therapy    Lumbar  stenosis    Lung nodule    Osteoarthritis    Psoriatic arthritis (HCC)    a.) Tx'd with DMARD therapy (apremilast )   Varicose vein of leg    Venous insufficiency    Vitamin D  deficiency    Past Surgical History:  Procedure Laterality Date   ABDOMINAL HYSTERECTOMY     ANTERIOR LATERAL LUMBAR FUSION WITH PERCUTANEOUS SCREW 1 LEVEL N/A 12/22/2023   Procedure: ANTERIOR LATERAL LUMBAR FUSION WITH PERCUTANEOUS SCREW 1 LEVEL;  Surgeon: Clois Fret, MD;  Location: [facility] ORS;  Service: Neurosurgery;  Laterality: N/A;  L4-5 LATERAL LUMBAR INTERBODY FUSION AND POSTERIOR SPINAL FUSION   APPLICATION OF INTRAOPERATIVE CT SCAN N/A 12/22/2023   Procedure: APPLICATION OF INTRAOPERATIVE CT SCAN;  Surgeon: Clois Fret, MD;  Location: [facility] ORS;  Service: Neurosurgery;  Laterality: N/A;  BREAST SURGERY  1999   biopsy   carpal  2005   Tunnel right hand   CARPOMETACARPAL ([facility]) FUSION OF THUMB Right 11/16/2020   Procedure: Right CARPOMETACARPAL [facility]) ARTHROPLASTY;  Surgeon: Kathlynn Sharper, MD;  Location: [facility] ORS;  Service: Orthopedics;  Laterality: Right;   COLONOSCOPY WITH ESOPHAGOGASTRODUODENOSCOPY (EGD)     COLONOSCOPY WITH PROPOFOL  N/A 06/10/2016   Procedure: COLONOSCOPY WITH PROPOFOL ;  Surgeon: Lamar ONEIDA Holmes, MD;  Location[facility] ENDOSCOPY;  Service: Endoscopy;  Laterality: N/A;   ESOPHAGOGASTRODUODENOSCOPY (EGD) WITH PROPOFOL  N/A 06/10/2016   Procedure: ESOPHAGOGASTRODUODENOSCOPY (EGD) WITH PROPOFOL ;  Surgeon: Lamar ONEIDA Holmes, MD;  Location: [facility] ENDOSCOPY;  Service: Endoscopy;  Laterality: N/A;   FOOT SURGERY  02/07/1003   right foot benign tumor   REVERSE SHOULDER ARTHROPLASTY Left 03/04/2023   Procedure: REVERSE SHOULDER ARTHROPLASTY;  Surgeon: Edie Norleen PARAS, MD;  Location: [facility] ORS;  Service: Orthopedics;  Laterality: Left;   SACROILIAC JOINT INJECTION  08/17/13 & 04/13/14   Dr. Avanell   SIGMOIDOSCOPY     UTERINE FIBROID SURGERY  1997   VARICOSE VEIN SURGERY     Family History   Problem Relation Age of Onset   Breast cancer Mother        25/74   Heart attack Father    Arthritis Father    GER disease Father    Coronary artery disease Father        57   Diabetes Brother    Diabetes Maternal Uncle    Colon cancer Neg Hx    Social History   Socioeconomic History   Marital status: Significant Other    Spouse name: Vinie Molt   Number of children: 0   Years of education: Not on file   Highest education level: Not on file  Occupational History   Not on file  Tobacco Use   Smoking status: Never   Smokeless tobacco: Never  Vaping Use   Vaping status: Never Used  Substance and Sexual Activity   Alcohol use: Not Currently    Comment: rare   Drug use: No   Sexual activity: Yes    Birth control/protection: Surgical    Comment: Hysterectomy  Other Topics Concern   Not on file  Social History Narrative   Vinie Molt - POA   Social Drivers of Health   Financial Resource Strain: Low Risk  (01/27/2024)   Overall Financial Resource Strain (CARDIA)    Difficulty of Paying Living Expenses: Not very hard  Food Insecurity: No Food Insecurity (01/27/2024)   Hunger Vital Sign    Worried About Running Out of Food in the Last Year: Never true    Ran Out of Food in the Last Year: Never true  Transportation Needs: No Transportation Needs (01/27/2024)   PRAPARE - Administrator, Civil Service (Medical): No    Lack of Transportation (Non-Medical): No  Physical Activity: Sufficiently Active (01/27/2024)   Exercise Vital Sign    Days of Exercise per Week: 5 days    Minutes of Exercise per Session: 60 min  Stress: No Stress Concern Present (01/27/2024)   [employer] of Occupational Health - Occupational Stress Questionnaire    Feeling of Stress: Not at all  Social Connections: Moderately Integrated (01/27/2024)   Social Connection and Isolation Panel    Frequency of Communication with Friends and Family: Twice a week    Frequency of Social  Gatherings with Friends and Family: Twice a week    Attends Religious Services: More than 4  times per year    Active Member of Clubs or Organizations: No    Attends Banker Meetings: Never    Marital Status: Living with partner    Tobacco Counseling Counseling given: Not Answered   Clinical Intake:  Pre-visit preparation completed: Yes  Pain : No/denies pain Pain Score: 0-No pain     BMI - recorded: 23.62 Nutritional Status: BMI of 19-24  Normal Nutritional Risks: None Diabetes: No  How often do you need to have someone help you when you read instructions, pamphlets, or other written materials from your doctor or pharmacy?: 1 - Never  Interpreter Needed?: No  Information entered by :: Arnette Hoots, CMA   Activities of Daily Living    01/27/2024    9:36 AM 12/22/2023    6:29 PM  In your present state of health, do you have any difficulty performing the following activities:  Hearing? 0 0  Vision? 0 0  Difficulty concentrating or making decisions? 0 0  Walking or climbing stairs? 0   Dressing or bathing? 0   Doing errands, shopping? 0   Preparing Food and eating ? N   Using the Toilet? N   In the past six months, have you accidently leaked urine? N   Do you have problems with loss of bowel control? N   Managing your Medications? N   Managing your Finances? N   Housekeeping or managing your Housekeeping? N     Patient Care Team: Glendia Shad, MD as PCP - General (Internal Medicine) Marea Selinda RAMAN, MD as Referring Physician (Vascular Surgery)  Indicate any recent Medical Services you may have received from other than [facility] providers in the past year (date may be approximate).     Assessment:   This is a routine wellness examination for Elif.  Hearing/Vision screen Hearing Screening - Comments:: No issues Vision Screening - Comments:: No issues. [facility] Dr Francis Mallick   Goals Addressed             This Visit's Progress     Patient Stated       Get in 5000 steps, after surgrey she had to decrease from 10,000       Depression Screen    01/27/2024    9:42 AM 01/22/2024    1:05 PM 01/08/2024   11:22 AM 01/08/2024   11:21 AM 12/09/2023    1:17 PM 10/02/2023    1:23 PM 09/09/2023    8:30 AM  PHQ 2/9 Scores  PHQ - 2 Score 0 0 0 0 0 0 0  PHQ- 9 Score 4 4         Fall Risk    01/27/2024    9:41 AM 01/22/2024    1:05 PM 01/08/2024   11:21 AM 12/09/2023    1:17 PM 10/02/2023    1:22 PM  Fall Risk   Falls in the past year? 1 1 1 1 1   Number falls in past yr: 1 1 0 1 1  Injury with Fall? 0 0 0 0 0  Risk for fall due to : History of fall(s)      Follow up Education provided;Falls evaluation completed        MEDICARE RISK AT HOME: Medicare Risk at Home Any stairs in or around the home?: No If so, are there any without handrails?: No Home free of loose throw rugs in walkways, pet beds, electrical cords, etc?: No Adequate lighting in your home to reduce risk of falls?: Yes  Life alert?: No Use of a cane, walker or w/c?: No Grab bars in the bathroom?: Yes Shower chair or bench in shower?: Yes Elevated toilet seat or a handicapped toilet?: No  TIMED UP AND GO:  Was the test performed?  No    Cognitive Function:        01/27/2024    9:42 AM 01/22/2023    3:53 PM 07/05/2021   12:46 PM  6CIT Screen  What Year? 0 points 0 points 0 points  What month? 0 points 0 points 0 points  What time? 0 points 0 points 0 points  Count back from 20 0 points 0 points 0 points  Months in reverse 0 points 0 points 0 points  Repeat phrase 0 points 0 points   Total Score 0 points 0 points     Immunizations Immunization History  Administered Date(s) Administered   Fluad Quad(high Dose 65+) 01/07/2020, 12/28/2020, 01/11/2022   INFLUENZA, HIGH DOSE SEASONAL PF 01/11/2022, 01/14/2023, 01/22/2024   Influenza Split 04/28/2012   Influenza,inj,Quad PF,6+ Mos 02/02/2013, 01/12/2014, 01/04/2015, 02/05/2016, 01/06/2017,  01/19/2018, 12/18/2018   Moderna Covid-19 Vaccine Bivalent Booster 65yrs & up 01/19/2021   Moderna Sars-Covid-2 Vaccination 06/02/2019, 06/30/2019, 02/18/2020   PNEUMOCOCCAL CONJUGATE-20 08/21/2020   Zoster Recombinant(Shingrix) 12/28/2016, 05/31/2017    TDAP status: Due, Education has been provided regarding the importance of this vaccine. Advised may receive this vaccine at local pharmacy or Health Dept. Aware to provide a copy of the vaccination record if obtained from local pharmacy or Health Dept. Verbalized acceptance and understanding.  Flu Vaccine status: Up to date  Pneumococcal vaccine status: Up to date  Covid-19 vaccine status: Declined, Education has been provided regarding the importance of this vaccine but patient still declined. Advised may receive this vaccine at local pharmacy or Health Dept.or vaccine clinic. Aware to provide a copy of the vaccination record if obtained from local pharmacy or Health Dept. Verbalized acceptance and understanding.  Qualifies for Shingles Vaccine? Yes   Zostavax completed Yes   Shingrix Completed?: Yes  Screening Tests Health Maintenance  Topic Date Due   Hepatitis C Screening  Never done   DTaP/Tdap/Td (1 - Tdap) Never done   COVID-19 Vaccine (5 - 2025-26 season) 12/08/2023   Medicare Annual Wellness (AWV)  01/22/2024   Mammogram  12/24/2023   Colonoscopy  06/11/2026   Pneumococcal Vaccine: 50+ Years  Completed   Influenza Vaccine  Completed   DEXA SCAN  Completed   Zoster Vaccines- Shingrix  Completed   Meningococcal B Vaccine  Aged Out    Health Maintenance  Health Maintenance Due  Topic Date Due   Hepatitis C Screening  Never done   DTaP/Tdap/Td (1 - Tdap) Never done   COVID-19 Vaccine (5 - 2025-26 season) 12/08/2023   Medicare Annual Wellness (AWV)  01/22/2024   Mammogram  12/24/2023    Colorectal cancer screening: Type of screening: Colonoscopy. Completed 06/10/16. Repeat every 10 years  Mammogram status: Completed  12/24/2022. Repeat every year  Bone Density status: Completed 02/03/23. Results reflect: Bone density results: OSTEOPENIA. Repeat every 2 years.  Lung Cancer Screening: (Low Dose CT Chest recommended if Age 36-80 years, 20 pack-year currently smoking OR have quit w/in 15years.) does not qualify.   Lung Cancer Screening Referral: n/a  Additional Screening:  Hepatitis C Screening: does qualify; Completed DUE NOW  Vision Screening: Recommended annual ophthalmology exams for early detection of glaucoma and other disorders of the eye. Is the patient up to date with their annual eye exam?  Yes  Who is the provider or what is the name of the office in which the patient attends annual eye exams? Dr francis nice If pt is not established with a provider, would they like to be referred to a provider to establish care? No .   Dental Screening: Recommended annual dental exams for proper oral hygiene    Community Resource Referral / Chronic Care Management: CRR required this visit?  No   CCM required this visit?  No     Plan:     I have personally reviewed and noted the following in the patient's chart:   Medical and social history Use of alcohol, tobacco or illicit drugs  Current medications and supplements including opioid prescriptions. Patient is not currently taking opioid prescriptions. Functional ability and status Nutritional status Physical activity Advanced directives List of other physicians Hospitalizations, surgeries, and ER visits in previous 12 months Vitals Screenings to include cognitive, depression, and falls Referrals and appointments  In addition, I have reviewed and discussed with patient certain preventive protocols, quality metrics, and best practice recommendations. A written personalized care plan for preventive services as well as general preventive health recommendations were provided to patient.     Arnette LOISE Hoots, CMA   01/27/2024   After Visit  Summary: (Mail) Due to this being a telephonic visit, the after visit summary with patients personalized plan was offered to patient via mail   Nurse Notes: Patient is doing well. She has recently had surgery so she is working on getting back to her routine exercise. She has cut her steps from 10,000 to 5,00 but will continue to work back up to 10,000.

## 2024-01-27 NOTE — Patient Instructions (Signed)
 April Santos,  Thank you for taking the time for your Medicare Wellness Visit. I appreciate your continued commitment to your health goals. Please review the care plan we discussed, and feel free to reach out if I can assist you further.  Medicare recommends these wellness visits once per year to help you and your care team stay ahead of potential health issues. These visits are designed to focus on prevention, allowing your provider to concentrate on managing your acute and chronic conditions during your regular appointments.  Please note that Annual Wellness Visits do not include a physical exam. Some assessments may be limited, especially if the visit was conducted virtually. If needed, we may recommend a separate in-person follow-up with your provider.  Ongoing Care Seeing your primary care provider every 3 to 6 months helps us  monitor your health and provide consistent, personalized care.   Referrals If a referral was made during today's visit and you haven't received any updates within two weeks, please contact the referred provider directly to check on the status.  Recommended Screenings:  Health Maintenance  Topic Date Due   Hepatitis C Screening  Never done   DTaP/Tdap/Td vaccine (1 - Tdap) Never done   COVID-19 Vaccine (5 - 2025-26 season) 12/08/2023   Breast Cancer Screening  12/24/2023   Medicare Annual Wellness Visit  01/26/2025   Colon Cancer Screening  06/11/2026   Pneumococcal Vaccine for age over 43  Completed   Flu Shot  Completed   DEXA scan (bone density measurement)  Completed   Zoster (Shingles) Vaccine  Completed   Meningitis B Vaccine  Aged Out       01/27/2024    9:40 AM  Advanced Directives  Does Patient Have a Medical Advance Directive? Yes  Type of Advance Directive Healthcare Power of Attorney  Does patient want to make changes to medical advance directive? No - Patient declined  Copy of Healthcare Power of Attorney in Chart? No - copy requested    Advance Care Planning is important because it: Ensures you receive medical care that aligns with your values, goals, and preferences. Provides guidance to your family and loved ones, reducing the emotional burden of decision-making during critical moments.  Vision: Annual vision screenings are recommended for early detection of glaucoma, cataracts, and diabetic retinopathy. These exams can also reveal signs of chronic conditions such as diabetes and high blood pressure.  Dental: Annual dental screenings help detect early signs of oral cancer, gum disease, and other conditions linked to overall health, including heart disease and diabetes.  Please see the attached documents for additional preventive care recommendations.

## 2024-01-29 ENCOUNTER — Other Ambulatory Visit: Payer: Self-pay | Admitting: Family Medicine

## 2024-01-29 DIAGNOSIS — M4316 Spondylolisthesis, lumbar region: Secondary | ICD-10-CM

## 2024-02-01 ENCOUNTER — Encounter: Payer: Self-pay | Admitting: Internal Medicine

## 2024-02-01 NOTE — Assessment & Plan Note (Signed)
 Low-carb diet and exercise.  Follow met b and A1c.

## 2024-02-01 NOTE — Assessment & Plan Note (Signed)
 Seeing GI. On aciphex . Planning EGD 03/2024.

## 2024-02-01 NOTE — Assessment & Plan Note (Signed)
 Is s/p L4-L5 >LIF and PSF surgical procedure performed by Dr Clois (12/22/23). Pain has improved. Being followed by pain clinic.

## 2024-02-01 NOTE — Assessment & Plan Note (Signed)
 Reviewed cardiac CT with pulmonary - The nodule is showing multiple characteristics that suggest an perifissural lymph node. It's quite small, but unfortunately the images stop half way through the nodule (cannot completely visualize it). If she is low risk I would feel comfortable not repeating a CT. I do have a low threshold to getting a dedicated chest CT at the one year mark for any patient (especially elderly) with any type of risk (personal history of smoking, significant second hand smoke exposure, occupational exposures, significant time spent commuting, [employer] work, [employer] representative work, family history, etc...). It would allow for a one year surveillance of the nodule and better visualization of the whole lung. Recommend f/u one year after cardiac scan.

## 2024-02-01 NOTE — Assessment & Plan Note (Signed)
 Saw gyn 04/23/22 - PAP - ok.  Recommended repeat in 2025. Request records.

## 2024-02-01 NOTE — Assessment & Plan Note (Signed)
 Recent screening - CT - calcium  score revealed calcium  score 516 (93 percentile). (LAD 203 and RCA 313).  Saw cardiology 11/27/23 - recommended to continue rosuvastatin .  Continue risk factor modification.

## 2024-02-01 NOTE — Assessment & Plan Note (Signed)
 Recent surgery as outlined - 12/24/23. Continue f/u with NSU.

## 2024-02-01 NOTE — Assessment & Plan Note (Signed)
No upper symptoms reported.  Continue aciphex.  

## 2024-02-01 NOTE — Assessment & Plan Note (Signed)
Has been followed by Dr Donneta Romberg.

## 2024-02-01 NOTE — Assessment & Plan Note (Signed)
 With elevated calcium  score, continue crestor .

## 2024-02-01 NOTE — Assessment & Plan Note (Signed)
 Followed by dermatology. Continue otezla .

## 2024-02-03 ENCOUNTER — Ambulatory Visit (INDEPENDENT_AMBULATORY_CARE_PROVIDER_SITE_OTHER)

## 2024-02-03 ENCOUNTER — Ambulatory Visit (INDEPENDENT_AMBULATORY_CARE_PROVIDER_SITE_OTHER): Admitting: Neurosurgery

## 2024-02-03 ENCOUNTER — Encounter: Admitting: Neurosurgery

## 2024-02-03 ENCOUNTER — Encounter: Payer: Self-pay | Admitting: Neurosurgery

## 2024-02-03 VITALS — BP 112/68 | Temp 98.4°F | Ht 60.5 in | Wt 125.0 lb

## 2024-02-03 DIAGNOSIS — M4316 Spondylolisthesis, lumbar region: Secondary | ICD-10-CM

## 2024-02-03 DIAGNOSIS — Z1231 Encounter for screening mammogram for malignant neoplasm of breast: Secondary | ICD-10-CM | POA: Diagnosis not present

## 2024-02-03 DIAGNOSIS — Z981 Arthrodesis status: Secondary | ICD-10-CM

## 2024-02-03 LAB — HM MAMMOGRAPHY

## 2024-02-03 NOTE — Progress Notes (Signed)
 See other note

## 2024-02-03 NOTE — Progress Notes (Signed)
   REFERRING PHYSICIAN:  Glendia Shad, Md [address] [place],  [place] [address]  DOS: 12/22/23  PSF/XLIF L4-L5  HISTORY OF PRESENT ILLNESS: April Santos is status post above surgery.   She is doing well.  She has having some discomfort below her right knee only at nighttime.  This only started 2 weeks ago.    PHYSICAL EXAMINATION:  General: Patient is well developed, well nourished, calm, collected, and in no apparent distress.   NEUROLOGICAL:  General: In no acute distress.   Awake, alert, oriented to person, place, and time.  Pupils equal round and reactive to light.  Facial tone is symmetric.     Strength:      Side Iliopsoas Quads Hamstring PF DF EHL  R 5 5 5 5 5 5   L 5 5 5 5 5 5    Incisions c/d/i   ROS (Neurologic):  Negative except as noted above  IMAGING: No complications noted  ASSESSMENT/PLAN:  April Santos is doing well s/p above surgery.  She may be doing some nerve traction.  She did well for the initial 4 weeks.  Will start moving her Celebrex  dosing to the nighttime and initiate methocarbamol  at nighttime.  I expect that her symptoms will improve.  We discussed activity limitations  [facility] Department of neurosurgery

## 2024-02-04 ENCOUNTER — Encounter: Payer: Self-pay | Admitting: Internal Medicine

## 2024-02-04 ENCOUNTER — Other Ambulatory Visit: Payer: Self-pay | Admitting: Internal Medicine

## 2024-02-04 NOTE — Telephone Encounter (Signed)
Filled by a different provider.

## 2024-02-04 NOTE — Telephone Encounter (Signed)
 See my chart message. Need to clarify how she is taking aciphex  and where wants rx sent.

## 2024-02-04 NOTE — Telephone Encounter (Signed)
 I am ok to refill her aciphex . Need to confirm if she is taking 20mg  q day or bid and where she wants it sent to.  Also, if still having problems with sleep, see if agreeable to schedule an appt to discuss again regarding other treatment options.

## 2024-02-06 MED ORDER — RABEPRAZOLE SODIUM 20 MG PO TBEC: 20.0000 mg | DELAYED_RELEASE_TABLET | Freq: Two times a day (BID) | ORAL | 1 refills | Status: DC

## 2024-02-06 NOTE — Telephone Encounter (Signed)
 Rx sent in for aciphex .

## 2024-02-17 ENCOUNTER — Ambulatory Visit: Admitting: Dermatology

## 2024-02-20 ENCOUNTER — Encounter: Payer: Self-pay | Admitting: Internal Medicine

## 2024-02-20 NOTE — Telephone Encounter (Signed)
Please schedule an appt to discuss

## 2024-02-26 ENCOUNTER — Telehealth: Payer: Self-pay

## 2024-02-26 ENCOUNTER — Ambulatory Visit: Attending: Nurse Practitioner | Admitting: Nurse Practitioner

## 2024-02-26 ENCOUNTER — Encounter: Admitting: Nurse Practitioner

## 2024-02-26 ENCOUNTER — Encounter: Payer: Self-pay | Admitting: Nurse Practitioner

## 2024-02-26 VITALS — BP 106/77 | HR 95 | Temp 96.8°F | Resp 18 | Ht 60.0 in | Wt 121.0 lb

## 2024-02-26 DIAGNOSIS — Z79899 Other long term (current) drug therapy: Secondary | ICD-10-CM | POA: Insufficient documentation

## 2024-02-26 DIAGNOSIS — M48061 Spinal stenosis, lumbar region without neurogenic claudication: Secondary | ICD-10-CM | POA: Insufficient documentation

## 2024-02-26 DIAGNOSIS — M5416 Radiculopathy, lumbar region: Secondary | ICD-10-CM | POA: Diagnosis not present

## 2024-02-26 DIAGNOSIS — M47816 Spondylosis without myelopathy or radiculopathy, lumbar region: Secondary | ICD-10-CM | POA: Insufficient documentation

## 2024-02-26 DIAGNOSIS — G894 Chronic pain syndrome: Secondary | ICD-10-CM | POA: Insufficient documentation

## 2024-02-26 DIAGNOSIS — G8929 Other chronic pain: Secondary | ICD-10-CM | POA: Diagnosis not present

## 2024-02-26 MED ORDER — HYDROCODONE-ACETAMINOPHEN 5-325 MG PO TABS: 1.0000 | ORAL_TABLET | Freq: Two times a day (BID) | ORAL | 0 refills | Status: DC | PRN

## 2024-02-26 NOTE — Progress Notes (Signed)
 PROVIDER NOTE: Interpretation of information contained herein should be left to medically-trained personnel. Specific patient instructions are provided elsewhere under Patient Instructions section of medical record. This document was created in part using AI and STT-dictation technology, any transcriptional errors that may result from this process are unintentional.  Patient: April Santos  Service: E/M   PCP: Glendia Shad, MD  DOB: January 13, 1956  DOS: 02/26/2024  Provider: Emmy MARLA Blanch, NP  MRN: [id number]  Delivery: Face-to-face  Specialty: Interventional Pain Management  Type: Established Patient  Setting: Ambulatory outpatient facility  Specialty designation: 09  Referring Prov.: Glendia Shad, MD  Location: Outpatient office facility       History of present illness (HPI) April Santos, a 68 y.o. year old female, is here today because of her Chronic radicular lumbar pain [M54.16, G89.29]. April Santos's primary complain today is Hip Pain  Pertinent problems: April Santos  has Left shoulder pain; Psoriatic arthritis (HCC); Right hand pain; Osteopenia; Toenail fungus; Right knee pain; Stress; Leg pain, left; Generalized osteoarthritis of hand; HNP (herniated nucleus pulposus), lumbar; Chronic radicular lumbar pain; Osteoarthritis of knee; Sacroiliitis (HCC); Coccyx pain; and Chronic pain Syndrome on their pertinent problem list.   Pain Assessment: Severity of Chronic pain is reported as a 5 /10. Location: Hip Right/Radiates down right leg. Onset: More than a month ago. Quality: Dull, Aching. Timing: Constant. Modifying factor(s): Pain Medication. Vitals:  height is 5' (1.524 m) and weight is 121 lb (54.9 kg). Her temporal temperature is 96.8 F (36 C) (abnormal). Her blood pressure is 106/77 and her pulse is 95. Her respiration is 18 and oxygen saturation is 99%.  BMI: Estimated body mass index is 23.63 kg/m as calculated from the following:   Height as of this encounter: 5' (1.524  m).   Weight as of this encounter: 121 lb (54.9 kg).  Last encounter: 01/08/2024. Last procedure: Visit date not found.  Reason for encounter: medication management. No change in medical history since last visit.  Patient's pain is at baseline.  Patient continues multimodal pain regimen as prescribed.  States that it provides pain relief and improvement in functional status.   Discussed the use of AI scribe software for clinical note transcription with the patient, who gave verbal consent to proceed.  History of Present Illness   April Santos is a 68 year old female with a history of back surgery involving L4 and L5 vertebrae who presents with nerve pain.   She has been experiencing worsening nerve pain over the past week, primarily affecting her legs. Initially, the pain was localized from the knee down but has recently extended from the lower back down to the legs. The pain is described as nerve-related and is more pronounced in the afternoons and evenings, with a recent onset of morning pain as well.  She underwent a procedure on September 15th involving the L4 and L5 vertebrae. Despite initially adhering to post-procedure precautions, recent home renovations have led to increased physical activity, which she believes has aggravated her condition.  For pain management, she has intermittently used hydrocodone , taking it three times in the past week, typically in the mid-afternoon, and has been cautious not to exceed one dose per day. She previously used gabapentin  for nerve pain but discontinued it due to side effects, including grogginess, and prefers not to resume it.  She has been using a heating pad for relief and has found some benefit from an orthopedic pillow. She has a history of weight loss,  having dropped from 142 pounds to 121 pounds over the past year, which she attributes to discontinuing gabapentin . Her appetite has returned, but she struggles to regain the lost weight.      Pharmacotherapy Assessment   Hydrocodone  acetaminophen  (Norco) 5-25 mg tablet every 12 hours as needed for pain for up to 30 days. MME=10  Monitoring: [place] PMP: PDMP reviewed during this encounter.       Pharmacotherapy: No side-effects or adverse reactions reported. Compliance: No problems identified. Effectiveness: Clinically acceptable.  April Santos, April Santos  02/26/2024 10:08 AM  Sign when Signing Visit Nursing Pain Medication Assessment:  Safety precautions to be maintained throughout the outpatient stay will include: orient to surroundings, keep bed in low position, maintain call bell within reach at all times, provide assistance with transfer out of bed and ambulation.  Medication Inspection Compliance: Pill count conducted under aseptic conditions, in front of the patient. Neither the pills nor the bottle was removed from the patient's sight at any time. Once count was completed pills were immediately returned to the patient in their original bottle.  Medication: Hydrocodone /APAP Pill/Patch Count: 5 of 30 pills/patches remain Pill/Patch Appearance: Markings consistent with prescribed medication Bottle Appearance: Standard pharmacy container. Clearly labeled. Filled Date: 09 / 02 / 2025 Last Medication intake:  Yesterday    UDS:  Summary  Date Value Ref Range Status  10/02/2023 FINAL  Final    Comment:    ==================================================================== Compliance Drug Analysis, Ur ==================================================================== Test                             Result       Flag       Units  Drug Present and Declared for Prescription Verification   Gabapentin                      PRESENT      EXPECTED  Drug Present not Declared for Prescription Verification   Oxymorphone                    311          UNEXPECTED ng/mg creat    Sources of oxymorphone include scheduled prescription medications;    it is also an expected metabolite of  oxycodone .  Drug Absent but Declared for Prescription Verification   Alprazolam                      Not Detected UNEXPECTED ng/mg creat   Codeine                         Not Detected UNEXPECTED ng/mg creat   Hydrocodone                     Not Detected UNEXPECTED ng/mg creat   Acetaminophen                   Not Detected UNEXPECTED    Acetaminophen , as indicated in the declared medication list, is not    always detected even when used as directed.    Salicylate                     Not Detected UNEXPECTED    Aspirin, as indicated in the declared medication list, is not always    detected even when used as directed.    Ibuprofen  Not Detected UNEXPECTED    Ibuprofen, as indicated in the declared medication list, is not    always detected even when used as directed.    Guaifenesin                     Not Detected UNEXPECTED ==================================================================== Test                      Result    Flag   Units      Ref Range   Creatinine              75               mg/dL      >=79 ==================================================================== Declared Medications:  The flagging and interpretation on this report are based on the  following declared medications.  Unexpected results may arise from  inaccuracies in the declared medications.   **Note: The testing scope of this panel includes these medications:   Alprazolam  (Xanax )  Codeine   Gabapentin   Guaifenesin   Hydrocodone  (Norco)   **Note: The testing scope of this panel does not include small to  moderate amounts of these reported medications:   Acetaminophen  (Norco)  Aspirin  Ibuprofen (Advil)   **Note: The testing scope of this panel does not include the  following reported medications:   Apremilast  (Otezla )  Crisaborole  (Eucrisa )  Desonide  (Desowen )  Estradiol  (Estrace )  Rabeprazole  (Aciphex )  Rosuvastatin  (Crestor )  Sulfadiazine  (Silvadene )  Supplement   Triamcinolone  (Kenalog )  Vitamin B  Vitamin B12  Vitamin C  Vitamin D3 ==================================================================== For clinical consultation, please call [phone]. ====================================================================     No results found for: CBDTHCR No results found for: D8THCCBX No results found for: D9THCCBX  ROS  Constitutional: Denies any fever or chills Gastrointestinal: No reported hemesis, hematochezia, vomiting, or acute GI distress Musculoskeletal: low back pain radiate to hip  Neurological: No reported episodes of acute onset apraxia, aphasia, dysarthria, agnosia, amnesia, paralysis, loss of coordination, or loss of consciousness  Medication Review  ALPRAZolam , Apremilast , B-complex with vitamin C, Benefiber, Biotin-Vitamin C, Crisaborole , HYDROcodone -acetaminophen , Melatonin, RABEprazole , Vitamin B-12, acetaminophen , aspirin EC, celecoxib , cholecalciferol, desonide , diphenhydrAMINE , estradiol , hydrocortisone  cream, multivitamin with minerals, polyethylene glycol, rosuvastatin , senna, and triamcinolone  cream  History Review  Allergy: April Santos is allergic to cefdinir and erythromycin ethylsuccinate [erythromycin]. Drug: April Santos  reports no history of drug use. Alcohol:  reports that she does not currently use alcohol. Tobacco:  reports that she has never smoked. She has never used smokeless tobacco. Social: April Santos  reports that she has never smoked. She has never used smokeless tobacco. She reports that she does not currently use alcohol. She reports that she does not use drugs. Medical:  has a past medical history of Anxiety, Aortic atherosclerosis, CAD (coronary artery disease), Cholelithiasis, Colon polyps, COVID-19 (2022), DDD (degenerative disc disease), lumbar, Dysphagia, Family history of adverse reaction to anesthesia, GERD (gastroesophageal reflux disease), History of uterine fibroid, Hyperlipidemia,  Long-term current use of immunomodulator, Long-term use of aspirin therapy, Lumbar stenosis, Lung nodule, Osteoarthritis, Psoriatic arthritis (HCC), Varicose vein of leg, Venous insufficiency, and Vitamin D  deficiency. Surgical: Ms. Klostermann  has a past surgical history that includes Uterine fibroid surgery (1997); Breast surgery (1999); Foot surgery (02/07/1003); carpal (2005); Sacroiliac joint injection (08/17/13 & 04/13/14); Colonoscopy with esophagogastroduodenoscopy (egd); Abdominal hysterectomy; Sigmoidoscopy; Colonoscopy with propofol  (N/A, 06/10/2016); Esophagogastroduodenoscopy (egd) with propofol  (N/A, 06/10/2016); Carpometacarpal (cmc) fusion of thumb (Right, 11/16/2020); Reverse shoulder arthroplasty (Left, 03/04/2023); Varicose vein surgery;  Anterior lateral lumbar fusion with percutaneous screw 1 level (N/A, 12/22/2023); and Application of intraoperative CT scan (N/A, 12/22/2023). Family: family history includes Arthritis in her father; Breast cancer in her mother; Coronary artery disease in her father; Diabetes in her brother and maternal uncle; GER disease in her father; Heart attack in her father.  Laboratory Chemistry Profile   Renal Lab Results  Component Value Date   BUN 11 01/20/2024   CREATININE 0.73 01/20/2024   GFR 83.79 01/20/2024   GFRNONAA >60 02/25/2023    Hepatic Lab Results  Component Value Date   AST 14 01/20/2024   ALT 8 01/20/2024   ALBUMIN 4.3 01/20/2024   ALKPHOS 109 01/20/2024    Electrolytes Lab Results  Component Value Date   NA 141 01/20/2024   K 4.1 01/20/2024   CL 105 01/20/2024   CALCIUM  9.4 01/20/2024    Bone Lab Results  Component Value Date   VD25OH 50.93 05/01/2023    Inflammation (CRP: Acute Phase) (ESR: Chronic Phase) Lab Results  Component Value Date   CRP 0.2 (L) 06/03/2016   ESRSEDRATE 3 06/03/2016         Note: Above Lab results reviewed.  Recent Imaging Review  DG Lumbar Spine 2-3 Views EXAM: 2 or 3 VIEW(S) XRAY OF THE  LUMBAR SPINE 02/03/2024 02:37:44 PM  COMPARISON: Comparison made to 09/09/2023.  CLINICAL HISTORY: Lumbar fusion. Lumbar fusion follow up.  FINDINGS:  LUMBAR SPINE: BONES: Interval L4-L5 anterior and posterior lumbar fusion with instrumentation. Improved grade 1 (7 mm) anterolisthesis L4-L5. Mild lumbar levoscoliosis, apex left at L3 again noted. No acute fracture or traumatic listhesis. No aggressive appearing osseous lesion.  DISCS AND DEGENERATIVE CHANGES: Disc space narrowing and endplate remodeling throughout the lumbar spine, most severe at L3-L4 and L5-S1 again seen in keeping with changes of moderate-to-severe degenerative disc disease.  SOFT TISSUES: No acute abnormality.  IMPRESSION: 1. Interval L4-5 anterior and posterior lumbar fusion with instrumentation and improved grade 1 (7 mm) anterolisthesis L4-5. 2. Mild lumbar levoscoliosis, apex left at L3. 3. Disc space narrowing and endplate remodeling throughout the lumbar spine, most severe at L3-4 and L5-S1, consistent with moderate-to-severe degenerative disc disease. 4. No acute fracture or traumatic listhesis.  Electronically signed by: Dorethia Molt MD 02/05/2024 12:59 AM EDT RP Workstation: [id number] Note: Reviewed        Physical Exam  Vitals: BP 106/77 (BP Location: Right Arm, Patient Position: Sitting, Cuff Size: Normal)   Pulse 95   Temp (!) 96.8 F (36 C) (Temporal)   Resp 18   Ht 5' (1.524 m)   Wt 121 lb (54.9 kg)   LMP 02/26/2002   SpO2 99%   BMI 23.63 kg/m  BMI: Estimated body mass index is 23.63 kg/m as calculated from the following:   Height as of this encounter: 5' (1.524 m).   Weight as of this encounter: 121 lb (54.9 kg). Ideal: Ideal body weight: 45.5 kg (100 lb 4.9 oz) Adjusted ideal body weight: 49.3 kg (108 lb 9.4 oz) General appearance: Well nourished, well developed, and well hydrated. In no apparent acute distress Mental status: Alert, oriented x 3 (person, place, & time)        Respiratory: No evidence of acute respiratory distress Eyes: PERLA  Musculoskeletal: +LBP Assessment   Diagnosis Status  1. Chronic radicular lumbar pain   2. Medication management   3. Chronic pain syndrome   4. Lumbar facet arthropathy   5. Lumbar radiculopathy   6. 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 Pain rated 5/10, worse in mornings. Gabapentin  discontinued due to side effects. Hydrocodone  used intermittently. Ibuprofen now safe post-surgery. Celebrex  ineffective. - Continue hydrocodone  as needed, max one dose/day. - Use ibuprofen as needed. - Avoid lifting over 25 pounds. - Use heating pad, avoid direct skin contact. - Prescribed hydrocodone  for an additional month.  Chronic pain syndrome Managed with hydrocodone  and Tylenol . Prefers minimal narcotic use. Gabapentin  discontinued due to side effects. Methocarbamol  not in use. Concerned about weight loss post-gabapentin , expected to stabilize. - Continue hydrocodone  and Tylenol . - Consider ibuprofen for additional relief. - Monitor weight and nutritional intake.  Patient's pain is controlled with hydrocodone , will continue on current medication regimen.  Prescribing drug monitoring (PDMP) reviewed, findings consistent with the use of prescribed medication and no evidence of narcotic misuse or abuse.  Urine drug screening (UDS) up to date.  The patient was advised to take ibuprofen as needed for post op pain.  The patient was also advised using heating pad for pain relief.  Schedule follow-up as needed for med management.      April Santos has a current medication list which includes the  following long-term medication(s): diphenhydramine , rabeprazole , and rosuvastatin .  Pharmacotherapy (Medications Ordered): Meds ordered this encounter  Medications   HYDROcodone -acetaminophen  (NORCO/VICODIN) 5-325 MG tablet    Sig: Take 1 tablet by mouth every 12 (twelve) hours as needed for severe pain (pain score 7-10). Must last 30 days    Dispense:  60 tablet    Refill:  0    Chronic Pain: STOP Act (Not applicable) Fill 1 day early if closed on refill date. Avoid benzodiazepines within 8 hours of opioids   HYDROcodone -acetaminophen  (NORCO/VICODIN) 5-325 MG tablet    Sig: Take 1 tablet by mouth every 12 (twelve) hours as needed for severe pain (pain score 7-10). Must last 30 days    Dispense:  60 tablet    Refill:  0    Chronic Pain: STOP Act (Not applicable) Fill 1 day early if closed on refill date. Avoid benzodiazepines within 8 hours of opioids   Orders:  No orders of the defined types were placed in this encounter.       Return for (PRN), Emmy Blanch NP, (F2F).    Recent Visits Date Type Provider Dept  01/08/24 Office Visit Nikia Levels K, NP [facility]  12/09/23 Office Visit Noralee Dutko K, NP [facility]  Showing recent visits within past 90 days and meeting all other requirements Today's Visits Date Type Provider Dept  02/26/24 Office Visit Tzion Wedel K, NP [facility]  Showing today's visits and meeting all other requirements Future Appointments No visits were found meeting these conditions. Showing future appointments within next 90 days and meeting all other requirements  I discussed the assessment and treatment plan with the patient. The patient was provided an opportunity to ask questions and all were answered. The patient agreed with the plan and demonstrated an understanding of the instructions.  Patient advised to call back or seek an in-person evaluation if the symptoms or condition worsens.  I personally spent a total of  30 minutes in the care of the patient today including  preparing to see the patient, getting/reviewing separately obtained history, performing a medically appropriate exam/evaluation, counseling and educating, placing orders, referring and communicating with other health care professionals, documenting clinical information in the EHR, independently interpreting results, communicating results, and coordinating care.   Note by: Drishti Pepperman K Catricia Scheerer, NP (TTS and AI technology used. I apologize for any typographical errors that were not detected and corrected.) Date: 02/26/2024; Time: 12:00 PM

## 2024-02-26 NOTE — Telephone Encounter (Signed)
 Patient unable to get [employer] PAP due to not meeting income guidelines. Sample left of Otezla  left at front desk to help patient get to appt with Dr. Hester to discuss treatment options. aw

## 2024-02-26 NOTE — Progress Notes (Signed)
 Nursing Pain Medication Assessment:  Safety precautions to be maintained throughout the outpatient stay will include: orient to surroundings, keep bed in low position, maintain call bell within reach at all times, provide assistance with transfer out of bed and ambulation.  Medication Inspection Compliance: Pill count conducted under aseptic conditions, in front of the patient. Neither the pills nor the bottle was removed from the patient's sight at any time. Once count was completed pills were immediately returned to the patient in their original bottle.  Medication: Hydrocodone /APAP Pill/Patch Count: 5 of 30 pills/patches remain Pill/Patch Appearance: Markings consistent with prescribed medication Bottle Appearance: Standard pharmacy container. Clearly labeled. Filled Date: 09 / 02 / 2025 Last Medication intake:  Yesterday

## 2024-02-28 ENCOUNTER — Encounter: Payer: Self-pay | Admitting: Internal Medicine

## 2024-03-08 DIAGNOSIS — Z96612 Presence of left artificial shoulder joint: Secondary | ICD-10-CM | POA: Diagnosis not present

## 2024-03-08 DIAGNOSIS — M75122 Complete rotator cuff tear or rupture of left shoulder, not specified as traumatic: Secondary | ICD-10-CM | POA: Diagnosis not present

## 2024-03-08 DIAGNOSIS — M7582 Other shoulder lesions, left shoulder: Secondary | ICD-10-CM | POA: Diagnosis not present

## 2024-03-10 ENCOUNTER — Ambulatory Visit: Attending: Internal Medicine | Admitting: Internal Medicine

## 2024-03-10 ENCOUNTER — Encounter: Payer: Self-pay | Admitting: Internal Medicine

## 2024-03-10 VITALS — BP 100/70 | HR 56 | Ht 60.0 in | Wt 126.2 lb

## 2024-03-10 DIAGNOSIS — I2510 Atherosclerotic heart disease of native coronary artery without angina pectoris: Secondary | ICD-10-CM | POA: Diagnosis not present

## 2024-03-10 NOTE — Patient Instructions (Signed)
 Medication Instructions:  Your physician recommends that you continue on your current medications as directed. Please refer to the Current Medication list given to you today.    *If you need a refill on your cardiac medications before your next appointment, please call your pharmacy*  Lab Work: No labs ordered today    Testing/Procedures: Your provider has ordered a exercise tolerance test. This test will evaluate the blood supply to your heart muscle during periods of exercise and rest. For this test, you will raise your heart rate by walking on a treadmill at different levels.   you may eat a light breakfast/ lunch prior to your procedure no caffeine for 24 hours prior to your test (coffee, tea, soft drinks, or chocolate)  no smoking/ vaping for 4 hours prior to your test you may take your regular medications the day of your test s with you to your test wear comfortable clothing & tennis/ non-skid shoes to walk on the treadmill  This will take place at [address] [facility] [address] [facility] [address])  [place] [address]   Follow-Up: At [facility], you and your health needs are our priority.  As part of our continuing mission to provide you with exceptional heart care, our providers are all part of one team.  This team includes your primary Cardiologist (physician) and Advanced Practice Providers or APPs (Physician Assistants and Nurse Practitioners) who all work together to provide you with the care you need, when you need it.  Your next appointment:   As needed  Provider:   You may see Lonni Hanson, MD or one of the following Advanced Practice Providers on your designated Care Team:   Lonni Meager, NP Lesley Maffucci, PA-C Bernardino Bring, PA-C Cadence [place], PA-C Tylene Lunch, NP Barnie Hila, NP

## 2024-03-10 NOTE — Progress Notes (Signed)
 Thank you for the update.  I will see her tomorrow and plan to add the lab to her next lab draw.  Let me know if there is anything more you need me to do.  Hope you had a nice Thanksgiving.  Leshawn Houseworth

## 2024-03-10 NOTE — Progress Notes (Signed)
 Cardiology Office Note:  .   Date:  03/10/2024  ID:  HURLEY BLEVINS, DOB March 25, 1956, MRN [id number] PCP: Glendia Shad, MD  [facility] Providers Cardiologist:  None     History of Present Illness: .   April Santos is a 68 y.o. female with history of coronary artery calcification, venous insufficiency, degenerative disc disease, GERD, and psoriasis, who presents for follow-up of coronary artery calcification.  I met her in August, at which time April Santos reported that she had been feeling well other than progressive chronic low back and hip pain.  She had previously been quite active until about a year ago when her ability to walk extended distances became limited by her musculoskeletal pain.  We discussed role for ischemia testing but agreed to defer this in favor of continued medical optimization.  Today, April Santos reports that she is feeling quite well.  Her back surgery after our last visit with successful.  She is now back to walking 4 to 5 miles several days a week without limitations.  She denies chest pain, shortness of breath, palpitations, and lightheadedness.  She noted some leg swelling after her back surgery while sedentary, though this has since resolved.  She is tolerating low-dose rosuvastatin  well.  ROS: See HPI  Studies Reviewed: SABRA   EKG Interpretation Date/Time:  Wednesday March 10 2024 13:23:29 EST Ventricular Rate:  56 PR Interval:  148 QRS Duration:  74 QT Interval:  384 QTC Calculation: 370 R Axis:   13  Text Interpretation: Sinus bradycardia Low voltage QRS Nonspecific ST and T wave abnormality Abnormal ECG When compared with ECG of 27-Nov-2023 08:44, No significant change was found Confirmed by Kostas Marrow, Lonni [phone]) on 03/10/2024 1:26:17 PM    Coronary calcium  score (09/24/2023): Coronary calcium  score 516 (93rd percentile for age and sex matched controls) predominantly affecting the LAD and RCA. Aortic atherosclerosis also noted.   Risk  Assessment/Calculations:             Physical Exam:   VS:  BP 100/70 (BP Location: Left Arm, Patient Position: Sitting, Cuff Size: Normal)   Pulse (!) 56 Comment: 70 oximeter  Ht 5' (1.524 m)   Wt 126 lb 3.2 oz (57.2 kg)   LMP 02/26/2002   SpO2 98%   BMI 24.65 kg/m    Wt Readings from Last 3 Encounters:  03/10/24 126 lb 3.2 oz (57.2 kg)  02/26/24 121 lb (54.9 kg)  02/03/24 125 lb (56.7 kg)    General:  NAD. Neck: No JVD or HJR. Lungs: Clear to auscultation bilaterally without wheezes or crackles. Heart: Regular rate and rhythm without murmurs, rubs, or gallops. Abdomen: Soft, nontender, nondistended. Extremities: No lower extremity edema.  ASSESSMENT AND PLAN: .    Coronary artery calcification: April Santos continues to feel well and is now back to walking 4-5 miles several times a week without any difficulty.  However, given her high coronary calcium  score, we have agreed to perform an exercise tolerance test to exclude obstructive CAD.  Unless the study is high risk, I would favor deferral of additional testing unless she becomes symptomatic.  Continue aspirin and statin therapy, given excellent LDL control.  It would be reasonable to check an LP(a) as well with her next blood draw.  I will reach out to Dr. Glendia at the patient's request to see if this can be arranged at their upcoming visit.    Informed Consent   Shared Decision Making/Informed Consent The risks [chest pain, shortness  of breath, cardiac arrhythmias, dizziness, blood pressure fluctuations, myocardial infarction, stroke/transient ischemic attack, and life-threatening complications (estimated to be 1 in 10,000)], benefits (risk stratification, diagnosing coronary artery disease, treatment guidance) and alternatives of an exercise tolerance test were discussed in detail with April Santos and she agrees to proceed.     Dispo: Return to clinic as needed if exercise tolerance test is abnormal or symptoms  develop.  Signed, Lonni Hanson, MD

## 2024-03-11 ENCOUNTER — Ambulatory Visit: Admitting: Internal Medicine

## 2024-03-11 ENCOUNTER — Encounter: Payer: Self-pay | Admitting: Internal Medicine

## 2024-03-11 VITALS — BP 112/60 | HR 64 | Ht 60.0 in | Wt 123.0 lb

## 2024-03-11 DIAGNOSIS — I2510 Atherosclerotic heart disease of native coronary artery without angina pectoris: Secondary | ICD-10-CM

## 2024-03-11 MED ORDER — GABAPENTIN 100 MG PO CAPS: 100.0000 mg | ORAL_CAPSULE | Freq: Every day | ORAL | 2 refills | Status: DC

## 2024-03-11 NOTE — Progress Notes (Signed)
 Subjective:    Patient ID: April Santos, female    DOB: 1955-12-08, 67 y.o.   MRN: [id number]  Patient here for  Chief Complaint  Patient presents with   Medical Management of Chronic Issues    HPI Here for a scheduled follow up. Is s/p L4-L5 >LIF and PSF surgical procedure performed by Dr Clois (12/22/23). Pain has improved. Had f/u 02/03/24 - Dr Clois. Recommended celebrex  q hs and methocarbamol  q hs. Saw GI 10/02/23 - dysphagia. Recommended EGD. GERD controlled on aciphex  20mg  bid. F/u constipation as well. Recommended mirilax. UTD on colonoscopy. Planning EGD in 03/2024. Continues on otezla . Had f/u with Dr Edie - 03/08/24- stable. Saw Dr End 03/10/24 - recommended exercise tolerance test. Recommended continue aspirin and statin. Check LP(a) with next labs. She is walking - 5 miles per day. Some pain right lateral leg. Overall doing much better. Breathing stable. Eating. No vomiting or diarrhea reported.    Past Medical History:  Diagnosis Date   Anxiety    a.) on BZO PRN (alprazolam )   Aortic atherosclerosis    CAD (coronary artery disease)    Cholelithiasis    Colon polyps    COVID-19 2022   DDD (degenerative disc disease), lumbar    Dysphagia    Family history of adverse reaction to anesthesia    a.) PONV in 1st degree relative (mother)   GERD (gastroesophageal reflux disease)    History of uterine fibroid    Hyperlipidemia    Long-term current use of immunomodulator    a.) on DMARD therapy (apremilast ) for psoriatic arthritis   Long-term use of aspirin therapy    Lumbar stenosis    Lung nodule    Osteoarthritis    Psoriatic arthritis (HCC)    a.) Tx'd with DMARD therapy (apremilast )   Varicose vein of leg    Venous insufficiency    Vitamin D  deficiency    Past Surgical History:  Procedure Laterality Date   ABDOMINAL HYSTERECTOMY     ANTERIOR LATERAL LUMBAR FUSION WITH PERCUTANEOUS SCREW 1 LEVEL N/A 12/22/2023   Procedure: ANTERIOR LATERAL LUMBAR FUSION  WITH PERCUTANEOUS SCREW 1 LEVEL;  Surgeon: Clois Fret, MD;  Location: [facility] ORS;  Service: Neurosurgery;  Laterality: N/A;  L4-5 LATERAL LUMBAR INTERBODY FUSION AND POSTERIOR SPINAL FUSION   APPLICATION OF INTRAOPERATIVE CT SCAN N/A 12/22/2023   Procedure: APPLICATION OF INTRAOPERATIVE CT SCAN;  Surgeon: Clois Fret, MD;  Location: [facility];  Service: Neurosurgery;  Laterality: N/A;   BREAST SURGERY  1999   biopsy   carpal  2005   Tunnel right hand   CARPOMETACARPAL (CMC) FUSION OF THUMB Right 11/16/2020   Procedure: Right CARPOMETACARPAL [facility]) ARTHROPLASTY;  Surgeon: Kathlynn Sharper, MD;  Location: [facility] ORS;  Service: Orthopedics;  Laterality: Right;   COLONOSCOPY WITH ESOPHAGOGASTRODUODENOSCOPY (EGD)     COLONOSCOPY WITH PROPOFOL  N/A 06/10/2016   Procedure: COLONOSCOPY WITH PROPOFOL ;  Surgeon: Lamar ONEIDA Holmes, MD;  Location: [facility] ENDOSCOPY;  Service: Endoscopy;  Laterality: N/A;   ESOPHAGOGASTRODUODENOSCOPY (EGD) WITH PROPOFOL  N/A 06/10/2016   Procedure: ESOPHAGOGASTRODUODENOSCOPY (EGD) WITH PROPOFOL ;  Surgeon: Lamar ONEIDA Holmes, MD;  Location: [facility] ENDOSCOPY;  Service: Endoscopy;  Laterality: N/A;   FOOT SURGERY  02/07/1003   right foot benign tumor   REVERSE SHOULDER ARTHROPLASTY Left 03/04/2023   Procedure: REVERSE SHOULDER ARTHROPLASTY;  Surgeon: Edie Norleen PARAS, MD;  Location: [facility] ORS;  Service: Orthopedics;  Laterality: Left;   SACROILIAC JOINT INJECTION  08/17/13 & 04/13/14   Dr. Avanell   SIGMOIDOSCOPY  UTERINE FIBROID SURGERY  1997   VARICOSE VEIN SURGERY     Family History  Problem Relation Age of Onset   Breast cancer Mother        13/74   Heart attack Father    Arthritis Father    GER disease Father    Coronary artery disease Father        23   Diabetes Brother    Diabetes Maternal Uncle    Colon cancer Neg Hx    Social History   Socioeconomic History   Marital status: Significant Other    Spouse name: Vinie Molt   Number of children: 0   Years of  education: Not on file   Highest education level: Not on file  Occupational History   Not on file  Tobacco Use   Smoking status: Never   Smokeless tobacco: Never  Vaping Use   Vaping status: Never Used  Substance and Sexual Activity   Alcohol use: Not Currently    Comment: rare   Drug use: No   Sexual activity: Yes    Birth control/protection: Surgical    Comment: Hysterectomy  Other Topics Concern   Not on file  Social History Narrative   Vinie Molt - POA   Social Drivers of Health   Tobacco Use: Low Risk (03/11/2024)   Patient History    Smoking Tobacco Use: Never    Smokeless Tobacco Use: Never    Passive Exposure: Not on file  Financial Resource Strain: Low Risk (01/27/2024)   Overall Financial Resource Strain (CARDIA)    Difficulty of Paying Living Expenses: Not very hard  Food Insecurity: No Food Insecurity (01/27/2024)   Epic    Worried About Radiation Protection Practitioner of Food in the Last Year: Never true    Ran Out of Food in the Last Year: Never true  Transportation Needs: No Transportation Needs (01/27/2024)   Epic    Lack of Transportation (Medical): No    Lack of Transportation (Non-Medical): No  Physical Activity: Sufficiently Active (01/27/2024)   Exercise Vital Sign    Days of Exercise per Week: 5 days    Minutes of Exercise per Session: 60 min  Stress: No Stress Concern Present (01/27/2024)   [employer] of Occupational Health - Occupational Stress Questionnaire    Feeling of Stress: Not at all  Social Connections: Moderately Integrated (01/27/2024)   Social Connection and Isolation Panel    Frequency of Communication with Friends and Family: Twice a week    Frequency of Social Gatherings with Friends and Family: Twice a week    Attends Religious Services: More than 4 times per year    Active Member of Clubs or Organizations: No    Attends Banker Meetings: Never    Marital Status: Living with partner  Depression (PHQ2-9): Low Risk  (03/11/2024)   Depression (PHQ2-9)    PHQ-2 Score: 1  Alcohol Screen: Low Risk (01/22/2023)   Alcohol Screen    Last Alcohol Screening Score (AUDIT): 1  Housing: Low Risk (01/27/2024)   Epic    Unable to Pay for Housing in the Last Year: No    Number of Times Moved in the Last Year: 0    Homeless in the Last Year: No  Utilities: Not At Risk (01/27/2024)   Epic    Threatened with loss of utilities: No  Health Literacy: Adequate Health Literacy (01/27/2024)   B1300 Health Literacy    Frequency of need for help with medical instructions: Never  Review of Systems  Constitutional:  Negative for appetite change and unexpected weight change.  HENT:  Negative for congestion and sinus pressure.   Respiratory:  Negative for cough, chest tightness and shortness of breath.   Cardiovascular:  Negative for chest pain, palpitations and leg swelling.  Gastrointestinal:  Negative for abdominal pain, diarrhea and vomiting.  Genitourinary:  Negative for difficulty urinating and dysuria.  Musculoskeletal:        Pain - better. Walking. Some right lateral leg pain.   Skin:  Negative for color change and rash.  Neurological:  Negative for dizziness and headaches.  Psychiatric/Behavioral:  Negative for agitation and dysphoric mood.        Objective:     BP 112/60   Pulse 64   Ht 5' (1.524 m)   Wt 123 lb (55.8 kg)   LMP 02/26/2002   SpO2 100%   BMI 24.02 kg/m  Wt Readings from Last 3 Encounters:  03/11/24 123 lb (55.8 kg)  03/10/24 126 lb 3.2 oz (57.2 kg)  02/26/24 121 lb (54.9 kg)    Physical Exam Vitals reviewed.  Constitutional:      General: She is not in acute distress.    Appearance: Normal appearance.  HENT:     Head: Normocephalic and atraumatic.     Right Ear: External ear normal.     Left Ear: External ear normal.     Mouth/Throat:     Pharynx: No oropharyngeal exudate or posterior oropharyngeal erythema.  Eyes:     General: No scleral icterus.       Right eye: No  discharge.        Left eye: No discharge.     Conjunctiva/sclera: Conjunctivae normal.  Neck:     Thyroid : No thyromegaly.  Cardiovascular:     Rate and Rhythm: Normal rate and regular rhythm.  Pulmonary:     Effort: No respiratory distress.     Breath sounds: Normal breath sounds. No wheezing.  Abdominal:     General: Bowel sounds are normal.     Palpations: Abdomen is soft.     Tenderness: There is no abdominal tenderness.  Musculoskeletal:        General: No swelling or tenderness.     Cervical back: Neck supple. No tenderness.  Lymphadenopathy:     Cervical: No cervical adenopathy.  Skin:    Findings: No erythema or rash.  Neurological:     Mental Status: She is alert.  Psychiatric:        Mood and Affect: Mood normal.        Behavior: Behavior normal.         Outpatient Encounter Medications as of 03/11/2024  Medication Sig   gabapentin  (NEURONTIN ) 100 MG capsule Take 1 capsule (100 mg total) by mouth at bedtime.   acetaminophen  (TYLENOL ) 500 MG tablet Take 2 tablets (1,000 mg total) by mouth every 6 (six) hours as needed.   ALPRAZolam  (XANAX ) 0.25 MG tablet Take 1 tablet (0.25 mg total) by mouth daily as needed.   Apremilast  (OTEZLA ) 30 MG TABS Take 1 tablet (30 mg total) by mouth daily.   aspirin EC 81 MG tablet Take 81 mg by mouth daily. Swallow whole.   B Complex-C (B-COMPLEX WITH VITAMIN C) tablet Take 1 tablet by mouth daily.   Biotin-Vitamin C (HAIR SKIN NAILS GUMMIES PO) Take 2 tablets by mouth daily. Olly   cholecalciferol (VITAMIN D3) 25 MCG (1000 UNIT) tablet Take 1,000 Units by mouth daily.   Crisaborole  (  EUCRISA ) 2 % OINT Apply 1 application topically 2 (two) times daily as needed (psoriasis).   Cyanocobalamin (VITAMIN B-12) 3000 MCG SUBL Place 6,000 mcg under the tongue daily at 2 am. Gummy   desonide  (DESOWEN ) 0.05 % cream Apply 1 Application topically 2 (two) times daily as needed (psoriasis). For 1-2 wks   diphenhydrAMINE  (BENADRYL ) 25 mg capsule Take  1 capsule (25 mg total) by mouth every 6 (six) hours as needed for itching.   estradiol  (ESTRACE  VAGINAL) 0.1 MG/GM vaginal cream Insert 1 gram vaginally 2 times weekly as maintenance   hydrocortisone  cream 1 % Apply 1 Application topically 2 (two) times daily as needed for itching.   Multiple Vitamins-Minerals (MULTIVITAMIN WITH MINERALS) tablet Take 1 tablet by mouth daily. Olly   polyethylene glycol (MIRALAX  / GLYCOLAX ) 17 g packet Take 17 g by mouth daily as needed for mild constipation.   RABEprazole  (ACIPHEX ) 20 MG tablet Take 1 tablet (20 mg total) by mouth 2 (two) times daily.   rosuvastatin  (CRESTOR ) 5 MG tablet Take 1 tablet (5 mg total) by mouth daily.   senna (SENOKOT) 8.6 MG TABS tablet Take 1 tablet (8.6 mg total) by mouth 2 (two) times daily as needed for mild constipation.   triamcinolone  cream (KENALOG ) 0.1 % Apply 1 Application topically 2 (two) times daily as needed. For up to 2 weeks. Avoid applying to face, groin, and axilla. Use as directed. Long-term use can cause thinning of the skin.   Wheat Dextrin (BENEFIBER) CHEW Chew 2 tablets by mouth every morning. Gummy   [DISCONTINUED] HYDROcodone -acetaminophen  (NORCO/VICODIN) 5-325 MG tablet Take 1 tablet by mouth every 12 (twelve) hours as needed for severe pain (pain score 7-10). Must last 30 days   [DISCONTINUED] HYDROcodone -acetaminophen  (NORCO/VICODIN) 5-325 MG tablet Take 1 tablet by mouth every 12 (twelve) hours as needed for moderate pain (pain score 4-6).   [DISCONTINUED] HYDROcodone -acetaminophen  (NORCO/VICODIN) 5-325 MG tablet Take 1 tablet by mouth every 12 (twelve) hours as needed for severe pain (pain score 7-10). Must last 30 days   No facility-administered encounter medications on file as of 03/11/2024.     Lab Results  Component Value Date   WBC 3.4 (L) 01/20/2024   HGB 12.2 01/20/2024   HCT 36.8 01/20/2024   PLT 262.0 01/20/2024   GLUCOSE 98 01/20/2024   CHOL 133 01/20/2024   TRIG 68.0 01/20/2024   HDL  60.80 01/20/2024   LDLCALC 59 01/20/2024   ALT 8 01/20/2024   AST 14 01/20/2024   NA 141 01/20/2024   K 4.1 01/20/2024   CL 105 01/20/2024   CREATININE 0.73 01/20/2024   BUN 11 01/20/2024   CO2 28 01/20/2024   TSH 1.08 05/01/2023   HGBA1C 5.7 01/20/2024    DG Lumbar Spine 2-3 Views Result Date: 12/22/2023 CLINICAL DATA:  Elective surgery. EXAM: LUMBAR SPINE - 2-3 VIEW COMPARISON:  Preoperative imaging FINDINGS: Eight fluoroscopic spot views as well as rete C-arm images submitted from the operating room. Posterior rod and pedicle screw fixation with interbody spacer at L4-L5. Fluoroscopy time 1 minutes 53 seconds. Dose 114.32 mGy. IMPRESSION: Intraoperative fluoroscopy during lumbar fusion. Electronically Signed   By: Andrea Gasman M.D.   On: 12/22/2023 19:15   DG C-Arm 1-60 Min-No Report Result Date: 12/22/2023 Fluoroscopy was utilized by the requesting physician.  No radiographic interpretation.   DG C-Arm 1-60 Min-No Report Result Date: 12/22/2023 Fluoroscopy was utilized by the requesting physician.  No radiographic interpretation.       Assessment & Plan:  Coronary artery disease involving native coronary artery of native heart without angina pectoris Assessment & Plan:  Recent screening - CT - calcium  score revealed calcium  score 516 (93 percentile). (LAD 203 and RCA 313).  Saw cardiology 11/27/23 - recommended to continue rosuvastatin .  Continue risk factor modification. Had f/u with cardiology - Saw Dr End 03/10/24 - recommended exercise tolerance test. Scheduled for next week. Recommended continue aspirin and statin. Check LP(a) with next labs.  Orders: -     Lipoprotein A (LPA); Future  Vitamin D  deficiency Assessment & Plan: Vitamiin D level wnl 05/01/23.    Stress Assessment & Plan: Overall appears to be doing better. Follow.    Psoriatic arthritis (HCC) Assessment & Plan: Followed by dermatology. Continue otezla .    Lung nodule Assessment & Plan: Reviewed  cardiac CT with pulmonary - The nodule is showing multiple characteristics that suggest an perifissural lymph node. It's quite small, but unfortunately the images stop half way through the nodule (cannot completely visualize it). If she is low risk I would feel comfortable not repeating a CT. I do have a low threshold to getting a dedicated chest CT at the one year mark for any patient (especially elderly) with any type of risk (personal history of smoking, significant second hand smoke exposure, occupational exposures, significant time spent commuting, [employer] company work, holiday representative work, family history, etc...). It would allow for a one year surveillance of the nodule and better visualization of the whole lung. Recommend f/u one year after cardiac scan.    LGSIL Pap smear of vagina Assessment & Plan: Saw gyn 04/23/22 - PAP - ok.  Recommended repeat in 2025.  Need records.    Hyperlipidemia LDL goal <70 Assessment & Plan: With elevated calcium  score, continue crestor .    Hyperglycemia Assessment & Plan: Low carb diet and exercise. Follow met b and A1c.    Gastroesophageal reflux disease without esophagitis Assessment & Plan: No upper symptoms reported. Continue aciphex .    Elevated coronary artery calcium  score Assessment & Plan: Saw Dr End 03/10/24 - recommended exercise tolerance test. Scheduled for next week. Recommended continue aspirin and statin. Check LP(a) with next labs.   Other orders -     Gabapentin ; Take 1 capsule (100 mg total) by mouth at bedtime.  Dispense: 30 capsule; Refill: 2     Allena Hamilton, MD

## 2024-03-15 ENCOUNTER — Other Ambulatory Visit: Payer: Self-pay | Admitting: Orthopedic Surgery

## 2024-03-15 DIAGNOSIS — Z981 Arthrodesis status: Secondary | ICD-10-CM

## 2024-03-16 ENCOUNTER — Ambulatory Visit: Admitting: Dermatology

## 2024-03-18 ENCOUNTER — Ambulatory Visit: Admission: RE | Admit: 2024-03-18 | Discharge: 2024-03-18 | Attending: Internal Medicine | Admitting: Internal Medicine

## 2024-03-18 DIAGNOSIS — I2510 Atherosclerotic heart disease of native coronary artery without angina pectoris: Secondary | ICD-10-CM

## 2024-03-18 LAB — EXERCISE TOLERANCE TEST
Angina Index: 0
Estimated workload: 7
Exercise duration (min): 6 min
Exercise duration (sec): 0 s
MPHR: 128 {beats}/min
Peak HR: 144 {beats}/min
Percent HR: 95 %
Rest HR: 63 {beats}/min

## 2024-03-19 ENCOUNTER — Ambulatory Visit: Payer: Self-pay | Admitting: Internal Medicine

## 2024-03-21 NOTE — Assessment & Plan Note (Signed)
Overall appears to be doing better.  Follow.  

## 2024-03-21 NOTE — Assessment & Plan Note (Signed)
 Reviewed cardiac CT with pulmonary - The nodule is showing multiple characteristics that suggest an perifissural lymph node. It's quite small, but unfortunately the images stop half way through the nodule (cannot completely visualize it). If she is low risk I would feel comfortable not repeating a CT. I do have a low threshold to getting a dedicated chest CT at the one year mark for any patient (especially elderly) with any type of risk (personal history of smoking, significant second hand smoke exposure, occupational exposures, significant time spent commuting, [employer] work, [employer] representative work, family history, etc...). It would allow for a one year surveillance of the nodule and better visualization of the whole lung. Recommend f/u one year after cardiac scan.

## 2024-03-21 NOTE — Assessment & Plan Note (Signed)
 Low-carb diet and exercise.  Follow met b and A1c.

## 2024-03-21 NOTE — Assessment & Plan Note (Signed)
 Saw Dr End 03/10/24 - recommended exercise tolerance test. Scheduled for next week. Recommended continue aspirin and statin. Check LP(a) with next labs.

## 2024-03-21 NOTE — Assessment & Plan Note (Signed)
 Recent screening - CT - calcium  score revealed calcium  score 516 (93 percentile). (LAD 203 and RCA 313).  Saw cardiology 11/27/23 - recommended to continue rosuvastatin .  Continue risk factor modification. Had f/u with cardiology - Saw Dr End 03/10/24 - recommended exercise tolerance test. Scheduled for next week. Recommended continue aspirin and statin. Check LP(a) with next labs.

## 2024-03-21 NOTE — Assessment & Plan Note (Signed)
 Saw gyn 04/23/22 - PAP - ok.  Recommended repeat in 2025.  Need records.

## 2024-03-21 NOTE — Assessment & Plan Note (Signed)
 Vitamiin D level wnl 05/01/23.

## 2024-03-21 NOTE — Assessment & Plan Note (Signed)
 With elevated calcium  score, continue crestor .

## 2024-03-21 NOTE — Assessment & Plan Note (Signed)
No upper symptoms reported.  Continue aciphex.  

## 2024-03-21 NOTE — Assessment & Plan Note (Signed)
 Followed by dermatology. Continue otezla .

## 2024-03-24 NOTE — Progress Notes (Unsigned)
° °  REFERRING PHYSICIAN:  Glendia Shad, Md [address] [place],  [place] [address]  DOS: 12/22/23  PSF/XLIF L4-L5  HISTORY OF PRESENT ILLNESS:  She was doing well at her last visit with some discomfort below right knee at night. She was advised to take celebrex  and robaxin  at night.   Her right knee pain is better. She is now having pain in left leg tat is only at night. She is on neurontin  100mg  q hs and this is helping.   Overall her pain is much better than it was prior to her surgery.    PHYSICAL EXAMINATION:  General: Patient is well developed, well nourished, calm, collected, and in no apparent distress.   NEUROLOGICAL:  General: In no acute distress.   Awake, alert, oriented to person, place, and time.  Pupils equal round and reactive to light.  Facial tone is symmetric.     Strength:      Side Iliopsoas Quads Hamstring PF DF EHL  R 5 5 5 5 5 5   L 5 5 5 5 5 5    Incisions well healed.    ROS (Neurologic):  Negative except as noted above  IMAGING: Lumbar xrays dated 03/25/24: No complications noted.   Report for above xrays not yet available.   ASSESSMENT/PLAN:  April Santos is doing well s/p above surgery. Treatment options reviewed with patient and following plan made:   - She can slowly return to activity as tolerated.  - Will watch left leg pain and let us  know if it gets worse. For now, neurontin  is helping.  - She will follow up with Dr. Clois in 6 months with repeat xrays.   Advised to contact the office if any questions or concerns arise.  Glade Boys PA-C Department of neurosurgery

## 2024-03-25 ENCOUNTER — Encounter: Payer: Self-pay | Admitting: Orthopedic Surgery

## 2024-03-25 ENCOUNTER — Ambulatory Visit: Admitting: Orthopedic Surgery

## 2024-03-25 ENCOUNTER — Ambulatory Visit (INDEPENDENT_AMBULATORY_CARE_PROVIDER_SITE_OTHER)

## 2024-03-25 VITALS — BP 110/70 | Temp 98.6°F | Wt 123.0 lb

## 2024-03-25 DIAGNOSIS — Z981 Arthrodesis status: Secondary | ICD-10-CM

## 2024-03-25 DIAGNOSIS — Z09 Encounter for follow-up examination after completed treatment for conditions other than malignant neoplasm: Secondary | ICD-10-CM | POA: Diagnosis not present

## 2024-03-25 DIAGNOSIS — M4316 Spondylolisthesis, lumbar region: Secondary | ICD-10-CM

## 2024-03-30 ENCOUNTER — Ambulatory Visit: Admitting: Dermatology

## 2024-04-13 ENCOUNTER — Encounter: Payer: Self-pay | Admitting: Dermatology

## 2024-04-13 ENCOUNTER — Ambulatory Visit: Admitting: Dermatology

## 2024-04-13 DIAGNOSIS — L409 Psoriasis, unspecified: Secondary | ICD-10-CM | POA: Diagnosis not present

## 2024-04-13 DIAGNOSIS — R634 Abnormal weight loss: Secondary | ICD-10-CM | POA: Diagnosis not present

## 2024-04-13 DIAGNOSIS — L820 Inflamed seborrheic keratosis: Secondary | ICD-10-CM | POA: Diagnosis not present

## 2024-04-13 DIAGNOSIS — Z79899 Other long term (current) drug therapy: Secondary | ICD-10-CM

## 2024-04-13 DIAGNOSIS — Z7189 Other specified counseling: Secondary | ICD-10-CM

## 2024-04-13 MED ORDER — VTAMA 1 % EX CREA: TOPICAL_CREAM | CUTANEOUS | 5 refills | Status: AC

## 2024-04-13 NOTE — Patient Instructions (Addendum)
 Cryotherapy Aftercare  Wash gently with soap and water  everyday.   Apply Vaseline and Band-Aid daily until healed.    Your prescription was sent to [facility] in [place]. A representative from [facility] will contact you within 3 business hours to verify your address and insurance information to schedule a free delivery. If for any reason you do not receive a phone call from them, please reach out to them. Their phone number is [phone] and their hours are Monday-Friday 9:00 am-5:00 pm.     Due to recent changes in healthcare laws, you may see results of your pathology and/or laboratory studies on MyChart before the doctors have had a chance to review them. We understand that in some cases there may be results that are confusing or concerning to you. Please understand that not all results are received at the same time and often the doctors may need to interpret multiple results in order to provide you with the best plan of care or course of treatment. Therefore, we ask that you please give us  2 business days to thoroughly review all your results before contacting the office for clarification. Should we see a critical lab result, you will be contacted sooner.   If You Need Anything After Your Visit  If you have any questions or concerns for your doctor, please call our main line at [phone] and press option 4 to reach your doctor's medical assistant. If no one answers, please leave a voicemail as directed and we will return your call as soon as possible. Messages left after 4 pm will be answered the following business day.   You may also send us  a message via MyChart. We typically respond to MyChart messages within 1-2 business days.  For prescription refills, please ask your pharmacy to contact our office. Our fax number is [phone].  If you have an urgent issue when the clinic is closed that cannot wait until the next business day, you can page your doctor at the number  below.    Please note that while we do our best to be available for urgent issues outside of office hours, we are not available 24/7.   If you have an urgent issue and are unable to reach us , you may choose to seek medical care at your doctor's office, retail clinic, urgent care center, or emergency room.  If you have a medical emergency, please immediately call 911 or go to the emergency department.  Pager Numbers  - Dr. Hester: [phone]  - Dr. Jackquline: [phone]  - Dr. Claudene: [phone]   - Dr. Raymund: [phone]  In the event of inclement weather, please call our main line at [phone] for an update on the status of any delays or closures.  Dermatology Medication Tips: Please keep the boxes that topical medications come in in order to help keep track of the instructions about where and how to use these. Pharmacies typically print the medication instructions only on the boxes and not directly on the medication tubes.   If your medication is too expensive, please contact our office at [phone] option 4 or send us  a message through MyChart.   We are unable to tell what your co-pay for medications will be in advance as this is different depending on your insurance coverage. However, we may be able to find a substitute medication at lower cost or fill out paperwork to get insurance to cover a needed medication.   If a prior authorization is required to get your  medication covered by your insurance company, please allow us  1-2 business days to complete this process.  Drug prices often vary depending on where the prescription is filled and some pharmacies may offer cheaper prices.  The website www.goodrx.com contains coupons for medications through different pharmacies. The prices here do not account for what the cost may be with help from insurance (it may be cheaper with your insurance), but the website can give you the price if you did not use any insurance.  - You  can print the associated coupon and take it with your prescription to the pharmacy.  - You may also stop by our office during regular business hours and pick up a GoodRx coupon card.  - If you need your prescription sent electronically to a different pharmacy, notify our office through [facility] or by phone at [phone] option 4.     Si Usted Necesita Algo Despus de Su Visita  Tambin puede enviarnos un mensaje a travs de Clinical Cytogeneticist. Por lo general respondemos a los mensajes de MyChart en el transcurso de 1 a 2 das hbiles.  Para renovar recetas, por favor pida a su farmacia que se ponga en contacto con nuestra oficina. Randi lakes de fax es [place] [phone].  Si tiene un asunto urgente cuando la clnica est cerrada y que no puede esperar hasta el siguiente da hbil, puede llamar/localizar a su doctor(a) al nmero que aparece a continuacin.   Por favor, tenga en cuenta que aunque hacemos todo lo posible para estar disponibles para asuntos urgentes fuera del horario de [place], no estamos disponibles las 24 horas del da, los 7 [address]  [phone] de la [place].   Si tiene un problema urgente y no puede comunicarse con nosotros, puede optar por buscar atencin mdica  en el consultorio de su doctor(a), en una clnica privada, en un centro de atencin urgente o en una sala de emergencias.  Si tiene engineer, drilling, por favor llame inmediatamente al 911 o vaya a la sala de emergencias.  Nmeros de bper  - Dr. Hester: [phone]  - Dra. Jackquline: [phone]  - Dr. Claudene: [phone]  - Dra. Kitts: [phone]  En caso de inclemencias del [place], por favor llame a nuestra lnea principal al [phone] para una actualizacin sobre el estado de cualquier retraso o cierre.  Consejos para la medicacin en dermatologa: Por favor, guarde las cajas en las que vienen los medicamentos de uso tpico para ayudarle a seguir las instrucciones sobre dnde y cmo usarlos. Las  farmacias generalmente imprimen las instrucciones del medicamento slo en las cajas y no directamente en los tubos del [place].   Si su medicamento es muy caro, por favor, pngase en contacto con landry rieger llamando al [phone] y presione la opcin 4 o envenos un mensaje a travs de Clinical Cytogeneticist.   No podemos decirle cul ser su copago por los medicamentos por adelantado ya que esto es diferente dependiendo de la cobertura de su seguro. Sin embargo, es posible que podamos encontrar un medicamento sustituto a audiological scientist un formulario para que el seguro cubra el medicamento que se considera necesario.   Si se requiere una autorizacin previa para que su compaa de seguros cubra su medicamento, por favor permtanos de 1 a 2 das hbiles para completar este proceso.  Los precios de los medicamentos varan con frecuencia dependiendo del environmental consultant de dnde se surte la receta y alguna farmacias pueden ofrecer precios ms baratos.  El sitio web www.goodrx.com tiene cupones para medicamentos  de [employer]. Los precios aqu no tienen en cuenta lo que podra costar con la ayuda del seguro (puede ser ms barato con su seguro), pero el sitio web puede darle el precio si no utiliz tourist information centre manager.  - Puede imprimir el cupn correspondiente y llevarlo con su receta a la farmacia.  - Tambin puede pasar por nuestra oficina durante el horario de atencin regular y education officer, museum una tarjeta de cupones de GoodRx.  - Si necesita que su receta se enve electrnicamente a una farmacia diferente, informe a nuestra oficina a travs de MyChart de [place] o por telfono llamando al [phone] y presione la opcin 4.

## 2024-04-13 NOTE — Progress Notes (Signed)
" ° °  Follow-Up Visit   Subjective  April Santos is a 69 y.o. female who presents for the following: Psoriasis 47m f/u Otezla  30mg  1 po qd, Vtama  alternating with TMC 0.1% cr prn flares, No complaints  nausea, headache, upper respiratory infection, depression, has had soft stools but no diarrhea, has had weight loss ~22lbs, and has had 2 surgeries since 02/2023 last surgery 70m ago, not currently losing weight but feels like she can't gain weight, pt not approved for Otezla  [employer] program, has been taking samples, check spot L post shoulder, noticed last week, was red now brown  The following portions of the chart were reviewed this encounter and updated as appropriate: medications, allergies, medical history  Review of Systems:  No other skin or systemic complaints except as noted in HPI or Assessment and Plan.  Objective  Well appearing patient in no apparent distress; mood and affect are within normal limits.  A focused examination was performed of the following areas: Left shoulder, arms  Relevant exam findings are noted in the Assessment and Plan.  L post shoulder x 1 Stuck on waxy paps with erythema  Assessment & Plan   PSORIASIS Knees, feet Patient has been on Otezla  since before 06/2021 ~33yrs BSA 0% Weight loss seems to have been associated with her postsurgical recovery time (patient states she did not feel like eating after her surgeries) ; but weight loss may be associated with Otezla  Weight today 125.4 lbs Exam: knees and feet clear today  Chronic condition with duration or expected duration over one year. Currently well-controlled.  patient denies joint pain  Psoriasis is a chronic non-curable, but treatable genetic/hereditary disease that may have other systemic features affecting other organ systems such as joints (Psoriatic Arthritis). It is associated with an increased risk of inflammatory bowel disease, heart disease, non-alcoholic fatty liver disease, and  depression.  Treatments include light and laser treatments; topical medications; and systemic medications including oral and injectables.  Treatment Plan: Recommend d/c Otezla  for now due to weight loss Cont Vtama  cr alternating with TMC 0.1% cr prn flares  Topical steroids (such as triamcinolone , fluocinolone, fluocinonide, mometasone, clobetasol, halobetasol, betamethasone , hydrocortisone ) can cause thinning and lightening of the skin if they are used for too long in the same area. Your physician has selected the right strength medicine for your problem and area affected on the body. Please use your medication only as directed by your physician to prevent side effects.   WEIGHT LOSS ~22lbs since 02/2023 Seemed to be exacerbated by surgeries but may be associated with Otezla  Weight today 125.4 lbs Treatment Plan: D/C Otezla  for now  INFLAMED SEBORRHEIC KERATOSIS L post shoulder x 1 Symptomatic, irritating, patient would like treated. - Destruction of lesion - L post shoulder x 1 Complexity: simple   Destruction method: cryotherapy   Informed consent: discussed and consent obtained   Timeout:  patient name, date of birth, surgical site, and procedure verified Lesion destroyed using liquid nitrogen: Yes   Region frozen until ice ball extended beyond lesion: Yes   Outcome: patient tolerated procedure well with no complications   Post-procedure details: wound care instructions given     Return in about 4 months (around 08/11/2024) for Psoriasis f/u.  I, Grayce Saunas, RMA, am acting as scribe for Alm Rhyme, MD .   Documentation: I have reviewed the above documentation for accuracy and completeness, and I agree with the above.  Alm Rhyme, MD    "

## 2024-04-23 NOTE — Patient Instructions (Signed)
 Preventive Care 83 Years and Older, Female Preventive care refers to lifestyle choices and visits with your health care provider that can promote health and wellness. Preventive care visits are also called wellness exams. What can I expect for my preventive care visit? Counseling Your health care provider may ask you questions about your: Medical history, including: Past medical problems. Family medical history. Pregnancy and menstrual history. History of falls. Current health, including: Memory and ability to understand (cognition). Emotional well-being. Home life and relationship well-being. Sexual activity and sexual health. Lifestyle, including: Alcohol, nicotine or tobacco, and drug use. Access to firearms. Diet, exercise, and sleep habits. Work and work Astronomer. Sunscreen use. Safety issues such as seatbelt and bike helmet use. Physical exam Your health care provider will check your: Height and weight. These may be used to calculate your BMI (body mass index). BMI is a measurement that tells if you are at a healthy weight. Waist circumference. This measures the distance around your waistline. This measurement also tells if you are at a healthy weight and may help predict your risk of certain diseases, such as type 2 diabetes and high blood pressure. Heart rate and blood pressure. Body temperature. Skin for abnormal spots. What immunizations do I need?  Vaccines are usually given at various ages, according to a schedule. Your health care provider will recommend vaccines for you based on your age, medical history, and lifestyle or other factors, such as travel or where you work. What tests do I need? Screening Your health care provider may recommend screening tests for certain conditions. This may include: Lipid and cholesterol levels. Hepatitis C test. Hepatitis B test. HIV (human immunodeficiency virus) test. STI (sexually transmitted infection) testing, if you are at  risk. Lung cancer screening. Colorectal cancer screening. Diabetes screening. This is done by checking your blood sugar (glucose) after you have not eaten for a while (fasting). Mammogram. Talk with your health care provider about how often you should have regular mammograms. BRCA-related cancer screening. This may be done if you have a family history of breast, ovarian, tubal, or peritoneal cancers. Bone density scan. This is done to screen for osteoporosis. Talk with your health care provider about your test results, treatment options, and if necessary, the need for more tests. Follow these instructions at home: Eating and drinking  Eat a diet that includes fresh fruits and vegetables, whole grains, lean protein, and low-fat dairy products. Limit your intake of foods with high amounts of sugar, saturated fats, and salt. Take vitamin and mineral supplements as recommended by your health care provider. Do not drink alcohol if your health care provider tells you not to drink. If you drink alcohol: Limit how much you have to 0-1 drink a day. Know how much alcohol is in your drink. In the [place] one drink equals one 12 oz bottle of beer (355 mL), one 5 oz glass of wine (148 mL), or one 1 oz glass of hard liquor (44 mL). Lifestyle Brush your teeth every morning and night with fluoride toothpaste. Floss one time each day. Exercise for at least 30 minutes 5 or more days each week. Do not use any products that contain nicotine or tobacco. These products include cigarettes, chewing tobacco, and vaping devices, such as e-cigarettes. If you need help quitting, ask your health care provider. Do not use drugs. If you are sexually active, practice safe sex. Use a condom or other form of protection in order to prevent STIs. Take aspirin only as told by  your health care provider. Make sure that you understand how much to take and what form to take. Work with your health care provider to find out whether it  is safe and beneficial for you to take aspirin daily. Ask your health care provider if you need to take a cholesterol-lowering medicine (statin). Find healthy ways to manage stress, such as: Meditation, yoga, or listening to music. Journaling. Talking to a trusted person. Spending time with friends and family. Minimize exposure to UV radiation to reduce your risk of skin cancer. Safety Always wear your seat belt while driving or riding in a vehicle. Do not drive: If you have been drinking alcohol. Do not ride with someone who has been drinking. When you are tired or distracted. While texting. If you have been using any mind-altering substances or drugs. Wear a helmet and other protective equipment during sports activities. If you have firearms in your house, make sure you follow all gun safety procedures. What's next? Visit your health care provider once a year for an annual wellness visit. Ask your health care provider how often you should have your eyes and teeth checked. Stay up to date on all vaccines. This information is not intended to replace advice given to you by your health care provider. Make sure you discuss any questions you have with your health care provider. Document Revised: 09/20/2020 Document Reviewed: 09/20/2020 Elsevier Patient Education  2024 [employer].

## 2024-04-26 ENCOUNTER — Ambulatory Visit: Admitting: Obstetrics

## 2024-04-26 ENCOUNTER — Encounter: Payer: Self-pay | Admitting: Obstetrics

## 2024-04-26 VITALS — BP 107/67 | HR 73 | Ht 60.0 in | Wt 126.0 lb

## 2024-04-26 DIAGNOSIS — Z01419 Encounter for gynecological examination (general) (routine) without abnormal findings: Secondary | ICD-10-CM

## 2024-04-26 NOTE — Progress Notes (Signed)
 "  ANNUAL GYNECOLOGICAL EXAM  SUBJECTIVE  HPI  April Santos is a 69 y.o.-year-old G0P0000 who presents for an annual gynecological exam today.  She denies pelvic pain, abnormal vaginal bleeding or discharge, and UTI symptoms. She reports occasional dyspareunia d/t her partner's size. She has been experiencing hot flashes again after decreasing her gabapentin . She has been of HT for several years now but is still using vaginal estrogen.  Medical/Surgical History Past Medical History:  Diagnosis Date   Anxiety    a.) on BZO PRN (alprazolam )   Aortic atherosclerosis    CAD (coronary artery disease)    Cholelithiasis    Colon polyps    COVID-19 2022   DDD (degenerative disc disease), lumbar    Dysphagia    Family history of adverse reaction to anesthesia    a.) PONV in 1st degree relative (mother)   GERD (gastroesophageal reflux disease)    History of uterine fibroid    Hyperlipidemia    Long-term current use of immunomodulator    a.) on DMARD therapy (apremilast ) for psoriatic arthritis   Long-term use of aspirin therapy    Lumbar stenosis    Lung nodule    Osteoarthritis    Psoriatic arthritis (HCC)    a.) Tx'd with DMARD therapy (apremilast )   Varicose vein of leg    Venous insufficiency    Vitamin D  deficiency    Past Surgical History:  Procedure Laterality Date   ABDOMINAL HYSTERECTOMY     ANTERIOR LATERAL LUMBAR FUSION WITH PERCUTANEOUS SCREW 1 LEVEL N/A 12/22/2023   Procedure: ANTERIOR LATERAL LUMBAR FUSION WITH PERCUTANEOUS SCREW 1 LEVEL;  Surgeon: Clois Fret, MD;  Location: [facility] ORS;  Service: Neurosurgery;  Laterality: N/A;  L4-5 LATERAL LUMBAR INTERBODY FUSION AND POSTERIOR SPINAL FUSION   APPLICATION OF INTRAOPERATIVE CT SCAN N/A 12/22/2023   Procedure: APPLICATION OF INTRAOPERATIVE CT SCAN;  Surgeon: Clois Fret, MD;  Location: [facility] ORS;  Service: Neurosurgery;  Laterality: N/A;   BREAST SURGERY  1999   biopsy   carpal  2005   Tunnel right hand    CARPOMETACARPAL (CMC) FUSION OF THUMB Right 11/16/2020   Procedure: Right CARPOMETACARPAL [facility]) ARTHROPLASTY;  Surgeon: Kathlynn Sharper, MD;  Location: [facility] ORS;  Service: Orthopedics;  Laterality: Right;   COLONOSCOPY WITH ESOPHAGOGASTRODUODENOSCOPY (EGD)     COLONOSCOPY WITH PROPOFOL  N/A 06/10/2016   Procedure: COLONOSCOPY WITH PROPOFOL ;  Surgeon: Lamar ONEIDA Holmes, MD;  Location: [facility] ENDOSCOPY;  Service: Endoscopy;  Laterality: N/A;   ESOPHAGOGASTRODUODENOSCOPY (EGD) WITH PROPOFOL  N/A 06/10/2016   Procedure: ESOPHAGOGASTRODUODENOSCOPY (EGD) WITH PROPOFOL ;  Surgeon: Lamar ONEIDA Holmes, MD;  Location: [facility] ENDOSCOPY;  Service: Endoscopy;  Laterality: N/A;   FOOT SURGERY  02/07/1003   right foot benign tumor   REVERSE SHOULDER ARTHROPLASTY Left 03/04/2023   Procedure: REVERSE SHOULDER ARTHROPLASTY;  Surgeon: Edie Norleen PARAS, MD;  Location: [facility] ORS;  Service: Orthopedics;  Laterality: Left;   SACROILIAC JOINT INJECTION  08/17/13 & 04/13/14   Dr. Avanell   SIGMOIDOSCOPY     UTERINE FIBROID SURGERY  1997   VARICOSE VEIN SURGERY      Social History Lives with husband. Feels safe there Work:part time Exercise:yes Substances: no EtOH, tobacco, vape, and recreational drugs  Obstetric History OB History     Gravida  0   Para  0   Term  0   Preterm  0   AB  0   Living  0      SAB  0   IAB  0  Ectopic  0   Multiple  0   Live Births  0            GYN/Menstrual History Patient's last menstrual period was 02/26/2002. Last Pap: 05/06/23. NILM, negative HPV. She has had 3 HPV negative Paps since her colpo in 2021. Colpo was normal.  Contraception:  Prevention Dentist utd Eye examutd Mammogram 02/03/24 Colonoscopy due in 2 years Flu shot/vaccines utd  Current Medications Show/hide medication list[1]      ROS Constitutional: Denied constitutional symptoms, night sweats, recent illness, fatigue, fever, insomnia and weight loss.  Eyes: Denied eye symptoms, eye  pain, photophobia, vision change and visual disturbance.  Ears/Nose/Throat/Neck: Denied ear, nose, throat or neck symptoms, hearing loss, nasal discharge, sinus congestion and sore throat.  Cardiovascular: Denied cardiovascular symptoms, arrhythmia, chest pain/pressure, edema, exercise intolerance, orthopnea and palpitations.  Respiratory: Denied pulmonary symptoms, asthma, pleuritic pain, productive sputum, cough, dyspnea and wheezing.  Gastrointestinal: Denied gastro-esophageal reflux, melena, nausea and vomiting.  Genitourinary: Denied genitourinary symptoms including symptomatic vaginal discharge, pelvic relaxation issues, and urinary complaints.  Musculoskeletal: Denied musculoskeletal symptoms, stiffness, swelling, muscle weakness and myalgia.  Dermatologic: Denied dermatology symptoms, rash and scar.  Neurologic: Denied neurology symptoms, dizziness, headache, neck pain and syncope.  Psychiatric: Denied psychiatric symptoms, anxiety and depression.  Endocrine: +hot flashes    OBJECTIVE  LMP 02/26/2002    Physical examination General NAD, Conversant  HEENT Atraumatic; Op clear with mmm.  Normo-cephalic. Pupils reactive. Anicteric sclerae  Thyroid /Neck Smooth without nodularity or enlargement. Normal ROM.  Neck Supple.  Skin No rashes, lesions or ulceration. Normal palpated skin turgor. No nodularity.  Breasts: No masses or discharge.  Symmetric.  No axillary adenopathy.  Lungs: Clear to auscultation.No rales or wheezes. Normal Respiratory effort, no retractions.  Heart: NSR.  No murmurs or rubs appreciated. No peripheral edema  Abdomen: Soft.  Non-tender.  No masses.  No HSM. No hernia  Extremities: Moves all appropriately.  Normal ROM for age. No lymphadenopathy.  Neuro: Oriented to PPT.  Normal mood. Normal affect.     Pelvic: Uterus and cervix surgically absent    ASSESSMENT  1) Annual exam  PLAN 1) Physical exam as noted. Discussed healthy lifestyle choices and  preventive care. Declines STI testing. Labs done with PCP. Mammogram ordered by PCP 2) Per ASCCP guideline: 5 year f/u after 3 HPV-negative Pap smears and no high-grade lesions. Proceed with shared decision-making regarding Pap smears after that.  Return in one year for annual exam or as needed for concerns.   Eleanor Canny, CNM      [1]  Outpatient Medications Prior to Visit  Medication Sig   acetaminophen  (TYLENOL ) 500 MG tablet Take 2 tablets (1,000 mg total) by mouth every 6 (six) hours as needed.   ALPRAZolam  (XANAX ) 0.25 MG tablet Take 1 tablet (0.25 mg total) by mouth daily as needed.   Apremilast  (OTEZLA ) 30 MG TABS Take 1 tablet (30 mg total) by mouth daily.   aspirin EC 81 MG tablet Take 81 mg by mouth daily. Swallow whole.   B Complex-C (B-COMPLEX WITH VITAMIN C) tablet Take 1 tablet by mouth daily.   Biotin-Vitamin C (HAIR SKIN NAILS GUMMIES PO) Take 2 tablets by mouth daily. Olly   cholecalciferol (VITAMIN D3) 25 MCG (1000 UNIT) tablet Take 1,000 Units by mouth daily.   Crisaborole  (EUCRISA ) 2 % OINT Apply 1 application topically 2 (two) times daily as needed (psoriasis).   Cyanocobalamin (VITAMIN B-12) 3000 MCG SUBL Place 6,000 mcg under the tongue  daily at 2 am. Gummy   desonide  (DESOWEN ) 0.05 % cream Apply 1 Application topically 2 (two) times daily as needed (psoriasis). For 1-2 wks   diazepam (VALIUM) 5 MG tablet Take 5 mg by mouth every 6 (six) hours as needed for anxiety.   diphenhydrAMINE  (BENADRYL ) 25 mg capsule Take 1 capsule (25 mg total) by mouth every 6 (six) hours as needed for itching.   estradiol  (ESTRACE  VAGINAL) 0.1 MG/GM vaginal cream Insert 1 gram vaginally 2 times weekly as maintenance   gabapentin  (NEURONTIN ) 100 MG capsule Take 1 capsule (100 mg total) by mouth at bedtime.   hydrocortisone  cream 1 % Apply 1 Application topically 2 (two) times daily as needed for itching.   Multiple Vitamins-Minerals (MULTIVITAMIN WITH MINERALS) tablet Take 1 tablet by  mouth daily. Olly   polyethylene glycol (MIRALAX  / GLYCOLAX ) 17 g packet Take 17 g by mouth daily as needed for mild constipation.   RABEprazole  (ACIPHEX ) 20 MG tablet Take 1 tablet (20 mg total) by mouth 2 (two) times daily.   rosuvastatin  (CRESTOR ) 5 MG tablet Take 1 tablet (5 mg total) by mouth daily.   senna (SENOKOT) 8.6 MG TABS tablet Take 1 tablet (8.6 mg total) by mouth 2 (two) times daily as needed for mild constipation.   Tapinarof  (VTAMA ) 1 % CREA qd to aa psoriasis prn flares   Wheat Dextrin (BENEFIBER) CHEW Chew 2 tablets by mouth every morning. Gummy   No facility-administered medications prior to visit.   "

## 2024-04-27 ENCOUNTER — Ambulatory Visit: Payer: Self-pay | Admitting: Urgent Care

## 2024-04-27 ENCOUNTER — Encounter
Admission: RE | Disposition: A | Discharge status: Home / Self Care | Payer: Self-pay | Source: Home / Self Care | Attending: Internal Medicine

## 2024-04-27 ENCOUNTER — Ambulatory Visit
Admission: RE | Admit: 2024-04-27 | Discharge: 2024-04-27 | Disposition: A | Discharge status: Home / Self Care | Attending: Internal Medicine | Admitting: Internal Medicine

## 2024-04-27 ENCOUNTER — Encounter: Payer: Self-pay | Admitting: Internal Medicine

## 2024-04-27 ENCOUNTER — Other Ambulatory Visit: Payer: Self-pay

## 2024-04-27 DIAGNOSIS — K219 Gastro-esophageal reflux disease without esophagitis: Secondary | ICD-10-CM | POA: Diagnosis not present

## 2024-04-27 DIAGNOSIS — I2510 Atherosclerotic heart disease of native coronary artery without angina pectoris: Secondary | ICD-10-CM | POA: Diagnosis not present

## 2024-04-27 DIAGNOSIS — K449 Diaphragmatic hernia without obstruction or gangrene: Secondary | ICD-10-CM | POA: Insufficient documentation

## 2024-04-27 DIAGNOSIS — K222 Esophageal obstruction: Secondary | ICD-10-CM | POA: Diagnosis not present

## 2024-04-27 DIAGNOSIS — R1310 Dysphagia, unspecified: Secondary | ICD-10-CM | POA: Insufficient documentation

## 2024-04-27 HISTORY — DX: Long term (current) use of aspirin: Z79.82

## 2024-04-27 HISTORY — DX: Venous insufficiency (chronic) (peripheral): I87.2

## 2024-04-27 HISTORY — DX: Atherosclerotic heart disease of native coronary artery without angina pectoris: I25.10

## 2024-04-27 HISTORY — DX: Long term (current) use of immunomodulator: Z79.61

## 2024-04-27 HISTORY — DX: Anxiety disorder, unspecified: F41.9

## 2024-04-27 HISTORY — PX: MALONEY DILATION: SHX5535

## 2024-04-27 HISTORY — PX: ESOPHAGOGASTRODUODENOSCOPY: SHX5428

## 2024-04-27 HISTORY — DX: Other intervertebral disc degeneration, lumbar region without mention of lumbar back pain or lower extremity pain: M51.369

## 2024-04-27 MED ORDER — SODIUM CHLORIDE 0.9 % IV SOLN: INTRAVENOUS | Status: DC

## 2024-04-27 MED ORDER — PROPOFOL 500 MG/50ML IV EMUL
INTRAVENOUS | Status: DC | PRN
  Administered 2024-04-27: 160 ug/kg/min via INTRAVENOUS

## 2024-04-27 MED ORDER — LIDOCAINE HCL (CARDIAC) PF 100 MG/5ML IV SOSY
PREFILLED_SYRINGE | INTRAVENOUS | Status: DC | PRN
  Administered 2024-04-27: 80 mg via INTRAVENOUS

## 2024-04-27 MED ORDER — PROPOFOL 10 MG/ML IV BOLUS
INTRAVENOUS | Status: DC | PRN
  Administered 2024-04-27: 70 mg via INTRAVENOUS

## 2024-04-27 NOTE — Interval H&P Note (Signed)
 History and Physical Interval Note:  04/27/2024 8:09 AM  April Santos Husband  has presented today for surgery, with the diagnosis of Dysphagia, unspecified type [R13.10].  The various methods of treatment have been discussed with the patient and family. After consideration of risks, benefits and other options for treatment, the patient has consented to  Procedures: EGD (ESOPHAGOGASTRODUODENOSCOPY) (N/A) as a surgical intervention.  The patient's history has been reviewed, patient examined, no change in status, stable for surgery.  I have reviewed the patient's chart and labs.  Questions were answered to the patient's satisfaction.     [place], April Santos

## 2024-04-27 NOTE — Anesthesia Postprocedure Evaluation (Signed)
"   Anesthesia Post Note  Patient: DEIJA BUHRMAN  Procedure(s) Performed: EGD (ESOPHAGOGASTRODUODENOSCOPY) DILATION, ESOPHAGUS, USING MALONEY DILATOR  Patient location during evaluation: PACU Anesthesia Type: General Level of consciousness: awake and alert Pain management: pain level controlled Vital Signs Assessment: post-procedure vital signs reviewed and stable Respiratory status: spontaneous breathing, nonlabored ventilation, respiratory function stable and patient connected to nasal cannula oxygen Cardiovascular status: blood pressure returned to baseline and stable Postop Assessment: no apparent nausea or vomiting Anesthetic complications: no   No notable events documented.   Last Vitals:  Vitals:   04/27/24 0905 04/27/24 0914  BP: 107/72 110/72  Pulse: (!) 57 (!) 48  Resp: 15 15  Temp:    SpO2: 100% 100%    Last Pain:  Vitals:   04/27/24 0914  TempSrc:   PainSc: 0-No pain                 [place] KANDICE Clause      "

## 2024-04-27 NOTE — Anesthesia Preprocedure Evaluation (Signed)
 "                                  Anesthesia Evaluation  Patient identified by MRN, date of birth, ID band Patient awake    Reviewed: Allergy & Precautions, H&P , NPO status , Patient's Chart, lab work & pertinent test results, reviewed documented beta blocker date and time   Airway Mallampati: II   Neck ROM: full    Dental  (+) Poor Dentition   Pulmonary neg pulmonary ROS   Pulmonary exam normal        Cardiovascular Exercise Tolerance: Good + CAD  Normal cardiovascular exam Rhythm:regular Rate:Normal     Neuro/Psych   Anxiety      Neuromuscular disease  negative psych ROS   GI/Hepatic Neg liver ROS,GERD  Medicated,,  Endo/Other  negative endocrine ROS    Renal/GU negative Renal ROS  negative genitourinary   Musculoskeletal   Abdominal   Peds  Hematology negative hematology ROS (+)   Anesthesia Other Findings Past Medical History: No date: Anxiety     Comment:  a.) on BZO PRN (alprazolam ) No date: Aortic atherosclerosis No date: CAD (coronary artery disease) No date: Cholelithiasis No date: Colon polyps 2022: COVID-19 No date: DDD (degenerative disc disease), lumbar No date: Dysphagia No date: Family history of adverse reaction to anesthesia     Comment:  a.) PONV in 1st degree relative (mother) No date: GERD (gastroesophageal reflux disease) No date: History of uterine fibroid No date: Hyperlipidemia No date: Long-term current use of immunomodulator     Comment:  a.) on DMARD therapy (apremilast ) for psoriatic               arthritis No date: Long-term use of aspirin therapy No date: Lumbar stenosis No date: Lung nodule No date: Osteoarthritis No date: Psoriatic arthritis (HCC)     Comment:  a.) Tx'd with DMARD therapy (apremilast ) No date: Varicose vein of leg No date: Venous insufficiency No date: Vitamin D  deficiency Past Surgical History: No date: ABDOMINAL HYSTERECTOMY 12/22/2023: ANTERIOR LATERAL LUMBAR FUSION WITH  PERCUTANEOUS SCREW 1  LEVEL; N/A     Comment:  Procedure: ANTERIOR LATERAL LUMBAR FUSION WITH               PERCUTANEOUS SCREW 1 LEVEL;  Surgeon: Clois Fret,              MD;  Location: [facility] ORS;  Service: Neurosurgery;                Laterality: N/A;  L4-5 LATERAL LUMBAR INTERBODY FUSION               AND POSTERIOR SPINAL FUSION 12/22/2023: APPLICATION OF INTRAOPERATIVE CT SCAN; N/A     Comment:  Procedure: APPLICATION OF INTRAOPERATIVE CT SCAN;                Surgeon: Clois Fret, MD;  Location: [facility] ORS;                Service: Neurosurgery;  Laterality: N/A; 1999: BREAST SURGERY     Comment:  biopsy 2005: carpal     Comment:  Tunnel right hand 11/16/2020: CARPOMETACARPAL (CMC) FUSION OF THUMB; Right     Comment:  Procedure: Right CARPOMETACARPAL [facility]) ARTHROPLASTY;                Surgeon: Kathlynn Sharper, MD;  Location: [facility] ORS;  Service: Orthopedics;  Laterality: Right; No date: COLONOSCOPY WITH ESOPHAGOGASTRODUODENOSCOPY (EGD) 06/10/2016: COLONOSCOPY WITH PROPOFOL ; N/A     Comment:  Procedure: COLONOSCOPY WITH PROPOFOL ;  Surgeon: Lamar ONEIDA Holmes, MD;  Location: [facility] ENDOSCOPY;  Service:               Endoscopy;  Laterality: N/A; 06/10/2016: ESOPHAGOGASTRODUODENOSCOPY (EGD) WITH PROPOFOL ; N/A     Comment:  Procedure: ESOPHAGOGASTRODUODENOSCOPY (EGD) WITH               PROPOFOL ;  Surgeon: Lamar ONEIDA Holmes, MD;  Location: [facility]              ENDOSCOPY;  Service: Endoscopy;  Laterality: N/A; 02/07/1003: FOOT SURGERY     Comment:  right foot benign tumor 03/04/2023: REVERSE SHOULDER ARTHROPLASTY; Left     Comment:  Procedure: REVERSE SHOULDER ARTHROPLASTY;  Surgeon:               Edie Norleen PARAS, MD;  Location: [facility] ORS;  Service:               Orthopedics;  Laterality: Left; 08/17/13 & 04/13/14: SACROILIAC JOINT INJECTION     Comment:  Dr. Avanell No date: SIGMOIDOSCOPY 1997: UTERINE FIBROID SURGERY No date: VARICOSE VEIN SURGERY    Reproductive/Obstetrics negative OB ROS                              Anesthesia Physical Anesthesia Plan  ASA: 3  Anesthesia Plan: General   Post-op Pain Management:    Induction:   PONV Risk Score and Plan:   Airway Management Planned:   Additional Equipment:   Intra-op Plan:   Post-operative Plan:   Informed Consent: I have reviewed the patients History and Physical, chart, labs and discussed the procedure including the risks, benefits and alternatives for the proposed anesthesia with the patient or authorized representative who has indicated his/her understanding and acceptance.     Dental Advisory Given  Plan Discussed with: CRNA  Anesthesia Plan Comments:         Anesthesia Quick Evaluation  "

## 2024-04-27 NOTE — Transfer of Care (Signed)
 Immediate Anesthesia Transfer of Care Note  Patient: April Santos  Procedure(s) Performed: EGD (ESOPHAGOGASTRODUODENOSCOPY) DILATION, ESOPHAGUS, USING MALONEY DILATOR  Patient Location: Endoscopy Unit  Anesthesia Type:General  Level of Consciousness: awake  Airway & Oxygen Therapy: Patient Spontanous Breathing  Post-op Assessment: Report given to RN and Post -op Vital signs reviewed and stable  Post vital signs: Reviewed and stable  Last Vitals:  Vitals Value Taken Time  BP 110/66 04/27/24 08:55  Temp 36.7 C 04/27/24 08:55  Pulse 58 04/27/24 08:55  Resp 13 04/27/24 08:55  SpO2 99 % 04/27/24 08:55    Last Pain:  Vitals:   04/27/24 0855  TempSrc: Tympanic  PainSc: 0-No pain         Complications: No notable events documented.

## 2024-04-27 NOTE — Op Note (Signed)
 [facility] Gastroenterology Patient Name: April Santos Procedure Date: 04/27/2024 8:34 AM MRN: [id number] Account #: [id number] Date of Birth: 1955-09-30 Admit Type: Outpatient Age: 69 Room: [facility] ENDO ROOM 2 Gender: Female Note Status: Finalized Instrument Name: Upper GI Scope [id number] Procedure:             Upper GI endoscopy Indications:           Dysphagia Providers:             Timmy Bubeck K. Aundria MD, MD Referring MD:          Allena Hamilton, MD (Referring MD) Medicines:             Propofol  per Anesthesia Complications:         No immediate complications. Estimated blood loss: None. Procedure:             Pre-Anesthesia Assessment:                        - The risks and benefits of the procedure and the                         sedation options and risks were discussed with the                         patient. All questions were answered and informed                         consent was obtained.                        - Patient identification and proposed procedure were                         verified prior to the procedure by the nurse. The                         procedure was verified in the procedure room.                        - ASA Grade Assessment: III - A patient with severe                         systemic disease.                        - After reviewing the risks and benefits, the patient                         was deemed in satisfactory condition to undergo the                         procedure.                        After obtaining informed consent, the endoscope was                         passed under direct vision. Throughout the procedure,  the patient's blood pressure, pulse, and oxygen                         saturations were monitored continuously. The Endoscope                         was introduced through the mouth, and advanced to the                         third part of duodenum. The upper GI endoscopy was                          accomplished without difficulty. The patient tolerated                         the procedure well. Findings:      The examined duodenum was normal.      A 3 cm hiatal hernia was present.      No other significant abnormalities were identified in a careful       examination of the stomach.      A non-obstructing Schatzki ring was found in the distal esophagus. The       scope was withdrawn. Dilation was performed with a Maloney dilator with       no resistance at 54 Fr. Estimated blood loss: none.      The exam of the esophagus was otherwise normal. Impression:            - Normal examined duodenum.                        - 3 cm hiatal hernia.                        - Non-obstructing Schatzki ring. Dilated.                        - No specimens collected. Recommendation:        - Patient has a contact number available for                         emergencies. The signs and symptoms of potential                         delayed complications were discussed with the patient.                         Return to normal activities tomorrow. Written                         discharge instructions were provided to the patient.                        - Resume previous diet.                        - Continue present medications.                        - Monitor results to esophageal dilation                        -  Follow up with Romero Antigua, PA-C at [facility]. [phone].                        - Telephone GI office to schedule appointment at                         appointment to be scheduled.                        - The findings and recommendations were discussed with                         the patient. Procedure Code(s):     --- Professional ---                        [phone], Esophagogastroduodenoscopy, flexible,                         transoral; diagnostic, including collection of                          specimen(s) by brushing or washing, when performed                         (separate procedure)                        43450, Dilation of esophagus, by unguided sound or                         bougie, single or multiple passes Diagnosis Code(s):     --- Professional ---                        R13.10, Dysphagia, unspecified                        K22.2, Esophageal obstruction                        K44.9, Diaphragmatic hernia without obstruction or                         gangrene CPT copyright 2022 [employer]. All rights reserved. The codes documented in this report are preliminary and upon coder review may  be revised to meet current compliance requirements. Ladell MARLA Boss MD, MD 04/27/2024 8:51:01 AM This report has been signed electronically. Number of Addenda: 0 Note Initiated On: 04/27/2024 8:34 AM Estimated Blood Loss:  Estimated blood loss: none.      [facility]

## 2024-04-27 NOTE — H&P (Signed)
 Outpatient short stay form Pre-procedure 04/27/2024 8:07 AM Jshon Ibe K. Aundria, M.D.  Primary Physician: Allena Hamilton, M.D.  Reason for visit:  Dysphagia  History of present illness:  April Santos reports continued to have some on issues with dysphagia, she endorses that dysphagia occurs most to foods such as chicken or breads, will stop in her upper esophageal area. She has increased her Aciphex  to 20 mg twice a day and is not feeling any reflux or indigestion symptoms. She denies any nausea vomiting. No epigastric pain.  She endorses having some issues with constipation, she is having significant joint pain and is seeing the pain clinic. She has had to begin using narcotics as needed which have worsened her constipation. She generally takes Dulcolax as needed which can cause some issues with loose liquid stools. She denies abdominal pain but does get some bloating and may be worse with constipation. Also has a lot of gas.  Drink some tea throughout the day. Tries to avoid sodas. Does chew gum is helps her mouth which gets dry from narcotics.  Denies alcohol tobacco. Very rare NSAIDs.   SABRA Dysphagia-last EGD 2018 with dilation which improved symptoms  Current Medications[1]  Medications Prior to Admission  Medication Sig Dispense Refill Last Dose/Taking   diazepam (VALIUM) 5 MG tablet Take 5 mg by mouth every 6 (six) hours as needed for anxiety.   Taking As Needed   acetaminophen  (TYLENOL ) 500 MG tablet Take 2 tablets (1,000 mg total) by mouth every 6 (six) hours as needed.      ALPRAZolam  (XANAX ) 0.25 MG tablet Take 1 tablet (0.25 mg total) by mouth daily as needed. 15 tablet 0    Apremilast  (OTEZLA ) 30 MG TABS Take 1 tablet (30 mg total) by mouth daily. 30 tablet 5    aspirin EC 81 MG tablet Take 81 mg by mouth daily. Swallow whole.      B Complex-C (B-COMPLEX WITH VITAMIN C) tablet Take 1 tablet by mouth daily.      Biotin-Vitamin C (HAIR SKIN NAILS GUMMIES PO) Take 2 tablets by mouth  daily. Olly      cholecalciferol (VITAMIN D3) 25 MCG (1000 UNIT) tablet Take 1,000 Units by mouth daily.      Crisaborole  (EUCRISA ) 2 % OINT Apply 1 application topically 2 (two) times daily as needed (psoriasis). 60 g 1    Cyanocobalamin (VITAMIN B-12) 3000 MCG SUBL Place 6,000 mcg under the tongue daily at 2 am. Gummy      desonide  (DESOWEN ) 0.05 % cream Apply 1 Application topically 2 (two) times daily as needed (psoriasis). For 1-2 wks 15 g 0    diphenhydrAMINE  (BENADRYL ) 25 mg capsule Take 1 capsule (25 mg total) by mouth every 6 (six) hours as needed for itching.      estradiol  (ESTRACE  VAGINAL) 0.1 MG/GM vaginal cream Insert 1 gram vaginally 2 times weekly as maintenance 42.5 g 2    gabapentin  (NEURONTIN ) 100 MG capsule Take 1 capsule (100 mg total) by mouth at bedtime. 30 capsule 2    hydrocortisone  cream 1 % Apply 1 Application topically 2 (two) times daily as needed for itching.      Multiple Vitamins-Minerals (MULTIVITAMIN WITH MINERALS) tablet Take 1 tablet by mouth daily. Olly      polyethylene glycol (MIRALAX  / GLYCOLAX ) 17 g packet Take 17 g by mouth daily as needed for mild constipation.      RABEprazole  (ACIPHEX ) 20 MG tablet Take 1 tablet (20 mg total) by mouth 2 (two) times  daily. 180 tablet 1    rosuvastatin  (CRESTOR ) 5 MG tablet Take 1 tablet (5 mg total) by mouth daily. 90 tablet 1    senna (SENOKOT) 8.6 MG TABS tablet Take 1 tablet (8.6 mg total) by mouth 2 (two) times daily as needed for mild constipation. 30 tablet 0    Tapinarof  (VTAMA ) 1 % CREA qd to aa psoriasis prn flares 100 g 5    Wheat Dextrin (BENEFIBER) CHEW Chew 2 tablets by mouth every morning. Gummy        Allergies[2]   Past Medical History:  Diagnosis Date   Anxiety    a.) on BZO PRN (alprazolam )   Aortic atherosclerosis    CAD (coronary artery disease)    Cholelithiasis    Colon polyps    COVID-19 2022   DDD (degenerative disc disease), lumbar    Dysphagia    Family history of adverse reaction  to anesthesia    a.) PONV in 1st degree relative (mother)   GERD (gastroesophageal reflux disease)    History of uterine fibroid    Hyperlipidemia    Long-term current use of immunomodulator    a.) on DMARD therapy (apremilast ) for psoriatic arthritis   Long-term use of aspirin therapy    Lumbar stenosis    Lung nodule    Osteoarthritis    Psoriatic arthritis (HCC)    a.) Tx'd with DMARD therapy (apremilast )   Varicose vein of leg    Venous insufficiency    Vitamin D  deficiency     Review of systems:  Otherwise negative.    Physical Exam  Gen: Alert, oriented. Appears stated age.  HEENT: [place]/AT. PERRLA. Lungs: CTA, no wheezes. CV: RR nl S1, S2. Abd: soft, benign, no masses. BS+ Ext: No edema. Pulses 2+    Planned procedures: Proceed with EGD. The patient understands the nature of the planned procedure, indications, risks, alternatives and potential complications including but not limited to bleeding, infection, perforation, damage to internal organs and possible oversedation/side effects from anesthesia. The patient agrees and gives consent to proceed.  Please refer to procedure notes for findings, recommendations and patient disposition/instructions.     April Santos K. Aundria, M.D. Gastroenterology 04/27/2024  8:07 AM          [1] No current facility-administered medications for this encounter. [2]  Allergies Allergen Reactions   Cefdinir Hives and Itching    Has tolerated 1st generation cephalosporin (CEFAZOLIN ) with no documented ADRs   Erythromycin Ethylsuccinate [Erythromycin] Other (See Comments)    Unknown

## 2024-05-06 ENCOUNTER — Ambulatory Visit: Payer: Self-pay | Admitting: Internal Medicine

## 2024-05-06 ENCOUNTER — Other Ambulatory Visit (INDEPENDENT_AMBULATORY_CARE_PROVIDER_SITE_OTHER)

## 2024-05-06 DIAGNOSIS — I2510 Atherosclerotic heart disease of native coronary artery without angina pectoris: Secondary | ICD-10-CM | POA: Diagnosis not present

## 2024-05-06 DIAGNOSIS — Z13220 Encounter for screening for lipoid disorders: Secondary | ICD-10-CM | POA: Diagnosis not present

## 2024-05-06 DIAGNOSIS — R739 Hyperglycemia, unspecified: Secondary | ICD-10-CM | POA: Diagnosis not present

## 2024-05-06 DIAGNOSIS — L4050 Arthropathic psoriasis, unspecified: Secondary | ICD-10-CM

## 2024-05-06 DIAGNOSIS — Z79899 Other long term (current) drug therapy: Secondary | ICD-10-CM

## 2024-05-06 LAB — LIPID PANEL
Cholesterol: 160 mg/dL (ref 28–200)
HDL: 65 mg/dL
LDL Cholesterol: 80 mg/dL (ref 10–99)
NonHDL: 94.5
Total CHOL/HDL Ratio: 2
Triglycerides: 74 mg/dL (ref 10.0–149.0)
VLDL: 14.8 mg/dL (ref 0.0–40.0)

## 2024-05-06 LAB — CBC WITH DIFFERENTIAL/PLATELET
Basophils Absolute: 0 10*3/uL (ref 0.0–0.1)
Basophils Relative: 0.3 % (ref 0.0–3.0)
Eosinophils Absolute: 0.1 10*3/uL (ref 0.0–0.7)
Eosinophils Relative: 1.7 % (ref 0.0–5.0)
HCT: 40.1 % (ref 36.0–46.0)
Hemoglobin: 13.2 g/dL (ref 12.0–15.0)
Lymphocytes Relative: 36 % (ref 12.0–46.0)
Lymphs Abs: 1.4 10*3/uL (ref 0.7–4.0)
MCHC: 32.9 g/dL (ref 30.0–36.0)
MCV: 85.5 fl (ref 78.0–100.0)
Monocytes Absolute: 0.4 10*3/uL (ref 0.1–1.0)
Monocytes Relative: 9.3 % (ref 3.0–12.0)
Neutro Abs: 2 10*3/uL (ref 1.4–7.7)
Neutrophils Relative %: 52.7 % (ref 43.0–77.0)
Platelets: 257 10*3/uL (ref 150.0–400.0)
RBC: 4.7 Mil/uL (ref 3.87–5.11)
RDW: 13.8 % (ref 11.5–15.5)
WBC: 3.8 10*3/uL — ABNORMAL LOW (ref 4.0–10.5)

## 2024-05-06 LAB — BASIC METABOLIC PANEL WITH GFR
BUN: 12 mg/dL (ref 6–23)
CO2: 34 meq/L — ABNORMAL HIGH (ref 19–32)
Calcium: 9.7 mg/dL (ref 8.4–10.5)
Chloride: 104 meq/L (ref 96–112)
Creatinine, Ser: 0.69 mg/dL (ref 0.40–1.20)
GFR: 88.24 mL/min
Glucose, Bld: 96 mg/dL (ref 70–99)
Potassium: 4.3 meq/L (ref 3.5–5.1)
Sodium: 142 meq/L (ref 135–145)

## 2024-05-06 LAB — HEPATIC FUNCTION PANEL
ALT: 12 U/L (ref 3–35)
AST: 17 U/L (ref 5–37)
Albumin: 4.3 g/dL (ref 3.5–5.2)
Alkaline Phosphatase: 100 U/L (ref 39–117)
Bilirubin, Direct: 0.2 mg/dL (ref 0.1–0.3)
Total Bilirubin: 0.7 mg/dL (ref 0.2–1.2)
Total Protein: 6.7 g/dL (ref 6.0–8.3)

## 2024-05-06 LAB — HEMOGLOBIN A1C: Hgb A1c MFr Bld: 5.9 % (ref 4.6–6.5)

## 2024-05-10 ENCOUNTER — Ambulatory Visit: Admitting: Internal Medicine

## 2024-05-12 LAB — LIPOPROTEIN A (LPA): Lipoprotein (a): 176 nmol/L — ABNORMAL HIGH

## 2024-05-13 ENCOUNTER — Ambulatory Visit: Admitting: Internal Medicine

## 2024-05-13 ENCOUNTER — Encounter: Payer: Self-pay | Admitting: Internal Medicine

## 2024-05-13 DIAGNOSIS — L4050 Arthropathic psoriasis, unspecified: Secondary | ICD-10-CM

## 2024-05-13 DIAGNOSIS — R739 Hyperglycemia, unspecified: Secondary | ICD-10-CM

## 2024-05-13 DIAGNOSIS — Z13220 Encounter for screening for lipoid disorders: Secondary | ICD-10-CM

## 2024-05-13 MED ORDER — GABAPENTIN 100 MG PO CAPS: 100.0000 mg | ORAL_CAPSULE | Freq: Two times a day (BID) | ORAL | 2 refills | Status: AC

## 2024-05-13 MED ORDER — ROSUVASTATIN CALCIUM 10 MG PO TABS: 10.0000 mg | ORAL_TABLET | Freq: Every day | ORAL | 3 refills | Status: AC

## 2024-05-13 MED ORDER — RABEPRAZOLE SODIUM 20 MG PO TBEC: 20.0000 mg | DELAYED_RELEASE_TABLET | Freq: Two times a day (BID) | ORAL | 1 refills | Status: AC

## 2024-05-13 NOTE — Progress Notes (Unsigned)
 "  Subjective:    Patient ID: CHRISTON GALLAWAY, female    DOB: December 14, 1955, 69 y.o.   MRN: [id number]  Patient here for  Chief Complaint  Patient presents with   Medical Management of Chronic Issues    Follow up    HPI Here for a scheduled follow up.  Is s/p L4-L5 >LIF and PSF surgical procedure performed by Dr Clois (12/22/23). Pain has improved. Had f/u 02/03/24 - Dr Clois. Recommended celebrex  q hs and methocarbamol  q hs. F/u 03/25/24 - doing well. No change. Monitoring left leg pain. Continue gabapentin . Saw GI 10/02/23 - dysphagia. Recommended EGD. EGD 04/27/24 - 3 cm hiatal hernia, non obstructing schatzki ring. Dilated. GERD controlled on aciphex  20mg  bid. F/u constipation as well. Recommended mirilax. UTD on colonoscopy. Continues on otezla . Had f/u with Dr Edie - 03/08/24- stable. Saw Dr End 03/10/24 - recommended exercise tolerance test. ETT - normal without evidence of a significant narrowing/blockage. Recommended continue aspirin and statin. Saw gyn 04/26/24 - annual exam.    Past Medical History:  Diagnosis Date   Anxiety    a.) on BZO PRN (alprazolam )   Aortic atherosclerosis    CAD (coronary artery disease)    Cholelithiasis    Colon polyps    COVID-19 2022   DDD (degenerative disc disease), lumbar    Dysphagia    Family history of adverse reaction to anesthesia    a.) PONV in 1st degree relative (mother)   GERD (gastroesophageal reflux disease)    History of uterine fibroid    Hyperlipidemia    Long-term current use of immunomodulator    a.) on DMARD therapy (apremilast ) for psoriatic arthritis   Long-term use of aspirin therapy    Lumbar stenosis    Lung nodule    Osteoarthritis    Psoriatic arthritis (HCC)    a.) Tx'd with DMARD therapy (apremilast )   Varicose vein of leg    Venous insufficiency    Vitamin D  deficiency    Past Surgical History:  Procedure Laterality Date   ABDOMINAL HYSTERECTOMY     ANTERIOR LATERAL LUMBAR FUSION  WITH PERCUTANEOUS SCREW 1 LEVEL N/A 12/22/2023   Procedure: ANTERIOR LATERAL LUMBAR FUSION WITH PERCUTANEOUS SCREW 1 LEVEL;  Surgeon: Clois Fret, MD;  Location: [facility] ORS;  Service: Neurosurgery;  Laterality: N/A;  L4-5 LATERAL LUMBAR INTERBODY FUSION AND POSTERIOR SPINAL FUSION   APPLICATION OF INTRAOPERATIVE CT SCAN N/A 12/22/2023   Procedure: APPLICATION OF INTRAOPERATIVE CT SCAN;  Surgeon: Clois Fret, MD;  Location: [facility] ORS;  Service: Neurosurgery;  Laterality: N/A;   BREAST SURGERY  1999   biopsy   carpal  2005   Tunnel right hand   CARPOMETACARPAL (CMC) FUSION OF THUMB Right 11/16/2020   Procedure: Right CARPOMETACARPAL [facility] ARTHROPLASTY;  Surgeon: Kathlynn Sharper, MD;  Location: [facility] ORS;  Service: Orthopedics;  Laterality: Right;   COLONOSCOPY WITH ESOPHAGOGASTRODUODENOSCOPY (EGD)     COLONOSCOPY WITH PROPOFOL  N/A 06/10/2016   Procedure: COLONOSCOPY WITH PROPOFOL ;  Surgeon: Lamar ONEIDA Holmes, MD;  Location: [facility] ENDOSCOPY;  Service: Endoscopy;  Laterality: N/A;   ESOPHAGOGASTRODUODENOSCOPY N/A 04/27/2024   Procedure: EGD (ESOPHAGOGASTRODUODENOSCOPY);  Surgeon: Toledo, Ladell POUR, MD;  Location: [facility] ENDOSCOPY;  Service: Gastroenterology;  Laterality: N/A;   ESOPHAGOGASTRODUODENOSCOPY (EGD) WITH PROPOFOL  N/A 06/10/2016   Procedure: ESOPHAGOGASTRODUODENOSCOPY (EGD) WITH PROPOFOL ;  Surgeon: Lamar ONEIDA Holmes, MD;  Location[facility]COPY;  Service: Endoscopy;  Laterality: N/A;   FOOT SURGERY  02/07/1003   right foot benign tumor   MALONEY DILATION  04/27/2024  Procedure: DILATION, ESOPHAGUS, USING MALONEY DILATOR;  Surgeon: Toledo, Ladell POUR, MD;  Location: [facility] ENDOSCOPY;  Service: Gastroenterology;;   REVERSE SHOULDER ARTHROPLASTY Left 03/04/2023   Procedure: REVERSE SHOULDER ARTHROPLASTY;  Surgeon: Edie Norleen PARAS, MD;  Location: [facility] ORS;  Service: Orthopedics;  Laterality: Left;   SACROILIAC JOINT INJECTION  08/17/13 & 04/13/14   Dr. Avanell   SIGMOIDOSCOPY      UTERINE FIBROID SURGERY  1997   VARICOSE VEIN SURGERY     Family History  Problem Relation Age of Onset   Breast cancer Mother        7/74   Heart attack Father    Arthritis Father    GER disease Father    Coronary artery disease Father        53   Diabetes Brother    Diabetes Maternal Uncle    Colon cancer Neg Hx    Social History   Socioeconomic History   Marital status: Significant Other    Spouse name: Vinie Molt   Number of children: 0   Years of education: Not on file   Highest education level: Not on file  Occupational History   Not on file  Tobacco Use   Smoking status: Never   Smokeless tobacco: Never  Vaping Use   Vaping status: Never Used  Substance and Sexual Activity   Alcohol use: Not Currently    Comment: rare   Drug use: No   Sexual activity: Yes    Birth control/protection: Surgical    Comment: Hysterectomy  Other Topics Concern   Not on file  Social History Narrative   Vinie Molt - POA   Social Drivers of Health   Tobacco Use: Low Risk (05/13/2024)   Patient History    Smoking Tobacco Use: Never    Smokeless Tobacco Use: Never    Passive Exposure: Not on file  Financial Resource Strain: Low Risk (01/27/2024)   Overall Financial Resource Strain (CARDIA)    Difficulty of Paying Living Expenses: Not very hard  Food Insecurity: No Food Insecurity (01/27/2024)   Epic    Worried About Radiation Protection Practitioner of Food in the Last Year: Never true    Ran Out of Food in the Last Year: Never true  Transportation Needs: No Transportation Needs (01/27/2024)   Epic    Lack of Transportation (Medical): No    Lack of Transportation (Non-Medical): No  Physical Activity: Sufficiently Active (01/27/2024)   Exercise Vital Sign    Days of Exercise per Week: 5 days    Minutes of Exercise per Session: 60 min  Stress: No Stress Concern Present (01/27/2024)   [employer] of Occupational Health - Occupational Stress Questionnaire     Feeling of Stress: Not at all  Social Connections: Moderately Integrated (01/27/2024)   Social Connection and Isolation Panel    Frequency of Communication with Friends and Family: Twice a week    Frequency of Social Gatherings with Friends and Family: Twice a week    Attends Religious Services: More than 4 times per year    Active Member of Clubs or Organizations: No    Attends Banker Meetings: Never    Marital Status: Living with partner  Depression (PHQ2-9): Low Risk (05/13/2024)   Depression (PHQ2-9)    PHQ-2 Score: 0  Alcohol Screen: Low Risk (01/22/2023)   Alcohol Screen    Last Alcohol Screening Score (AUDIT): 1  Housing: Low Risk (01/27/2024)   Epic    Unable to Pay for Housing in  the Last Year: No    Number of Times Moved in the Last Year: 0    Homeless in the Last Year: No  Utilities: Not At Risk (01/27/2024)   Epic    Threatened with loss of utilities: No  Health Literacy: Adequate Health Literacy (01/27/2024)   B1300 Health Literacy    Frequency of need for help with medical instructions: Never     Review of Systems     Objective:     BP 110/62 (BP Location: Left Arm)   Pulse (!) 56   Temp 97.6 F (36.4 C)   Ht 5' (1.524 m)   Wt 124 lb 9.6 oz (56.5 kg)   LMP 02/26/2002   SpO2 98%   BMI 24.33 kg/m  Wt Readings from Last 3 Encounters:  05/13/24 124 lb 9.6 oz (56.5 kg)  04/27/24 127 lb (57.6 kg)  04/26/24 126 lb (57.2 kg)    Physical Exam  {Perform Simple Foot Exam  Perform Detailed exam:1} {Insert foot Exam (Optional):30965}   Outpatient Encounter Medications as of 05/13/2024  Medication Sig   acetaminophen  (TYLENOL ) 500 MG tablet Take 2 tablets (1,000 mg total) by mouth every 6 (six) hours as needed.   ALPRAZolam  (XANAX ) 0.25 MG tablet Take 1 tablet (0.25 mg total) by mouth daily as needed.   aspirin EC 81 MG tablet Take 81 mg by mouth daily. Swallow whole.   B Complex-C (B-COMPLEX WITH VITAMIN C) tablet Take 1  tablet by mouth daily.   Biotin-Vitamin C (HAIR SKIN NAILS GUMMIES PO) Take 2 tablets by mouth daily. Olly   cholecalciferol (VITAMIN D3) 25 MCG (1000 UNIT) tablet Take 1,000 Units by mouth daily.   Crisaborole  (EUCRISA ) 2 % OINT Apply 1 application topically 2 (two) times daily as needed (psoriasis).   Cyanocobalamin (VITAMIN B-12) 3000 MCG SUBL Place 6,000 mcg under the tongue daily at 2 am. Gummy   desonide  (DESOWEN ) 0.05 % cream Apply 1 Application topically 2 (two) times daily as needed (psoriasis). For 1-2 wks   diazepam (VALIUM) 5 MG tablet Take 5 mg by mouth every 6 (six) hours as needed for anxiety.   diphenhydrAMINE  (BENADRYL ) 25 mg capsule Take 1 capsule (25 mg total) by mouth every 6 (six) hours as needed for itching.   estradiol  (ESTRACE  VAGINAL) 0.1 MG/GM vaginal cream Insert 1 gram vaginally 2 times weekly as maintenance   hydrocortisone  cream 1 % Apply 1 Application topically 2 (two) times daily as needed for itching.   Multiple Vitamins-Minerals (MULTIVITAMIN WITH MINERALS) tablet Take 1 tablet by mouth daily. Olly   polyethylene glycol (MIRALAX  / GLYCOLAX ) 17 g packet Take 17 g by mouth daily as needed for mild constipation.   rosuvastatin  (CRESTOR ) 10 MG tablet Take 1 tablet (10 mg total) by mouth daily.   senna (SENOKOT) 8.6 MG TABS tablet Take 1 tablet (8.6 mg total) by mouth 2 (two) times daily as needed for mild constipation.   Tapinarof  (VTAMA ) 1 % CREA qd to aa psoriasis prn flares   Wheat Dextrin (BENEFIBER) CHEW Chew 2 tablets by mouth every morning. Gummy   Apremilast  (OTEZLA ) 30 MG TABS Take 1 tablet (30 mg total) by mouth daily. (Patient not taking: Reported on 05/13/2024)   gabapentin  (NEURONTIN ) 100 MG capsule Take 1 capsule (100 mg total) by mouth at bedtime.   RABEprazole  (ACIPHEX ) 20 MG tablet Take 1 tablet (20 mg total) by mouth 2 (two) times daily.   [DISCONTINUED] rosuvastatin  (CRESTOR ) 5 MG tablet Take 1 tablet (5 mg total)  by mouth daily.    No facility-administered encounter medications on file as of 05/13/2024.     Lab Results  Component Value Date   WBC 3.8 (L) 05/06/2024   HGB 13.2 05/06/2024   HCT 40.1 05/06/2024   PLT 257.0 05/06/2024   GLUCOSE 96 05/06/2024   CHOL 160 05/06/2024   TRIG 74.0 05/06/2024   HDL 65.00 05/06/2024   LDLCALC 80 05/06/2024   ALT 12 05/06/2024   AST 17 05/06/2024   NA 142 05/06/2024   K 4.3 05/06/2024   CL 104 05/06/2024   CREATININE 0.69 05/06/2024   BUN 12 05/06/2024   CO2 34 (H) 05/06/2024   TSH 1.08 05/01/2023   HGBA1C 5.9 05/06/2024    No results found.     Assessment & Plan:  Screening cholesterol level  Psoriatic arthritis (HCC)  Hyperglycemia     Allena Hamilton, MD "

## 2024-05-14 ENCOUNTER — Encounter: Payer: Self-pay | Admitting: Internal Medicine

## 2024-05-14 NOTE — Telephone Encounter (Signed)
 Noted.

## 2024-08-11 ENCOUNTER — Ambulatory Visit: Admitting: Dermatology

## 2024-08-16 ENCOUNTER — Other Ambulatory Visit

## 2024-08-19 ENCOUNTER — Ambulatory Visit: Admitting: Internal Medicine

## 2024-09-14 ENCOUNTER — Ambulatory Visit: Admitting: Dermatology

## 2024-09-28 ENCOUNTER — Ambulatory Visit: Admitting: Neurosurgery
